# Patient Record
Sex: Female | Born: 1945
Health system: Southern US, Community
[De-identification: ages and names within clinical notes are randomized; demographics above are authoritative.]

## PROBLEM LIST (undated history)

## (undated) DIAGNOSIS — I714 Abdominal aortic aneurysm, without rupture, unspecified: Secondary | ICD-10-CM

## (undated) DIAGNOSIS — L720 Epidermal cyst: Secondary | ICD-10-CM

## (undated) DIAGNOSIS — S32010A Wedge compression fracture of first lumbar vertebra, initial encounter for closed fracture: Secondary | ICD-10-CM

## (undated) DIAGNOSIS — M8580 Other specified disorders of bone density and structure, unspecified site: Secondary | ICD-10-CM

## (undated) DIAGNOSIS — I480 Paroxysmal atrial fibrillation: Secondary | ICD-10-CM

## (undated) DIAGNOSIS — K5790 Diverticulosis of intestine, part unspecified, without perforation or abscess without bleeding: Secondary | ICD-10-CM

## (undated) DIAGNOSIS — R42 Dizziness and giddiness: Secondary | ICD-10-CM

## (undated) DIAGNOSIS — Z7982 Long term (current) use of aspirin: Secondary | ICD-10-CM

## (undated) DIAGNOSIS — I739 Peripheral vascular disease, unspecified: Secondary | ICD-10-CM

## (undated) DIAGNOSIS — Z720 Tobacco use: Secondary | ICD-10-CM

## (undated) DIAGNOSIS — D649 Anemia, unspecified: Secondary | ICD-10-CM

## (undated) DIAGNOSIS — Z7901 Long term (current) use of anticoagulants: Secondary | ICD-10-CM

## (undated) DIAGNOSIS — J449 Chronic obstructive pulmonary disease, unspecified: Secondary | ICD-10-CM

## (undated) DIAGNOSIS — I10 Essential (primary) hypertension: Secondary | ICD-10-CM

## (undated) DIAGNOSIS — I2510 Atherosclerotic heart disease of native coronary artery without angina pectoris: Secondary | ICD-10-CM

## (undated) DIAGNOSIS — G4700 Insomnia, unspecified: Secondary | ICD-10-CM

## (undated) DIAGNOSIS — J439 Emphysema, unspecified: Secondary | ICD-10-CM

## (undated) DIAGNOSIS — H6980 Other specified disorders of Eustachian tube, unspecified ear: Secondary | ICD-10-CM

## (undated) DIAGNOSIS — R634 Abnormal weight loss: Secondary | ICD-10-CM

## (undated) DIAGNOSIS — R451 Restlessness and agitation: Secondary | ICD-10-CM

## (undated) DIAGNOSIS — Z7902 Long term (current) use of antithrombotics/antiplatelets: Secondary | ICD-10-CM

## (undated) DIAGNOSIS — I700 Atherosclerosis of aorta: Secondary | ICD-10-CM

## (undated) DIAGNOSIS — E876 Hypokalemia: Secondary | ICD-10-CM

## (undated) DIAGNOSIS — H6990 Unspecified Eustachian tube disorder, unspecified ear: Secondary | ICD-10-CM

## (undated) DIAGNOSIS — K219 Gastro-esophageal reflux disease without esophagitis: Secondary | ICD-10-CM

## (undated) DIAGNOSIS — M47812 Spondylosis without myelopathy or radiculopathy, cervical region: Secondary | ICD-10-CM

## (undated) DIAGNOSIS — R911 Solitary pulmonary nodule: Secondary | ICD-10-CM

## (undated) DIAGNOSIS — I609 Nontraumatic subarachnoid hemorrhage, unspecified: Secondary | ICD-10-CM

## (undated) DIAGNOSIS — M5030 Other cervical disc degeneration, unspecified cervical region: Secondary | ICD-10-CM

## (undated) DIAGNOSIS — H8103 Meniere's disease, bilateral: Secondary | ICD-10-CM

## (undated) DIAGNOSIS — E785 Hyperlipidemia, unspecified: Secondary | ICD-10-CM

## (undated) HISTORY — PX: EYE SURGERY: SHX253

## (undated) HISTORY — PX: CATARACT EXTRACTION W/ INTRAOCULAR LENS  IMPLANT, BILATERAL: SHX1307

## (undated) HISTORY — PX: ENDOVASCULAR STENT GRAFT (AAA): CATH118280

## (undated) HISTORY — PX: TONSILLECTOMY: SUR1361

---

## 2015-11-30 DIAGNOSIS — M5030 Other cervical disc degeneration, unspecified cervical region: Secondary | ICD-10-CM | POA: Insufficient documentation

## 2015-11-30 DIAGNOSIS — Z720 Tobacco use: Secondary | ICD-10-CM | POA: Insufficient documentation

## 2016-02-14 DIAGNOSIS — J438 Other emphysema: Secondary | ICD-10-CM | POA: Insufficient documentation

## 2016-02-14 DIAGNOSIS — R911 Solitary pulmonary nodule: Secondary | ICD-10-CM | POA: Insufficient documentation

## 2016-06-23 DIAGNOSIS — H8103 Meniere's disease, bilateral: Secondary | ICD-10-CM | POA: Insufficient documentation

## 2018-07-16 DIAGNOSIS — R42 Dizziness and giddiness: Secondary | ICD-10-CM | POA: Diagnosis not present

## 2018-07-16 DIAGNOSIS — H903 Sensorineural hearing loss, bilateral: Secondary | ICD-10-CM | POA: Diagnosis not present

## 2018-07-16 DIAGNOSIS — H8109 Meniere's disease, unspecified ear: Secondary | ICD-10-CM | POA: Diagnosis not present

## 2018-07-16 DIAGNOSIS — H6980 Other specified disorders of Eustachian tube, unspecified ear: Secondary | ICD-10-CM | POA: Diagnosis not present

## 2018-07-31 DIAGNOSIS — R42 Dizziness and giddiness: Secondary | ICD-10-CM | POA: Diagnosis not present

## 2018-07-31 DIAGNOSIS — H8109 Meniere's disease, unspecified ear: Secondary | ICD-10-CM | POA: Diagnosis not present

## 2018-08-07 DIAGNOSIS — R42 Dizziness and giddiness: Secondary | ICD-10-CM | POA: Diagnosis not present

## 2018-08-07 DIAGNOSIS — H8109 Meniere's disease, unspecified ear: Secondary | ICD-10-CM | POA: Diagnosis not present

## 2018-08-14 DIAGNOSIS — R42 Dizziness and giddiness: Secondary | ICD-10-CM | POA: Diagnosis not present

## 2018-08-14 DIAGNOSIS — H8109 Meniere's disease, unspecified ear: Secondary | ICD-10-CM | POA: Diagnosis not present

## 2018-08-20 DIAGNOSIS — H6980 Other specified disorders of Eustachian tube, unspecified ear: Secondary | ICD-10-CM | POA: Diagnosis not present

## 2018-08-20 DIAGNOSIS — R42 Dizziness and giddiness: Secondary | ICD-10-CM | POA: Diagnosis not present

## 2018-08-20 DIAGNOSIS — H903 Sensorineural hearing loss, bilateral: Secondary | ICD-10-CM | POA: Diagnosis not present

## 2018-08-21 DIAGNOSIS — H8109 Meniere's disease, unspecified ear: Secondary | ICD-10-CM | POA: Diagnosis not present

## 2018-08-21 DIAGNOSIS — R42 Dizziness and giddiness: Secondary | ICD-10-CM | POA: Diagnosis not present

## 2018-09-04 DIAGNOSIS — R42 Dizziness and giddiness: Secondary | ICD-10-CM | POA: Diagnosis not present

## 2018-09-04 DIAGNOSIS — H8109 Meniere's disease, unspecified ear: Secondary | ICD-10-CM | POA: Diagnosis not present

## 2018-09-08 ENCOUNTER — Encounter: Payer: Self-pay | Admitting: Emergency Medicine

## 2018-09-08 ENCOUNTER — Emergency Department
Admission: EM | Admit: 2018-09-08 | Discharge: 2018-09-08 | Disposition: A | Discharge status: Home / Self Care | Payer: Medicare HMO | Attending: Emergency Medicine | Admitting: Emergency Medicine

## 2018-09-08 ENCOUNTER — Other Ambulatory Visit: Payer: Self-pay

## 2018-09-08 DIAGNOSIS — R220 Localized swelling, mass and lump, head: Secondary | ICD-10-CM | POA: Diagnosis not present

## 2018-09-08 DIAGNOSIS — K047 Periapical abscess without sinus: Secondary | ICD-10-CM | POA: Diagnosis not present

## 2018-09-08 DIAGNOSIS — K0889 Other specified disorders of teeth and supporting structures: Secondary | ICD-10-CM | POA: Diagnosis present

## 2018-09-08 DIAGNOSIS — F17200 Nicotine dependence, unspecified, uncomplicated: Secondary | ICD-10-CM | POA: Insufficient documentation

## 2018-09-08 HISTORY — DX: Dizziness and giddiness: R42

## 2018-09-08 MED ORDER — AMOXICILLIN 500 MG PO CAPS: 500.0000 mg | ORAL_CAPSULE | Freq: Three times a day (TID) | ORAL | 0 refills | Status: DC

## 2018-09-08 MED ORDER — AMOXICILLIN 500 MG PO CAPS
500.0000 mg | ORAL_CAPSULE | Freq: Once | ORAL | Status: AC
  Administered 2018-09-08: 500 mg via ORAL
  Filled 2018-09-08: qty 1

## 2018-09-08 NOTE — ED Triage Notes (Signed)
Pt to ED via POV, pt states that yesterday she started to have dental pain and now has swelling on the right side of the face. Pt does not appear to be in any distress at this time.

## 2018-09-08 NOTE — Discharge Instructions (Addendum)
You are being treated for a superficial dental infection. Take the antibiotic as directed. Apply warm compresses to promote healing. Rinse with warm-salty water daily. Follow-up with your dental provider as needed.

## 2018-09-08 NOTE — ED Notes (Signed)
Pt  Has  Facial swelling toothache  Started yest  Pt reports  No injury    Speaking in complete sentances in no acute distress

## 2018-09-10 NOTE — ED Provider Notes (Signed)
[facility] Emergency Department Provider Note ____________________________________________  Time seen: 1136  I have reviewed the triage vital signs and the nursing notes.  HISTORY  Chief Complaint  Dental Pain and Facial Swelling  HPI Autumn Le is a 72 y.o. female who presents herself to the ED for evaluation of sudden facial swelling and preceding dental pain.  Patient describes she began to experience some mild pain to the buccal mucosa of her right upper incisor.  When she awoke this morning she has swelling from the upper lip to the nasal labial fold towards the eye.  She denies any interim fevers, chills, or sweats.  She does note tenderness and small area of swelling at the mouth under the upper lip.  She is unclear of the source of the infections citing she has had a root canal and a placed on his primary incisor.  Denies any difficulty breathing, swallowing, or controlling secretions.  Past Medical History:  Diagnosis Date  . Vertigo     There are no active problems to display for this patient.   Past Surgical History:  Procedure Laterality Date  . TONSILLECTOMY      Prior to Admission medications   Medication Sig Start Date End Date Taking? Authorizing Provider  amoxicillin (AMOXIL) 500 MG capsule Take 1 capsule (500 mg total) by mouth 3 (three) times daily. 09/08/18   Dontel Harshberger, Charlesetta Ivory, PA-C    Allergies Patient has no known allergies.  No family history on file.  Social History Social History   Tobacco Use  . Smoking status: Current Every Day Smoker  . Smokeless tobacco: Never Used  Substance Use Topics  . Alcohol use: Not Currently  . Drug use: Not Currently    Review of Systems  Constitutional: Negative for fever. Eyes: Negative for visual changes. ENT: Negative for sore throat.  Facial swelling and dental pain as above.   Cardiovascular: Negative for chest pain. Respiratory: Negative for shortness of  breath. Gastrointestinal: Negative for abdominal pain, vomiting and diarrhea. Genitourinary: Negative for dysuria. Musculoskeletal: Negative for back pain. Skin: Negative for rash. Neurological: Negative for headaches, focal weakness or numbness. ____________________________________________  PHYSICAL EXAM:  VITAL SIGNS: ED Triage Vitals  Enc Vitals Group     BP 09/08/18 1040 118/70     Pulse Rate 09/08/18 1038 66     Resp 09/08/18 1038 16     Temp 09/08/18 1038 98 F (36.7 C)     Temp Source 09/08/18 1038 Oral     SpO2 09/08/18 1038 97 %     Weight 09/08/18 1040 124 lb (56.2 kg)     Height 09/08/18 1040 5\' 4"  (1.626 m)     Head Circumference --      Peak Flow --      Pain Score 09/08/18 1039 4     Pain Loc --      Pain Edu? --      Excl. in GC? --     Constitutional: Alert and oriented. Well appearing and in no distress. Head: Normocephalic and atraumatic.  Patient with obvious swelling noted to the right upper lip and extending through the nasolabial fold on the right. Eyes: Conjunctivae are normal. PERRL. Normal extraocular movements Ears: Canals clear. TMs intact bilaterally. Nose: No congestion/rhinorrhea/epistaxis. Mouth/Throat: Mucous membranes are moist.  Uvula is midline and tonsils are flat.  No oropharyngeal lesions are appreciated.  Patient does have a focal pustule to the proximal buccal fold on the right upper lip, over  the primary incisor. Neck: Supple. No thyromegaly. Hematological/Lymphatic/Immunological: No cervical lymphadenopathy. Cardiovascular: Normal rate, regular rhythm. Normal distal pulses. Respiratory: Normal respiratory effort. No wheezes/rales/rhonchi. Musculoskeletal: Nontender with normal range of motion in all extremities.  Neurologic:  Normal gait without ataxia. Normal speech and language. No gross focal neurologic deficits are appreciated. Skin:  Skin is warm, dry and intact. No rash  noted. ____________________________________________  PROCEDURES  Procedures Amoxicillin 500 mg p.o. ____________________________________________  INITIAL IMPRESSION / ASSESSMENT AND PLAN / ED COURSE  Patient with ED evaluation of sudden facial swelling and dental pain.  Patient's clinical picture is consistent with a focal superficial dental abscess.  She is treated empirically with amoxicillin.  She is advised to return warm water, and follow-up with a dental provider for further evaluation and management.  Return precautions have been reviewed. ____________________________________________  FINAL CLINICAL IMPRESSION(S) / ED DIAGNOSES  Final diagnoses:  Dental infection      Karmen Stabs, Charlesetta Ivory, PA-C 09/11/18 0001    Dionne Bucy, MD 09/12/18 1110

## 2018-09-11 DIAGNOSIS — R42 Dizziness and giddiness: Secondary | ICD-10-CM | POA: Diagnosis not present

## 2018-09-11 DIAGNOSIS — H8109 Meniere's disease, unspecified ear: Secondary | ICD-10-CM | POA: Diagnosis not present

## 2018-09-17 DIAGNOSIS — R42 Dizziness and giddiness: Secondary | ICD-10-CM | POA: Diagnosis not present

## 2018-09-17 DIAGNOSIS — H6980 Other specified disorders of Eustachian tube, unspecified ear: Secondary | ICD-10-CM | POA: Diagnosis not present

## 2018-09-18 DIAGNOSIS — H8109 Meniere's disease, unspecified ear: Secondary | ICD-10-CM | POA: Diagnosis not present

## 2018-09-25 DIAGNOSIS — H8109 Meniere's disease, unspecified ear: Secondary | ICD-10-CM | POA: Diagnosis not present

## 2018-09-25 DIAGNOSIS — R42 Dizziness and giddiness: Secondary | ICD-10-CM | POA: Diagnosis not present

## 2018-10-01 DIAGNOSIS — R42 Dizziness and giddiness: Secondary | ICD-10-CM | POA: Diagnosis not present

## 2018-10-01 DIAGNOSIS — H8109 Meniere's disease, unspecified ear: Secondary | ICD-10-CM | POA: Diagnosis not present

## 2018-10-09 DIAGNOSIS — H8109 Meniere's disease, unspecified ear: Secondary | ICD-10-CM | POA: Diagnosis not present

## 2018-10-09 DIAGNOSIS — R42 Dizziness and giddiness: Secondary | ICD-10-CM | POA: Diagnosis not present

## 2018-10-21 DIAGNOSIS — R42 Dizziness and giddiness: Secondary | ICD-10-CM | POA: Diagnosis not present

## 2018-10-21 DIAGNOSIS — H6980 Other specified disorders of Eustachian tube, unspecified ear: Secondary | ICD-10-CM | POA: Diagnosis not present

## 2018-12-04 DIAGNOSIS — J209 Acute bronchitis, unspecified: Secondary | ICD-10-CM | POA: Diagnosis not present

## 2019-01-08 ENCOUNTER — Other Ambulatory Visit: Payer: Self-pay | Admitting: Family Medicine

## 2019-01-08 DIAGNOSIS — J438 Other emphysema: Secondary | ICD-10-CM

## 2019-01-08 DIAGNOSIS — Z136 Encounter for screening for cardiovascular disorders: Secondary | ICD-10-CM | POA: Diagnosis not present

## 2019-01-08 DIAGNOSIS — M8588 Other specified disorders of bone density and structure, other site: Secondary | ICD-10-CM | POA: Diagnosis not present

## 2019-01-08 DIAGNOSIS — R911 Solitary pulmonary nodule: Secondary | ICD-10-CM | POA: Diagnosis not present

## 2019-01-08 DIAGNOSIS — F17200 Nicotine dependence, unspecified, uncomplicated: Secondary | ICD-10-CM

## 2019-01-09 ENCOUNTER — Other Ambulatory Visit: Payer: Self-pay | Admitting: Family Medicine

## 2019-01-09 DIAGNOSIS — Z1231 Encounter for screening mammogram for malignant neoplasm of breast: Secondary | ICD-10-CM

## 2019-01-22 ENCOUNTER — Other Ambulatory Visit: Payer: Self-pay

## 2019-01-22 ENCOUNTER — Ambulatory Visit
Admission: RE | Admit: 2019-01-22 | Discharge: 2019-01-22 | Disposition: A | Discharge status: Home / Self Care | Payer: Medicare HMO | Source: Ambulatory Visit | Attending: Family Medicine | Admitting: Family Medicine

## 2019-01-22 DIAGNOSIS — Z1231 Encounter for screening mammogram for malignant neoplasm of breast: Secondary | ICD-10-CM | POA: Diagnosis not present

## 2019-01-29 DIAGNOSIS — L728 Other follicular cysts of the skin and subcutaneous tissue: Secondary | ICD-10-CM | POA: Diagnosis not present

## 2019-01-29 DIAGNOSIS — H73002 Acute myringitis, left ear: Secondary | ICD-10-CM | POA: Diagnosis not present

## 2019-01-31 ENCOUNTER — Ambulatory Visit: Payer: Medicare HMO

## 2019-02-05 ENCOUNTER — Ambulatory Visit: Payer: Medicare HMO

## 2019-02-12 DIAGNOSIS — L728 Other follicular cysts of the skin and subcutaneous tissue: Secondary | ICD-10-CM | POA: Diagnosis not present

## 2019-02-12 DIAGNOSIS — H73009 Acute myringitis, unspecified ear: Secondary | ICD-10-CM | POA: Diagnosis not present

## 2019-04-08 ENCOUNTER — Ambulatory Visit
Admission: RE | Admit: 2019-04-08 | Discharge: 2019-04-08 | Disposition: A | Discharge status: Home / Self Care | Payer: Medicare HMO | Source: Ambulatory Visit | Attending: Family Medicine | Admitting: Family Medicine

## 2019-04-08 ENCOUNTER — Other Ambulatory Visit: Payer: Self-pay

## 2019-04-08 DIAGNOSIS — J438 Other emphysema: Secondary | ICD-10-CM | POA: Diagnosis not present

## 2019-04-08 DIAGNOSIS — R911 Solitary pulmonary nodule: Secondary | ICD-10-CM | POA: Diagnosis not present

## 2019-04-08 DIAGNOSIS — F17200 Nicotine dependence, unspecified, uncomplicated: Secondary | ICD-10-CM | POA: Diagnosis not present

## 2019-07-02 DIAGNOSIS — Z1159 Encounter for screening for other viral diseases: Secondary | ICD-10-CM | POA: Diagnosis not present

## 2019-07-02 DIAGNOSIS — Z0000 Encounter for general adult medical examination without abnormal findings: Secondary | ICD-10-CM | POA: Diagnosis not present

## 2019-07-02 DIAGNOSIS — F17200 Nicotine dependence, unspecified, uncomplicated: Secondary | ICD-10-CM | POA: Diagnosis not present

## 2019-07-02 DIAGNOSIS — J438 Other emphysema: Secondary | ICD-10-CM | POA: Diagnosis not present

## 2019-07-02 DIAGNOSIS — R911 Solitary pulmonary nodule: Secondary | ICD-10-CM | POA: Diagnosis not present

## 2019-07-02 DIAGNOSIS — E782 Mixed hyperlipidemia: Secondary | ICD-10-CM | POA: Diagnosis not present

## 2019-07-02 DIAGNOSIS — Z136 Encounter for screening for cardiovascular disorders: Secondary | ICD-10-CM | POA: Diagnosis not present

## 2019-07-09 DIAGNOSIS — Z1159 Encounter for screening for other viral diseases: Secondary | ICD-10-CM | POA: Diagnosis not present

## 2019-07-09 DIAGNOSIS — E782 Mixed hyperlipidemia: Secondary | ICD-10-CM | POA: Diagnosis not present

## 2019-07-09 DIAGNOSIS — Z0000 Encounter for general adult medical examination without abnormal findings: Secondary | ICD-10-CM | POA: Diagnosis not present

## 2019-07-09 DIAGNOSIS — Z66 Do not resuscitate: Secondary | ICD-10-CM | POA: Insufficient documentation

## 2019-08-12 DIAGNOSIS — R202 Paresthesia of skin: Secondary | ICD-10-CM | POA: Diagnosis not present

## 2019-09-11 DIAGNOSIS — H524 Presbyopia: Secondary | ICD-10-CM | POA: Diagnosis not present

## 2019-09-11 DIAGNOSIS — Z0100 Encounter for examination of eyes and vision without abnormal findings: Secondary | ICD-10-CM | POA: Diagnosis not present

## 2019-09-17 ENCOUNTER — Other Ambulatory Visit: Payer: Self-pay | Admitting: Family Medicine

## 2019-09-17 ENCOUNTER — Other Ambulatory Visit (HOSPITAL_COMMUNITY): Payer: Self-pay | Admitting: Family Medicine

## 2019-09-17 DIAGNOSIS — R911 Solitary pulmonary nodule: Secondary | ICD-10-CM

## 2019-11-03 DIAGNOSIS — E782 Mixed hyperlipidemia: Secondary | ICD-10-CM | POA: Diagnosis not present

## 2019-11-10 DIAGNOSIS — E782 Mixed hyperlipidemia: Secondary | ICD-10-CM | POA: Diagnosis not present

## 2019-11-10 DIAGNOSIS — J438 Other emphysema: Secondary | ICD-10-CM | POA: Diagnosis not present

## 2019-11-10 DIAGNOSIS — F17200 Nicotine dependence, unspecified, uncomplicated: Secondary | ICD-10-CM | POA: Diagnosis not present

## 2019-11-14 HISTORY — PX: BRAIN SURGERY: SHX531

## 2019-12-18 ENCOUNTER — Other Ambulatory Visit: Payer: Self-pay | Admitting: Family Medicine

## 2019-12-18 DIAGNOSIS — Z1231 Encounter for screening mammogram for malignant neoplasm of breast: Secondary | ICD-10-CM

## 2020-01-23 ENCOUNTER — Emergency Department: Payer: Medicare HMO

## 2020-01-23 ENCOUNTER — Encounter (HOSPITAL_COMMUNITY)
Admission: EM | Disposition: A | Discharge status: Rehabilitation Facility | Payer: Self-pay | Source: Other Acute Inpatient Hospital | Attending: Neurosurgery

## 2020-01-23 ENCOUNTER — Inpatient Hospital Stay (HOSPITAL_COMMUNITY)
Admission: EM | Admit: 2020-01-23 | Discharge: 2020-01-31 | DRG: 022 | Disposition: A | Discharge status: Rehabilitation Facility | Payer: Medicare HMO | Source: Other Acute Inpatient Hospital | Attending: Neurosurgery | Admitting: Neurosurgery

## 2020-01-23 ENCOUNTER — Emergency Department (HOSPITAL_COMMUNITY): Payer: Medicare HMO | Admitting: Registered Nurse

## 2020-01-23 ENCOUNTER — Encounter (HOSPITAL_COMMUNITY): Payer: Self-pay

## 2020-01-23 ENCOUNTER — Emergency Department
Admission: EM | Admit: 2020-01-23 | Discharge: 2020-01-23 | Disposition: A | Discharge status: Other Acute Inpatient Hospital | Payer: Medicare HMO | Attending: Emergency Medicine | Admitting: Emergency Medicine

## 2020-01-23 ENCOUNTER — Emergency Department (HOSPITAL_COMMUNITY): Payer: Medicare HMO

## 2020-01-23 ENCOUNTER — Other Ambulatory Visit: Payer: Self-pay

## 2020-01-23 DIAGNOSIS — S40012A Contusion of left shoulder, initial encounter: Secondary | ICD-10-CM | POA: Diagnosis present

## 2020-01-23 DIAGNOSIS — I607 Nontraumatic subarachnoid hemorrhage from unspecified intracranial artery: Secondary | ICD-10-CM | POA: Diagnosis not present

## 2020-01-23 DIAGNOSIS — R9431 Abnormal electrocardiogram [ECG] [EKG]: Secondary | ICD-10-CM | POA: Diagnosis not present

## 2020-01-23 DIAGNOSIS — M542 Cervicalgia: Secondary | ICD-10-CM | POA: Diagnosis not present

## 2020-01-23 DIAGNOSIS — D72829 Elevated white blood cell count, unspecified: Secondary | ICD-10-CM | POA: Diagnosis not present

## 2020-01-23 DIAGNOSIS — R7989 Other specified abnormal findings of blood chemistry: Secondary | ICD-10-CM

## 2020-01-23 DIAGNOSIS — E876 Hypokalemia: Secondary | ICD-10-CM | POA: Diagnosis not present

## 2020-01-23 DIAGNOSIS — I10 Essential (primary) hypertension: Secondary | ICD-10-CM | POA: Diagnosis not present

## 2020-01-23 DIAGNOSIS — I609 Nontraumatic subarachnoid hemorrhage, unspecified: Secondary | ICD-10-CM

## 2020-01-23 DIAGNOSIS — W19XXXA Unspecified fall, initial encounter: Secondary | ICD-10-CM | POA: Insufficient documentation

## 2020-01-23 DIAGNOSIS — R9430 Abnormal result of cardiovascular function study, unspecified: Secondary | ICD-10-CM | POA: Insufficient documentation

## 2020-01-23 DIAGNOSIS — I671 Cerebral aneurysm, nonruptured: Secondary | ICD-10-CM | POA: Diagnosis present

## 2020-01-23 DIAGNOSIS — I6523 Occlusion and stenosis of bilateral carotid arteries: Secondary | ICD-10-CM | POA: Diagnosis not present

## 2020-01-23 DIAGNOSIS — R531 Weakness: Secondary | ICD-10-CM | POA: Diagnosis not present

## 2020-01-23 DIAGNOSIS — F17210 Nicotine dependence, cigarettes, uncomplicated: Secondary | ICD-10-CM | POA: Diagnosis present

## 2020-01-23 DIAGNOSIS — Y999 Unspecified external cause status: Secondary | ICD-10-CM | POA: Diagnosis not present

## 2020-01-23 DIAGNOSIS — R778 Other specified abnormalities of plasma proteins: Secondary | ICD-10-CM | POA: Diagnosis not present

## 2020-01-23 DIAGNOSIS — D62 Acute posthemorrhagic anemia: Secondary | ICD-10-CM | POA: Diagnosis not present

## 2020-01-23 DIAGNOSIS — Z7951 Long term (current) use of inhaled steroids: Secondary | ICD-10-CM

## 2020-01-23 DIAGNOSIS — I6031 Nontraumatic subarachnoid hemorrhage from right posterior communicating artery: Secondary | ICD-10-CM

## 2020-01-23 DIAGNOSIS — Z20822 Contact with and (suspected) exposure to covid-19: Secondary | ICD-10-CM | POA: Diagnosis present

## 2020-01-23 DIAGNOSIS — Z79899 Other long term (current) drug therapy: Secondary | ICD-10-CM | POA: Diagnosis not present

## 2020-01-23 DIAGNOSIS — J449 Chronic obstructive pulmonary disease, unspecified: Secondary | ICD-10-CM | POA: Diagnosis present

## 2020-01-23 DIAGNOSIS — R451 Restlessness and agitation: Secondary | ICD-10-CM | POA: Diagnosis not present

## 2020-01-23 DIAGNOSIS — R0602 Shortness of breath: Secondary | ICD-10-CM | POA: Diagnosis not present

## 2020-01-23 DIAGNOSIS — S199XXA Unspecified injury of neck, initial encounter: Secondary | ICD-10-CM | POA: Diagnosis not present

## 2020-01-23 DIAGNOSIS — R55 Syncope and collapse: Secondary | ICD-10-CM

## 2020-01-23 DIAGNOSIS — R42 Dizziness and giddiness: Secondary | ICD-10-CM | POA: Diagnosis not present

## 2020-01-23 DIAGNOSIS — R5381 Other malaise: Secondary | ICD-10-CM | POA: Diagnosis not present

## 2020-01-23 DIAGNOSIS — I69098 Other sequelae following nontraumatic subarachnoid hemorrhage: Secondary | ICD-10-CM | POA: Diagnosis not present

## 2020-01-23 DIAGNOSIS — Y9389 Activity, other specified: Secondary | ICD-10-CM | POA: Diagnosis not present

## 2020-01-23 DIAGNOSIS — Y929 Unspecified place or not applicable: Secondary | ICD-10-CM | POA: Diagnosis not present

## 2020-01-23 DIAGNOSIS — F17200 Nicotine dependence, unspecified, uncomplicated: Secondary | ICD-10-CM | POA: Insufficient documentation

## 2020-01-23 DIAGNOSIS — S50311A Abrasion of right elbow, initial encounter: Secondary | ICD-10-CM | POA: Diagnosis not present

## 2020-01-23 DIAGNOSIS — I952 Hypotension due to drugs: Secondary | ICD-10-CM | POA: Diagnosis not present

## 2020-01-23 DIAGNOSIS — S066X9A Traumatic subarachnoid hemorrhage with loss of consciousness of unspecified duration, initial encounter: Secondary | ICD-10-CM | POA: Insufficient documentation

## 2020-01-23 DIAGNOSIS — S50312A Abrasion of left elbow, initial encounter: Secondary | ICD-10-CM | POA: Diagnosis not present

## 2020-01-23 DIAGNOSIS — I714 Abdominal aortic aneurysm, without rupture: Secondary | ICD-10-CM | POA: Diagnosis not present

## 2020-01-23 DIAGNOSIS — S066X0A Traumatic subarachnoid hemorrhage without loss of consciousness, initial encounter: Secondary | ICD-10-CM | POA: Diagnosis not present

## 2020-01-23 HISTORY — PX: IR ANGIOGRAM FOLLOW UP STUDY: IMG697

## 2020-01-23 HISTORY — PX: IR TRANSCATH/EMBOLIZ: IMG695

## 2020-01-23 HISTORY — PX: IR ANGIO VERTEBRAL SEL VERTEBRAL UNI L MOD SED: IMG5367

## 2020-01-23 HISTORY — DX: Meniere's disease, bilateral: H81.03

## 2020-01-23 HISTORY — DX: Nontraumatic subarachnoid hemorrhage from right posterior communicating artery: I60.31

## 2020-01-23 HISTORY — DX: Spondylosis without myelopathy or radiculopathy, cervical region: M47.812

## 2020-01-23 HISTORY — DX: Chronic obstructive pulmonary disease, unspecified: J44.9

## 2020-01-23 HISTORY — DX: Cerebral aneurysm, nonruptured: I67.1

## 2020-01-23 HISTORY — PX: IR ANGIO INTRA EXTRACRAN SEL INTERNAL CAROTID BILAT MOD SED: IMG5363

## 2020-01-23 HISTORY — PX: RADIOLOGY WITH ANESTHESIA: SHX6223

## 2020-01-23 HISTORY — DX: Other specified disorders of bone density and structure, unspecified site: M85.80

## 2020-01-23 LAB — URINALYSIS, COMPLETE (UACMP) WITH MICROSCOPIC
Bacteria, UA: NONE SEEN
Bilirubin Urine: NEGATIVE
Glucose, UA: NEGATIVE mg/dL
Ketones, ur: 20 mg/dL — AB
Nitrite: NEGATIVE
Protein, ur: NEGATIVE mg/dL
Specific Gravity, Urine: 1.04 — ABNORMAL HIGH (ref 1.005–1.030)
pH: 6 (ref 5.0–8.0)

## 2020-01-23 LAB — CBC
HCT: 38.5 % (ref 36.0–46.0)
HCT: 32.2 % — ABNORMAL LOW (ref 36.0–46.0)
Hemoglobin: 12.7 g/dL (ref 12.0–15.0)
Hemoglobin: 10.7 g/dL — ABNORMAL LOW (ref 12.0–15.0)
MCH: 31.7 pg (ref 26.0–34.0)
MCH: 32.5 pg (ref 26.0–34.0)
MCHC: 33 g/dL (ref 30.0–36.0)
MCHC: 33.2 g/dL (ref 30.0–36.0)
MCV: 96 fL (ref 80.0–100.0)
MCV: 97.9 fL (ref 80.0–100.0)
Platelets: 255 10*3/uL (ref 150–400)
Platelets: 217 10*3/uL (ref 150–400)
RBC: 4.01 MIL/uL (ref 3.87–5.11)
RBC: 3.29 MIL/uL — ABNORMAL LOW (ref 3.87–5.11)
RDW: 13.2 % (ref 11.5–15.5)
RDW: 13.2 % (ref 11.5–15.5)
WBC: 14.2 10*3/uL — ABNORMAL HIGH (ref 4.0–10.5)
WBC: 12.1 10*3/uL — ABNORMAL HIGH (ref 4.0–10.5)
nRBC: 0 % (ref 0.0–0.2)
nRBC: 0 % (ref 0.0–0.2)

## 2020-01-23 LAB — BASIC METABOLIC PANEL
Anion gap: 10 (ref 5–15)
BUN: 20 mg/dL (ref 8–23)
CO2: 20 mmol/L — ABNORMAL LOW (ref 22–32)
Calcium: 8.7 mg/dL — ABNORMAL LOW (ref 8.9–10.3)
Chloride: 105 mmol/L (ref 98–111)
Creatinine, Ser: 0.57 mg/dL (ref 0.44–1.00)
GFR calc Af Amer: >60 mL/min (ref >60)
GFR calc non Af Amer: >60 mL/min (ref >60)
Glucose, Bld: 140 mg/dL — ABNORMAL HIGH (ref 70–99)
Potassium: 3.6 mmol/L (ref 3.5–5.1)
Sodium: 135 mmol/L (ref 135–145)

## 2020-01-23 LAB — HEPATIC FUNCTION PANEL
ALT: 14 U/L (ref 0–44)
AST: 37 U/L (ref 15–41)
Albumin: 3.6 g/dL (ref 3.5–5.0)
Alkaline Phosphatase: 100 U/L (ref 38–126)
Bilirubin, Direct: 0.2 mg/dL (ref 0.0–0.2)
Indirect Bilirubin: 0.5 mg/dL (ref 0.3–0.9)
Total Bilirubin: 0.7 mg/dL (ref 0.3–1.2)
Total Protein: 7 g/dL (ref 6.5–8.1)

## 2020-01-23 LAB — TYPE AND SCREEN
ABO/RH(D): A POS
Antibody Screen: NEGATIVE

## 2020-01-23 LAB — TROPONIN I (HIGH SENSITIVITY)
Troponin I (High Sensitivity): 474 ng/L (ref <18)
Troponin I (High Sensitivity): 472 ng/L (ref <18)

## 2020-01-23 LAB — RESPIRATORY PANEL BY RT PCR (FLU A&B, COVID)
Influenza A by PCR: NEGATIVE
Influenza B by PCR: NEGATIVE
SARS Coronavirus 2 by RT PCR: NEGATIVE

## 2020-01-23 LAB — ABO/RH: ABO/RH(D): A POS

## 2020-01-23 LAB — APTT: aPTT: 49 seconds — ABNORMAL HIGH (ref 24–36)

## 2020-01-23 LAB — PROTIME-INR
INR: 1.1 (ref 0.8–1.2)
Prothrombin Time: 14.3 seconds (ref 11.4–15.2)

## 2020-01-23 LAB — MRSA PCR SCREENING: MRSA by PCR: NEGATIVE

## 2020-01-23 LAB — CK: Total CK: 458 U/L — ABNORMAL HIGH (ref 38–234)

## 2020-01-23 SURGERY — RADIOLOGY WITH ANESTHESIA
Anesthesia: General

## 2020-01-23 MED ORDER — SODIUM CHLORIDE 0.9 % IV BOLUS
1000.0000 mL | Freq: Once | INTRAVENOUS | Status: AC
  Administered 2020-01-23: 1000 mL via INTRAVENOUS

## 2020-01-23 MED ORDER — ACETAMINOPHEN 500 MG PO TABS
1000.0000 mg | ORAL_TABLET | Freq: Once | ORAL | Status: AC
  Administered 2020-01-23: 1000 mg via ORAL
  Filled 2020-01-23: qty 2

## 2020-01-23 MED ORDER — NICARDIPINE HCL IN NACL 20-0.86 MG/200ML-% IV SOLN
0.0000 mg/h | INTRAVENOUS | Status: DC
  Filled 2020-01-23: qty 200

## 2020-01-23 MED ORDER — NIMODIPINE 30 MG PO CAPS
60.0000 mg | ORAL_CAPSULE | Freq: Once | ORAL | Status: AC
  Administered 2020-01-23: 60 mg via ORAL
  Filled 2020-01-23: qty 2

## 2020-01-23 MED ORDER — CHLORHEXIDINE GLUCONATE CLOTH 2 % EX PADS
6.0000 | MEDICATED_PAD | Freq: Every day | CUTANEOUS | Status: DC
  Administered 2020-01-23 – 2020-01-30 (×6): 6 via TOPICAL

## 2020-01-23 MED ORDER — CEFAZOLIN SODIUM-DEXTROSE 2-3 GM-%(50ML) IV SOLR
INTRAVENOUS | Status: DC | PRN
  Administered 2020-01-23: 2 g via INTRAVENOUS

## 2020-01-23 MED ORDER — ONDANSETRON HCL 4 MG/2ML IJ SOLN
INTRAMUSCULAR | Status: DC | PRN
  Administered 2020-01-23: 4 mg via INTRAVENOUS

## 2020-01-23 MED ORDER — DEXAMETHASONE SODIUM PHOSPHATE 10 MG/ML IJ SOLN
INTRAMUSCULAR | Status: DC | PRN
  Administered 2020-01-23: 5 mg via INTRAVENOUS

## 2020-01-23 MED ORDER — NIMODIPINE 30 MG PO CAPS
60.0000 mg | ORAL_CAPSULE | ORAL | Status: DC
  Administered 2020-01-23 – 2020-01-25 (×12): 60 mg via ORAL
  Filled 2020-01-23 (×11): qty 2

## 2020-01-23 MED ORDER — LORATADINE 10 MG PO TABS
10.0000 mg | ORAL_TABLET | Freq: Every day | ORAL | Status: DC
  Administered 2020-01-24 – 2020-01-30 (×7): 10 mg via ORAL
  Filled 2020-01-23 (×7): qty 1

## 2020-01-23 MED ORDER — ONDANSETRON 4 MG PO TBDP
4.0000 mg | ORAL_TABLET | Freq: Four times a day (QID) | ORAL | Status: DC | PRN
  Administered 2020-01-27: 4 mg via ORAL
  Filled 2020-01-23: qty 1

## 2020-01-23 MED ORDER — FLUTICASONE FUROATE-VILANTEROL 100-25 MCG/INH IN AEPB
1.0000 | INHALATION_SPRAY | Freq: Every day | RESPIRATORY_TRACT | Status: DC
  Administered 2020-01-24 – 2020-01-31 (×7): 1 via RESPIRATORY_TRACT
  Filled 2020-01-23: qty 28

## 2020-01-23 MED ORDER — PANTOPRAZOLE SODIUM 40 MG PO PACK: 40.0000 mg | PACK | Freq: Every day | ORAL | Status: DC

## 2020-01-23 MED ORDER — FENTANYL CITRATE (PF) 250 MCG/5ML IJ SOLN
INTRAMUSCULAR | Status: DC | PRN
  Administered 2020-01-23: 100 ug via INTRAVENOUS

## 2020-01-23 MED ORDER — ROCURONIUM BROMIDE 10 MG/ML (PF) SYRINGE
PREFILLED_SYRINGE | INTRAVENOUS | Status: DC | PRN
  Administered 2020-01-23: 50 mg via INTRAVENOUS

## 2020-01-23 MED ORDER — LIDOCAINE 2% (20 MG/ML) 5 ML SYRINGE
INTRAMUSCULAR | Status: DC | PRN
  Administered 2020-01-23: 40 mg via INTRAVENOUS

## 2020-01-23 MED ORDER — LABETALOL HCL 5 MG/ML IV SOLN
20.0000 mg | Freq: Once | INTRAVENOUS | Status: AC
  Administered 2020-01-23: 20 mg via INTRAVENOUS
  Filled 2020-01-23: qty 4

## 2020-01-23 MED ORDER — ACETAMINOPHEN 325 MG PO TABS
650.0000 mg | ORAL_TABLET | ORAL | Status: DC | PRN
  Administered 2020-01-23 – 2020-01-26 (×4): 650 mg via ORAL
  Filled 2020-01-23 (×5): qty 2

## 2020-01-23 MED ORDER — NIMODIPINE 6 MG/ML PO SOLN
60.0000 mg | ORAL | Status: DC
  Filled 2020-01-23: qty 10

## 2020-01-23 MED ORDER — VITAMIN D 25 MCG (1000 UNIT) PO TABS
2000.0000 [IU] | ORAL_TABLET | Freq: Every day | ORAL | Status: DC
  Administered 2020-01-24 – 2020-01-30 (×7): 2000 [IU] via ORAL
  Filled 2020-01-23 (×7): qty 2

## 2020-01-23 MED ORDER — SUGAMMADEX SODIUM 200 MG/2ML IV SOLN
INTRAVENOUS | Status: DC | PRN
  Administered 2020-01-23 (×2): 100 mg via INTRAVENOUS

## 2020-01-23 MED ORDER — STROKE: EARLY STAGES OF RECOVERY BOOK
Freq: Once | Status: DC
  Filled 2020-01-23: qty 1

## 2020-01-23 MED ORDER — LACTATED RINGERS IV SOLN: INTRAVENOUS | Status: DC | PRN

## 2020-01-23 MED ORDER — PHENYLEPHRINE HCL-NACL 10-0.9 MG/250ML-% IV SOLN
INTRAVENOUS | Status: DC | PRN
  Administered 2020-01-23: 10 ug/min via INTRAVENOUS

## 2020-01-23 MED ORDER — ONDANSETRON HCL 4 MG/2ML IJ SOLN
4.0000 mg | Freq: Four times a day (QID) | INTRAMUSCULAR | Status: DC | PRN
  Administered 2020-01-26 – 2020-01-31 (×2): 4 mg via INTRAVENOUS
  Filled 2020-01-23 (×2): qty 2

## 2020-01-23 MED ORDER — PHENYLEPHRINE HCL-NACL 10-0.9 MG/250ML-% IV SOLN: INTRAVENOUS | Status: DC | PRN

## 2020-01-23 MED ORDER — ALBUTEROL SULFATE (2.5 MG/3ML) 0.083% IN NEBU: 2.5000 mg | INHALATION_SOLUTION | Freq: Four times a day (QID) | RESPIRATORY_TRACT | Status: DC | PRN

## 2020-01-23 MED ORDER — LACTATED RINGERS IV SOLN: INTRAVENOUS | Status: DC

## 2020-01-23 MED ORDER — IOHEXOL 300 MG/ML  SOLN
100.0000 mL | Freq: Once | INTRAMUSCULAR | Status: AC | PRN
  Administered 2020-01-23: 60 mL via INTRA_ARTERIAL

## 2020-01-23 MED ORDER — FLUTICASONE PROPIONATE 50 MCG/ACT NA SUSP
2.0000 | Freq: Every day | NASAL | Status: DC
  Administered 2020-01-24 – 2020-01-31 (×7): 2 via NASAL
  Filled 2020-01-23: qty 16

## 2020-01-23 MED ORDER — CALCIUM CARBONATE 1250 (500 CA) MG PO TABS
500.0000 mg | ORAL_TABLET | Freq: Every day | ORAL | Status: DC
  Administered 2020-01-24 – 2020-01-30 (×7): 500 mg via ORAL
  Filled 2020-01-23 (×10): qty 1

## 2020-01-23 MED ORDER — ACETAMINOPHEN 650 MG RE SUPP: 650.0000 mg | RECTAL | Status: DC | PRN

## 2020-01-23 MED ORDER — PANTOPRAZOLE SODIUM 40 MG PO TBEC
40.0000 mg | DELAYED_RELEASE_TABLET | Freq: Every day | ORAL | Status: DC
  Administered 2020-01-24 – 2020-01-31 (×8): 40 mg via ORAL
  Filled 2020-01-23 (×8): qty 1

## 2020-01-23 MED ORDER — CEFAZOLIN SODIUM-DEXTROSE 2-4 GM/100ML-% IV SOLN
INTRAVENOUS | Status: AC
  Filled 2020-01-23: qty 100

## 2020-01-23 MED ORDER — SODIUM CHLORIDE 0.9 % IV SOLN: INTRAVENOUS | Status: DC

## 2020-01-23 MED ORDER — PROPOFOL 10 MG/ML IV BOLUS
INTRAVENOUS | Status: DC | PRN
  Administered 2020-01-23: 90 mg via INTRAVENOUS

## 2020-01-23 MED ORDER — IOHEXOL 350 MG/ML SOLN
75.0000 mL | Freq: Once | INTRAVENOUS | Status: AC | PRN
  Administered 2020-01-23: 75 mL via INTRAVENOUS

## 2020-01-23 MED ORDER — ACETAMINOPHEN 160 MG/5ML PO SOLN: 650.0000 mg | ORAL | Status: DC | PRN

## 2020-01-23 MED ORDER — DOCUSATE SODIUM 100 MG PO CAPS
100.0000 mg | ORAL_CAPSULE | Freq: Two times a day (BID) | ORAL | Status: DC
  Administered 2020-01-23 – 2020-01-31 (×15): 100 mg via ORAL
  Filled 2020-01-23 (×15): qty 1

## 2020-01-23 NOTE — ED Provider Notes (Signed)
[facility]GENCY [facility] Provider Note   CSN: [id number] Arrival date & time: 01/23/20  1414     History Chief Complaint  Patient presents with  . Neurologic Problem    Nelda MarseilleRegina M Wedig is a 74 y.o. female.  Patient is a 74 year old female who presents as a transfer [facility] after having an noted subarachnoid hemorrhage on CT with associated aneurysm.  She had presented to the ED after a fall.  She does not remember the fall.  She says that she woke up on her bed yesterday morning with abrasions to her elbows and bruising to her shoulder.  She is not sure what happened.  She tells me that he came into the emergency room today because her friend made her come.  She has a mild headache and some nausea but otherwise denies any symptoms.  She denies any pain or injuries from the fall.  She has some abrasions to her elbows and bruising to her left shoulder but denies any underlying pain in these areas.  She was noted to have an elevated troponin but denies any chest pain or shortness of breath.        Past Medical History:  Diagnosis Date  . Vertigo     There are no problems to display for this patient.   Past Surgical History:  Procedure Laterality Date  . TONSILLECTOMY       OB History   No obstetric history on file.     Family History  Problem Relation Age of Onset  . Breast cancer Maternal Aunt     Social History   Tobacco Use  . Smoking status: Current Every Day Smoker  . Smokeless tobacco: Never Used  Substance Use Topics  . Alcohol use: Not Currently  . Drug use: Not Currently    Home Medications Prior to Admission medications   Medication Sig Start Date End Date Taking? Authorizing Provider  acetaminophen (TYLENOL) 325 MG tablet Take 650 mg by mouth every 6 (six) hours as needed for headache (pain).   Yes [provider]  albuterol (VENTOLIN HFA) 108 (90 Base) MCG/ACT inhaler Inhale 2 puffs into  the lungs every 6 (six) hours as needed for wheezing or shortness of breath.  06/24/18  Yes [provider]  Calcium Carbonate (CALCIUM 600 PO) Take 600 mg by mouth at bedtime.   Yes [provider]  Cholecalciferol 50 MCG (2000 UT) CAPS Take 2,000 Units by mouth at bedtime.    Yes [provider]  fluticasone (FLONASE) 50 MCG/ACT nasal spray Place 2 sprays into both nostrils daily.  09/09/16  Yes [provider]  fluticasone furoate-vilanterol (BREO ELLIPTA) 100-25 MCG/INH AEPB Inhale 1 puff into the lungs daily.   Yes [provider]  loratadine (CLARITIN) 10 MG tablet Take 10 mg by mouth at bedtime.  08/12/16  Yes [provider]    Allergies    Patient has no known allergies.  Review of Systems   Review of Systems  Constitutional: Positive for fatigue. Negative for chills, diaphoresis and fever.  HENT: Negative for congestion, rhinorrhea and sneezing.   Eyes: Negative.   Respiratory: Negative for cough, chest tightness and shortness of breath.   Cardiovascular: Negative for chest pain and leg swelling.  Gastrointestinal: Positive for nausea. Negative for abdominal pain, blood in stool, diarrhea and vomiting.  Genitourinary: Negative for difficulty urinating, flank pain, frequency and hematuria.  Musculoskeletal: Negative for arthralgias and back pain.  Skin: Positive for  wound. Negative for rash.  Neurological: Positive for headaches. Negative for dizziness, speech difficulty, weakness and numbness.    Physical Exam Updated Vital Signs BP (!) 122/57   Pulse 63   Temp (!) 97.3 F (36.3 C) (Oral)   Resp (!) 21   Ht 5\' 4"  (1.626 m)   Wt 55.3 kg   SpO2 98%   BMI 20.94 kg/m   Physical Exam Constitutional:      Appearance: She is well-developed.  HENT:     Head: Normocephalic and atraumatic.  Cardiovascular:     Rate and Rhythm: Normal rate and regular rhythm.     Heart sounds: Normal heart sounds.  Pulmonary:      Effort: Pulmonary effort is normal. No respiratory distress.     Breath sounds: Normal breath sounds. No wheezing or rales.  Chest:     Chest wall: No tenderness.  Abdominal:     General: Bowel sounds are normal.     Palpations: Abdomen is soft.     Tenderness: There is no abdominal tenderness. There is no guarding or rebound.  Musculoskeletal:        General: Normal range of motion.     Cervical back: Normal range of motion and neck supple.     Comments: She has some abrasions to her left elbow and some ecchymosis to her left shoulder.  No underlying bony tenderness  Lymphadenopathy:     Cervical: No cervical adenopathy.  Skin:    General: Skin is warm and dry.     Findings: No rash.  Neurological:     General: No focal deficit present.     Mental Status: She is alert and oriented to person, place, and time.     ED Results / Procedures / Treatments   Labs (all labs ordered are listed, but only abnormal results are displayed) Labs Reviewed - No data to display  EKG None  Radiology CT Angio Head W or Wo Contrast  Result Date: 01/23/2020 CLINICAL DATA:  Dizziness EXAM: CT ANGIOGRAPHY HEAD AND NECK TECHNIQUE: Multidetector CT imaging of the head and neck was performed using the standard protocol during bolus administration of intravenous contrast. Multiplanar CT image reconstructions and MIPs were obtained to evaluate the vascular anatomy. Carotid stenosis measurements (when applicable) are obtained utilizing NASCET criteria, using the distal internal carotid diameter as the denominator. CONTRAST:  5mL OMNIPAQUE IOHEXOL 350 MG/ML SOLN COMPARISON:  None. FINDINGS: CTA NECK FINDINGS Aortic arch: Calcified and irregular noncalcified plaque is present along the aortic arch and at the patent great vessel origins. Right carotid system: Patent. Calcified and noncalcified plaque at the ICA origin causes less than 50% stenosis. Left carotid system: Patent. Mild to moderate stenosis of the  common carotid origin. Atherosclerotic wall thickening along the remainder of the common carotid. Calcified and noncalcified plaque at the bifurcation and along the proximal carotid with less than 50% stenosis. Vertebral arteries: Patent.  Left vertebral artery is dominant. Skeleton: Cervical spine dictated separately. Other neck: No mass or adenopathy. Upper chest: Emphysema. Review of the MIP images confirms the above findings CTA HEAD FINDINGS Anterior circulation: Intracranial internal carotid arteries are patent. Anterior and middle cerebral arteries are patent. There is in inferolaterally directed aneurysm of the distal supraclinoid right ICA likely contiguous with the posterior communicating artery origin measuring approximately 8.6 x 5.3 mm axially. Neck measures 2.8 mm. Second aneurysm of the right A2 ACA measuring approximately 2.5 x 3.6 mm axially. Posterior circulation: Intracranial right vertebral artery becomes markedly  diminutive after PICA origin. Intracranial left vertebral arteries patent with minimal calcified plaque. Basilar artery is patent. Posterior cerebral arteries are patent. Venous sinuses: As permitted by contrast timing, patent. Anatomic variants: Fetal origin of the right PCA. Review of the MIP images confirms the above findings IMPRESSION: Approximately 9 mm aneurysm of the communicating segment of the right ICA. Likely culprit aneurysm given distribution of hemorrhage on noncontrast CT. Additional approximately 4 mm aneurysm of the right A2 ACA. Carotid atherosclerosis with less than 50% stenosis of the proximal internal carotids. These results were called by telephone at the time of interpretation on 01/23/2020 at 12:09 pm to provider [facility] , who verbally acknowledged these results. Electronically Signed   By: Guadlupe Spanish M.D.   On: 01/23/2020 12:28   CT Head Wo Contrast  Result Date: 01/23/2020 CLINICAL DATA:  Head trauma, headache EXAM: CT HEAD WITHOUT CONTRAST CT  CERVICAL SPINE WITHOUT CONTRAST TECHNIQUE: Multidetector CT imaging of the head and cervical spine was performed following the standard protocol without intravenous contrast. Multiplanar CT image reconstructions of the cervical spine were also generated. COMPARISON:  None. FINDINGS: CT HEAD FINDINGS Brain: There is subarachnoid hemorrhage about the right cerebral hemisphere and basal right frontal and right temporal lobes, as well as within the basal cisterns. No significant mass effect or midline shift. Small, incidental densely calcified right frontal meningioma. Parenchymal hypodensity of the right temporal pole. Vascular: No hyperdense vessel or unexpected calcification. Skull: Normal. Negative for fracture or focal lesion. Sinuses/Orbits: No acute finding. Other: None. CT CERVICAL SPINE FINDINGS Alignment: Normal. Skull base and vertebrae: No acute fracture. No primary bone lesion or focal pathologic process. Soft tissues and spinal canal: No prevertebral fluid or swelling. No visible canal hematoma. Disc levels: Mild moderate to severe disc space height loss and osteophytosis of C5-C6 and C6-C7 with otherwise mild disc space height loss. Upper chest: Negative. Other: None. IMPRESSION: 1. Extensive subarachnoid hemorrhage about the right cerebral hemisphere and basal cisterns. No significant mass effect or midline shift. This is of uncertain etiology, traumatic versus aneurysmal. No overlying skull fracture. Please see forthcoming CT angiogram. 2. Parenchymal hypodensity of the right temporal pole, of uncertain etiology, traumatic versus infarction. 3. No fracture or static subluxation of the cervical spine. These results were called by telephone at the time of interpretation on 01/23/2020 at 12:07 pm to provider [facility] , who verbally acknowledged these results. Electronically Signed   By: Lauralyn Primes M.D.   On: 01/23/2020 12:08   CT Angio Neck W and/or Wo Contrast  Result Date: 01/23/2020 CLINICAL  DATA:  Dizziness EXAM: CT ANGIOGRAPHY HEAD AND NECK TECHNIQUE: Multidetector CT imaging of the head and neck was performed using the standard protocol during bolus administration of intravenous contrast. Multiplanar CT image reconstructions and MIPs were obtained to evaluate the vascular anatomy. Carotid stenosis measurements (when applicable) are obtained utilizing NASCET criteria, using the distal internal carotid diameter as the denominator. CONTRAST:  43mL OMNIPAQUE IOHEXOL 350 MG/ML SOLN COMPARISON:  None. FINDINGS: CTA NECK FINDINGS Aortic arch: Calcified and irregular noncalcified plaque is present along the aortic arch and at the patent great vessel origins. Right carotid system: Patent. Calcified and noncalcified plaque at the ICA origin causes less than 50% stenosis. Left carotid system: Patent. Mild to moderate stenosis of the common carotid origin. Atherosclerotic wall thickening along the remainder of the common carotid. Calcified and noncalcified plaque at the bifurcation and along the proximal carotid with less than 50% stenosis. Vertebral arteries: Patent.  Left vertebral artery is dominant. Skeleton: Cervical spine dictated separately. Other neck: No mass or adenopathy. Upper chest: Emphysema. Review of the MIP images confirms the above findings CTA HEAD FINDINGS Anterior circulation: Intracranial internal carotid arteries are patent. Anterior and middle cerebral arteries are patent. There is in inferolaterally directed aneurysm of the distal supraclinoid right ICA likely contiguous with the posterior communicating artery origin measuring approximately 8.6 x 5.3 mm axially. Neck measures 2.8 mm. Second aneurysm of the right A2 ACA measuring approximately 2.5 x 3.6 mm axially. Posterior circulation: Intracranial right vertebral artery becomes markedly diminutive after PICA origin. Intracranial left vertebral arteries patent with minimal calcified plaque. Basilar artery is patent. Posterior cerebral  arteries are patent. Venous sinuses: As permitted by contrast timing, patent. Anatomic variants: Fetal origin of the right PCA. Review of the MIP images confirms the above findings IMPRESSION: Approximately 9 mm aneurysm of the communicating segment of the right ICA. Likely culprit aneurysm given distribution of hemorrhage on noncontrast CT. Additional approximately 4 mm aneurysm of the right A2 ACA. Carotid atherosclerosis with less than 50% stenosis of the proximal internal carotids. These results were called by telephone at the time of interpretation on 01/23/2020 at 12:09 pm to provider [facility] , who verbally acknowledged these results. Electronically Signed   By: Macy Mis M.D.   On: 01/23/2020 12:28   CT Angio Chest PE W and/or Wo Contrast  Result Date: 01/23/2020 CLINICAL DATA:  Shortness of breath.  Fell yesterday. EXAM: CT ANGIOGRAPHY CHEST WITH CONTRAST TECHNIQUE: Multidetector CT imaging of the chest was performed using the standard protocol during bolus administration of intravenous contrast. Multiplanar CT image reconstructions and MIPs were obtained to evaluate the vascular anatomy. CONTRAST:  16mL OMNIPAQUE IOHEXOL 350 MG/ML SOLN COMPARISON:  04/08/2019 FINDINGS: Cardiovascular: Pulmonary arterial opacification is excellent. There are no pulmonary emboli. Heart size is normal. No pericardial fluid. No visible coronary artery calcification. Minimal aortic atherosclerotic calcification. Mediastinum/Nodes: No mediastinal or hilar mass or lymphadenopathy. Lungs/Pleura: Emphysema. Apical pleural and parenchymal scarring bilaterally which is stable. Previously seen 6 mm nodule in the right upper lobe has resolved, indicating that it was inflammatory. No other focal lung finding. Upper Abdomen: 1 cm cyst in the ventral liver. No significant upper abdominal finding. Musculoskeletal: Negative Review of the MIP images confirms the above findings. IMPRESSION: No pulmonary emboli or other acute chest  finding. Resolution of a previously seen 6 mm nodule in the right upper lobe, indicating that it was inflammatory. Aortic Atherosclerosis (ICD10-I70.0) and Emphysema (ICD10-J43.9). Electronically Signed   By: Nelson Chimes M.D.   On: 01/23/2020 12:05   CT Cervical Spine Wo Contrast  Result Date: 01/23/2020 CLINICAL DATA:  Head trauma, headache EXAM: CT HEAD WITHOUT CONTRAST CT CERVICAL SPINE WITHOUT CONTRAST TECHNIQUE: Multidetector CT imaging of the head and cervical spine was performed following the standard protocol without intravenous contrast. Multiplanar CT image reconstructions of the cervical spine were also generated. COMPARISON:  None. FINDINGS: CT HEAD FINDINGS Brain: There is subarachnoid hemorrhage about the right cerebral hemisphere and basal right frontal and right temporal lobes, as well as within the basal cisterns. No significant mass effect or midline shift. Small, incidental densely calcified right frontal meningioma. Parenchymal hypodensity of the right temporal pole. Vascular: No hyperdense vessel or unexpected calcification. Skull: Normal. Negative for fracture or focal lesion. Sinuses/Orbits: No acute finding. Other: None. CT CERVICAL SPINE FINDINGS Alignment: Normal. Skull base and vertebrae: No acute fracture. No primary bone lesion or focal pathologic process. Soft tissues  and spinal canal: No prevertebral fluid or swelling. No visible canal hematoma. Disc levels: Mild moderate to severe disc space height loss and osteophytosis of C5-C6 and C6-C7 with otherwise mild disc space height loss. Upper chest: Negative. Other: None. IMPRESSION: 1. Extensive subarachnoid hemorrhage about the right cerebral hemisphere and basal cisterns. No significant mass effect or midline shift. This is of uncertain etiology, traumatic versus aneurysmal. No overlying skull fracture. Please see forthcoming CT angiogram. 2. Parenchymal hypodensity of the right temporal pole, of uncertain etiology, traumatic  versus infarction. 3. No fracture or static subluxation of the cervical spine. These results were called by telephone at the time of interpretation on 01/23/2020 at 12:07 pm to provider [facility] , who verbally acknowledged these results. Electronically Signed   By: Lauralyn Primes M.D.   On: 01/23/2020 12:08    Procedures Procedures (including critical care time)  Medications Ordered in ED Medications - No data to display  ED Course  I have reviewed the triage vital signs and the nursing notes.  Pertinent labs & imaging results that were available during my care of the patient were reviewed by me and considered in my medical decision making (see chart for details).    MDM Rules/Calculators/A&P                      Dr. Franky Macho  Notified of pt's arrival.  Pt taken to IR. Final Clinical Impression(s) / ED Diagnoses Final diagnoses:  SAH (subarachnoid hemorrhage) (HCC)    Rx / DC Orders ED Discharge Orders    None       Rolan Bucco, MD 01/23/20 1535

## 2020-01-23 NOTE — ED Notes (Signed)
[facility]  CALLED  FOR  TRANSFER  PER  DR  FUNKE MD 

## 2020-01-23 NOTE — Anesthesia Preprocedure Evaluation (Addendum)
Anesthesia Evaluation  Patient identified by MRN, date of birth, ID band Patient awake    Reviewed: Allergy & Precautions, NPO status , Patient's Chart, lab work & pertinent test results  Airway Mallampati: II  TM Distance: >3 FB Neck ROM: Full    Dental  (+) Teeth Intact, Dental Advisory Given   Pulmonary Current Smoker,    breath sounds clear to auscultation       Cardiovascular  Rhythm:Regular Rate:Normal     Neuro/Psych    GI/Hepatic   Endo/Other    Renal/GU      Musculoskeletal   Abdominal   Peds  Hematology   Anesthesia Other Findings   Reproductive/Obstetrics                            Anesthesia Physical Anesthesia Plan  ASA: IV  Anesthesia Plan: General   Post-op Pain Management:    Induction:   PONV Risk Score and Plan: Ondansetron and Dexamethasone  Airway Management Planned: Oral ETT  Additional Equipment: Arterial line  Intra-op Plan:   Post-operative Plan: Possible Post-op intubation/ventilation  Informed Consent: I have reviewed the patients History and Physical, chart, labs and discussed the procedure including the risks, benefits and alternatives for the proposed anesthesia with the patient or authorized representative who has indicated his/her understanding and acceptance.     Dental advisory given  Plan Discussed with: CRNA and Anesthesiologist  Anesthesia Plan Comments:         Anesthesia Quick Evaluation

## 2020-01-23 NOTE — ED Notes (Signed)
Repeat trop and covid collected by this RN. Pt tolerated well. Lights dimmed for patient comfort. Pt noted to be less confused than upon arrival. Explained to patient awaiting results of CT scans.

## 2020-01-23 NOTE — ED Notes (Signed)
EMTALA reviewed. 

## 2020-01-23 NOTE — ED Triage Notes (Addendum)
Pt brought to [facility] via [facility] from [facility] to be seen by Dr. Franky Macho. [facility] workup revealed a subarachnoid hemorrhage, 52mm aneurysm, and 71mm aneurysm. Pt currently A&Ox4 but states she does not remember falling or having an accident yesterday to cause her injuries. Bruising present on left shoulder, left elbow, and right hand. VSS.

## 2020-01-23 NOTE — ED Notes (Signed)
Pt transported to CT ?

## 2020-01-23 NOTE — H&P (Signed)
Chief Complaint   Chief Complaint  Patient presents with  . Neurologic Problem    History of Present Illness  Autumn Le is a 74 y.o. female initially presented to [facility] after waking up disoriented.  She says she went to bed Wednesday night, and essentially does not remember all day yesterday or today.  Because of her memory loss she presented to the emergency department.  At the current time she complains of mild headache, and does not remember whether she had a severe headache at the time of going to bed on Wednesday.  She does not complain of any changes in her vision, or numbness, tingling, or weakness in the arms or legs.  The patient has no significant past medical history with the exception of COPD for which she takes an inhaler on a daily basis.  Of note, she does smoke approximately 1 pack of cigarettes a day.  There is no known family history of intracranial aneurysms or intracranial hemorrhage.  Past Medical History   Past Medical History:  Diagnosis Date  . Vertigo     Past Surgical History   Past Surgical History:  Procedure Laterality Date  . TONSILLECTOMY      Social History   Social History   Tobacco Use  . Smoking status: Current Every Day Smoker  . Smokeless tobacco: Never Used  Substance Use Topics  . Alcohol use: Not Currently  . Drug use: Not Currently    Medications   Prior to Admission medications   Medication Sig Start Date End Date Taking? Authorizing Provider  acetaminophen (TYLENOL) 325 MG tablet Take 650 mg by mouth every 6 (six) hours as needed for headache (pain).   Yes [provider]  albuterol (VENTOLIN HFA) 108 (90 Base) MCG/ACT inhaler Inhale 2 puffs into the lungs every 6 (six) hours as needed for wheezing or shortness of breath.  06/24/18  Yes [provider]  Calcium Carbonate (CALCIUM 600 PO) Take 600 mg by mouth at bedtime.   Yes [provider]  Cholecalciferol 50 MCG (2000 UT)  CAPS Take 2,000 Units by mouth at bedtime.    Yes [provider]  fluticasone (FLONASE) 50 MCG/ACT nasal spray Place 2 sprays into both nostrils daily.  09/09/16  Yes [provider]  fluticasone furoate-vilanterol (BREO ELLIPTA) 100-25 MCG/INH AEPB Inhale 1 puff into the lungs daily.   Yes [provider]  loratadine (CLARITIN) 10 MG tablet Take 10 mg by mouth at bedtime.  08/12/16  Yes [provider]    Allergies  No Known Allergies  Review of Systems  ROS  Neurologic Exam  Awake, alert, oriented Memory and concentration grossly intact Speech fluent, appropriate CN grossly intact Motor exam: Upper Extremities Deltoid Bicep Tricep Grip  Right 5/5 5/5 5/5 5/5  Left 5/5 5/5 5/5 5/5   Lower Extremities IP Quad PF DF EHL  Right 5/5 5/5 5/5 5/5 5/5  Left 5/5 5/5 5/5 5/5 5/5   Sensation grossly intact to LT  Imaging  CT of the head was personally reviewed.  This demonstrates diffuse basal subarachnoid hemorrhage eccentric to the right including the right sylvian fissure.  There is also some hypodensity within the right mesial temporal lobe in the temporal pole.  CT angiogram was also personally reviewed.  This demonstrates a posterior and laterally projecting approximately 9 mm right posterior communicating artery aneurysm.  There is also a smaller approximately 3 to 4 mm right A2 A3 junction aneurysm.  Impression  -  74 y.o. female [place] d#2, Hunt-[place] 1 with 9 mm right posterior communicating artery aneurysm as a likely source of hemorrhage.  She has an unruptured distal right ACA aneurysm as well.  Plan  -We will plan on proceeding with diagnostic cerebral angiogram and treatment of the right posterior communicating artery aneurysm.  I reviewed the imaging findings with the patient.  Need for further characterization of the aneurysm with diagnostic cerebral angiogram was discussed.  We also discussed potential treatment modalities for the  right-sided aneurysm, and the fact that we would likely leave the unruptured aneurysm for treatment at a later date.  We discussed endovascular coiling and the risks associated with this procedure including the risk of stroke or bleeding, reaction to contrast, nephropathy, and groin hematoma.  We also discussed the overall likelihood of each of these risks.  We discussed the surgical clipping procedure and the risks associated with this including stroke or bleeding, infection, seizure, and hydrocephalus.  Patient appeared to understand our discussion and is willing to proceed with the plan above.  All her questions today were answered.  She provided verbal and written consent to proceed.

## 2020-01-23 NOTE — ED Notes (Signed)
Pt assisted to the bathroom by this RN prior to getting on [facility]'s stretcher. UA collected by this RN.

## 2020-01-23 NOTE — Anesthesia Procedure Notes (Signed)
Arterial Line Insertion Start/End3/10/2020 4:50 PM, 01/23/2020 4:55 PM Performed by: Kipp Brood, MD, anesthesiologist  Preanesthetic checklist: patient identified, IV checked, surgical consent, monitors and equipment checked and pre-op evaluation Right, radial was placed Catheter size: 20 G Hand hygiene performed  and maximum sterile barriers used   Attempts: 1 Procedure performed without using ultrasound guided technique. Ultrasound Notes:anatomy identified Following insertion, dressing applied and Biopatch.

## 2020-01-23 NOTE — Anesthesia Procedure Notes (Signed)
Procedure Name: Intubation Date/Time: 01/23/2020 4:44 PM Performed by: Trinna Post., CRNA Pre-anesthesia Checklist: Patient identified, Emergency Drugs available, Suction available and Patient being monitored Patient Re-evaluated:Patient Re-evaluated prior to induction Oxygen Delivery Method: Circle system utilized Preoxygenation: Pre-oxygenation with 100% oxygen Induction Type: IV induction Ventilation: Mask ventilation without difficulty Laryngoscope Size: Mac and 4 Grade View: Grade I Tube type: Oral Tube size: 7.0 mm Number of attempts: 1 Airway Equipment and Method: Stylet and Oral airway Placement Confirmation: ETT inserted through vocal cords under direct vision,  positive ETCO2 and breath sounds checked- equal and bilateral Secured at: 21 cm Tube secured with: Tape Dental Injury: Teeth and Oropharynx as per pre-operative assessment

## 2020-01-23 NOTE — Brief Op Note (Signed)
  NEUROSURGERY BRIEF OPERATIVE  NOTE   PREOP DX: Subarachnoid Hemorrhage  POSTOP DX: Same  PROCEDURE: Diagnostic cerebral angiogram, coil embolization of right posterior communicating artery aneurysm  SURGEON: Dr. Lisbeth Renshaw, MD  ANESTHESIA: GETA  EBL: Minimal  SPECIMENS: None  COMPLICATIONS: None  CONDITION: Stable to recovery  FINDINGS (Full report in [facility]): 1. Successful coil embolization of right posterior communicating artery aneurysm without residual post-embolization. Fetal-type PCA remains patent post embolization 2. Approximately 12mm right A2-3 aneurysm 3. Approximately 60mm cavernous right ICA aneurysm

## 2020-01-23 NOTE — ED Notes (Signed)
Pt visualized in NAD, pt resting in bed at this time with lights dimmed for comfort. Denies any needs, states understanding to use call bell if any needs arise. Explained awaiting [facility] to come and transfer patient. Pt states understanding.

## 2020-01-23 NOTE — ED Triage Notes (Addendum)
Pt states that she must have fallen yesterday and has no memory of it, saw blood on the bed and has abrasions to her elbows and her hands, her neighbor stopped today and called her sister for her to bring to the Er. Pt is c/o dizziness at this time, denies pain or having anything like this in the past. Pt reports completed covid 2nd vaccine 2 weeks ago

## 2020-01-23 NOTE — ED Notes (Signed)
ED Provider at bedside. 

## 2020-01-23 NOTE — ED Provider Notes (Signed)
[facility] Emergency Department Provider Note  ____________________________________________   First MD Initiated Contact with Patient 01/23/20 1042     (approximate)  I have reviewed the triage vital signs and the nursing notes.   HISTORY  Chief Complaint Fall and Dizziness    HPI Autumn Le is a 74 y.o. female who comes in for a fall.  Patient states he is otherwise healthy not really on any other medications.  Patient states that the last thing she remembers was 2 days ago.  She does not remember anything from yesterday.  She states that she woke up this morning and saw some blood near her bed and had an abrasion to her elbow and suspected that she must of had a fall but she does not remember anything.  Patient states that she just feels overall fatigued is a little bit lightheaded at this time.  She has a little bit of upper neck pain as well.  She had her second Covid shot a few days ago.          Past Medical History:  Diagnosis Date  . Vertigo     There are no problems to display for this patient.   Past Surgical History:  Procedure Laterality Date  . TONSILLECTOMY      Prior to Admission medications   Medication Sig Start Date End Date Taking? Authorizing Provider  amoxicillin (AMOXIL) 500 MG capsule Take 1 capsule (500 mg total) by mouth 3 (three) times daily. 09/08/18   Menshew, Charlesetta IvoryJenise V Bacon, PA-C    Allergies Patient has no known allergies.  Family History  Problem Relation Age of Onset  . Breast cancer Maternal Aunt     Social History Social History   Tobacco Use  . Smoking status: Current Every Day Smoker  . Smokeless tobacco: Never Used  Substance Use Topics  . Alcohol use: Not Currently  . Drug use: Not Currently      Review of Systems Constitutional: No fever/chills Eyes: No visual changes. ENT: No sore throat. Cardiovascular: Denies chest pain. Respiratory: Denies shortness of  breath. Gastrointestinal: No abdominal pain.  No nausea, no vomiting.  No diarrhea.  No constipation. Genitourinary: Negative for dysuria. Musculoskeletal: Negative for back pain. Skin: Negative for rash. Neurological: Negative for headaches, focal weakness or numbness. All other ROS negative ____________________________________________   PHYSICAL EXAM:  VITAL SIGNS: ED Triage Vitals  Enc Vitals Group     BP 01/23/20 0940 (!) 109/51     Pulse Rate 01/23/20 0940 70     Resp 01/23/20 0940 16     Temp 01/23/20 0940 98 F (36.7 C)     Temp Source 01/23/20 0940 Oral     SpO2 01/23/20 0940 99 %     Weight 01/23/20 0941 124 lb (56.2 kg)     Height 01/23/20 0941 5\' 4"  (1.626 m)     Head Circumference --      Peak Flow --      Pain Score 01/23/20 0941 0     Pain Loc --      Pain Edu? --      Excl. in GC? --     Constitutional: Alert and oriented. Well appearing and in no acute distress. Eyes: Conjunctivae are normal. EOMI. Head: Atraumatic. Nose: No congestion/rhinnorhea. Mouth/Throat: Mucous membranes are moist.   Neck: No stridor. Trachea Midline. FROM Cardiovascular: Normal rate, regular rhythm. Grossly normal heart sounds.  Good peripheral circulation. Respiratory: Normal respiratory effort.  No retractions.  Lungs CTAB. Gastrointestinal: Soft and nontender. No distention. No abdominal bruits.  Musculoskeletal: No lower extremity tenderness nor edema.  No joint effusions.  Small abrasion on her elbow but full range of motion. Neurologic:  Normal speech and language.  Neuro exam appears intact. Skin:  Skin is warm, dry and intact. No rash noted. Psychiatric: Mood and affect are normal. Speech and behavior are normal. GU: Deferred   ____________________________________________   LABS (all labs ordered are listed, but only abnormal results are displayed)  Labs Reviewed  BASIC METABOLIC PANEL - Abnormal; Notable for the following components:      Result Value   CO2 20 (*)     Glucose, Bld 140 (*)    Calcium 8.7 (*)    All other components within normal limits  CBC - Abnormal; Notable for the following components:   WBC 14.2 (*)    All other components within normal limits  TROPONIN I (HIGH SENSITIVITY) - Abnormal; Notable for the following components:   Troponin I (High Sensitivity) 474 (*)    All other components within normal limits  RESPIRATORY PANEL BY RT PCR (FLU A&B, COVID)  URINALYSIS, COMPLETE (UACMP) WITH MICROSCOPIC  CK  HEPATIC FUNCTION PANEL  CBG MONITORING, ED   ____________________________________________   ED ECG REPORT I, Concha Se, the attending physician, personally viewed and interpreted this ECG.  EKG is sinus rate of 68, no ST elevation, deeply inverted T waves in V1 through V3, normal intervals.  Concerning for Wellens ____________________________________________  RADIOLOGY Vela Prose, personally viewed and evaluated these images (plain radiographs) as part of my medical decision making, as well as reviewing the written report by the radiologist.  ED MD interpretation: CT imaging personally reviewed concerning for subarachnoid hemorrhage therefore CTA was ordered.  Official radiology report(s): CT Angio Head W or Wo Contrast  Result Date: 01/23/2020 CLINICAL DATA:  Dizziness EXAM: CT ANGIOGRAPHY HEAD AND NECK TECHNIQUE: Multidetector CT imaging of the head and neck was performed using the standard protocol during bolus administration of intravenous contrast. Multiplanar CT image reconstructions and MIPs were obtained to evaluate the vascular anatomy. Carotid stenosis measurements (when applicable) are obtained utilizing NASCET criteria, using the distal internal carotid diameter as the denominator. CONTRAST:  3mL OMNIPAQUE IOHEXOL 350 MG/ML SOLN COMPARISON:  None. FINDINGS: CTA NECK FINDINGS Aortic arch: Calcified and irregular noncalcified plaque is present along the aortic arch and at the patent great vessel origins.  Right carotid system: Patent. Calcified and noncalcified plaque at the ICA origin causes less than 50% stenosis. Left carotid system: Patent. Mild to moderate stenosis of the common carotid origin. Atherosclerotic wall thickening along the remainder of the common carotid. Calcified and noncalcified plaque at the bifurcation and along the proximal carotid with less than 50% stenosis. Vertebral arteries: Patent.  Left vertebral artery is dominant. Skeleton: Cervical spine dictated separately. Other neck: No mass or adenopathy. Upper chest: Emphysema. Review of the MIP images confirms the above findings CTA HEAD FINDINGS Anterior circulation: Intracranial internal carotid arteries are patent. Anterior and middle cerebral arteries are patent. There is in inferolaterally directed aneurysm of the distal supraclinoid right ICA likely contiguous with the posterior communicating artery origin measuring approximately 8.6 x 5.3 mm axially. Neck measures 2.8 mm. Second aneurysm of the right A2 ACA measuring approximately 2.5 x 3.6 mm axially. Posterior circulation: Intracranial right vertebral artery becomes markedly diminutive after PICA origin. Intracranial left vertebral arteries patent with minimal calcified plaque. Basilar artery is patent. Posterior cerebral arteries are  patent. Venous sinuses: As permitted by contrast timing, patent. Anatomic variants: Fetal origin of the right PCA. Review of the MIP images confirms the above findings IMPRESSION: Approximately 9 mm aneurysm of the communicating segment of the right ICA. Likely culprit aneurysm given distribution of hemorrhage on noncontrast CT. Additional approximately 4 mm aneurysm of the right A2 ACA. Carotid atherosclerosis with less than 50% stenosis of the proximal internal carotids. These results were called by telephone at the time of interpretation on 01/23/2020 at 12:09 pm to provider [facility] , who verbally acknowledged these results. Electronically Signed    By: Guadlupe Spanish M.D.   On: 01/23/2020 12:28   CT Head Wo Contrast  Result Date: 01/23/2020 CLINICAL DATA:  Head trauma, headache EXAM: CT HEAD WITHOUT CONTRAST CT CERVICAL SPINE WITHOUT CONTRAST TECHNIQUE: Multidetector CT imaging of the head and cervical spine was performed following the standard protocol without intravenous contrast. Multiplanar CT image reconstructions of the cervical spine were also generated. COMPARISON:  None. FINDINGS: CT HEAD FINDINGS Brain: There is subarachnoid hemorrhage about the right cerebral hemisphere and basal right frontal and right temporal lobes, as well as within the basal cisterns. No significant mass effect or midline shift. Small, incidental densely calcified right frontal meningioma. Parenchymal hypodensity of the right temporal pole. Vascular: No hyperdense vessel or unexpected calcification. Skull: Normal. Negative for fracture or focal lesion. Sinuses/Orbits: No acute finding. Other: None. CT CERVICAL SPINE FINDINGS Alignment: Normal. Skull base and vertebrae: No acute fracture. No primary bone lesion or focal pathologic process. Soft tissues and spinal canal: No prevertebral fluid or swelling. No visible canal hematoma. Disc levels: Mild moderate to severe disc space height loss and osteophytosis of C5-C6 and C6-C7 with otherwise mild disc space height loss. Upper chest: Negative. Other: None. IMPRESSION: 1. Extensive subarachnoid hemorrhage about the right cerebral hemisphere and basal cisterns. No significant mass effect or midline shift. This is of uncertain etiology, traumatic versus aneurysmal. No overlying skull fracture. Please see forthcoming CT angiogram. 2. Parenchymal hypodensity of the right temporal pole, of uncertain etiology, traumatic versus infarction. 3. No fracture or static subluxation of the cervical spine. These results were called by telephone at the time of interpretation on 01/23/2020 at 12:07 pm to provider [facility] , who verbally  acknowledged these results. Electronically Signed   By: Lauralyn Primes M.D.   On: 01/23/2020 12:08   CT Angio Neck W and/or Wo Contrast  Result Date: 01/23/2020 CLINICAL DATA:  Dizziness EXAM: CT ANGIOGRAPHY HEAD AND NECK TECHNIQUE: Multidetector CT imaging of the head and neck was performed using the standard protocol during bolus administration of intravenous contrast. Multiplanar CT image reconstructions and MIPs were obtained to evaluate the vascular anatomy. Carotid stenosis measurements (when applicable) are obtained utilizing NASCET criteria, using the distal internal carotid diameter as the denominator. CONTRAST:  17mL OMNIPAQUE IOHEXOL 350 MG/ML SOLN COMPARISON:  None. FINDINGS: CTA NECK FINDINGS Aortic arch: Calcified and irregular noncalcified plaque is present along the aortic arch and at the patent great vessel origins. Right carotid system: Patent. Calcified and noncalcified plaque at the ICA origin causes less than 50% stenosis. Left carotid system: Patent. Mild to moderate stenosis of the common carotid origin. Atherosclerotic wall thickening along the remainder of the common carotid. Calcified and noncalcified plaque at the bifurcation and along the proximal carotid with less than 50% stenosis. Vertebral arteries: Patent.  Left vertebral artery is dominant. Skeleton: Cervical spine dictated separately. Other neck: No mass or adenopathy. Upper chest: Emphysema. Review of  the MIP images confirms the above findings CTA HEAD FINDINGS Anterior circulation: Intracranial internal carotid arteries are patent. Anterior and middle cerebral arteries are patent. There is in inferolaterally directed aneurysm of the distal supraclinoid right ICA likely contiguous with the posterior communicating artery origin measuring approximately 8.6 x 5.3 mm axially. Neck measures 2.8 mm. Second aneurysm of the right A2 ACA measuring approximately 2.5 x 3.6 mm axially. Posterior circulation: Intracranial right vertebral  artery becomes markedly diminutive after PICA origin. Intracranial left vertebral arteries patent with minimal calcified plaque. Basilar artery is patent. Posterior cerebral arteries are patent. Venous sinuses: As permitted by contrast timing, patent. Anatomic variants: Fetal origin of the right PCA. Review of the MIP images confirms the above findings IMPRESSION: Approximately 9 mm aneurysm of the communicating segment of the right ICA. Likely culprit aneurysm given distribution of hemorrhage on noncontrast CT. Additional approximately 4 mm aneurysm of the right A2 ACA. Carotid atherosclerosis with less than 50% stenosis of the proximal internal carotids. These results were called by telephone at the time of interpretation on 01/23/2020 at 12:09 pm to provider [facility] , who verbally acknowledged these results. Electronically Signed   By: Guadlupe Spanish M.D.   On: 01/23/2020 12:28   CT Angio Chest PE W and/or Wo Contrast  Result Date: 01/23/2020 CLINICAL DATA:  Shortness of breath.  Fell yesterday. EXAM: CT ANGIOGRAPHY CHEST WITH CONTRAST TECHNIQUE: Multidetector CT imaging of the chest was performed using the standard protocol during bolus administration of intravenous contrast. Multiplanar CT image reconstructions and MIPs were obtained to evaluate the vascular anatomy. CONTRAST:  61mL OMNIPAQUE IOHEXOL 350 MG/ML SOLN COMPARISON:  04/08/2019 FINDINGS: Cardiovascular: Pulmonary arterial opacification is excellent. There are no pulmonary emboli. Heart size is normal. No pericardial fluid. No visible coronary artery calcification. Minimal aortic atherosclerotic calcification. Mediastinum/Nodes: No mediastinal or hilar mass or lymphadenopathy. Lungs/Pleura: Emphysema. Apical pleural and parenchymal scarring bilaterally which is stable. Previously seen 6 mm nodule in the right upper lobe has resolved, indicating that it was inflammatory. No other focal lung finding. Upper Abdomen: 1 cm cyst in the ventral liver.  No significant upper abdominal finding. Musculoskeletal: Negative Review of the MIP images confirms the above findings. IMPRESSION: No pulmonary emboli or other acute chest finding. Resolution of a previously seen 6 mm nodule in the right upper lobe, indicating that it was inflammatory. Aortic Atherosclerosis (ICD10-I70.0) and Emphysema (ICD10-J43.9). Electronically Signed   By: Paulina Fusi M.D.   On: 01/23/2020 12:05   CT Cervical Spine Wo Contrast  Result Date: 01/23/2020 CLINICAL DATA:  Head trauma, headache EXAM: CT HEAD WITHOUT CONTRAST CT CERVICAL SPINE WITHOUT CONTRAST TECHNIQUE: Multidetector CT imaging of the head and cervical spine was performed following the standard protocol without intravenous contrast. Multiplanar CT image reconstructions of the cervical spine were also generated. COMPARISON:  None. FINDINGS: CT HEAD FINDINGS Brain: There is subarachnoid hemorrhage about the right cerebral hemisphere and basal right frontal and right temporal lobes, as well as within the basal cisterns. No significant mass effect or midline shift. Small, incidental densely calcified right frontal meningioma. Parenchymal hypodensity of the right temporal pole. Vascular: No hyperdense vessel or unexpected calcification. Skull: Normal. Negative for fracture or focal lesion. Sinuses/Orbits: No acute finding. Other: None. CT CERVICAL SPINE FINDINGS Alignment: Normal. Skull base and vertebrae: No acute fracture. No primary bone lesion or focal pathologic process. Soft tissues and spinal canal: No prevertebral fluid or swelling. No visible canal hematoma. Disc levels: Mild moderate to severe disc space height  loss and osteophytosis of C5-C6 and C6-C7 with otherwise mild disc space height loss. Upper chest: Negative. Other: None. IMPRESSION: 1. Extensive subarachnoid hemorrhage about the right cerebral hemisphere and basal cisterns. No significant mass effect or midline shift. This is of uncertain etiology, traumatic  versus aneurysmal. No overlying skull fracture. Please see forthcoming CT angiogram. 2. Parenchymal hypodensity of the right temporal pole, of uncertain etiology, traumatic versus infarction. 3. No fracture or static subluxation of the cervical spine. These results were called by telephone at the time of interpretation on 01/23/2020 at 12:07 pm to provider [facility] , who verbally acknowledged these results. Electronically Signed   By: Eddie Candle M.D.   On: 01/23/2020 12:08    ____________________________________________   PROCEDURES  Procedure(s) performed (including Critical Care):  .Critical Care Performed by: Vanessa Firestone, MD Authorized by: Vanessa Samson, MD   Critical care provider statement:    Critical care time (minutes):  45   Critical care was necessary to treat or prevent imminent or life-threatening deterioration of the following conditions:  CNS failure or compromise   Critical care was time spent personally by me on the following activities:  Discussions with consultants, evaluation of patient's response to treatment, examination of patient, ordering and performing treatments and interventions, ordering and review of laboratory studies, ordering and review of radiographic studies, pulse oximetry, re-evaluation of patient's condition, obtaining history from patient or surrogate and review of old charts     ____________________________________________   INITIAL IMPRESSION / [place] / ED COURSE  Autumn Le was evaluated in Emergency Department on 01/23/2020 for the symptoms described in the history of present illness. She was evaluated in the context of the global COVID-19 pandemic, which necessitated consideration that the patient might be at risk for infection with the SARS-CoV-2 virus that causes COVID-19. Institutional protocols and algorithms that pertain to the evaluation of patients at risk for COVID-19 are in a state of rapid change based on  information released by regulatory bodies including the CDC and federal and state organizations. These policies and algorithms were followed during the patient's care in the ED.    Patient is a 74 year old who comes in with potential fall but does not remember anything from the event that occurred yesterday with elevated troponin.  Patient was placed in room troponin had already resulted and it was elevated.  I reviewed the EKG which looks concerning for Wellens with deeply inverted T waves in V1 through V3.  Patient is chest pain-free at this time and will hold off on heparin due to his need to rule out intracranial hemorrhage with CT scan first.  Patient will also get CT cervical to evaluate for cervical fracture.  Given patient's syncope plus elevated troponin will also get CT PE to make sure there is no evidence of pulmonary embolism.  Patient will require admission for syncope work-up and discussion with cardiology.  CT head normal upon my review was concerning for subarachnoid hemorrhage therefore I ordered a CTA  12:07 PM 60mm aneurysm distal R ICA with SAH.  Will place consult to [facility].   Patient is not on any blood thinners.  Does not take an aspirin.  Blood pressures are less than 160.  Troponin is stably elevated at 474.  At this time will hold off on heparin and cardiac evaluation due to my concern for the aneurysm that will need more urgent attention.  CK was slightly elevated but normal kidney function at this  time.  Discussed with Dr. Franky Macho [facility] who accepted patient for transfer ED to ED given patient will most likely need a procedure for her aneurysm.  He would like [place] to give a dose of nimodipine.  Patient transferred to [facility]  ____________________________________________   FINAL CLINICAL IMPRESSION(S) / ED DIAGNOSES   Final diagnoses:  Subarachnoid hemorrhage (HCC)  Elevated troponin  Abnormal EKG  Syncope and collapse       MEDICATIONS GIVEN DURING THIS VISIT:  Medications  niMODipine (NIMOTOP) capsule 60 mg (has no administration in time range)  sodium chloride 0.9 % bolus 1,000 mL (0 mLs Intravenous Stopped 01/23/20 1257)  acetaminophen (TYLENOL) tablet 1,000 mg (1,000 mg Oral Given 01/23/20 1150)  iohexol (OMNIPAQUE) 350 MG/ML injection 75 mL (75 mLs Intravenous Contrast Given 01/23/20 1117)     ED Discharge Orders    None       Note:  This document was prepared using Dragon voice recognition software and may include unintentional dictation errors.   Concha Se, MD 01/23/20 1318

## 2020-01-24 ENCOUNTER — Inpatient Hospital Stay (HOSPITAL_COMMUNITY): Payer: Medicare HMO

## 2020-01-24 DIAGNOSIS — I609 Nontraumatic subarachnoid hemorrhage, unspecified: Secondary | ICD-10-CM

## 2020-01-24 MED ORDER — IBUPROFEN 200 MG PO TABS
400.0000 mg | ORAL_TABLET | Freq: Three times a day (TID) | ORAL | Status: DC | PRN
  Administered 2020-01-24 – 2020-01-30 (×6): 400 mg via ORAL
  Filled 2020-01-24 (×6): qty 2

## 2020-01-24 MED ORDER — HYDROCODONE-ACETAMINOPHEN 5-325 MG PO TABS
1.0000 | ORAL_TABLET | ORAL | Status: DC | PRN
  Administered 2020-01-24: 1 via ORAL
  Administered 2020-01-24: 2 via ORAL
  Administered 2020-01-25: 1 via ORAL
  Administered 2020-01-25 (×2): 2 via ORAL
  Administered 2020-01-25: 08:00:00 1 via ORAL
  Administered 2020-01-26: 2 via ORAL
  Administered 2020-01-26: 1 via ORAL
  Administered 2020-01-26 – 2020-01-30 (×14): 2 via ORAL
  Filled 2020-01-24 (×8): qty 2
  Filled 2020-01-24: qty 1
  Filled 2020-01-24 (×4): qty 2
  Filled 2020-01-24: qty 1
  Filled 2020-01-24 (×3): qty 2
  Filled 2020-01-24: qty 1
  Filled 2020-01-24 (×4): qty 2

## 2020-01-24 NOTE — Evaluation (Signed)
Occupational Therapy Evaluation Patient Details Name: Autumn Le MRN: [id number] DOB: 04-10-1946 Today's Date: 01/24/2020    History of Present Illness Pt is a 74 y.o. F with significant PMH of vertigo, current tobacco use and COPD who presents with confusion. CT head showing SAH about the right cerebral hemisphere and basal cisterns. CTA showing 9 mm aneurysm of R ICA and 4 mm aneurysm of right A2 ACA. S/p coil embolization of right posterior communicating artery aneurysm 3/12.   Clinical Impression   Pt PTA: Pt independent with ADL/IADL and mobility. Pt currently with decreased orientation and inability to recongize deficits. Pt unable to recall 3 items after 3 mins and after 10 mins; immediate recall, WFLs. Pt with memory deficits and decreased awareness of deficits. Pt minguardA to minA overall for ADL. Pt minA +2 for stability with mobility or modA +1 due to instability with mobility. Pt lives alone and would be very unsafe at home by herself.  Pt would benefit from continued OT skilled services for ADL, cognition and safety. OT following acutely.    Follow Up Recommendations  CIR    Equipment Recommendations  Other (comment)(to be determined)    Recommendations for Other Services Rehab consult     Precautions / Restrictions Precautions Precautions: Fall Restrictions Weight Bearing Restrictions: No      Mobility Bed Mobility Overal bed mobility: Needs Assistance Bed Mobility: Supine to Sit     Supine to sit: Supervision        Transfers Overall transfer level: Needs assistance Equipment used: None Transfers: Sit to/from Stand Sit to Stand: Min guard         General transfer comment: for safety    Balance Overall balance assessment: Needs assistance Sitting-balance support: Feet supported Sitting balance-Leahy Scale: Good     Standing balance support: No upper extremity supported;During functional activity Standing balance-Leahy Scale: Fair                              ADL either performed or assessed with clinical judgement   ADL Overall ADL's : Needs assistance/impaired Eating/Feeding: Set up;Sitting   Grooming: Min guard;Standing Grooming Details (indicate cue type and reason): Pt forgot that comb was available to comb hair- pt started combing hair with digits. Upper Body Bathing: Set up;Sitting   Lower Body Bathing: Min guard;Sitting/lateral leans;Sit to/from stand   Upper Body Dressing : Min guard;Sitting   Lower Body Dressing: Minimal assistance;Cueing for safety;Sitting/lateral leans;Sit to/from stand   Toilet Transfer: Minimal assistance;Cueing for safety;Ambulation   Toileting- Clothing Manipulation and Hygiene: Minimal assistance;Sitting/lateral lean;Sit to/from stand;Cueing for safety;Cueing for sequencing       Functional mobility during ADLs: Minimal assistance;+2 for physical assistance;Cueing for safety;Cueing for sequencing(with hand held assist) General ADL Comments: Pt with decreased safety awareness and short term memory cognitive deficits, decreased strength and decreased ability to care for self.     Vision Baseline Vision/History: Wears glasses Wears Glasses: At all times Patient Visual Report: No change from baseline Vision Assessment?: Yes Eye Alignment: Within Functional Limits Ocular Range of Motion: Within Functional Limits Alignment/Gaze Preference: Within Defined Limits Additional Comments: Continue to assess     Perception     Praxis      Pertinent Vitals/Pain Pain Assessment: Faces Faces Pain Scale: Hurts a little bit Pain Location: mild headache Pain Descriptors / Indicators: Headache Pain Intervention(s): Monitored during session;RN gave pain meds during session     Hand Dominance Right  Extremity/Trunk Assessment Upper Extremity Assessment Upper Extremity Assessment: Generalized weakness   Lower Extremity Assessment Lower Extremity Assessment: Overall WFL for  tasks assessed   Cervical / Trunk Assessment Cervical / Trunk Assessment: Normal   Communication Communication Communication: No difficulties   Cognition Arousal/Alertness: Awake/alert Behavior During Therapy: WFL for tasks assessed/performed Overall Cognitive Status: Impaired/Different from baseline Area of Impairment: Orientation;Memory;Awareness                 Orientation Level: Disoriented to;Place;Situation   Memory: Decreased short-term memory     Awareness: Intellectual   General Comments: Pt with decreased orientation and inability to recongize deficits. Pt unable to recall 3 items after 3 mins and after 10 mins; immediate recall, WFLs.   General Comments  Pt lives alone and would be very unsafe at home by herself. Pt with memory deficits and decreased awareness of deficits.    Exercises     Shoulder Instructions      Home Living Family/patient expects to be discharged to:: Private residence Living Arrangements: Alone Available Help at Discharge: Family;Available PRN/intermittently Type of Home: Apartment Home Access: Elevator     Home Layout: One level     Bathroom Shower/Tub: Teacher, early years/pre: Standard     Home Equipment: Tub bench   Additional Comments: [address] in [place] [facility]; on 5th floor      Prior Functioning/Environment Level of Independence: Independent        Comments: Driving, medications, cooking, cleaning; retired        OT Problem List: Decreased strength;Decreased activity tolerance;Impaired balance (sitting and/or standing);Decreased cognition;Decreased safety awareness;Pain;Impaired vision/perception      OT Treatment/Interventions: Self-care/ADL training;Therapeutic exercise;Neuromuscular education;Energy conservation;DME and/or AE instruction;Therapeutic activities;Cognitive remediation/compensation;Visual/perceptual remediation/compensation;Patient/family education;Balance training     OT Goals(Current goals can be found in the care plan section) Acute Rehab OT Goals Patient Stated Goal: agreeable to rehab OT Goal Formulation: With patient Time For Goal Achievement: 02/07/20 Potential to Achieve Goals: Good ADL Goals Pt Will Perform Lower Body Dressing: with min guard assist;sit to/from stand Pt Will Perform Toileting - Clothing Manipulation and hygiene: with min guard assist;sitting/lateral leans;sit to/from stand Pt/caregiver will Perform Home Exercise Program: Increased strength;Both right and left upper extremity;With Supervision Additional ADL Goal #1: Pt will follow 2 multi step commands with minimal cueing  and 90% accuracy. Additional ADL Goal #2: Pt will increase to supervisionA for OOB ADL with good safety awareness.  OT Frequency: Min 2X/week   Barriers to D/C:            Co-evaluation PT/OT/SLP Co-Evaluation/Treatment: Yes Reason for Co-Treatment: Complexity of the patient's impairments (multi-system involvement);To address functional/ADL transfers PT goals addressed during session: Mobility/safety with mobility OT goals addressed during session: ADL's and self-care      AM-PAC OT "6 Clicks" Daily Activity     Outcome Measure Help from another person eating meals?: A Little Help from another person taking care of personal grooming?: A Little Help from another person toileting, which includes using toliet, bedpan, or urinal?: A Little Help from another person bathing (including washing, rinsing, drying)?: A Lot Help from another person to put on and taking off regular upper body clothing?: A Little Help from another person to put on and taking off regular lower body clothing?: A Little 6 Click Score: 17   End of Session Equipment Utilized During Treatment: Gait belt Nurse Communication: Mobility status  Activity Tolerance: Patient tolerated treatment well Patient left: in chair;with call bell/phone within reach;with chair  alarm set  OT Visit  Diagnosis: Unsteadiness on feet (R26.81);Muscle weakness (generalized) (M62.81);Other symptoms and signs involving cognitive function                Time: 5366-4403 OT Time Calculation (min): 26 min Charges:  OT General Charges $OT Visit: 1 Visit OT Evaluation $OT Eval Moderate Complexity: 1 Mod  Flora Lipps, OTR/L Acute Rehabilitation Services Pager: [phone] Office: [phone]   Maiyah Goyne C 01/24/2020, 3:34 PM

## 2020-01-24 NOTE — Progress Notes (Signed)
Transcranial Doppler  Date POD PCO2 HCT BP  MCA ACA PCA OPHT SIPH VERT Basilar  3/13 MR      Right  Left   *  17   *  *   *  30   20  22    75  51   *  -17   21           Right  Left                                            Right  Left                                             Right  Left                                             Right  Left                                            Right  Left                                            Right  Left                                        MCA = Middle Cerebral Artery      OPHT = Opthalmic Artery     BASILAR = Basilar Artery   ACA = Anterior Cerebral Artery     SIPH = Carotid Siphon PCA = Posterior Cerebral Artery   VERT = Verterbral Artery                   Normal MCA = 62+\-12 ACA = 50+\-12 PCA = 42+\-23   01/24/2020 10:03 AM

## 2020-01-24 NOTE — Progress Notes (Signed)
  NEUROSURGERY PROGRESS NOTE   Pt seen and examined. No issues overnight. Reports HA, otherwise no new complaints.  EXAM: Temp:  [97.3 F (36.3 C)-98.3 F (36.8 C)] 97.8 F (36.6 C) (03/12 1905) Pulse Rate:  [48-85] 53 (03/13 0800) Resp:  [10-31] 19 (03/13 0800) BP: (58-146)/(45-92) 142/48 (03/13 0800) SpO2:  [94 %-100 %] 95 % (03/13 0800) Arterial Line BP: (93-178)/(51-161) 96/87 (03/13 0445) FiO2 (%):  [28 %] 28 % (03/12 1932) Weight:  [55.3 kg-56.2 kg] 55.3 kg (03/12 1432) Intake/Output      03/12 0701 - 03/13 0700 03/13 0701 - 03/14 0700   P.O.  180   I.V. (mL/kg) 1975.8 (35.7) 99.9 (1.8)   IV Piggyback 50    Total Intake(mL/kg) 2025.8 (36.6) 279.9 (5.1)   Urine (mL/kg/hr) 1350    Blood 50    Total Output 1400    Net +625.8 +279.9         Awake, alert, oriented Speech fluent CN intact MAE well Right groin soft, no hematoma  LABS: Lab Results  Component Value Date   CREATININE 0.57 01/23/2020   BUN 20 01/23/2020   NA 135 01/23/2020   K 3.6 01/23/2020   CL 105 01/23/2020   CO2 20 (L) 01/23/2020   Lab Results  Component Value Date   WBC 12.1 (H) 01/23/2020   HGB 10.7 (L) 01/23/2020   HCT 32.2 (L) 01/23/2020   MCV 97.9 01/23/2020   PLT 217 01/23/2020    IMPRESSION: - 74 y.o. female SAH d#3 s/p coiling ruptured right Pcom aneurysm, has unruptured right distal ACA and left cavernous aneurysms. Neurologically at baseline  PLAN: - Cont vasospasm observation - [place] - OOB today - Regular diet - Can relax SBP to <156mmHg

## 2020-01-24 NOTE — Progress Notes (Signed)
Rehab Admissions Coordinator Note:  Patient was screened by Clois Dupes for appropriateness for an Inpatient Acute Rehab Consult per PT and OT recs.   At this time, we are recommending Inpatient Rehab consult. Please place order for consult.  Clois Dupes RN MSN 01/24/2020, 4:58 PM  I can be reached at [phone].

## 2020-01-24 NOTE — Evaluation (Signed)
Physical Therapy Evaluation Patient Details Name: Autumn Le MRN: [id number] DOB: 07-05-1946 Today's Date: 01/24/2020   History of Present Illness  Pt is a 74 y.o. F with significant PMH of vertigo, current tobacco use and COPD who presents with confusion. CT head showing SAH about the right cerebral hemisphere and basal cisterns. CTA showing 9 mm aneurysm of R ICA and 4 mm aneurysm of right A2 ACA. S/p coil embolization of right posterior communicating artery aneurysm 3/12.  Clinical Impression  Prior to admission, pt lives alone, and is independent. She enjoys doing yard work at her sister's home. On PT evaluation, pt presents with decreased cognition including no recall of place or situation and lacks awareness into deficits. Pt ambulating 100 feet with two person minimal assist via handheld guidance with noted gait abnormalities and dynamic balance impairments. Pt presents as a high fall risk based on decreased gait speed and safety awareness. Given PLOF, would greatly benefit from CIR to return to a modI level and for cognitive remediation.     Follow Up Recommendations CIR    Equipment Recommendations  Rolling walker with 5" wheels    Recommendations for Other Services       Precautions / Restrictions Precautions Precautions: Fall Restrictions Weight Bearing Restrictions: No      Mobility  Bed Mobility Overal bed mobility: Needs Assistance Bed Mobility: Supine to Sit     Supine to sit: Supervision        Transfers Overall transfer level: Needs assistance Equipment used: None Transfers: Sit to/from Stand Sit to Stand: Min guard            Ambulation/Gait Ambulation/Gait assistance: Min assist;+2 physical assistance Gait Distance (Feet): 100 Feet Assistive device: None Gait Pattern/deviations: Step-through pattern;Decreased stride length;Scissoring Gait velocity: decreased   General Gait Details: Pt requiring minA + 2 for stability, demonstrates  decreased pelvic rotation, arm swing, occasional scissoring.   Stairs            Wheelchair Mobility    Modified Rankin (Stroke Patients Only) Modified Rankin (Stroke Patients Only) Pre-Morbid Rankin Score: No symptoms Modified Rankin: Moderately severe disability     Balance Overall balance assessment: Needs assistance Sitting-balance support: Feet supported Sitting balance-Leahy Scale: Good     Standing balance support: No upper extremity supported;During functional activity Standing balance-Leahy Scale: Fair                               Pertinent Vitals/Pain Pain Assessment: Faces Faces Pain Scale: Hurts a little bit Pain Location: mild headache Pain Descriptors / Indicators: Headache Pain Intervention(s): Monitored during session    Home Living Family/patient expects to be discharged to:: Private residence Living Arrangements: Alone Available Help at Discharge: Family;Available PRN/intermittently(sister/niece) Type of Home: Apartment Home Access: Elevator     Home Layout: One level Home Equipment: Tub bench Additional Comments: [address] in [place] [facility] seniors; on 5th floor    Prior Function Level of Independence: Independent         Comments: Driving, medications, cooking, cleaning; retired     Higher education careers adviser Dominance   Dominant Hand: Right    Extremity/Trunk Assessment   Upper Extremity Assessment Upper Extremity Assessment: Defer to OT evaluation    Lower Extremity Assessment Lower Extremity Assessment: Overall WFL for tasks assessed       Communication   Communication: No difficulties  Cognition Arousal/Alertness: Awake/alert Behavior During Therapy: WFL for tasks assessed/performed Overall Cognitive Status: Impaired/Different  from baseline Area of Impairment: Orientation;Memory;Awareness                 Orientation Level: Disoriented to;Place;Situation   Memory: Decreased short-term memory     Awareness:  Intellectual   General Comments: Pt not oriented to place or situation, states she is at [facility]. Pt with no recognition of deficits, stating her walking felt normal.      General Comments      Exercises     Assessment/Plan    PT Assessment Patient needs continued PT services  PT Problem List Decreased strength;Decreased balance;Decreased mobility;Decreased coordination;Decreased cognition;Decreased safety awareness       PT Treatment Interventions Gait training;Stair training;Functional mobility training;Therapeutic activities;Therapeutic exercise;Balance training;Patient/family education    PT Goals (Current goals can be found in the Care Plan section)  Acute Rehab PT Goals Patient Stated Goal: agreeable to rehab PT Goal Formulation: With patient Time For Goal Achievement: 02/07/20 Potential to Achieve Goals: Good    Frequency Min 4X/week   Barriers to discharge        Co-evaluation PT/OT/SLP Co-Evaluation/Treatment: Yes Reason for Co-Treatment: To address functional/ADL transfers PT goals addressed during session: Mobility/safety with mobility         AM-PAC PT "6 Clicks" Mobility  Outcome Measure Help needed turning from your back to your side while in a flat bed without using bedrails?: None Help needed moving from lying on your back to sitting on the side of a flat bed without using bedrails?: None Help needed moving to and from a bed to a chair (including a wheelchair)?: A Little Help needed standing up from a chair using your arms (e.g., wheelchair or bedside chair)?: A Little Help needed to walk in hospital room?: A Little Help needed climbing 3-5 steps with a railing? : A Little 6 Click Score: 20    End of Session Equipment Utilized During Treatment: Gait belt Activity Tolerance: Patient tolerated treatment well Patient left: in chair;with call bell/phone within reach;with chair alarm set Nurse Communication: Mobility status PT Visit Diagnosis:  Unsteadiness on feet (R26.81);Other abnormalities of gait and mobility (R26.89);Difficulty in walking, not elsewhere classified (R26.2)    Time: 8315-1761 PT Time Calculation (min) (ACUTE ONLY): 25 min   Charges:   PT Evaluation $PT Eval Moderate Complexity: 1 Mod            Wyona Almas, PT, DPT Acute Rehabilitation Services Pager [phone] Office [phone]   Deno Etienne 01/24/2020, 1:39 PM

## 2020-01-25 MED ORDER — NIMODIPINE 6 MG/ML PO SOLN: 60.0000 mg | ORAL | Status: DC

## 2020-01-25 MED ORDER — NIMODIPINE 30 MG PO CAPS
60.0000 mg | ORAL_CAPSULE | ORAL | Status: DC
  Administered 2020-01-25 – 2020-01-31 (×36): 60 mg via ORAL
  Filled 2020-01-25 (×33): qty 2

## 2020-01-25 MED ORDER — METHOCARBAMOL 500 MG PO TABS
500.0000 mg | ORAL_TABLET | Freq: Three times a day (TID) | ORAL | Status: DC | PRN
  Administered 2020-01-25 – 2020-01-30 (×3): 500 mg via ORAL
  Filled 2020-01-25 (×3): qty 1

## 2020-01-25 NOTE — Transfer of Care (Signed)
Immediate Anesthesia Transfer of Care Note  Patient: Autumn Le  Procedure(s) Performed: RADIOLOGY WITH ANESTHESIA (N/A )  Patient Location: PACU  Anesthesia Type:General  Level of Consciousness: awake and patient cooperative  Airway & Oxygen Therapy: Patient Spontanous Breathing and Patient connected to nasal cannula oxygen  Post-op Assessment: Report given to RN and Post -op Vital signs reviewed and stable  Post vital signs: Reviewed and stable  Last Vitals:  Vitals Value Taken Time  BP    Temp    Pulse 54 01/25/20 0719  Resp 18 01/25/20 0719  SpO2 93 % 01/25/20 0719  Vitals shown include unvalidated device data.  Last Pain:  Vitals:   01/25/20 0620  TempSrc:   PainSc: Asleep         Complications: No apparent anesthesia complications

## 2020-01-25 NOTE — Progress Notes (Signed)
Patient ID: Autumn Le, female   DOB: 15-Mar-1946, 74 y.o.   MRN: [id number] BP (!) 151/66   Pulse 60   Temp 98.1 F (36.7 C) (Oral)   Resp 14   Ht 5\' 4"  (1.626 m)   Wt 55.3 kg   SpO2 93%   BMI 20.94 kg/m  Alert and oriented x 4, speech is clear and fluent No drift Perrl, full eom Tongue and uvula midline Normal shoulder shrug Moving all extremities Doing well, continued headache

## 2020-01-25 NOTE — Anesthesia Postprocedure Evaluation (Signed)
Anesthesia Post Note  Patient: Autumn Le  Procedure(s) Performed: RADIOLOGY WITH ANESTHESIA (N/A )     Patient location during evaluation: PACU Anesthesia Type: General Level of consciousness: awake and patient cooperative Pain management: pain level controlled Vital Signs Assessment: post-procedure vital signs reviewed and stable Respiratory status: spontaneous breathing, nonlabored ventilation, respiratory function stable and patient connected to nasal cannula oxygen Cardiovascular status: blood pressure returned to baseline and stable Postop Assessment: no apparent nausea or vomiting Anesthetic complications: no    Last Vitals:  Vitals:   01/25/20 0000 01/25/20 0400  BP: 139/64 (!) 145/65  Pulse: (!) 52 (!) 51  Resp: 15 15  Temp:  36.9 C  SpO2: 95% 95%    Last Pain:  Vitals:   01/25/20 0620  TempSrc:   PainSc: Asleep                 Taygen Newsome

## 2020-01-26 ENCOUNTER — Inpatient Hospital Stay (HOSPITAL_COMMUNITY): Payer: Medicare HMO

## 2020-01-26 ENCOUNTER — Encounter: Payer: Self-pay | Admitting: *Deleted

## 2020-01-26 DIAGNOSIS — I609 Nontraumatic subarachnoid hemorrhage, unspecified: Secondary | ICD-10-CM

## 2020-01-26 NOTE — Progress Notes (Signed)
Physical Therapy Treatment Patient Details Name: Autumn Le MRN: [id number] DOB: January 11, 1946 Today's Date: 01/26/2020    History of Present Illness Pt is a 74 y.o. F with significant PMH of vertigo, current tobacco use and COPD who presents with confusion. CT head showing SAH about the right cerebral hemisphere and basal cisterns. CTA showing 9 mm aneurysm of R ICA and 4 mm aneurysm of right A2 ACA. S/p coil embolization of right posterior communicating artery aneurysm 3/12.    PT Comments    Patient sitting on EOB (between foot board and side rail) with sister present on arrival. Needing to use the bathroom. Patient was patient while preparing lines/tubes/monitors prior to standing. Utilized RW as per last therapist recommendation. Patient drifts inside the walker closer to the left side/legs and overall drifts off path to her left. RW became a tripping hazard as pt turning head to look to her left. Fatigued quickly with ambulation.     Follow Up Recommendations  CIR     Equipment Recommendations  Rolling walker with 5" wheels    Recommendations for Other Services       Precautions / Restrictions Precautions Precautions: Fall Restrictions Weight Bearing Restrictions: No    Mobility  Bed Mobility               General bed mobility comments: sitting on EOB with family present "waiting for someone to come take me to the bathroom"  Transfers Overall transfer level: Needs assistance Equipment used: Rolling walker (2 wheeled) Transfers: Sit to/from Stand Sit to Stand: Min assist         General transfer comment: vc for proper use of RW (pt did not follow instructions given either time sit to stand)  Ambulation/Gait Ambulation/Gait assistance: Min assist Gait Distance (Feet): 150 Feet Assistive device: Rolling walker (2 wheeled) Gait Pattern/deviations: Step-through pattern;Decreased stride length;Drifts right/left;Narrow base of support Gait velocity:  decreased   General Gait Details: Used RW as recommended by last therapist; pt with poor attention to walker, walks closer to left side of walker with near trip over back leg of walker; also drifting off path to her left (ran into objects x3)   Stairs             Wheelchair Mobility    Modified Rankin (Stroke Patients Only) Modified Rankin (Stroke Patients Only) Pre-Morbid Rankin Score: No symptoms Modified Rankin: Moderately severe disability     Balance Overall balance assessment: Needs assistance Sitting-balance support: Feet unsupported;No upper extremity supported Sitting balance-Leahy Scale: Good     Standing balance support: No upper extremity supported;During functional activity Standing balance-Leahy Scale: Fair Standing balance comment: stood at EOB prior to walk                            Cognition Arousal/Alertness: Awake/alert Behavior During Therapy: WFL for tasks assessed/performed Overall Cognitive Status: Impaired/Different from baseline Area of Impairment: Awareness;Problem solving                 Orientation Level: (not tested)         Awareness: Intellectual Problem Solving: Slow processing;Difficulty sequencing;Requires verbal cues;Requires tactile cues General Comments: Pt unaware she is drifting to her left when walking; required assist with walker to stay on path      Exercises      General Comments General comments (skin integrity, edema, etc.): pt very fatigued with walking--standing rest x 2; fell asleep quickly once seated in recliner;  sister present throughout session; very supportive      Pertinent Vitals/Pain Pain Assessment: Faces Faces Pain Scale: Hurts little more Pain Location: neck Pain Descriptors / Indicators: Discomfort;Sore Pain Intervention(s): Limited activity within patient's tolerance;Monitored during session    Home Living     Available Help at Discharge: Family;Friend(s) Type of Home:  Apartment              Prior Function            PT Goals (current goals can now be found in the care plan section) Acute Rehab PT Goals Patient Stated Goal: agreeable to rehab Time For Goal Achievement: 02/07/20 Potential to Achieve Goals: Good Progress towards PT goals: Progressing toward goals    Frequency    Min 4X/week      PT Plan Current plan remains appropriate    Co-evaluation              AM-PAC PT "6 Clicks" Mobility   Outcome Measure  Help needed turning from your back to your side while in a flat bed without using bedrails?: None Help needed moving from lying on your back to sitting on the side of a flat bed without using bedrails?: None Help needed moving to and from a bed to a chair (including a wheelchair)?: A Little Help needed standing up from a chair using your arms (e.g., wheelchair or bedside chair)?: A Little Help needed to walk in hospital room?: A Little Help needed climbing 3-5 steps with a railing? : A Little 6 Click Score: 20    End of Session Equipment Utilized During Treatment: Gait belt Activity Tolerance: Patient limited by fatigue Patient left: in chair;with call bell/phone within reach;with chair alarm set;with family/visitor present;with nursing/sitter in room Nurse Communication: Mobility status PT Visit Diagnosis: Unsteadiness on feet (R26.81);Other abnormalities of gait and mobility (R26.89);Difficulty in walking, not elsewhere classified (R26.2)     Time: 1191-4782 PT Time Calculation (min) (ACUTE ONLY): 20 min  Charges:  $Gait Training: 8-22 mins                      Jerolyn Center, PT Pager [phone]    Zena Amos 01/26/2020, 3:40 PM

## 2020-01-26 NOTE — PMR Pre-admission (Signed)
PMR Admission Coordinator Pre-Admission Assessment  Patient: Autumn Le is an 74 y.o., female MRN: [id number] DOB: Aug 11, 1946 Height: '5\' 4"'$  (162.6 cm) Weight: 55.3 kg              Insurance Information HMO: yes    PPO:      PCP:      IPA:      80/20:      OTHER:  PRIMARY: [employer]      Policy#: [id number]      Subscriber: Patient CM Name: Shirlean Mylar       Phone#: [phone]     Fax#: [phone] Pre-Cert#: [id number]      Employer:  [employer]auf[employer] provided by Shirlean Mylar for admit to CIR. Auth is good for 5 days. Initial start date 3/16 with updates due weekly to Kierra (p): [phone] (f): [phone].  Benefits:  Phone #: online     Name: [email or web address], transaction ID # [id number] Eff. Date: 11/14/19-11/12/20     Deduct: does not have for in-network providers      Out of Pocket Max: $3,900($0 met)      Life Max: NA CIR: $295/day co-pay for days 1-6, $0/day co-pay for days 7-90.       SNF: $0/day co-pay for days 1-20, $184/day co-pay for days 21-100; limited to 100 days/cal yr Outpatient: $10-$40/visit co-pay pending service; visits limited by medical necessity    Home Health: 100% coverage, 0% co-insurance; limited by medical necessity DME: 80% coverage, 20% co-insurance  Providers:  SECONDARY: None      Policy#:       Subscriber:  CM Name:       Phone#:      Fax#:  Pre-Cert#:       Employer:  Benefits:  Phone #:      Name:  Eff. Date:     Deduct:       Out of Pocket Max:       Life Max:  CIR:       SNF:  Outpatient:      Co-Pay:  Home Health:       Co-Pay:  DME:      Co-Pay:   Medicaid Application Date:       Case Manager:  Disability Application Date:       Case Worker:   The "Data Collection Information Summary" for patients in Inpatient Rehabilitation Facilities with attached "Privacy Act [place] Records" was provided and verbally reviewed with: Patient  Emergency Contact Information Contact Information    Name Relation Home Work [place], [place] Sister [phone]       Current Medical History  Patient Admitting Diagnosis: SAH  History of Present Illness: JENNI THEW is a 74 y.o. with history of vertigo as well as tobacco abuse.  Presented 01/23/2020 to [facility] hospital with altered mental status as well as mild headache.  Patient could not recall full incident.  Admission chemistries unremarkable, troponin high-sensitivity 474, CK 458.  Cranial CT scan showed extensive subarachnoid hemorrhage about the right cerebral hemisphere and basal cisterns.  No significant mass-effect or midline shift.  Parenchymal hypodensity of the right temporal lobe of uncertain etiology traumatic versus infarction.  No fracture or static subluxation of the cervical spine.  CT angiogram of the chest without pulmonary emboli.  CT angiogram of head and neck approximately 9 mm aneurysm of the communicating segment of the right ICA.  Additional approximately 4 mm aneurysm of the right A2 ACA.  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 Kathyrn Sheriff.  Nimotop initiated for blood pressure control.  Tolerating a regular diet.  Therapy evaluations completed with recommendations for CIR. Pt is to admit to CIR on 01/31/20.  Complete NIHSS TOTAL: 0 Glasgow Coma Scale Score: 15  Past Medical History  Past Medical History:  Diagnosis Date  . COPD (chronic obstructive pulmonary disease) ([place])   . Degenerative joint disease of cervical spine   . Meniere's disease in remission, bilateral   . Osteopenia   . Vertigo     Family History  family history includes Breast cancer in her maternal aunt.  Prior Rehab/Hospitalizations:  Has the patient had prior rehab or hospitalizations prior to admission? No  Has the patient had major surgery during 100 days prior to admission? Yes  Current Medications   Current Facility-Administered Medications:  .   stroke:  mapping our early stages of recovery book, , Does not apply, Once, Consuella Lose, MD .  0.9 %  sodium chloride infusion, , Intravenous, Continuous, Consuella Lose, MD, Last Rate: 20 mL/hr at 01/29/20 1400, Rate Verify at 01/29/20 1400 .  acetaminophen (TYLENOL) tablet 650 mg, 650 mg, Oral, Q4H PRN, 650 mg at 01/26/20 1005 **OR** acetaminophen (TYLENOL) 160 MG/5ML solution 650 mg, 650 mg, Per Tube, Q4H PRN **OR** acetaminophen (TYLENOL) suppository 650 mg, 650 mg, Rectal, Q4H PRN, Consuella Lose, MD .  albuterol (PROVENTIL) (2.5 MG/3ML) 0.083% nebulizer solution 2.5 mg, 2.5 mg, Nebulization, Q6H PRN, Consuella Lose, MD .  butalbital-acetaminophen-caffeine (FIORICET) 50-325-40 MG per tablet 1-2 tablet, 1-2 tablet, Oral, Q4H PRN, Costella, Vista Mink, PA-C, 2 tablet at 01/29/20 1617 .  calcium carbonate (OS-CAL - dosed in mg of elemental calcium) tablet 500 mg of elemental calcium, 500 mg of elemental calcium, Oral, QHS, Nundkumar, Neelesh, MD, 500 mg of elemental calcium at 01/28/20 2110 .  Chlorhexidine Gluconate Cloth 2 % PADS 6 each, 6 each, Topical, Daily, Consuella Lose, MD, 6 each at 01/28/20 1055 .  cholecalciferol (VITAMIN D3) tablet 2,000 Units, 2,000 Units, Oral, QHS, Consuella Lose, MD, 2,000 Units at 01/28/20 2107 .  docusate sodium (COLACE) capsule 100 mg, 100 mg, Oral, BID, Consuella Lose, MD, 100 mg at 01/29/20 1142 .  fluticasone (FLONASE) 50 MCG/ACT nasal spray 2 spray, 2 spray, Each Nare, Daily, Consuella Lose, MD, 2 spray at 01/29/20 1356 .  fluticasone furoate-vilanterol (BREO ELLIPTA) 100-25 MCG/INH 1 puff, 1 puff, Inhalation, Daily, Consuella Lose, MD, 1 puff at 01/28/20 0845 .  HYDROcodone-acetaminophen (NORCO/VICODIN) 5-325 MG per tablet 1-2 tablet, 1-2 tablet, Oral, Q4H PRN, Ashok Pall, MD, 2 tablet at 01/29/20 1141 .  ibuprofen (ADVIL) tablet 400 mg, 400 mg, Oral, Q8H PRN, Consuella Lose, MD, 400 mg at 01/29/20 1353 .  loratadine  (CLARITIN) tablet 10 mg, 10 mg, Oral, QHS, Consuella Lose, MD, 10 mg at 01/28/20 2107 .  methocarbamol (ROBAXIN) tablet 500 mg, 500 mg, Oral, Q8H PRN, Ashok Pall, MD, 500 mg at 01/28/20 0808 .  niMODipine (NIMOTOP) capsule 60 mg, 60 mg, Oral, Q4H, 60 mg at 01/29/20 1617 **OR** niMODipine (NYMALIZE) 6 MG/ML oral solution 60 mg, 60 mg, Per Tube, Q4H, Consuella Lose, MD .  ondansetron (ZOFRAN-ODT) disintegrating tablet 4 mg, 4 mg, Oral, Q6H PRN, 4 mg at 01/27/20 0959 **OR** ondansetron (ZOFRAN) injection 4 mg, 4 mg, Intravenous, Q6H PRN, Consuella Lose, MD, 4 mg at 01/26/20 0805 .  pantoprazole (PROTONIX) EC tablet 40 mg, 40 mg, Oral, Daily, 40 mg at 01/29/20  1141 **OR** pantoprazole sodium (PROTONIX) 40 mg/20 mL oral suspension 40 mg, 40 mg, Per Tube, Daily, Consuella Lose, MD  Patients Current Diet:  Diet Order            Diet regular Room service appropriate? Yes; Fluid consistency: Thin  Diet effective now              Precautions / Restrictions Precautions Precautions: Fall Precaution Comments: goal SBP<140 Restrictions Weight Bearing Restrictions: No   Has the patient had 2 or more falls or a fall with injury in the past year?No  Prior Activity Level Community (5-7x/wk): retired, but still drives, runs errands, uses no AD.   Prior Functional Level Prior Function Level of Independence: Independent Comments: Driving, medications, cooking, cleaning; retired  Self Care: Did the patient need help bathing, dressing, using the toilet or eating?  Independent  Indoor Mobility: Did the patient need assistance with walking from room to room (with or without device)? Independent  Stairs: Did the patient need assistance with internal or external stairs (with or without device)? Independent  Functional Cognition: Did the patient need help planning regular tasks such as shopping or remembering to take medications? Independent  Home Assistive Devices / Equipment Home  Equipment: Tub bench  Prior Device Use: Indicate devices/aids used by the patient prior to current illness, exacerbation or injury? None of the above  Current Functional Level Cognition  Arousal/Alertness: Awake/alert Overall Cognitive Status: Impaired/Different from baseline Current Attention Level: Sustained Orientation Level: Oriented X4 General Comments: Proper sequencing with toileting (including manipulating underwear), oral care and hand-washing at sink with no cues needed Attention: Sustained Sustained Attention: Impaired Sustained Attention Impairment: Verbal basic Memory: Impaired Memory Impairment: Storage deficit, Retrieval deficit, Decreased recall of new information Awareness: Impaired Awareness Impairment: Intellectual impairment, Anticipatory impairment, Emergent impairment Problem Solving: Impaired Problem Solving Impairment: Verbal basic Safety/Judgment: Impaired    Extremity Assessment (includes Sensation/Coordination)  Upper Extremity Assessment: Generalized weakness  Lower Extremity Assessment: Defer to PT evaluation    ADLs  Overall ADL's : Needs assistance/impaired Eating/Feeding: Set up, Sitting Grooming: Wash/dry hands, Wash/dry face, Oral care, Brushing hair, Standing, Minimal assistance Grooming Details (indicate cue type and reason): encouragement to participate in multiple tasks in standing Upper Body Bathing: Set up, Sitting Lower Body Bathing: Min guard, Sitting/lateral leans, Sit to/from stand Upper Body Dressing : Min guard, Sitting Lower Body Dressing: Minimal assistance, Cueing for safety, Sitting/lateral leans, Sit to/from stand Toilet Transfer: Minimal assistance, +2 for safety/equipment, Ambulation Toilet Transfer Details (indicate cue type and reason): Min A for balance and safety during sit<>stand Toileting- Clothing Manipulation and Hygiene: Minimal assistance, Sitting/lateral lean, Sit to/from stand Toileting - Clothing Manipulation  Details (indicate cue type and reason): assist for balance while pt managing underwear and performing pericare  Functional mobility during ADLs: Minimal assistance, +2 for safety/equipment General ADL Comments: Pt performing toileting, hand hygiene, and oral care with MIn A for balance. Pt presenting with decreased safety.     Mobility  Overal bed mobility: Needs Assistance Bed Mobility: Supine to Sit, Sit to Supine Supine to sit: Supervision Sit to supine: Supervision General bed mobility comments: for lines and safety    Transfers  Overall transfer level: Needs assistance Equipment used: None Transfers: Sit to/from Stand Sit to Stand: Min assist General transfer comment: slightly unsteady, wavering upon standing; denied dizziness "feels weak"    Ambulation / Gait / Stairs / Wheelchair Mobility  Ambulation/Gait Ambulation/Gait assistance: Min assist Gait Distance (Feet): 50 Feet(x2; standing rest break) Assistive  device: IV Pole Gait Pattern/deviations: Step-through pattern, Decreased stride length, Narrow base of support, Drifts right/left General Gait Details: pt more unsteady and instructed to hold IV pole for stability (PT also holding/guiding pole); standing rest break x 1 Gait velocity: decreased    Posture / Balance Dynamic Sitting Balance Sitting balance - Comments: pt leaning to far L and R when directing PA to find something in her drawer, close mingaurd for safety when leaning outside BOS but with no LOB Balance Overall balance assessment: Needs assistance Sitting-balance support: Feet unsupported, No upper extremity supported Sitting balance-Leahy Scale: Good Sitting balance - Comments: pt leaning to far L and R when directing PA to find something in her drawer, close mingaurd for safety when leaning outside BOS but with no LOB Standing balance support: No upper extremity supported, During functional activity Standing balance-Leahy Scale: Fair Standing balance  comment: pt seeking UE support for mobility tasks    Special needs/care consideration BiPAP/CPAP: no CPM: no Continuous Drip IV: 0.9% sodium chloride infusion  Dialysis: no       Days: no Life Vest: no Oxygen: 1L/min Special Bed: no Trach Size: no Wound Vac (area): no      Location: no Skin: surgical incision                 Bowel mgmt: no BM recorded this admission Bladder mgmt:continent Diabetic mgmt: no Behavioral consideration : no Chemo/radiation : no     Previous Home Environment (from acute therapy documentation) Living Arrangements: Alone  Lives With: Alone Available Help at Discharge: Family, Friend(s) Type of Home: Apartment Home Layout: One level Home Access: Building control surveyor Shower/Tub: Chiropodist: Standard Additional Comments: [facility] [address] [facility] [place] [facility]; on 5th floor  Discharge Living Setting Plans for Discharge Living Setting: Other (Comment)(plan to go home with sister to her house at [place]) Type of Home at Discharge: House Discharge Home Layout: One level Discharge Home Access: Stairs to enter Entrance Stairs-Rails: Right Entrance Stairs-Number of Steps: 3 Discharge Bathroom Shower/Tub: Tub/shower unit, Walk-in shower Discharge Bathroom Toilet: Standard Discharge Bathroom Accessibility: Yes How Accessible: Accessible via walker Does the patient have any problems obtaining your medications?: No  Social/Family/Support Systems Patient Roles: Other (Comment)(close to sister; 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 Information: sister: Daine Floras [phone] Anticipated Caregiver: sister Anticipated Caregiver's Contact Information: see above Ability/Limitations of Caregiver: supervision Caregiver Availability: 24/7 Discharge Plan Discussed with Primary Caregiver: Yes Is Caregiver In Agreement with Plan?: Yes Does Caregiver/Family have Issues with Lodging/Transportation while Pt is in Rehab?: No   Goals/Additional  Needs Patient/Family Goal for Rehab: PT/OT/SLP: Mod I Expected length of stay: 7-10 days  Cultural Considerations: NA Dietary Needs: regular diet, thin liquids Equipment Needs: TBD Special Service Needs: TBI Team Pt/Family Agrees to Admission and willing to participate: Yes Program Orientation Provided & Reviewed with Pt/Caregiver Including Roles  & Responsibilities: Yes(reviewed with pt and her sister)  Barriers to Discharge: Home environment access/layout  Barriers to Discharge Comments: steps to enter home   Decrease burden of Care through IP rehab admission: NA   Possible need for SNF placement upon discharge:Not anticipated; pt has good social support at [place] from her sister who can provide 24/7 Supervision as needed. Pt has Mod I goals and feel this can be accomplished through CIR level program in a reasonable amount of time.    Patient Condition: This patient's medical and functional status has changed since the consult dated: 01/26/20 in which the Rehabilitation Physician determined and documented  that the patient's condition is appropriate for intensive rehabilitative care in an inpatient rehabilitation facility. See "History of Present Illness" (above) for medical update. Functional changes are: improvement in gait from Min A +2 100 feet to Min A 25 feet (x2). Patient's medical and functional status update has been discussed with the Rehabilitation physician and patient remains appropriate for inpatient rehabilitation. Will admit to inpatient rehab tomorrow, 01/31/20.  Preadmission Screen Completed By:  Raechel Ache, OT, 01/29/2020 5:58 PM  ____________    Discussed status with Dr. Letta Pate on 01/30/20 at 2:18PM and received approval for admission Saturday, 01/31/20.  Admission Coordinator:  Raechel Ache, time 2:18PM/Date 01/30/20

## 2020-01-26 NOTE — Progress Notes (Signed)
Transcranial Doppler  Date POD PCO2 HCT BP  MCA ACA PCA OPHT SIPH VERT Basilar  3/13 MR      Right  Left   *  17   *  *   *  30   20  22    75  51   *  -17   21      3/15 GC     Right  Left   *  59   *  *   *  *   12  25   38  46   *  -28              Right  Left                                             Right  Left                                             Right  Left                                            Right  Left                                            Right  Left                                        MCA = Middle Cerebral Artery      OPHT = Opthalmic Artery     BASILAR = Basilar Artery   ACA = Anterior Cerebral Artery     SIPH = Carotid Siphon PCA = Posterior Cerebral Artery   VERT = Verterbral Artery                   Normal MCA = 62+\-12 ACA = 50+\-12 PCA = 42+\-23  01/26/20 Left [place] Ration : 2.46  01/26/20 1:11 PM 01/28/20 RVT

## 2020-01-26 NOTE — Progress Notes (Signed)
Occupational Therapy Treatment Patient Details Name: Autumn Le MRN: [id number] DOB: 03-11-1946 Today's Date: 01/26/2020    History of present illness Pt is a 74 y.o. F with significant PMH of vertigo, current tobacco use and COPD who presents with confusion. CT head showing SAH about the right cerebral hemisphere and basal cisterns. CTA showing 9 mm aneurysm of R ICA and 4 mm aneurysm of right A2 ACA. S/p coil embolization of right posterior communicating artery aneurysm 3/12.   OT comments  Pt progressing towards established OT goals. Pt supine in bed and reporting need to use restroom. Pt performing toileting with functional mobility to bathroom with Min A and single hand held A. Pt performing hand hygiene and oral care at sink with Min Guard A and cues for sequencing, problem solving, and awareness. Educating pt on importance of OOB activity during day and opening window blinds for natural light; pt verbalizing understanding. Continue to recommend dc to CIR and will continue to follow acutely as admitted.    Follow Up Recommendations  CIR    Equipment Recommendations  Other (comment)(to be determined)    Recommendations for Other Services Rehab consult    Precautions / Restrictions Precautions Precautions: Fall Restrictions Weight Bearing Restrictions: No       Mobility Bed Mobility Overal bed mobility: Needs Assistance Bed Mobility: Supine to Sit     Supine to sit: Supervision     General bed mobility comments: Supervision for safety to get EOB.   Transfers Overall transfer level: Needs assistance Equipment used: 1 person hand held assist Transfers: Sit to/from Stand Sit to Stand: Min assist         General transfer comment: Min A for safety and gaining balance    Balance Overall balance assessment: Needs assistance Sitting-balance support: Feet unsupported;No upper extremity supported Sitting balance-Leahy Scale: Good     Standing balance support: No  upper extremity supported;During functional activity Standing balance-Leahy Scale: Fair Standing balance comment: stood at EOB prior to walk                           ADL either performed or assessed with clinical judgement   ADL Overall ADL's : Needs assistance/impaired     Grooming: Min guard;Oral care;Wash/dry face;Wash/dry hands;Standing Grooming Details (indicate cue type and reason): Min Guard A for safety. Cues for sequencing and safety.                  Toilet Transfer: Minimal assistance;Ambulation;Regular Teacher, adult education Details (indicate cue type and reason): Min A for balance and safety during sit<>stand Toileting- Clothing Manipulation and Hygiene: Minimal assistance;Sit to/from stand Toileting - Clothing Manipulation Details (indicate cue type and reason): Min A for balance during peri care     Functional mobility during ADLs: Minimal assistance(single hand held A) General ADL Comments: Pt performing toileting, hand hygiene, and oral care with MIn A for balance. Pt presenting with decreased safety.      Vision   Vision Assessment?: Yes Eye Alignment: Within Functional Limits Ocular Range of Motion: Within Functional Limits Alignment/Gaze Preference: Within Defined Limits Additional Comments: Will continue to assess   Perception     Praxis      Cognition Arousal/Alertness: Awake/alert Behavior During Therapy: WFL for tasks assessed/performed Overall Cognitive Status: Impaired/Different from baseline Area of Impairment: Awareness;Problem solving                 Orientation Level: (not tested)  Awareness: Intellectual Problem Solving: Slow processing;Difficulty sequencing;Requires verbal cues;Requires tactile cues General Comments: Pt able to participate in toileting and oral care at sink. Poor awareness of deficits and decreased problem solving with ADLs. Slightly impulsive.        Exercises     Shoulder  Instructions       General Comments Pt fatigues quickly but motivated to participate in therapy. VSS    Pertinent Vitals/ Pain       Pain Assessment: Faces Faces Pain Scale: Hurts little more Pain Location: neck Pain Descriptors / Indicators: Discomfort;Sore Pain Intervention(s): Monitored during session;Limited activity within patient's tolerance;Repositioned  Home Living                                          Prior Functioning/Environment              Frequency  Min 2X/week        Progress Toward Goals  OT Goals(current goals can now be found in the care plan section)  Progress towards OT goals: Progressing toward goals  Acute Rehab OT Goals Patient Stated Goal: agreeable to rehab OT Goal Formulation: With patient Time For Goal Achievement: 02/07/20 Potential to Achieve Goals: Good ADL Goals Pt Will Perform Lower Body Dressing: with min guard assist;sit to/from stand Pt Will Perform Toileting - Clothing Manipulation and hygiene: with min guard assist;sitting/lateral leans;sit to/from stand Pt/caregiver will Perform Home Exercise Program: Increased strength;Both right and left upper extremity;With Supervision Additional ADL Goal #1: Pt will follow 2 multi step commands with minimal cueing  and 90% accuracy. Additional ADL Goal #2: Pt will increase to supervisionA for OOB ADL with good safety awareness.  Plan Discharge plan remains appropriate    Co-evaluation                 AM-PAC OT "6 Clicks" Daily Activity     Outcome Measure   Help from another person eating meals?: A Little Help from another person taking care of personal grooming?: A Little Help from another person toileting, which includes using toliet, bedpan, or urinal?: A Little Help from another person bathing (including washing, rinsing, drying)?: A Lot Help from another person to put on and taking off regular upper body clothing?: A Little Help from another person to  put on and taking off regular lower body clothing?: A Little 6 Click Score: 17    End of Session Equipment Utilized During Treatment: Gait belt  OT Visit Diagnosis: Unsteadiness on feet (R26.81);Muscle weakness (generalized) (M62.81);Other symptoms and signs involving cognitive function   Activity Tolerance Patient tolerated treatment well   Patient Left in chair;with call bell/phone within reach;with chair alarm set   Nurse Communication Mobility status        Time: 9924-2683 OT Time Calculation (min): 17 min  Charges: OT General Charges $OT Visit: 1 Visit OT Treatments $Self Care/Home Management : 8-22 mins  [place], OTR/L Acute Rehab Pager: [phone] Office: [place] 01/26/2020, 4:28 PM

## 2020-01-26 NOTE — Progress Notes (Signed)
Inpatient Rehab Admissions:  Inpatient Rehab Consult received.  I met with pt at the bedside for rehabilitation assessment and to discuss goals and expectations of an inpatient rehab admission. Pt very interested in the IP Rehab program and would like to pursue. With her permission, I did speak with her sister to confirm DC support. AC will begin insurance authorization process for possible admit.   Raechel Ache, OTR/L  Rehab Admissions Coordinator  [phone] 01/26/2020 11:35 AM

## 2020-01-26 NOTE — Consult Note (Signed)
Physical Medicine and Rehabilitation Consult Reason for Consult: Altered mental status Referring Physician: Dr. Conchita Paris   HPI: Autumn Le is a 74 y.o. right-handed female with history of vertigo as well as tobacco abuse.  Per chart review patient lives alone independent prior to admission.  1 level apartment with elevator.  Presented 01/23/2020 to [facility] hospital with altered mental status as well as mild headache.  Patient could not recall full incident.  Admission chemistries unremarkable, troponin high-sensitivity 474, CK 458.  Cranial CT scan showed extensive subarachnoid hemorrhage about the right cerebral hemisphere and basal cisterns.  No significant mass-effect or midline shift.  Parenchymal hypodensity of the right temporal lobe of uncertain etiology traumatic versus infarction.  No fracture or static subluxation of the cervical spine.  CT angiogram of the chest without pulmonary emboli.  CT angiogram of head and neck approximately 9 mm aneurysm of the communicating segment of the right ICA.  Additional approximately 4 mm aneurysm of the right A2 ACA.  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 Conchita Paris.  Nimotop initiated for blood pressure control.  Tolerating a regular diet.  Therapy evaluations completed with recommendations of physical medicine rehab consult.   Review of Systems  Constitutional: Negative for chills and fever.  HENT: Negative for hearing loss.   Eyes: Negative for blurred vision and double vision.  Respiratory: Negative for cough and shortness of breath.   Cardiovascular: Negative for chest pain, palpitations and leg swelling.  Gastrointestinal: Positive for constipation. Negative for heartburn, nausea and vomiting.  Genitourinary: Negative for dysuria, flank pain and hematuria.  Musculoskeletal: Positive for joint pain and myalgias.  Skin: Negative for  rash.  Neurological: Positive for dizziness and headaches.  All other systems reviewed and are negative.  Past Medical History:  Diagnosis Date  . Vertigo    Past Surgical History:  Procedure Laterality Date  . TONSILLECTOMY     Family History  Problem Relation Age of Onset  . Breast cancer Maternal Aunt    Social History:  reports that she has been smoking. She has never used smokeless tobacco. She reports previous alcohol use. She reports previous drug use. Allergies:  Allergies  Allergen Reactions  . Latex Hives   Medications Prior to Admission  Medication Sig Dispense Refill  . acetaminophen (TYLENOL) 325 MG tablet Take 650 mg by mouth every 6 (six) hours as needed for headache (pain).    Marland Kitchen albuterol (VENTOLIN HFA) 108 (90 Base) MCG/ACT inhaler Inhale 2 puffs into the lungs every 6 (six) hours as needed for wheezing or shortness of breath.     . Calcium Carbonate (CALCIUM 600 PO) Take 600 mg by mouth at bedtime.    . Cholecalciferol 50 MCG (2000 UT) CAPS Take 2,000 Units by mouth at bedtime.     . fluticasone (FLONASE) 50 MCG/ACT nasal spray Place 2 sprays into both nostrils daily.     . fluticasone furoate-vilanterol (BREO ELLIPTA) 100-25 MCG/INH AEPB Inhale 1 puff into the lungs daily.    Marland Kitchen loratadine (CLARITIN) 10 MG tablet Take 10 mg by mouth at bedtime.       Home: Home Living Family/patient expects to be discharged to:: Private residence Living Arrangements: Alone Available Help at Discharge: Family, Available PRN/intermittently Type of Home: Apartment Home Access: Elevator Home Layout: One level Bathroom Shower/Tub: Engineer, manufacturing systems: Standard Home Equipment: Tub bench Additional Comments: [address] in  [facility] [place]; on 5th  floor  Functional History: Prior Function Level of Independence: Independent Comments: Driving, medications, cooking, cleaning; retired Functional Status:  Mobility: Bed Mobility Overal bed mobility: Needs  Assistance Bed Mobility: Supine to Sit Supine to sit: Supervision Transfers Overall transfer level: Needs assistance Equipment used: None Transfers: Sit to/from Stand Sit to Stand: Min guard General transfer comment: for safety Ambulation/Gait Ambulation/Gait assistance: Min assist, +2 physical assistance Gait Distance (Feet): 100 Feet Assistive device: None Gait Pattern/deviations: Step-through pattern, Decreased stride length, Scissoring General Gait Details: Pt requiring minA + 2 for stability, demonstrates decreased pelvic rotation, arm swing, occasional scissoring.  Gait velocity: decreased    ADL: ADL Overall ADL's : Needs assistance/impaired Eating/Feeding: Set up, Sitting Grooming: Min guard, Standing Grooming Details (indicate cue type and reason): Pt forgot that comb was available to comb hair- pt started combing hair with digits. Upper Body Bathing: Set up, Sitting Lower Body Bathing: Min guard, Sitting/lateral leans, Sit to/from stand Upper Body Dressing : Min guard, Sitting Lower Body Dressing: Minimal assistance, Cueing for safety, Sitting/lateral leans, Sit to/from stand Toilet Transfer: Minimal assistance, Cueing for safety, Ambulation Toileting- Clothing Manipulation and Hygiene: Minimal assistance, Sitting/lateral lean, Sit to/from stand, Cueing for safety, Cueing for sequencing Functional mobility during ADLs: Minimal assistance, +2 for physical assistance, Cueing for safety, Cueing for sequencing(with hand held assist) General ADL Comments: Pt with decreased safety awareness and short term memory cognitive deficits, decreased strength and decreased ability to care for self.  Cognition: Cognition Overall Cognitive Status: Impaired/Different from baseline Orientation Level: Oriented to person, Oriented to place, Oriented to time, Disoriented to situation Cognition Arousal/Alertness: Awake/alert Behavior During Therapy: WFL for tasks assessed/performed Overall  Cognitive Status: Impaired/Different from baseline Area of Impairment: Orientation, Memory, Awareness Orientation Level: Disoriented to, Place, Situation Memory: Decreased short-term memory Awareness: Intellectual General Comments: Pt with decreased orientation and inability to recongize deficits. Pt unable to recall 3 items after 3 mins and after 10 mins; immediate recall, WFLs.  Blood pressure 136/66, pulse 65, temperature 99.1 F (37.3 C), temperature source Oral, resp. rate 19, height 5\' 4"  (1.626 m), weight 55.3 kg, SpO2 95 %.  Physical Exam  General: No apparent distress; fatigued and somnolent.  HEENT: Head is normocephalic, atraumatic, PERRLA, EOMI, sclera anicteric, oral mucosa pink and moist, dentition intact, ext ear canals clear,  Neck: Supple without JVD or lymphadenopathy Heart: Reg rate and rhythm. No murmurs rubs or gallops Chest: CTA bilaterally without wheezes, rales, or rhonchi; no distress Abdomen: Soft, non-tender, non-distended, bowel sounds positive. Extremities: No clubbing, cyanosis, or edema. Pulses are 2+ Skin: Clean and intact without signs of breakdown Neuro: Patient is alert in no acute distress.  She does make eye contact with examiner and follows simple commands.  Provides her name and age but very limited medical historian cannot recall incident leading up to her hospitalization.  She appears to show some decreased awareness of her deficits. Also forgetful of the fact that she worked with therapy here. 4/5 strength throughout extremities due to diffuse weakness; no focal deficits.  Musculoskeletal: Full ROM, No pain with AROM or PROM in the neck, trunk, or extremities. Posture appropriate Psych: Pt's affect is appropriate. Pt is cooperative  No results found for this or any previous visit (from the past 24 hour(s)). VAS TRANSCRANIAL DOPPLER  Result Date: 01/24/2020  Transcranial Doppler Indications: Subarachnoid hemorrhage. Limitations for diagnostic  windows: Unable to insonate right transtemporal window. Comparison Study: no prior Performing Technologist: 01/26/2020 RVS  Examination Guidelines: A complete evaluation includes  B-mode imaging, spectral Doppler, color Doppler, and power Doppler as needed of all accessible portions of each vessel. Bilateral testing is considered an integral part of a complete examination. Limited examinations for reoccurring indications may be performed as noted.  +----------+-------------+----------+-----------+-------+ RIGHT TCD Right VM (cm)Depth (cm)PulsatilityComment +----------+-------------+----------+-----------+-------+ Opthalmic     20.00                 1.89            +----------+-------------+----------+-----------+-------+ ICA siphon    75.00                 1.62            +----------+-------------+----------+-----------+-------+  +----------+------------+----------+-----------+-------+ LEFT TCD  Left VM (cm)Depth (cm)PulsatilityComment +----------+------------+----------+-----------+-------+ MCA          17.00                 1.68            +----------+------------+----------+-----------+-------+ Term ICA     41.00                 1.39            +----------+------------+----------+-----------+-------+ PCA          30.00                 1.39            +----------+------------+----------+-----------+-------+ Opthalmic    22.00                 1.91            +----------+------------+----------+-----------+-------+ ICA siphon   51.00                 1.34            +----------+------------+----------+-----------+-------+ Vertebral    -17.00                1.35            +----------+------------+----------+-----------+-------+  +------------+-------+-------+             VM cm/sComment +------------+-------+-------+ Prox Basilar 21.00         +------------+-------+-------+    Preliminary      Assessment/Plan: Diagnosis: SAH 1. Does the need  for close, 24 hr/day medical supervision in concert with the patient's rehab needs make it unreasonable for this patient to be served in a less intensive setting? Yes 2. Co-Morbidities requiring supervision/potential complications: nausea, dizziness, headache, lightheadedness, emesis, vertigo, tobacco use, COPD 3. Due to bladder management, bowel management, safety, skin/wound care, disease management, medication administration, pain management and patient education, does the patient require 24 hr/day rehab nursing? Yes 4. Does the patient require coordinated care of a physician, rehab nurse, therapy disciplines of PT, OT, SLP to address physical and functional deficits in the context of the above medical diagnosis(es)? Yes Addressing deficits in the following areas: balance, endurance, locomotion, strength, transferring, bowel/bladder control, bathing, dressing, feeding, grooming, toileting, cognition, swallowing and psychosocial support 5. Can the patient actively participate in an intensive therapy program of at least 3 hrs of therapy per day at least 5 days per week? Yes 6. The potential for patient to make measurable gains while on inpatient rehab is excellent 7. Anticipated functional outcomes upon discharge from inpatient rehab are modified independent  with PT, modified independent with OT, modified independent with SLP. 8. Estimated rehab length of stay to reach the above functional goals is: 10-14 days 9. Anticipated discharge  destination: Home 10. Overall Rehab/Functional Prognosis: excellent  RECOMMENDATIONS: This patient's condition is appropriate for continued rehabilitative care in the following setting: CIR Patient has agreed to participate in recommended program. Yes Note that insurance prior authorization may be required for reimbursement for recommended care.  Comment: Mrs. Soliz would be an excellent CIR candidate once medially stable and no longer requiring a nicardipine drip.  She lives alone and has modI goals. She would be able to stay with her sister for some time if needed.  For nausea and dizziness, can consider scopolamine patch. For headache, can consider Topiramate 25mg  daily Thank you for this consult. We will continue to follow in Mrs. Rosiak's care.    Lavon Paganini Angiulli, PA-C 01/26/2020

## 2020-01-26 NOTE — Evaluation (Signed)
Speech Language Pathology Evaluation Patient Details Name: Autumn Le MRN: [id number] DOB: 1945/11/17 Today's Date: 01/26/2020 Time: 9702-6378 SLP Time Calculation (min) (ACUTE ONLY): 21 min  Problem List:  Patient Active Problem List   Diagnosis Date Noted  . Subarachnoid hemorrhage (HCC) 01/23/2020   Past Medical History:  Past Medical History:  Diagnosis Date  . Vertigo    Past Surgical History:  Past Surgical History:  Procedure Laterality Date  . RADIOLOGY WITH ANESTHESIA N/A 01/23/2020   Procedure: RADIOLOGY WITH ANESTHESIA;  Surgeon: Lisbeth Renshaw, MD;  Location: [facility] OR;  Service: Radiology;  Laterality: N/A;  . TONSILLECTOMY     HPI:  Pt is a 74 y.o. F with significant PMH of vertigo, current tobacco use and COPD who presents with confusion. CT head showing SAH about the right cerebral hemisphere and basal cisterns. CTA showing 9 mm aneurysm of R ICA and 4 mm aneurysm of right A2 ACA. S/p coil embolization of right posterior communicating artery aneurysm 3/12.   Assessment / Plan / Recommendation Clinical Impression   Pt presents with mild-moderate impairments in sustained attention and recall of new information, likely further exacerbated by internal distractions such as pain. Her intellectual awareness is more impaired than her emergent awareness, as she is able to identify in the moment when things are "slipping from her head " but she denies any acute changes. She was independent and living alone PTA per her report, which suggests that these are acute cognitive changes.    SLP Assessment  SLP Recommendation/Assessment: Patient needs continued Speech Lanaguage Pathology Services SLP Visit Diagnosis: Cognitive communication deficit (R41.841)    Follow Up Recommendations  Inpatient Rehab    Frequency and Duration min 2x/week  2 weeks      SLP Evaluation Cognition  Overall Cognitive Status: Impaired/Different from baseline Arousal/Alertness:  Awake/alert Orientation Level: Oriented to person;Oriented to place(pt knew her name but not her age, knew month/year) Attention: Sustained Sustained Attention: Impaired Sustained Attention Impairment: Verbal basic Memory: Impaired Memory Impairment: Storage deficit;Retrieval deficit;Decreased recall of new information Awareness: Impaired Awareness Impairment: Intellectual impairment;Anticipatory impairment;Emergent impairment Problem Solving: Impaired Problem Solving Impairment: Verbal basic Safety/Judgment: Impaired       Comprehension  Auditory Comprehension Overall Auditory Comprehension: Appears within functional limits for tasks assessed    Expression Expression Primary Mode of Expression: Verbal Verbal Expression Overall Verbal Expression: Appears within functional limits for tasks assessed   Oral / Motor  Motor Speech Overall Motor Speech: Appears within functional limits for tasks assessed   GO                     Mahala Menghini., M.A. CCC-SLP Acute Rehabilitation Services Pager [phone] Office [phone]  01/26/2020, 1:56 PM

## 2020-01-26 NOTE — Progress Notes (Signed)
  NEUROSURGERY PROGRESS NOTE   Intermittent nausea that started yesterday. Vomited during examination this am. Zofran given Otherwise HA stable No new N/T/W  EXAM:  BP 136/66 (BP Location: Right Arm)   Pulse 65   Temp 99.1 F (37.3 C) (Oral)   Resp 19   Ht 5\' 4"  (1.626 m)   Wt 55.3 kg   SpO2 95%   BMI 20.94 kg/m   Awake, alert, oriented  Speech fluent, appropriate  CN grossly intact  MAEW  IMPRESSION/PLAN 74 y.o. female SAH d#5 s/p coiling ruptured right Pcom aneurysm, has unruptured right distal ACA and left cavernous aneurysms. Neurologically stable. - continue nimotop, supportive care - TCDs today - PT/OT rec CIR. Consult placed.

## 2020-01-27 ENCOUNTER — Encounter (HOSPITAL_COMMUNITY): Payer: Self-pay | Admitting: Neurosurgery

## 2020-01-27 MED ORDER — PEDIALYTE PO SOLN
1000.0000 mL | Freq: Four times a day (QID) | ORAL | Status: DC | PRN
  Filled 2020-01-27: qty 1000

## 2020-01-27 MED ORDER — BUTALBITAL-APAP-CAFFEINE 50-325-40 MG PO TABS
1.0000 | ORAL_TABLET | ORAL | Status: DC | PRN
  Administered 2020-01-27 – 2020-01-28 (×3): 2 via ORAL
  Administered 2020-01-28: 1 via ORAL
  Administered 2020-01-28 – 2020-01-31 (×6): 2 via ORAL
  Filled 2020-01-27 (×3): qty 1
  Filled 2020-01-27: qty 2
  Filled 2020-01-27: qty 1
  Filled 2020-01-27 (×8): qty 2

## 2020-01-27 NOTE — Progress Notes (Signed)
  NEUROSURGERY PROGRESS NOTE   No issues overnight.  HA minimally relieved with Norco Would like some Pedialyte for intermittent nausea  EXAM:  BP 129/60   Pulse (!) 56   Temp 98.3 F (36.8 C) (Oral)   Resp 13   Ht 5\' 4"  (1.626 m)   Wt 55.3 kg   SpO2 93%   BMI 20.94 kg/m   Awake, alert, oriented  Speech fluent, appropriate  CN grossly intact  MAEW, nonfocal  Date POD PCO2 HCT BP  MCA ACA PCA OPHT SIPH VERT Basilar  3/13 MR      Right  Left   *  17   *  *   *  30   20  22    75  51   *  -17   21      3/15 GC     Right  Left   *  59   *  *   *  *   12  25   38  46   *  -28           IMPRESSION/PLAN 74 y.o. female SAH d#6 s/p coiling ruptured right Pcom aneurysm, has unruptured right distal ACA and left cavernous aneurysms. Neurologically stable. - continue nimotop, supportive care - PT/OT rec CIR. Consult placed. They are following - Fioricet for HA - Pedialyte ordered

## 2020-01-27 NOTE — Progress Notes (Signed)
Physical Therapy Treatment Patient Details Name: ADANA MARIK MRN: [id number] DOB: 1945/12/29 Today's Date: 01/27/2020    History of Present Illness Pt is a 74 y.o. F with significant PMH of vertigo, current tobacco use and COPD who presents with confusion. CT head showing SAH about the right cerebral hemisphere and basal cisterns. CTA showing 9 mm aneurysm of R ICA and 4 mm aneurysm of right A2 ACA. S/p coil embolization of right posterior communicating artery aneurysm 3/12.    PT Comments    Patient more steady on her feet, however experiencing increased dizziness and nausea. Very focused on her symptoms and could not progress her ambulation beyond to the bathroom and back today.    Follow Up Recommendations  CIR     Equipment Recommendations  Rolling walker with 5" wheels    Recommendations for Other Services       Precautions / Restrictions Precautions Precautions: Fall Restrictions Weight Bearing Restrictions: No    Mobility  Bed Mobility Overal bed mobility: Needs Assistance Bed Mobility: Supine to Sit     Supine to sit: Supervision     General bed mobility comments: Supervision for safety due to dizziness and tall/ICU bed  Transfers Overall transfer level: Needs assistance Equipment used: None Transfers: Sit to/from Stand Sit to Stand: Min guard         General transfer comment: Min guard A for safety due to reported dizziness; no imbalance  Ambulation/Gait Ambulation/Gait assistance: Min assist Gait Distance (Feet): 12 Feet(x 2 ) Assistive device: None Gait Pattern/deviations: Step-through pattern;Decreased stride length;Narrow base of support Gait velocity: decreased   General Gait Details: no device, pt wanting to close eyes due to dizzy/nausea and reaching for doorframe, wall, but no imbalance   Stairs             Wheelchair Mobility    Modified Rankin (Stroke Patients Only) Modified Rankin (Stroke Patients Only) Pre-Morbid Rankin  Score: No symptoms Modified Rankin: Moderately severe disability     Balance Overall balance assessment: Needs assistance Sitting-balance support: Feet unsupported;No upper extremity supported Sitting balance-Leahy Scale: Good     Standing balance support: No upper extremity supported;During functional activity Standing balance-Leahy Scale: Fair Standing balance comment: stood at EOB x 1 minute with cues to focus eyes on one spot due to dizziness; not resolving; returned to sit with no improvement                            Cognition Arousal/Alertness: Awake/alert Behavior During Therapy: WFL for tasks assessed/performed;Flat affect Overall Cognitive Status: Impaired/Different from baseline Area of Impairment: Awareness;Problem solving;Attention                 Orientation Level: (not tested) Current Attention Level: Sustained(limited due to internal distraction of dizzy/nausea)       Awareness: Intellectual Problem Solving: Slow processing;Requires verbal cues;Requires tactile cues General Comments: Proper sequencing with toileting (including manipulating underwear) and hand-washing at sink with no cues needed      Exercises      General Comments General comments (skin integrity, edema, etc.): pt very distracted by dizziness, nausea; BP stable in supine, sit and stand      Pertinent Vitals/Pain Pain Assessment: No/denies pain    Home Living                      Prior Function            PT Goals (  current goals can now be found in the care plan section) Acute Rehab PT Goals Patient Stated Goal: agreeable to rehab Time For Goal Achievement: 02/07/20 Potential to Achieve Goals: Good Progress towards PT goals: Progressing toward goals(balance improved although dizziness/nausea worse)    Frequency    Min 4X/week      PT Plan Current plan remains appropriate    Co-evaluation              AM-PAC PT "6 Clicks" Mobility    Outcome Measure  Help needed turning from your back to your side while in a flat bed without using bedrails?: None Help needed moving from lying on your back to sitting on the side of a flat bed without using bedrails?: None Help needed moving to and from a bed to a chair (including a wheelchair)?: A Little Help needed standing up from a chair using your arms (e.g., wheelchair or bedside chair)?: A Little Help needed to walk in hospital room?: A Little Help needed climbing 3-5 steps with a railing? : A Lot 6 Click Score: 19    End of Session Equipment Utilized During Treatment: Gait belt Activity Tolerance: Treatment limited secondary to medical complications (Comment) Patient left: in chair;with call bell/phone within reach;with chair alarm set Nurse Communication: Mobility status;Other (comment)(dizzy/nausea) PT Visit Diagnosis: Unsteadiness on feet (R26.81);Other abnormalities of gait and mobility (R26.89);Difficulty in walking, not elsewhere classified (R26.2)     Time: 5176-1607 PT Time Calculation (min) (ACUTE ONLY): 30 min  Charges:  $Gait Training: 8-22 mins $Therapeutic Activity: 8-22 mins                      Arby Barrette, PT Pager [phone]    Rexanne Mano 01/27/2020, 9:58 AM

## 2020-01-28 ENCOUNTER — Inpatient Hospital Stay (HOSPITAL_COMMUNITY): Payer: Medicare HMO

## 2020-01-28 DIAGNOSIS — I609 Nontraumatic subarachnoid hemorrhage, unspecified: Secondary | ICD-10-CM

## 2020-01-28 NOTE — Progress Notes (Signed)
Occupational Therapy Treatment Patient Details Name: Autumn Le MRN: [id number] DOB: Jul 09, 1946 Today's Date: 01/28/2020    History of present illness Pt is a 74 y.o. F with significant PMH of vertigo, current tobacco use and COPD who presents with confusion. CT head showing SAH about the right cerebral hemisphere and basal cisterns. CTA showing 9 mm aneurysm of R ICA and 4 mm aneurysm of right A2 ACA. S/p coil embolization of right posterior communicating artery aneurysm 3/12.   OT comments  Pt making gradual progress towards OT goals. She tolerated room level mobility, toileting and standing grooming ADL during session. Pt continues to require minA for mobility (without AD) due to unsteadiness, requiring minA for balance during ADL tasks throughout. Pt remains with flat affect and delayed processing, but was able to sequence through ADL tasks appropriately given increased time to perform. Pt fatigued with standing activity. Feel she remains appropriate for CIR level therapies at time of discharge. Will continue to follow while acutely admitted.   Follow Up Recommendations  CIR    Equipment Recommendations  Other (comment)(TBD)          Precautions / Restrictions Precautions Precautions: Fall Restrictions Weight Bearing Restrictions: No       Mobility Bed Mobility Overal bed mobility: Needs Assistance Bed Mobility: Supine to Sit     Supine to sit: Supervision     General bed mobility comments: for lines and safety  Transfers Overall transfer level: Needs assistance Equipment used: None Transfers: Sit to/from Stand Sit to Stand: Min guard         General transfer comment: Min guard A for safety due to headache; no imbalance    Balance Overall balance assessment: Needs assistance Sitting-balance support: Feet unsupported;No upper extremity supported Sitting balance-Leahy Scale: Good Sitting balance - Comments: pt leaning to far L and R when directing PA to  find something in her drawer, close mingaurd for safety when leaning outside BOS but with no LOB   Standing balance support: No upper extremity supported;During functional activity Standing balance-Leahy Scale: Fair Standing balance comment: pt seeking UE support for mobility tasks                           ADL either performed or assessed with clinical judgement   ADL Overall ADL's : Needs assistance/impaired     Grooming: Wash/dry hands;Wash/dry face;Oral care;Brushing hair;Standing;Minimal assistance Grooming Details (indicate cue type and reason): encouragement to participate in multiple tasks in standing                 Toilet Transfer: Minimal assistance;+2 for safety/equipment;Ambulation   Toileting- Clothing Manipulation and Hygiene: Minimal assistance;Sitting/lateral lean;Sit to/from stand Toileting - Clothing Manipulation Details (indicate cue type and reason): assist for balance while pt managing underwear and performing pericare      Functional mobility during ADLs: Minimal assistance;+2 for safety/equipment                 Cognition Arousal/Alertness: Awake/alert Behavior During Therapy: Flat affect Overall Cognitive Status: Impaired/Different from baseline Area of Impairment: Awareness;Problem solving;Attention                 Orientation Level: (not tested) Current Attention Level: Sustained       Awareness: Emergent Problem Solving: Slow processing;Requires verbal cues;Requires tactile cues General Comments: Proper sequencing with toileting (including manipulating underwear), oral care and hand-washing at sink with no cues needed        Exercises  Shoulder Instructions       General Comments      Pertinent Vitals/ Pain       Pain Assessment: 0-10 Pain Score: 8  Pain Location: headache Pain Descriptors / Indicators: Headache Pain Intervention(s): Limited activity within patient's tolerance;Monitored during  session;Premedicated before session  Home Living                                          Prior Functioning/Environment              Frequency  Min 2X/week        Progress Toward Goals  OT Goals(current goals can now be found in the care plan section)  Progress towards OT goals: Progressing toward goals  Acute Rehab OT Goals Patient Stated Goal: agreeable to rehab OT Goal Formulation: With patient Time For Goal Achievement: 02/07/20 Potential to Achieve Goals: Good ADL Goals Pt Will Perform Lower Body Dressing: with min guard assist;sit to/from stand Pt Will Perform Toileting - Clothing Manipulation and hygiene: with min guard assist;sitting/lateral leans;sit to/from stand Pt/caregiver will Perform Home Exercise Program: Increased strength;Both right and left upper extremity;With Supervision Additional ADL Goal #1: Pt will follow 2 multi step commands with minimal cueing  and 90% accuracy. Additional ADL Goal #2: Pt will increase to supervisionA for OOB ADL with good safety awareness.  Plan Discharge plan remains appropriate    Co-evaluation    PT/OT/SLP Co-Evaluation/Treatment: Yes Reason for Co-Treatment: To address functional/ADL transfers(to progress activity level)   OT goals addressed during session: ADL's and self-care      AM-PAC OT "6 Clicks" Daily Activity     Outcome Measure   Help from another person eating meals?: A Little Help from another person taking care of personal grooming?: A Little Help from another person toileting, which includes using toliet, bedpan, or urinal?: A Little Help from another person bathing (including washing, rinsing, drying)?: A Little Help from another person to put on and taking off regular upper body clothing?: A Little Help from another person to put on and taking off regular lower body clothing?: A Little 6 Click Score: 18    End of Session Equipment Utilized During Treatment: Gait belt  OT  Visit Diagnosis: Unsteadiness on feet (R26.81);Muscle weakness (generalized) (M62.81);Other symptoms and signs involving cognitive function   Activity Tolerance Patient tolerated treatment well   Patient Left in chair;with call bell/phone within reach;with nursing/sitter in room   Nurse Communication Mobility status        Time: 4650-3546 OT Time Calculation (min): 30 min  Charges: OT General Charges $OT Visit: 1 Visit OT Treatments $Self Care/Home Management : 8-22 mins  Marcy Siren, OT Acute Rehabilitation Services Pager [phone] Office [phone]    Orlando Penner 01/28/2020, 12:03 PM

## 2020-01-28 NOTE — Progress Notes (Signed)
Inpatient Rehabilitation-Admissions Coordinator   Per neurosurgery, pt may be medically ready for CIR by Friday. AC will follow to see if pt requires an IP Rehab stay once medically cleared.   Cheri Rous, OTR/L  Rehab Admissions Coordinator  [phone] 01/28/2020 3:37 PM

## 2020-01-28 NOTE — Progress Notes (Signed)
Physical Therapy Treatment Patient Details Name: Autumn Le MRN: [id number] DOB: Dec 18, 1945 Today's Date: 01/28/2020    History of Present Illness Pt is a 74 y.o. F with significant PMH of vertigo, current tobacco use and COPD who presents with confusion. CT head showing SAH about the right cerebral hemisphere and basal cisterns. CTA showing 9 mm aneurysm of R ICA and 4 mm aneurysm of right A2 ACA. S/p coil embolization of right posterior communicating artery aneurysm 3/12.    PT Comments    Patient remains slightly unsteady but with no overt LOB. Ambulation remains limited due to severe headache and elevating BP (per MD goal is SBP <140). She was able to be up on her feet for ~5 more minutes today compared to yesterday.     Follow Up Recommendations  CIR     Equipment Recommendations  None recommended by PT    Recommendations for Other Services       Precautions / Restrictions Precautions Precautions: Fall;Other (comment) Precaution Comments: goal SBP<140 Restrictions Weight Bearing Restrictions: No    Mobility  Bed Mobility Overal bed mobility: Needs Assistance Bed Mobility: Supine to Sit     Supine to sit: Supervision     General bed mobility comments: for lines and safety  Transfers Overall transfer level: Needs assistance Equipment used: None Transfers: Sit to/from Stand Sit to Stand: Min guard         General transfer comment: Min guard A for safety due to headache; no imbalance  Ambulation/Gait Ambulation/Gait assistance: Min assist Gait Distance (Feet): 12 Feet(x2; limited by headache and increasing BP) Assistive device: None Gait Pattern/deviations: Step-through pattern;Decreased stride length;Narrow base of support Gait velocity: decreased   General Gait Details: instructed not to reach for doorframes, etc and pt able to maintain balance during short distance ambulation   Stairs             Wheelchair Mobility    Modified Rankin  (Stroke Patients Only) Modified Rankin (Stroke Patients Only) Pre-Morbid Rankin Score: No symptoms Modified Rankin: Moderately severe disability     Balance Overall balance assessment: Needs assistance Sitting-balance support: Feet unsupported;No upper extremity supported Sitting balance-Leahy Scale: Good Sitting balance - Comments: pt leaning to far L and R when directing PA to find something in her drawer, close mingaurd for safety when leaning outside BOS but with no LOB   Standing balance support: No upper extremity supported;During functional activity Standing balance-Leahy Scale: Fair Standing balance comment: pt seeking UE support for mobility tasks                            Cognition Arousal/Alertness: Awake/alert Behavior During Therapy: Flat affect Overall Cognitive Status: Impaired/Different from baseline Area of Impairment: Awareness;Problem solving;Attention                 Orientation Level: (not tested) Current Attention Level: Sustained Memory: Decreased short-term memory     Awareness: Emergent Problem Solving: Slow processing;Requires verbal cues;Requires tactile cues General Comments: Proper sequencing with toileting (including manipulating underwear), oral care and hand-washing at sink with no cues needed      Exercises      General Comments General comments (skin integrity, edema, etc.): continued severe headache is typical per PA; SBP increased from 95 to 131 with limited activity; further activity deferred       Pertinent Vitals/Pain Pain Assessment: 0-10 Pain Score: 8  Pain Location: headache Pain Descriptors / Indicators: Headache Pain Intervention(s):  Limited activity within patient's tolerance;Monitored during session;Premedicated before session    Home Living                      Prior Function            PT Goals (current goals can now be found in the care plan section) Acute Rehab PT Goals Patient Stated  Goal: agreeable to rehab Time For Goal Achievement: 02/07/20 Potential to Achieve Goals: Good Progress towards PT goals: Not progressing toward goals - comment(limited by severe headache and incr BP)    Frequency    Min 4X/week      PT Plan Current plan remains appropriate    Co-evaluation PT/OT/SLP Co-Evaluation/Treatment: Yes Reason for Co-Treatment: To address functional/ADL transfers;Complexity of the patient's impairments (multi-system involvement) PT goals addressed during session: Mobility/safety with mobility;Balance OT goals addressed during session: ADL's and self-care      AM-PAC PT "6 Clicks" Mobility   Outcome Measure  Help needed turning from your back to your side while in a flat bed without using bedrails?: None Help needed moving from lying on your back to sitting on the side of a flat bed without using bedrails?: None Help needed moving to and from a bed to a chair (including a wheelchair)?: A Little Help needed standing up from a chair using your arms (e.g., wheelchair or bedside chair)?: A Little Help needed to walk in hospital room?: A Little Help needed climbing 3-5 steps with a railing? : A Lot 6 Click Score: 19    End of Session Equipment Utilized During Treatment: Gait belt Activity Tolerance: Treatment limited secondary to medical complications (Comment) Patient left: in chair;with call bell/phone within reach;with nursing/sitter in room Nurse Communication: Mobility status PT Visit Diagnosis: Unsteadiness on feet (R26.81);Other abnormalities of gait and mobility (R26.89);Difficulty in walking, not elsewhere classified (R26.2)     Time: 5035-4656 PT Time Calculation (min) (ACUTE ONLY): 30 min  Charges:  $Gait Training: 8-22 mins                      Arby Barrette, PT Pager [phone]    Autumn Le 01/28/2020, 12:56 PM

## 2020-01-28 NOTE — Progress Notes (Signed)
Transcranial Doppler  Date POD PCO2 HCT BP  MCA ACA PCA OPHT SIPH VERT Basilar  3/13 MR      Right  Left   *  17   *  *   *  30   20  22    75  51   *  -17   21      3/15 GC     Right  Left   *  59   *  *   *  *   12  25   38  46   *  -28         3/17 MS     Right  Left   *  54   *  *   26  *   30  19   *  *   *  *   *            Right  Left                                             Right  Left                                            Right  Left                                            Right  Left                                        MCA = Middle Cerebral Artery      OPHT = Opthalmic Artery     BASILAR = Basilar Artery   ACA = Anterior Cerebral Artery     SIPH = Carotid Siphon PCA = Posterior Cerebral Artery   VERT = Verterbral Artery                   Normal MCA = 62+\-12 ACA = 50+\-12 PCA = 42+\-23   01/26/20 Left Lindegaard Ratio : 2.46 01/28/2020 Left Lindegaard ratio= 1.86 *unable to insonate  01/28/2020 2:31 PM 01/30/2020, MHA, RVT, RDCS, RDMS

## 2020-01-28 NOTE — Progress Notes (Signed)
  NEUROSURGERY PROGRESS NOTE   No issues overnight.  Fioricet working better for HA No new N/T/W  EXAM:  BP (!) 152/63 (BP Location: Right Arm)   Pulse 63   Temp 98 F (36.7 C) (Oral)   Resp 12   Ht 5\' 4"  (1.626 m)   Wt 55.3 kg   SpO2 96%   BMI 20.94 kg/m   Awake, alert, oriented  Speech fluent, appropriate  CN grossly intact  MAEW, nonfocal  IMPRESSION/PLAN 74 y.o. female SAH d#7s/p coiling ruptured right Pcom aneurysm, has unruptured right distal ACA and left cavernous aneurysms. Neurologically stable. - continue nimotop, supportive care - Approval for CIR from insurance. Estimate Friday for medical readiness - TCDs today

## 2020-01-29 NOTE — Progress Notes (Signed)
Physical Therapy Treatment Patient Details Name: Autumn Le MRN: [id number] DOB: [birth year] Today's Date: 01/29/2020    History of Present Illness Pt is a 74 y.o. F with significant PMH of vertigo, current tobacco use and COPD who presents with confusion. CT head showing SAH about the right cerebral hemisphere and basal cisterns. CTA showing 9 mm aneurysm of R ICA and 4 mm aneurysm of right A2 ACA. S/p coil embolization of right posterior communicating artery aneurysm 3/12.    PT Comments    Patient's BP stayed in 120s during session and pt able to walk with min assist 50 ft x 2 (rest between). She continues with significant head and neck pain, however willing to work towards increased strength and independence. Due to impaired balance, incr risk of falling, and impaired cognition continue to feel CIR most appropriate discharge plan.    Follow Up Recommendations  CIR     Equipment Recommendations  None recommended by PT    Recommendations for Other Services       Precautions / Restrictions Precautions Precautions: Fall Precaution Comments: goal SBP<140 Restrictions Weight Bearing Restrictions: No    Mobility  Bed Mobility Overal bed mobility: Needs Assistance Bed Mobility: Supine to Sit;Sit to Supine     Supine to sit: Supervision Sit to supine: Supervision   General bed mobility comments: for lines and safety  Transfers Overall transfer level: Needs assistance Equipment used: None Transfers: Sit to/from Stand Sit to Stand: Min assist         General transfer comment: slightly unsteady, wavering upon standing; denied dizziness "feels weak"  Ambulation/Gait Ambulation/Gait assistance: Min assist Gait Distance (Feet): 50 Feet(x2; standing rest break) Assistive device: IV Pole Gait Pattern/deviations: Step-through pattern;Decreased stride length;Narrow base of support;Drifts right/left Gait velocity: decreased   General Gait Details: pt more unsteady and  instructed to hold IV pole for stability (PT also holding/guiding pole); standing rest break x 1   Stairs             Wheelchair Mobility    Modified Rankin (Stroke Patients Only) Modified Rankin (Stroke Patients Only) Pre-Morbid Rankin Score: No symptoms Modified Rankin: Moderately severe disability     Balance Overall balance assessment: Needs assistance Sitting-balance support: Feet unsupported;No upper extremity supported Sitting balance-Leahy Scale: Good     Standing balance support: No upper extremity supported;During functional activity Standing balance-Leahy Scale: Fair Standing balance comment: pt seeking UE support for mobility tasks                            Cognition Arousal/Alertness: Awake/alert Behavior During Therapy: Flat affect Overall Cognitive Status: Impaired/Different from baseline Area of Impairment: Awareness;Problem solving;Attention                 Orientation Level: (not tested) Current Attention Level: Sustained Memory: Decreased short-term memory     Awareness: Emergent Problem Solving: Slow processing;Requires verbal cues;Requires tactile cues;Decreased initiation        Exercises      General Comments General comments (skin integrity, edema, etc.): SBP 120s pre and post-activity      Pertinent Vitals/Pain Pain Assessment: 0-10 Pain Score: 7  Pain Location: head/neck Pain Descriptors / Indicators: Aching;Discomfort Pain Intervention(s): Limited activity within patient's tolerance;Monitored during session;Premedicated before session    Home Living                      Prior Function  PT Goals (current goals can now be found in the care plan section) Acute Rehab PT Goals Patient Stated Goal: agreeable to rehab Time For Goal Achievement: 02/07/20 Potential to Achieve Goals: Good Progress towards PT goals: Progressing toward goals    Frequency    Min 4X/week      PT Plan  Current plan remains appropriate    Co-evaluation              AM-PAC PT "6 Clicks" Mobility   Outcome Measure  Help needed turning from your back to your side while in a flat bed without using bedrails?: None Help needed moving from lying on your back to sitting on the side of a flat bed without using bedrails?: None Help needed moving to and from a bed to a chair (including a wheelchair)?: A Little Help needed standing up from a chair using your arms (e.g., wheelchair or bedside chair)?: A Little Help needed to walk in hospital room?: A Little Help needed climbing 3-5 steps with a railing? : A Lot 6 Click Score: 19    End of Session Equipment Utilized During Treatment: Gait belt Activity Tolerance: Treatment limited secondary to medical complications (Comment);Patient limited by fatigue;Patient limited by pain(headache) Patient left: with call bell/phone within reach;in bed;with bed alarm set Nurse Communication: Mobility status PT Visit Diagnosis: Unsteadiness on feet (R26.81);Other abnormalities of gait and mobility (R26.89);Difficulty in walking, not elsewhere classified (R26.2)     Time: 6659-9357 PT Time Calculation (min) (ACUTE ONLY): 32 min  Charges:  $Gait Training: 23-37 mins                      Jerolyn Center, PT Pager [phone]    Zena Amos 01/29/2020, 5:21 PM

## 2020-01-29 NOTE — Progress Notes (Signed)
  Speech Language Pathology Treatment: Cognitive-Linquistic  Patient Details Name: Autumn Le MRN: [id number] DOB: 04-15-1946 Today's Date: 01/29/2020 Time: 6384-5364 SLP Time Calculation (min) (ACUTE ONLY): 14 min  Assessment / Plan / Recommendation Clinical Impression  Autumn Le seen for cognitive linguistic tx. Upon arrival, Autumn Le complaining of significant head/neck pain, but agreeable to session. RN aware and gave meds prior to session. Autumn Le continues to demonstrate mild-moderate impairments across the following areas: memory (working/short-term), sustained attention and awareness. During a money management task, she was able to recall items pertaining to the task with Mod cues to maintain her sustained attention, given internal distraction from pain. Autumn Le's awareness continues to be impaired on all levels, as she is unaware of any current deficits following hospital stay. Autumn Le continues to remain appropriate for CIR.     HPI HPI: Autumn Le is a 74 y.o. F with significant PMH of vertigo, current tobacco use and COPD who presents with confusion. CT head showing SAH about the right cerebral hemisphere and basal cisterns. CTA showing 9 mm aneurysm of R ICA and 4 mm aneurysm of right A2 ACA. S/p coil embolization of right posterior communicating artery aneurysm 3/12.      SLP Plan  Continue with current plan of care       Recommendations                   Oral Care Recommendations: Oral care BID Follow up Recommendations: Inpatient Rehab SLP Visit Diagnosis: Cognitive communication deficit (W80.321) Plan: Continue with current plan of care       GO              Maudry Mayhew, Student SLP Office: [phone]  01/29/2020, 11:18 AM

## 2020-01-29 NOTE — Progress Notes (Signed)
  NEUROSURGERY PROGRESS NOTE   No issues overnight. Continues to have HA, fluctuates in intensity Also complaining of neck pain today No new N/T/W. No changes in vision  EXAM:  BP (!) 146/55   Pulse 68   Temp 99.6 F (37.6 C) (Oral)   Resp 15   Ht 5\' 4"  (1.626 m)   Wt 55.3 kg   SpO2 99%   BMI 20.94 kg/m   Awake, alert, oriented  Speech fluent, appropriate  CN grossly intact  MAEW, nonfocal No drift  Date POD PCO2 HCT BP  MCA ACA PCA OPHT SIPH VERT Basilar  3/13 MR      Right  Left   *  17   *  *   *  30   20  22    75  51   *  -17   21      3/15 GC     Right  Left   *  59   *  *   *  *   12  25   38  46   *  -28         3/17 MS     Right  Left   *  54   *  *   26  *   30  19   *  *   *  *   *        IMPRESSION/PLAN 74 y.o. female SAHd#8 s/p coiling ruptured right Pcom aneurysm, has unruptured right distal ACA and left cavernous aneurysms. Neurologically stable. - continue nimotop, supportive care - medrol dose pack for HA and neck pain - TCDs limited but look fine - Approved for CIR by insurance. Estimate tomorrow for medical readiness

## 2020-01-30 ENCOUNTER — Inpatient Hospital Stay (HOSPITAL_COMMUNITY): Payer: Medicare HMO

## 2020-01-30 DIAGNOSIS — I609 Nontraumatic subarachnoid hemorrhage, unspecified: Secondary | ICD-10-CM

## 2020-01-30 MED ORDER — NIMODIPINE 30 MG PO CAPS: 60.0000 mg | ORAL_CAPSULE | ORAL | 0 refills | Status: DC

## 2020-01-30 MED ORDER — METHYLPREDNISOLONE 4 MG PO TBPK
4.0000 mg | ORAL_TABLET | ORAL | Status: DC
  Administered 2020-01-30: 4 mg via ORAL

## 2020-01-30 MED ORDER — METHYLPREDNISOLONE 4 MG PO TBPK
8.0000 mg | ORAL_TABLET | Freq: Every evening | ORAL | Status: DC
  Administered 2020-01-30: 8 mg via ORAL

## 2020-01-30 MED ORDER — METHYLPREDNISOLONE 4 MG PO TBPK: ORAL_TABLET | ORAL | 0 refills | Status: DC

## 2020-01-30 MED ORDER — METHYLPREDNISOLONE 4 MG PO TBPK: 4.0000 mg | ORAL_TABLET | Freq: Four times a day (QID) | ORAL | Status: DC

## 2020-01-30 MED ORDER — METHYLPREDNISOLONE 4 MG PO TBPK
4.0000 mg | ORAL_TABLET | Freq: Three times a day (TID) | ORAL | Status: DC
  Administered 2020-01-31 (×2): 4 mg via ORAL

## 2020-01-30 MED ORDER — METHYLPREDNISOLONE 4 MG PO TBPK: 8.0000 mg | ORAL_TABLET | Freq: Every evening | ORAL | Status: DC

## 2020-01-30 MED ORDER — METHYLPREDNISOLONE 4 MG PO TBPK
8.0000 mg | ORAL_TABLET | Freq: Every morning | ORAL | Status: AC
  Administered 2020-01-30: 12:00:00 8 mg via ORAL
  Filled 2020-01-30: qty 21

## 2020-01-30 NOTE — Progress Notes (Signed)
Inpatient Rehabilitation-Admissions Coordinator   Met with patient bedside. Pt is still interested in the IP Rehab program and would like to proceed. I confirmed DC plans with her sister. Reviewed consent forms and insurance benefit letter with pt.   I have medical approval from attending service for admit to CIR Saturday 01/30/18. Our PM&R MD, Dr. Kirsteins, will see the patient Saturday morning to confirm readiness. Please have bedside RN call [phone] to give report by noon.   Current RN and TOC team aware of plan for admit 01/31/20 to CIR.   Kelly Gentry, OTR/L  Rehab Admissions Coordinator  [phone] 01/30/2020 2:10 PM  

## 2020-01-30 NOTE — Progress Notes (Signed)
  NEUROSURGERY PROGRESS NOTE   No issues overnight.  Continued HA as expected without worsening.  No new N/T/W  EXAM:  BP (!) 145/66   Pulse (!) 59   Temp 98.3 F (36.8 C) (Oral)   Resp 18   Ht 5\' 4"  (1.626 m)   Wt 55.3 kg   SpO2 94%   BMI 20.94 kg/m   Awake, alert, oriented  Speech fluent, appropriate  CN grossly intact  MAEW, nonfocal No drift  IMPRESSION/PLAN 74 y.o. female SAHd#9 s/p coiling ruptured right Pcom aneurysm, has unruptured right distal ACA and left cavernous aneurysms. Neurologically stable. - continue nimotop, supportive care - medrol dose pack for HA and neck pain - TCDs today. If they look good, she can go to CIR today. Will prepare discharge.

## 2020-01-30 NOTE — Progress Notes (Signed)
Transcranial Doppler  Date POD PCO2 HCT BP  MCA ACA PCA OPHT SIPH VERT Basilar  3/13 MR      Right  Left   *  17   *  *   *  30   20  22    75  51   *  -17   21      3/15 GC     Right  Left   *  59   *  *   *  *   12  25   38  46   *  -28         3/17 MS     Right  Left   *  54   *  *   26  *   30  19   *  *   *  *   *      3/19 MS      Right  Left   *  51   *  *   27  *   22  18   *  *   *  -42   -56            Right  Left                                            Right  Left                                            Right  Left                                        MCA = Middle Cerebral Artery      OPHT = Opthalmic Artery     BASILAR = Basilar Artery   ACA = Anterior Cerebral Artery     SIPH = Carotid Siphon PCA = Posterior Cerebral Artery   VERT = Verterbral Artery                   Normal MCA = 62+\-12 ACA = 50+\-12 PCA = 42+\-23   01/26/20 Left Lindegaard Ratio : 2.46 01/28/2020 Left Lindegaard ratio= 1.86 *unable to insonate  01/30/2020 Left Lindegaard ratio= 1.55 *Unable to insonate.  01/30/2020 12:18 PM 02/01/2020, MHA, RVT, RDCS, RDMS

## 2020-01-30 NOTE — Discharge Summary (Signed)
Physician Discharge Summary  Patient ID: Autumn Le MRN: [id number] DOB/AGE: 11-29-1945 74 y.o.  Admit date: 01/23/2020 Discharge date: 01/30/2020  Admission Diagnoses:  SAH ruptured right Pcom aneurysm unruptured right distal ACA aneurysm Unruptured left cavernous aneurysms  Discharge Diagnoses:  Same Active Problems:   SAH (subarachnoid hemorrhage) [facility])  Discharged Condition: Stable  Hospital Course:  Autumn Le is a 74 y.o. female who presented to [facility] ED 01/23/2020 after waking up disoriented.  She says she went to bed Wednesday night, and essentially does not remember all day yesterday or today.  Because of her memory loss she presented to the emergency department.  She underwent work up which included head CT and subsequent head CTA which revealed a 9 mm right posterior communicating artery aneurysm as a likely source of hemorrhage.  She has an unruptured distal right ACA aneurysm as well. She was admitted to [facility] for further work up and treatment.  She underwent diagnostic cerebral angiogram and treatment of the right posterior communicating artery aneurysm via coil embolization on 3/12 by DR nundkumar. Her postoperative course was unremarkable. She remained neurologically intact. She underwent serial TCDs and was without evidence of vasospasm. She will need to complete 21 day coruse of nimotop for vasospasm prevention.  She worked with PT/OT/SLP who rec CIR. Insurance Berkley Harvey was obtained on 3/19 and patient was discharged to [facility] for rehab.  At time of discharge, pain was well controlled, ambulating, tolerating po, voiding normal.   Treatments: Surgery Diagnostic cerebral angiogram, coil embolization of right posterior communicating artery aneurysm  Discharge Exam: Blood pressure (!) 145/66, pulse (!) 59, temperature 98.3 F (36.8 C), temperature source Oral, resp. rate 18, height 5\' 4"  (1.626 m), weight 55.3 kg, SpO2 94 %. Awake, alert, oriented Speech fluent,  appropriate CN grossly intact 5/5 BUE/BLE Wound c/d/i  Disposition: Discharge disposition: 01-Home or Self Care       Discharge Instructions    Call MD for:  difficulty breathing, headache or visual disturbances   Complete by: As directed    Call MD for:  persistant dizziness or light-headedness   Complete by: As directed    Call MD for:  redness, tenderness, or signs of infection (pain, swelling, redness, odor or green/yellow discharge around incision site)   Complete by: As directed    Call MD for:  severe uncontrolled pain   Complete by: As directed    Call MD for:  temperature >100.4   Complete by: As directed      Allergies as of 01/30/2020      Reactions   Latex Hives      Medication List    TAKE these medications   acetaminophen 325 MG tablet Commonly known as: TYLENOL Take 650 mg by mouth every 6 (six) hours as needed for headache (pain).   albuterol 108 (90 Base) MCG/ACT inhaler Commonly known as: VENTOLIN HFA Inhale 2 puffs into the lungs every 6 (six) hours as needed for wheezing or shortness of breath.   Breo Ellipta 100-25 MCG/INH Aepb Generic drug: fluticasone furoate-vilanterol Inhale 1 puff into the lungs daily.   CALCIUM 600 PO Take 600 mg by mouth at bedtime.   Cholecalciferol 50 MCG (2000 UT) Caps Take 2,000 Units by mouth at bedtime.   fluticasone 50 MCG/ACT nasal spray Commonly known as: FLONASE Place 2 sprays into both nostrils daily.   loratadine 10 MG tablet Commonly known as: CLARITIN Take 10 mg by mouth at bedtime.   methylPREDNISolone 4 MG Tbpk tablet Commonly  known as: MEDROL DOSEPAK Take according to package insert   niMODipine 30 MG capsule Commonly known as: NIMOTOP Take 2 capsules (60 mg total) by mouth every 4 (four) hours for 14 days.      Follow-up Information    Lisbeth Renshaw, MD. Schedule an appointment as soon as possible for a visit in 3 week(s).   Specialty: Neurosurgery Contact information: [address] [place] [address] [phone]           Signed: Alyson Ingles 01/30/2020, 9:15 AM

## 2020-01-30 NOTE — Progress Notes (Signed)
Occupational Therapy Treatment Patient Details Name: Autumn Le MRN: [id number] DOB: [birth year] Today's Date: 01/30/2020    History of present illness Pt is a 74 y.o. F with significant PMH of vertigo, current tobacco use and COPD who presents with confusion. CT head showing SAH about the right cerebral hemisphere and basal cisterns. CTA showing 9 mm aneurysm of R ICA and 4 mm aneurysm of right A2 ACA. S/p coil embolization of right posterior communicating artery aneurysm 3/12.   OT comments  Pt progressing towards established goals and continues to be highly motivated to participate in therapy despite pain and fatigue. Pt performing grooming at sink with Min Guard-Min A for balance in standing and then requiring to sit due to elevate HR. Pt finishing grooming at sink seated with set up and supervision. Pt performing toileting at [facility] with Min A and changing underwear with Min A for standing balance. Continue to recommend dc to CIR and will continue to follow acutely as admitted.    Follow Up Recommendations  CIR    Equipment Recommendations  Other (comment)(TBD)    Recommendations for Other Services Rehab consult    Precautions / Restrictions Precautions Precautions: Fall Restrictions Weight Bearing Restrictions: No       Mobility Bed Mobility Overal bed mobility: Needs Assistance Bed Mobility: Supine to Sit;Sit to Supine     Supine to sit: Supervision Sit to supine: Supervision   General bed mobility comments: Supervision for safety  Transfers Overall transfer level: Needs assistance Equipment used: None Transfers: Sit to/from Stand Sit to Stand: Min assist         General transfer comment: Min A for balance in standing    Balance Overall balance assessment: Needs assistance Sitting-balance support: Feet unsupported;No upper extremity supported Sitting balance-Leahy Scale: Good Sitting balance - Comments: pt leaning to far L and R when directing PA to find  something in her drawer, close mingaurd for safety when leaning outside BOS but with no LOB   Standing balance support: No upper extremity supported;During functional activity Standing balance-Leahy Scale: Fair                             ADL either performed or assessed with clinical judgement   ADL Overall ADL's : Needs assistance/impaired     Grooming: Oral care;Brushing hair;Wash/dry face;Supervision/safety;Set up;Sitting Grooming Details (indicate cue type and reason): Pt standing at sink performing oral care and reporting fatigue but wanting to continue to stand. However, pt HR elevating to 160s and then 170s-189. Pt sitting at [facility] and HR returning quickly to 80s. Pt completing oral care, washing her face, and brushing her hair with significant time for processing and fatigue.              Lower Body Dressing: Minimal assistance;Sit to/from stand Lower Body Dressing Details (indicate cue type and reason): Pt donning new underwear with Min A for safety in standing Toilet Transfer: Minimal assistance;Ambulation;BSC Toilet Transfer Details (indicate cue type and reason): Min A for balance Toileting- Clothing Manipulation and Hygiene: Sitting/lateral lean;Min guard Toileting - Clothing Manipulation Details (indicate cue type and reason): Pt performing peri care with lateral leaning     Functional mobility during ADLs: Minimal assistance General ADL Comments: Pt performing grooming, toileting, and LB dressing. Requiring increased time for fatigue and processing. Reporting increased fatigue and needing seated rest breaks throughout     Vision   Additional Comments: Will continue to follow acutely  Perception     Praxis      Cognition Arousal/Alertness: Awake/alert Behavior During Therapy: WFL for tasks assessed/performed Overall Cognitive Status: Impaired/Different from baseline Area of Impairment: Awareness;Problem solving;Attention                    Current Attention Level: Sustained Memory: Decreased short-term memory     Awareness: Emergent Problem Solving: Slow processing;Requires verbal cues;Requires tactile cues;Decreased initiation General Comments: Pt requiring increased time for processing. Some tangiental thoughts. Also feel cognition impacting by pain and fatigue.         Exercises     Shoulder Instructions       General Comments During oral care, HR elevating to 189. Notified RN    Pertinent Vitals/ Pain       Pain Assessment: Faces Faces Pain Scale: Hurts little more Pain Location: head/neck Pain Descriptors / Indicators: Aching;Discomfort;Headache Pain Intervention(s): Monitored during session;Limited activity within patient's tolerance;Repositioned;Premedicated before session  Home Living                                          Prior Functioning/Environment              Frequency  Min 2X/week        Progress Toward Goals  OT Goals(current goals can now be found in the care plan section)  Progress towards OT goals: Progressing toward goals  Acute Rehab OT Goals Patient Stated Goal: agreeable to rehab OT Goal Formulation: With patient Time For Goal Achievement: 02/07/20 Potential to Achieve Goals: Good ADL Goals Pt Will Perform Lower Body Dressing: with min guard assist;sit to/from stand Pt Will Perform Toileting - Clothing Manipulation and hygiene: with min guard assist;sitting/lateral leans;sit to/from stand Pt/caregiver will Perform Home Exercise Program: Increased strength;Both right and left upper extremity;With Supervision Additional ADL Goal #1: Pt will follow 2 multi step commands with minimal cueing  and 90% accuracy. Additional ADL Goal #2: Pt will increase to supervisionA for OOB ADL with good safety awareness.  Plan Discharge plan remains appropriate    Co-evaluation                 AM-PAC OT "6 Clicks" Daily Activity     Outcome Measure   Help  from another person eating meals?: A Little Help from another person taking care of personal grooming?: A Little Help from another person toileting, which includes using toliet, bedpan, or urinal?: A Little Help from another person bathing (including washing, rinsing, drying)?: A Little Help from another person to put on and taking off regular upper body clothing?: A Little Help from another person to put on and taking off regular lower body clothing?: A Little 6 Click Score: 18    End of Session    OT Visit Diagnosis: Unsteadiness on feet (R26.81);Muscle weakness (generalized) (M62.81);Other symptoms and signs involving cognitive function   Activity Tolerance Patient tolerated treatment well   Patient Left with call bell/phone within reach;in bed;with bed alarm set   Nurse Communication Mobility status        Time: 2841-3244 OT Time Calculation (min): 28 min  Charges: OT General Charges $OT Visit: 1 Visit OT Treatments $Self Care/Home Management : 23-37 mins  [place], OTR/L Acute Rehab Pager: [phone] Office: [place] 01/30/2020, 10:12 AM

## 2020-01-30 NOTE — Progress Notes (Signed)
Physical Therapy Treatment Patient Details Name: Autumn Le MRN: [id number] DOB: 04-Sep-1946 Today's Date: 01/30/2020    History of Present Illness Pt is a 74 y.o. F with significant PMH of vertigo, current tobacco use and COPD who presents with confusion. CT head showing SAH about the right cerebral hemisphere and basal cisterns. CTA showing 9 mm aneurysm of R ICA and 4 mm aneurysm of right A2 ACA. S/p coil embolization of right posterior communicating artery aneurysm 3/12.    PT Comments    Pt received in bed, agreeable to participation in therapy. She required supervision bed mobility, min assist sit to stand and min assist ambulation 25' x 2 using IV pole for support. She presents with unsteady gait requiring assist to maintain balance. Pt in recliner with feet elevated at end of session.    Follow Up Recommendations  CIR     Equipment Recommendations  None recommended by PT    Recommendations for Other Services       Precautions / Restrictions Precautions Precautions: Fall Precaution Comments: goal SBP<140 Restrictions Weight Bearing Restrictions: No    Mobility  Bed Mobility Overal bed mobility: Needs Assistance Bed Mobility: Supine to Sit     Supine to sit: Supervision;HOB elevated Sit to supine: Supervision   General bed mobility comments: +rail, increased time, supervision for safety  Transfers Overall transfer level: Needs assistance Equipment used: None Transfers: Sit to/from Stand Sit to Stand: Min assist         General transfer comment: Min A for balance in standing  Ambulation/Gait Ambulation/Gait assistance: Min assist Gait Distance (Feet): 25 Feet(x 2) Assistive device: IV Pole Gait Pattern/deviations: Step-through pattern;Decreased stride length;Drifts right/left;Narrow base of support Gait velocity: decreased   General Gait Details: unsteady gait, assist to maintain balance, directional cues needed   Stairs              Wheelchair Mobility    Modified Rankin (Stroke Patients Only) Modified Rankin (Stroke Patients Only) Pre-Morbid Rankin Score: No symptoms Modified Rankin: Moderately severe disability     Balance Overall balance assessment: Needs assistance Sitting-balance support: Feet supported;No upper extremity supported Sitting balance-Leahy Scale: Good Sitting balance - Comments: pt leaning to far L and R when directing PA to find something in her drawer, close mingaurd for safety when leaning outside BOS but with no LOB   Standing balance support: No upper extremity supported;During functional activity Standing balance-Leahy Scale: Fair Standing balance comment: assist with dynamic balance                            Cognition Arousal/Alertness: Awake/alert Behavior During Therapy: WFL for tasks assessed/performed Overall Cognitive Status: Impaired/Different from baseline Area of Impairment: Awareness;Problem solving;Attention;Safety/judgement                   Current Attention Level: Sustained Memory: Decreased short-term memory   Safety/Judgement: Decreased awareness of safety Awareness: Emergent Problem Solving: Slow processing;Requires verbal cues;Requires tactile cues;Decreased initiation General Comments: increased time      Exercises      General Comments General comments (skin integrity, edema, etc.): BP 144/65 in recliner at end of session      Pertinent Vitals/Pain Pain Assessment: Faces Faces Pain Scale: Hurts little more Pain Location: headache Pain Descriptors / Indicators: Aching;Discomfort;Headache Pain Intervention(s): Monitored during session;Repositioned;Premedicated before session    Home Living  Prior Function            PT Goals (current goals can now be found in the care plan section) Acute Rehab PT Goals Patient Stated Goal: agreeable to rehab Progress towards PT goals: Progressing toward  goals    Frequency    Min 4X/week      PT Plan Current plan remains appropriate    Co-evaluation              AM-PAC PT "6 Clicks" Mobility   Outcome Measure  Help needed turning from your back to your side while in a flat bed without using bedrails?: None Help needed moving from lying on your back to sitting on the side of a flat bed without using bedrails?: None Help needed moving to and from a bed to a chair (including a wheelchair)?: A Little Help needed standing up from a chair using your arms (e.g., wheelchair or bedside chair)?: A Little Help needed to walk in hospital room?: A Little Help needed climbing 3-5 steps with a railing? : A Lot 6 Click Score: 19    End of Session Equipment Utilized During Treatment: Gait belt Activity Tolerance: Patient tolerated treatment well Patient left: in chair;with call bell/phone within reach;with chair alarm set   PT Visit Diagnosis: Unsteadiness on feet (R26.81);Other abnormalities of gait and mobility (R26.89);Difficulty in walking, not elsewhere classified (R26.2)     Time: 8250-5397 PT Time Calculation (min) (ACUTE ONLY): 17 min  Charges:                        Lorrin Goodell, PT  Office # [phone] Pager [phone]    Lorriane Shire 01/30/2020, 12:23 PM

## 2020-01-31 ENCOUNTER — Inpatient Hospital Stay (HOSPITAL_COMMUNITY): Admission: RE | Admit: 2020-01-31 | Payer: Medicare HMO | Source: Intra-hospital | Admitting: Internal Medicine

## 2020-01-31 ENCOUNTER — Inpatient Hospital Stay (HOSPITAL_COMMUNITY)
Admission: RE | Admit: 2020-01-31 | Discharge: 2020-02-12 | DRG: 057 | Disposition: A | Discharge status: Home Health | Payer: Medicare HMO | Source: Intra-hospital | Attending: Physical Medicine & Rehabilitation | Admitting: Physical Medicine & Rehabilitation

## 2020-01-31 ENCOUNTER — Encounter (HOSPITAL_COMMUNITY): Payer: Self-pay | Admitting: Physical Medicine & Rehabilitation

## 2020-01-31 ENCOUNTER — Other Ambulatory Visit: Payer: Self-pay

## 2020-01-31 DIAGNOSIS — I952 Hypotension due to drugs: Secondary | ICD-10-CM

## 2020-01-31 DIAGNOSIS — I609 Nontraumatic subarachnoid hemorrhage, unspecified: Secondary | ICD-10-CM | POA: Diagnosis not present

## 2020-01-31 DIAGNOSIS — I10 Essential (primary) hypertension: Secondary | ICD-10-CM | POA: Diagnosis not present

## 2020-01-31 DIAGNOSIS — I69018 Other symptoms and signs involving cognitive functions following nontraumatic subarachnoid hemorrhage: Secondary | ICD-10-CM

## 2020-01-31 DIAGNOSIS — Z7951 Long term (current) use of inhaled steroids: Secondary | ICD-10-CM | POA: Diagnosis not present

## 2020-01-31 DIAGNOSIS — I69098 Other sequelae following nontraumatic subarachnoid hemorrhage: Secondary | ICD-10-CM | POA: Diagnosis not present

## 2020-01-31 DIAGNOSIS — D62 Acute posthemorrhagic anemia: Secondary | ICD-10-CM | POA: Diagnosis not present

## 2020-01-31 DIAGNOSIS — M47812 Spondylosis without myelopathy or radiculopathy, cervical region: Secondary | ICD-10-CM | POA: Diagnosis present

## 2020-01-31 DIAGNOSIS — Z20822 Contact with and (suspected) exposure to covid-19: Secondary | ICD-10-CM | POA: Diagnosis present

## 2020-01-31 DIAGNOSIS — R531 Weakness: Secondary | ICD-10-CM | POA: Diagnosis present

## 2020-01-31 DIAGNOSIS — M8580 Other specified disorders of bone density and structure, unspecified site: Secondary | ICD-10-CM | POA: Diagnosis present

## 2020-01-31 DIAGNOSIS — D72829 Elevated white blood cell count, unspecified: Secondary | ICD-10-CM | POA: Diagnosis not present

## 2020-01-31 DIAGNOSIS — I714 Abdominal aortic aneurysm, without rupture, unspecified: Secondary | ICD-10-CM

## 2020-01-31 DIAGNOSIS — R451 Restlessness and agitation: Secondary | ICD-10-CM | POA: Diagnosis present

## 2020-01-31 DIAGNOSIS — R42 Dizziness and giddiness: Secondary | ICD-10-CM | POA: Diagnosis present

## 2020-01-31 DIAGNOSIS — Z803 Family history of malignant neoplasm of breast: Secondary | ICD-10-CM

## 2020-01-31 DIAGNOSIS — M25519 Pain in unspecified shoulder: Secondary | ICD-10-CM | POA: Diagnosis present

## 2020-01-31 DIAGNOSIS — I959 Hypotension, unspecified: Secondary | ICD-10-CM | POA: Diagnosis present

## 2020-01-31 DIAGNOSIS — Z91040 Latex allergy status: Secondary | ICD-10-CM

## 2020-01-31 DIAGNOSIS — R5381 Other malaise: Secondary | ICD-10-CM | POA: Diagnosis present

## 2020-01-31 DIAGNOSIS — M2578 Osteophyte, vertebrae: Secondary | ICD-10-CM | POA: Diagnosis present

## 2020-01-31 DIAGNOSIS — M533 Sacrococcygeal disorders, not elsewhere classified: Secondary | ICD-10-CM

## 2020-01-31 DIAGNOSIS — I607 Nontraumatic subarachnoid hemorrhage from unspecified intracranial artery: Secondary | ICD-10-CM

## 2020-01-31 DIAGNOSIS — G8929 Other chronic pain: Secondary | ICD-10-CM | POA: Diagnosis present

## 2020-01-31 DIAGNOSIS — Z79899 Other long term (current) drug therapy: Secondary | ICD-10-CM

## 2020-01-31 DIAGNOSIS — I77819 Aortic ectasia, unspecified site: Secondary | ICD-10-CM

## 2020-01-31 DIAGNOSIS — J449 Chronic obstructive pulmonary disease, unspecified: Secondary | ICD-10-CM | POA: Diagnosis present

## 2020-01-31 DIAGNOSIS — M545 Low back pain: Secondary | ICD-10-CM | POA: Diagnosis not present

## 2020-01-31 DIAGNOSIS — E876 Hypokalemia: Secondary | ICD-10-CM | POA: Diagnosis present

## 2020-01-31 DIAGNOSIS — F17210 Nicotine dependence, cigarettes, uncomplicated: Secondary | ICD-10-CM | POA: Diagnosis present

## 2020-01-31 MED ORDER — CHLORHEXIDINE GLUCONATE CLOTH 2 % EX PADS
6.0000 | MEDICATED_PAD | Freq: Every day | CUTANEOUS | Status: DC
  Administered 2020-02-04 – 2020-02-07 (×3): 6 via TOPICAL

## 2020-01-31 MED ORDER — LORATADINE 10 MG PO TABS
10.0000 mg | ORAL_TABLET | Freq: Every day | ORAL | Status: DC
  Administered 2020-01-31 – 2020-02-09 (×9): 10 mg via ORAL
  Filled 2020-01-31 (×12): qty 1

## 2020-01-31 MED ORDER — POLYETHYLENE GLYCOL 3350 17 G PO PACK
17.0000 g | PACK | Freq: Every day | ORAL | Status: DC | PRN
  Administered 2020-02-03: 17 g via ORAL
  Filled 2020-01-31 (×2): qty 1

## 2020-01-31 MED ORDER — DOCUSATE SODIUM 100 MG PO CAPS
100.0000 mg | ORAL_CAPSULE | Freq: Two times a day (BID) | ORAL | Status: DC
  Administered 2020-01-31 – 2020-02-04 (×8): 100 mg via ORAL
  Filled 2020-01-31 (×8): qty 1

## 2020-01-31 MED ORDER — METHOCARBAMOL 500 MG PO TABS
500.0000 mg | ORAL_TABLET | Freq: Four times a day (QID) | ORAL | Status: DC | PRN
  Administered 2020-02-01 – 2020-02-06 (×6): 500 mg via ORAL
  Filled 2020-01-31 (×6): qty 1

## 2020-01-31 MED ORDER — FLEET ENEMA 7-19 GM/118ML RE ENEM: 1.0000 | ENEMA | Freq: Once | RECTAL | Status: DC | PRN

## 2020-01-31 MED ORDER — METHYLPREDNISOLONE 4 MG PO TBPK: 4.0000 mg | ORAL_TABLET | ORAL | Status: DC

## 2020-01-31 MED ORDER — PROCHLORPERAZINE EDISYLATE 10 MG/2ML IJ SOLN: 5.0000 mg | Freq: Four times a day (QID) | INTRAMUSCULAR | Status: DC | PRN

## 2020-01-31 MED ORDER — BISACODYL 10 MG RE SUPP: 10.0000 mg | Freq: Every day | RECTAL | Status: DC | PRN

## 2020-01-31 MED ORDER — METHYLPREDNISOLONE 4 MG PO TBPK: 4.0000 mg | ORAL_TABLET | Freq: Three times a day (TID) | ORAL | Status: DC

## 2020-01-31 MED ORDER — FLUTICASONE FUROATE-VILANTEROL 100-25 MCG/INH IN AEPB
1.0000 | INHALATION_SPRAY | Freq: Every day | RESPIRATORY_TRACT | Status: DC
  Administered 2020-02-01 – 2020-02-12 (×8): 1 via RESPIRATORY_TRACT
  Filled 2020-01-31 (×3): qty 28

## 2020-01-31 MED ORDER — PANTOPRAZOLE SODIUM 40 MG PO TBEC
40.0000 mg | DELAYED_RELEASE_TABLET | Freq: Every day | ORAL | Status: DC
  Administered 2020-02-01 – 2020-02-12 (×12): 40 mg via ORAL
  Filled 2020-01-31 (×12): qty 1

## 2020-01-31 MED ORDER — METHYLPREDNISOLONE 4 MG PO TBPK: 4.0000 mg | ORAL_TABLET | Freq: Four times a day (QID) | ORAL | Status: DC

## 2020-01-31 MED ORDER — GUAIFENESIN-DM 100-10 MG/5ML PO SYRP: 5.0000 mL | ORAL_SOLUTION | Freq: Four times a day (QID) | ORAL | Status: DC | PRN

## 2020-01-31 MED ORDER — METHYLPREDNISOLONE 4 MG PO TBPK
8.0000 mg | ORAL_TABLET | Freq: Every evening | ORAL | Status: AC
  Administered 2020-01-31: 8 mg via ORAL

## 2020-01-31 MED ORDER — TRAZODONE HCL 50 MG PO TABS: 25.0000 mg | ORAL_TABLET | Freq: Every evening | ORAL | Status: DC | PRN

## 2020-01-31 MED ORDER — ONDANSETRON 4 MG PO TBDP
4.0000 mg | ORAL_TABLET | Freq: Four times a day (QID) | ORAL | Status: DC | PRN
  Administered 2020-02-03: 4 mg via ORAL
  Filled 2020-01-31: qty 1

## 2020-01-31 MED ORDER — TOPIRAMATE 25 MG PO TABS
25.0000 mg | ORAL_TABLET | Freq: Every day | ORAL | Status: DC
  Administered 2020-01-31 – 2020-02-11 (×11): 25 mg via ORAL
  Filled 2020-01-31 (×12): qty 1

## 2020-01-31 MED ORDER — PROCHLORPERAZINE MALEATE 5 MG PO TABS: 5.0000 mg | ORAL_TABLET | Freq: Four times a day (QID) | ORAL | Status: DC | PRN

## 2020-01-31 MED ORDER — ACETAMINOPHEN 325 MG PO TABS: 325.0000 mg | ORAL_TABLET | ORAL | Status: DC | PRN

## 2020-01-31 MED ORDER — PROCHLORPERAZINE 25 MG RE SUPP: 12.5000 mg | Freq: Four times a day (QID) | RECTAL | Status: DC | PRN

## 2020-01-31 MED ORDER — DIPHENHYDRAMINE HCL 12.5 MG/5ML PO ELIX: 12.5000 mg | ORAL_SOLUTION | Freq: Four times a day (QID) | ORAL | Status: DC | PRN

## 2020-01-31 MED ORDER — NIMODIPINE 6 MG/ML PO SOLN
60.0000 mg | ORAL | Status: DC
  Filled 2020-01-31: qty 10

## 2020-01-31 MED ORDER — METHYLPREDNISOLONE 4 MG PO TBPK
4.0000 mg | ORAL_TABLET | ORAL | Status: AC
  Administered 2020-01-31: 4 mg via ORAL

## 2020-01-31 MED ORDER — OXYCODONE HCL 5 MG PO TABS
5.0000 mg | ORAL_TABLET | ORAL | Status: DC | PRN
  Administered 2020-01-31: 20:00:00 5 mg via ORAL
  Administered 2020-02-01 (×2): 10 mg via ORAL
  Administered 2020-02-01 (×2): 5 mg via ORAL
  Administered 2020-02-01 – 2020-02-03 (×5): 10 mg via ORAL
  Filled 2020-01-31: qty 2
  Filled 2020-01-31: qty 1
  Filled 2020-01-31 (×2): qty 2
  Filled 2020-01-31: qty 1
  Filled 2020-01-31 (×6): qty 2

## 2020-01-31 MED ORDER — BUTALBITAL-APAP-CAFFEINE 50-325-40 MG PO TABS
1.0000 | ORAL_TABLET | ORAL | Status: DC | PRN
  Administered 2020-01-31 – 2020-02-04 (×5): 1 via ORAL
  Administered 2020-02-06: 2 via ORAL
  Administered 2020-02-09 – 2020-02-10 (×2): 1 via ORAL
  Filled 2020-01-31 (×4): qty 1
  Filled 2020-01-31: qty 2
  Filled 2020-01-31 (×3): qty 1

## 2020-01-31 MED ORDER — FLUTICASONE PROPIONATE 50 MCG/ACT NA SUSP
2.0000 | Freq: Every day | NASAL | Status: DC
  Administered 2020-02-01 – 2020-02-03 (×3): 2 via NASAL

## 2020-01-31 MED ORDER — ALUM & MAG HYDROXIDE-SIMETH 200-200-20 MG/5ML PO SUSP: 30.0000 mL | ORAL | Status: DC | PRN

## 2020-01-31 MED ORDER — VITAMIN D 25 MCG (1000 UNIT) PO TABS
2000.0000 [IU] | ORAL_TABLET | Freq: Every day | ORAL | Status: DC
  Administered 2020-01-31 – 2020-02-11 (×11): 2000 [IU] via ORAL
  Filled 2020-01-31 (×12): qty 2

## 2020-01-31 MED ORDER — ONDANSETRON HCL 4 MG/2ML IJ SOLN: 4.0000 mg | Freq: Four times a day (QID) | INTRAMUSCULAR | Status: DC | PRN

## 2020-01-31 MED ORDER — METHYLPREDNISOLONE 4 MG PO TBPK: 8.0000 mg | ORAL_TABLET | Freq: Every evening | ORAL | Status: DC

## 2020-01-31 MED ORDER — NIMODIPINE 30 MG PO CAPS
60.0000 mg | ORAL_CAPSULE | ORAL | Status: DC
  Administered 2020-01-31 – 2020-02-12 (×62): 60 mg via ORAL
  Filled 2020-01-31 (×72): qty 2

## 2020-01-31 MED ORDER — ALBUTEROL SULFATE (2.5 MG/3ML) 0.083% IN NEBU: 2.5000 mg | INHALATION_SOLUTION | Freq: Four times a day (QID) | RESPIRATORY_TRACT | Status: DC | PRN

## 2020-01-31 NOTE — Progress Notes (Signed)
Horton Chin, MD  Physician  Physical Medicine and Rehabilitation  Consult Note      Signed  Date of Service:  01/26/2020  5:32 AM      Related encounter: ED to Hosp-Admission (Discharged) from 01/23/2020 in [facility] NEURO/TRAUMA/SURGICAL ICU      Signed      Expand AllCollapse All   Show:Clear all [x] Manual[x] Template[] Copied  Added by: [x] Angiulli, , PA-C[x] Raulkar, , MD  [] Hover for details          Physical Medicine and Rehabilitation Consult Reason for Consult: Altered mental status Referring Physician: Dr. Mcarthur Rossetti     HPI: Autumn Le is a 74 y.o. right-handed female with history of vertigo as well as tobacco abuse.  Per chart review patient lives alone independent prior to admission.  1 level apartment with elevator.  Presented 01/23/2020 to [facility] hospital with altered mental status as well as mild headache.  Patient could not recall full incident.  Admission chemistries unremarkable, troponin high-sensitivity 474, CK 458.  Cranial CT scan showed extensive subarachnoid hemorrhage about the right cerebral hemisphere and basal cisterns.  No significant mass-effect or midline shift.  Parenchymal hypodensity of the right temporal lobe of uncertain etiology traumatic versus infarction.  No fracture or static subluxation of the cervical spine.  CT angiogram of the chest without pulmonary emboli.  CT angiogram of head and neck approximately 9 mm aneurysm of the communicating segment of the right ICA.  Additional approximately 4 mm aneurysm of the right A2 ACA.  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 03/24/2020.  Nimotop initiated for blood pressure control.  Tolerating a regular diet.  Therapy evaluations completed with recommendations of physical medicine rehab consult.     Review of Systems  Constitutional: Negative for chills and fever.  HENT:  Negative for hearing loss.   Eyes: Negative for blurred vision and double vision.  Respiratory: Negative for cough and shortness of breath.   Cardiovascular: Negative for chest pain, palpitations and leg swelling.  Gastrointestinal: Positive for constipation. Negative for heartburn, nausea and vomiting.  Genitourinary: Negative for dysuria, flank pain and hematuria.  Musculoskeletal: Positive for joint pain and myalgias.  Skin: Negative for rash.  Neurological: Positive for dizziness and headaches.  All other systems reviewed and are negative.       Past Medical History:  Diagnosis Date  . Vertigo           Past Surgical History:  Procedure Laterality Date  . TONSILLECTOMY             Family History  Problem Relation Age of Onset  . Breast cancer Maternal Aunt      Social History:  reports that she has been smoking. She has never used smokeless tobacco. She reports previous alcohol use. She reports previous drug use. Allergies:      Allergies  Allergen Reactions  . Latex Hives          Medications Prior to Admission  Medication Sig Dispense Refill  . acetaminophen (TYLENOL) 325 MG tablet Take 650 mg by mouth every 6 (six) hours as needed for headache (pain).      [facility] albuterol (VENTOLIN HFA) 108 (90 Base) MCG/ACT inhaler Inhale 2 puffs into the lungs every 6 (six) hours as needed for wheezing or shortness of breath.       . Calcium Carbonate (CALCIUM 600 PO) Take 600 mg by mouth at bedtime.      03/24/2020  Cholecalciferol 50 MCG (2000 UT) CAPS Take 2,000 Units by mouth at bedtime.       . fluticasone (FLONASE) 50 MCG/ACT nasal spray Place 2 sprays into both nostrils daily.       . fluticasone furoate-vilanterol (BREO ELLIPTA) 100-25 MCG/INH AEPB Inhale 1 puff into the lungs daily.      [facility] Kitchen loratadine (CLARITIN) 10 MG tablet Take 10 mg by mouth at bedtime.           Home: Home Living Family/patient expects to be discharged to:: Private residence Living Arrangements: Alone Available  Help at Discharge: Family, Available PRN/intermittently Type of Home: Apartment Home Access: Elevator Home Layout: One level Bathroom Shower/Tub: Chiropodist: Standard Home Equipment: Tub bench Additional Comments: [address] in [place] for seniors; on 5th floor  Functional History: Prior Function Level of Independence: Independent Comments: Driving, medications, cooking, cleaning; retired Functional Status:  Mobility: Cook bed mobility: Needs Assistance Bed Mobility: Supine to Sit Supine to sit: Supervision Transfers Overall transfer level: Needs assistance Equipment used: None Transfers: Sit to/from Stand Sit to Stand: Min guard General transfer comment: for safety Ambulation/Gait Ambulation/Gait assistance: Min assist, +2 physical assistance Gait Distance (Feet): 100 Feet Assistive device: None Gait Pattern/deviations: Step-through pattern, Decreased stride length, Scissoring General Gait Details: Pt requiring minA + 2 for stability, demonstrates decreased pelvic rotation, arm swing, occasional scissoring.  Gait velocity: decreased   ADL: ADL Overall ADL's : Needs assistance/impaired Eating/Feeding: Set up, Sitting Grooming: Min guard, Standing Grooming Details (indicate cue type and reason): Pt forgot that comb was available to comb hair- pt started combing hair with digits. Upper Body Bathing: Set up, Sitting Lower Body Bathing: Min guard, Sitting/lateral leans, Sit to/from stand Upper Body Dressing : Min guard, Sitting Lower Body Dressing: Minimal assistance, Cueing for safety, Sitting/lateral leans, Sit to/from stand Toilet Transfer: Minimal assistance, Cueing for safety, Ambulation Toileting- Clothing Manipulation and Hygiene: Minimal assistance, Sitting/lateral lean, Sit to/from stand, Cueing for safety, Cueing for sequencing Functional mobility during ADLs: Minimal assistance, +2 for physical assistance, Cueing for  safety, Cueing for sequencing(with hand held assist) General ADL Comments: Pt with decreased safety awareness and short term memory cognitive deficits, decreased strength and decreased ability to care for self.   Cognition: Cognition Overall Cognitive Status: Impaired/Different from baseline Orientation Level: Oriented to person, Oriented to place, Oriented to time, Disoriented to situation Cognition Arousal/Alertness: Awake/alert Behavior During Therapy: WFL for tasks assessed/performed Overall Cognitive Status: Impaired/Different from baseline Area of Impairment: Orientation, Memory, Awareness Orientation Level: Disoriented to, Place, Situation Memory: Decreased short-term memory Awareness: Intellectual General Comments: Pt with decreased orientation and inability to recongize deficits. Pt unable to recall 3 items after 3 mins and after 10 mins; immediate recall, WFLs.   Blood pressure 136/66, pulse 65, temperature 99.1 F (37.3 C), temperature source Oral, resp. rate 19, height 5\' 4"  (1.626 m), weight 55.3 kg, SpO2 95 %.   Physical Exam  General: No apparent distress; fatigued and somnolent.  HEENT: Head is normocephalic, atraumatic, PERRLA, EOMI, sclera anicteric, oral mucosa pink and moist, dentition intact, ext ear canals clear,  Neck: Supple without JVD or lymphadenopathy Heart: Reg rate and rhythm. No murmurs rubs or gallops Chest: CTA bilaterally without wheezes, rales, or rhonchi; no distress Abdomen: Soft, non-tender, non-distended, bowel sounds positive. Extremities: No clubbing, cyanosis, or edema. Pulses are 2+ Skin: Clean and intact without signs of breakdown Neuro: Patient is alert in no acute distress.  She does make eye contact with examiner and  follows simple commands.  Provides her name and age but very limited medical historian cannot recall incident leading up to her hospitalization.  She appears to show some decreased awareness of her deficits. Also forgetful of  the fact that she worked with therapy here. 4/5 strength throughout extremities due to diffuse weakness; no focal deficits.  Musculoskeletal: Full ROM, No pain with AROM or PROM in the neck, trunk, or extremities. Posture appropriate Psych: Pt's affect is appropriate. Pt is cooperative   Lab Results Last 24 Hours  No results found for this or any previous visit (from the past 24 hour(s)).    Imaging Results (Last 48 hours)  VAS [place] TRANSCRANIAL DOPPLER   Result Date: 01/24/2020  Transcranial Doppler Indications: Subarachnoid hemorrhage. Limitations for diagnostic windows: Unable to insonate right transtemporal window. Comparison Study: no prior Performing Technologist: Blanch Media RVS  Examination Guidelines: A complete evaluation includes B-mode imaging, spectral Doppler, color Doppler, and power Doppler as needed of all accessible portions of each vessel. Bilateral testing is considered an integral part of a complete examination. Limited examinations for reoccurring indications may be performed as noted.  +----------+-------------+----------+-----------+-------+ RIGHT TCD Right VM (cm)Depth (cm)PulsatilityComment +----------+-------------+----------+-----------+-------+ Opthalmic     20.00                 1.89            +----------+-------------+----------+-----------+-------+ ICA siphon    75.00                 1.62            +----------+-------------+----------+-----------+-------+  +----------+------------+----------+-----------+-------+ LEFT TCD  Left VM (cm)Depth (cm)PulsatilityComment +----------+------------+----------+-----------+-------+ MCA          17.00                 1.68            +----------+------------+----------+-----------+-------+ Term ICA     41.00                 1.39            +----------+------------+----------+-----------+-------+ PCA          30.00                 1.39             +----------+------------+----------+-----------+-------+ Opthalmic    22.00                 1.91            +----------+------------+----------+-----------+-------+ ICA siphon   51.00                 1.34            +----------+------------+----------+-----------+-------+ Vertebral    -17.00                1.35            +----------+------------+----------+-----------+-------+  +------------+-------+-------+             VM cm/sComment +------------+-------+-------+ Prox Basilar 21.00         +------------+-------+-------+    Preliminary          Assessment/Plan: Diagnosis: SAH 1. Does the need for close, 24 hr/day medical supervision in concert with the patient's rehab needs make it unreasonable for this patient to be served in a less intensive setting? Yes 2. Co-Morbidities requiring supervision/potential complications: nausea, dizziness, headache, lightheadedness, emesis, vertigo, tobacco use, COPD 3. Due to bladder management, bowel management, safety, skin/wound  care, disease management, medication administration, pain management and patient education, does the patient require 24 hr/day rehab nursing? Yes 4. Does the patient require coordinated care of a physician, rehab nurse, therapy disciplines of PT, OT, SLP to address physical and functional deficits in the context of the above medical diagnosis(es)? Yes Addressing deficits in the following areas: balance, endurance, locomotion, strength, transferring, bowel/bladder control, bathing, dressing, feeding, grooming, toileting, cognition, swallowing and psychosocial support 5. Can the patient actively participate in an intensive therapy program of at least 3 hrs of therapy per day at least 5 days per week? Yes 6. The potential for patient to make measurable gains while on inpatient rehab is excellent 7. Anticipated functional outcomes upon discharge from inpatient rehab are modified independent  with PT, modified  independent with OT, modified independent with SLP. 8. Estimated rehab length of stay to reach the above functional goals is: 10-14 days 9. Anticipated discharge destination: Home 10. Overall Rehab/Functional Prognosis: excellent   RECOMMENDATIONS: This patient's condition is appropriate for continued rehabilitative care in the following setting: CIR Patient has agreed to participate in recommended program. Yes Note that insurance prior authorization may be required for reimbursement for recommended care.   Comment: Autumn Le would be an excellent CIR candidate once medially stable and no longer requiring a nicardipine drip. She lives alone and has modI goals. She would be able to stay with her sister for some time if needed.  For nausea and dizziness, can consider scopolamine patch. For headache, can consider Topiramate 25mg  daily Thank you for this consult. We will continue to follow in Autumn Le's care.      Angiulli, PA-C 01/26/2020        Revision History                     Routing History

## 2020-01-31 NOTE — Plan of Care (Signed)
  Problem: Consults Goal: RH GENERAL PATIENT EDUCATION Description: See Patient Education module for education specifics. Outcome: Progressing   Problem: RH BLADDER ELIMINATION Goal: RH STG MANAGE BLADDER WITH ASSISTANCE Description: STG Manage Bladder With minimal Assistance Outcome: Progressing   Problem: RH SKIN INTEGRITY Goal: RH STG SKIN FREE OF INFECTION/BREAKDOWN Outcome: Progressing Goal: RH STG MAINTAIN SKIN INTEGRITY WITH ASSISTANCE Description: STG Maintain Skin Integrity With Assistance. Outcome: Progressing

## 2020-01-31 NOTE — Progress Notes (Signed)
Patient transported to rehab unit via wheelchair by previous RN. Patient able to stand pivot from wheelchair to bed.

## 2020-01-31 NOTE — Progress Notes (Signed)
Patient ID: Autumn Le, female   DOB: Jan 15, 1946, 74 y.o.   MRN: [id number] Stable for transfer

## 2020-01-31 NOTE — Progress Notes (Signed)
Raechel Ache, [place]  Rehab Admission Coordinator  Physical Medicine and Rehabilitation  PMR Pre-admission      Signed  Date of Service:  01/26/2020  1:32 PM      Related encounter: ED to Hosp-Admission (Discharged) from 01/23/2020 in [facility] NEURO/TRAUMA/SURGICAL ICU      Signed        PMR Admission Coordinator Pre-Admission Assessment   Patient: Autumn Le is an 74 y.o., female MRN: [id number] DOB: August 31, 1946 Height: '5\' 4"'$  (162.6 cm) Weight: 55.3 kg                                                                                                                                                  Insurance Information HMO: yes    PPO:      PCP:      IPA:      80/20:      OTHER:  PRIMARY: [employer]      Policy#: [id number]      Subscriber: Patient CM Name: Shirlean Mylar       Phone#: [phone]     Fax#: [phone] Pre-Cert#: [id number]      Employer:  [employer]m Kaufmann provided by Shirlean Mylar for admit to CIR. Auth is good for 5 days. Initial start date 3/16 with updates due weekly to Kierra (p): [phone] (f): [phone].  Benefits:  Phone #: online     Name: [email or web address], transaction ID # [id number] Eff. Date: 11/14/19-11/12/20     Deduct: does not have for in-network providers      Out of Pocket Max: $3,900($0 met)      Life Max: NA CIR: $295/day co-pay for days 1-6, $0/day co-pay for days 7-90.       SNF: $0/day co-pay for days 1-20, $184/day co-pay for days 21-100; limited to 100 days/cal yr Outpatient: $10-$40/visit co-pay pending service; visits limited by medical necessity    Home Health: 100% coverage, 0% co-insurance; limited by medical necessity DME: 80% coverage, 20% co-insurance  Providers:  SECONDARY: None      Policy#:       Subscriber:  CM Name:       Phone#:      Fax#:  Pre-Cert#:       Employer:  Benefits:  Phone #:      Name:  Eff. Date:     Deduct:       Out of Pocket Max:       Life Max:  CIR:       SNF:  Outpatient:      Co-Pay:  Home Health:        Co-Pay:  DME:      Co-Pay:    Medicaid Application Date:       Case Manager:  Disability Application Date:       Case Worker:    The "Data Collection  Information Summary" for patients in Inpatient Rehabilitation Facilities with attached "Privacy Act [place] Records" was provided and verbally reviewed with: Patient   Emergency Contact Information         Contact Information     Name Relation Home Work Autumn Le, [place] Sister [phone]           Current Medical History  Patient Admitting Diagnosis: SAH   History of Present Illness: Autumn Le is a 74 y.o. with history of vertigo as well as tobacco abuse.  Presented 01/23/2020 to [facility] hospital with altered mental status as well as mild headache.  Patient could not recall full incident.  Admission chemistries unremarkable, troponin high-sensitivity 474, CK 458.  Cranial CT scan showed extensive subarachnoid hemorrhage about the right cerebral hemisphere and basal cisterns.  No significant mass-effect or midline shift.  Parenchymal hypodensity of the right temporal lobe of uncertain etiology traumatic versus infarction.  No fracture or static subluxation of the cervical spine.  CT angiogram of the chest without pulmonary emboli.  CT angiogram of head and neck approximately 9 mm aneurysm of the communicating segment of the right ICA.  Additional approximately 4 mm aneurysm of the right A2 ACA.  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 Kathyrn Sheriff.  Nimotop initiated for blood pressure control.  Tolerating a regular diet.  Therapy evaluations completed with recommendations for CIR. Pt is to admit to CIR on 01/31/20.   Complete NIHSS TOTAL: 0 Glasgow Coma Scale Score: 15   Past Medical History      Past Medical History:  Diagnosis Date  . COPD (chronic obstructive pulmonary disease) ([place])    . Degenerative joint  disease of cervical spine    . Meniere's disease in remission, bilateral    . Osteopenia    . Vertigo        Family History  family history includes Breast cancer in her maternal aunt.   Prior Rehab/Hospitalizations:  Has the patient had prior rehab or hospitalizations prior to admission? No   Has the patient had major surgery during 100 days prior to admission? Yes   Current Medications    Current Facility-Administered Medications:  .   stroke: mapping our early stages of recovery book, , Does not apply, Once, Consuella Lose, MD .  0.9 %  sodium chloride infusion, , Intravenous, Continuous, Consuella Lose, MD, Last Rate: 20 mL/hr at 01/29/20 1400, Rate Verify at 01/29/20 1400 .  acetaminophen (TYLENOL) tablet 650 mg, 650 mg, Oral, Q4H PRN, 650 mg at 01/26/20 1005 **OR** acetaminophen (TYLENOL) 160 MG/5ML solution 650 mg, 650 mg, Per Tube, Q4H PRN **OR** acetaminophen (TYLENOL) suppository 650 mg, 650 mg, Rectal, Q4H PRN, Consuella Lose, MD .  albuterol (PROVENTIL) (2.5 MG/3ML) 0.083% nebulizer solution 2.5 mg, 2.5 mg, Nebulization, Q6H PRN, Consuella Lose, MD .  butalbital-acetaminophen-caffeine (FIORICET) 50-325-40 MG per tablet 1-2 tablet, 1-2 tablet, Oral, Q4H PRN, Costella, Vista Mink, PA-C, 2 tablet at 01/29/20 1617 .  calcium carbonate (OS-CAL - dosed in mg of elemental calcium) tablet 500 mg of elemental calcium, 500 mg of elemental calcium, Oral, QHS, Nundkumar, Neelesh, MD, 500 mg of elemental calcium at 01/28/20 2110 .  Chlorhexidine Gluconate Cloth 2 % PADS 6 each, 6 each, Topical, Daily, Consuella Lose, MD, 6 each at 01/28/20 1055 .  cholecalciferol (VITAMIN D3) tablet 2,000 Units, 2,000 Units, Oral, QHS, Consuella Lose, MD, 2,000 Units at 01/28/20 2107 .  docusate sodium (COLACE)  capsule 100 mg, 100 mg, Oral, BID, Consuella Lose, MD, 100 mg at 01/29/20 1142 .  fluticasone (FLONASE) 50 MCG/ACT nasal spray 2 spray, 2 spray, Each Nare, Daily, Consuella Lose, MD, 2 spray at 01/29/20 1356 .  fluticasone furoate-vilanterol (BREO ELLIPTA) 100-25 MCG/INH 1 puff, 1 puff, Inhalation, Daily, Consuella Lose, MD, 1 puff at 01/28/20 0845 .  HYDROcodone-acetaminophen (NORCO/VICODIN) 5-325 MG per tablet 1-2 tablet, 1-2 tablet, Oral, Q4H PRN, Ashok Pall, MD, 2 tablet at 01/29/20 1141 .  ibuprofen (ADVIL) tablet 400 mg, 400 mg, Oral, Q8H PRN, Consuella Lose, MD, 400 mg at 01/29/20 1353 .  loratadine (CLARITIN) tablet 10 mg, 10 mg, Oral, QHS, Consuella Lose, MD, 10 mg at 01/28/20 2107 .  methocarbamol (ROBAXIN) tablet 500 mg, 500 mg, Oral, Q8H PRN, Ashok Pall, MD, 500 mg at 01/28/20 0808 .  niMODipine (NIMOTOP) capsule 60 mg, 60 mg, Oral, Q4H, 60 mg at 01/29/20 1617 **OR** niMODipine (NYMALIZE) 6 MG/ML oral solution 60 mg, 60 mg, Per Tube, Q4H, Consuella Lose, MD .  ondansetron (ZOFRAN-ODT) disintegrating tablet 4 mg, 4 mg, Oral, Q6H PRN, 4 mg at 01/27/20 0959 **OR** ondansetron (ZOFRAN) injection 4 mg, 4 mg, Intravenous, Q6H PRN, Consuella Lose, MD, 4 mg at 01/26/20 0805 .  pantoprazole (PROTONIX) EC tablet 40 mg, 40 mg, Oral, Daily, 40 mg at 01/29/20 1141 **OR** pantoprazole sodium (PROTONIX) 40 mg/20 mL oral suspension 40 mg, 40 mg, Per Tube, Daily, Consuella Lose, MD   Patients Current Diet:     Diet Order                      Diet regular Room service appropriate? Yes; Fluid consistency: Thin  Diet effective now                   Precautions / Restrictions Precautions Precautions: Fall Precaution Comments: goal SBP<140 Restrictions Weight Bearing Restrictions: No    Has the patient had 2 or more falls or a fall with injury in the past year?No   Prior Activity Level Community (5-7x/wk): retired, but still drives, runs errands, uses no AD.    Prior Functional Level Prior Function Level of Independence: Independent Comments: Driving, medications, cooking, cleaning; retired   Self Care: Did the patient need  help bathing, dressing, using the toilet or eating?  Independent   Indoor Mobility: Did the patient need assistance with walking from room to room (with or without device)? Independent   Stairs: Did the patient need assistance with internal or external stairs (with or without device)? Independent   Functional Cognition: Did the patient need help planning regular tasks such as shopping or remembering to take medications? Independent   Home Assistive Devices / Equipment Home Equipment: Tub bench   Prior Device Use: Indicate devices/aids used by the patient prior to current illness, exacerbation or injury? None of the above   Current Functional Level Cognition   Arousal/Alertness: Awake/alert Overall Cognitive Status: Impaired/Different from baseline Current Attention Level: Sustained Orientation Level: Oriented X4 General Comments: Proper sequencing with toileting (including manipulating underwear), oral care and hand-washing at sink with no cues needed Attention: Sustained Sustained Attention: Impaired Sustained Attention Impairment: Verbal basic Memory: Impaired Memory Impairment: Storage deficit, Retrieval deficit, Decreased recall of new information Awareness: Impaired Awareness Impairment: Intellectual impairment, Anticipatory impairment, Emergent impairment Problem Solving: Impaired Problem Solving Impairment: Verbal basic Safety/Judgment: Impaired    Extremity Assessment (includes Sensation/Coordination)   Upper Extremity Assessment: Generalized weakness  Lower Extremity Assessment: Defer to PT evaluation  ADLs   Overall ADL's : Needs assistance/impaired Eating/Feeding: Set up, Sitting Grooming: Wash/dry hands, Wash/dry face, Oral care, Brushing hair, Standing, Minimal assistance Grooming Details (indicate cue type and reason): encouragement to participate in multiple tasks in standing Upper Body Bathing: Set up, Sitting Lower Body Bathing: Min guard,  Sitting/lateral leans, Sit to/from stand Upper Body Dressing : Min guard, Sitting Lower Body Dressing: Minimal assistance, Cueing for safety, Sitting/lateral leans, Sit to/from stand Toilet Transfer: Minimal assistance, +2 for safety/equipment, Ambulation Toilet Transfer Details (indicate cue type and reason): Min A for balance and safety during sit<>stand Toileting- Clothing Manipulation and Hygiene: Minimal assistance, Sitting/lateral lean, Sit to/from stand Toileting - Clothing Manipulation Details (indicate cue type and reason): assist for balance while pt managing underwear and performing pericare  Functional mobility during ADLs: Minimal assistance, +2 for safety/equipment General ADL Comments: Pt performing toileting, hand hygiene, and oral care with MIn A for balance. Pt presenting with decreased safety.      Mobility   Overal bed mobility: Needs Assistance Bed Mobility: Supine to Sit, Sit to Supine Supine to sit: Supervision Sit to supine: Supervision General bed mobility comments: for lines and safety     Transfers   Overall transfer level: Needs assistance Equipment used: None Transfers: Sit to/from Stand Sit to Stand: Min assist General transfer comment: slightly unsteady, wavering upon standing; denied dizziness "feels weak"     Ambulation / Gait / Stairs / Wheelchair Mobility   Ambulation/Gait Ambulation/Gait assistance: Herbalist (Feet): 50 Feet(x2; standing rest break) Assistive device: IV Pole Gait Pattern/deviations: Step-through pattern, Decreased stride length, Narrow base of support, Drifts right/left General Gait Details: pt more unsteady and instructed to hold IV pole for stability (PT also holding/guiding pole); standing rest break x 1 Gait velocity: decreased     Posture / Balance Dynamic Sitting Balance Sitting balance - Comments: pt leaning to far L and R when directing PA to find something in her drawer, close mingaurd for safety when  leaning outside BOS but with no LOB Balance Overall balance assessment: Needs assistance Sitting-balance support: Feet unsupported, No upper extremity supported Sitting balance-Leahy Scale: Good Sitting balance - Comments: pt leaning to far L and R when directing PA to find something in her drawer, close mingaurd for safety when leaning outside BOS but with no LOB Standing balance support: No upper extremity supported, During functional activity Standing balance-Leahy Scale: Fair Standing balance comment: pt seeking UE support for mobility tasks     Special needs/care consideration BiPAP/CPAP: no CPM: no Continuous Drip IV: 0.9% sodium chloride infusion  Dialysis: no       Days: no Life Vest: no Oxygen: 1L/min Special Bed: no Trach Size: no Wound Vac (area): no      Location: no Skin: surgical incision                 Bowel mgmt: no BM recorded this admission Bladder mgmt:continent Diabetic mgmt: no Behavioral consideration : no Chemo/radiation : no        Previous Home Environment (from acute therapy documentation) Living Arrangements: Alone  Lives With: Alone Available Help at Discharge: Family, Friend(s) Type of Home: Apartment Home Layout: One level Home Access: Elevator Bathroom Shower/Tub: Chiropodist: Standard Additional Comments: [facility] [facility] [place] [facility]; on 5th floor   Discharge Living Setting Plans for Discharge Living Setting: Other (Comment)(plan to go home with sister to her house at [place]) Type of Home at Discharge: House Discharge Home Layout: One  level Discharge Home Access: Stairs to enter Entrance Stairs-Rails: Right Entrance Stairs-Number of Steps: 3 Discharge Bathroom Shower/Tub: Tub/shower unit, Walk-in shower Discharge Bathroom Toilet: Standard Discharge Bathroom Accessibility: Yes How Accessible: Accessible via walker Does the patient have any problems obtaining your medications?: No   Social/Family/Support  Systems Patient Roles: Other (Comment)(close to sister; 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 Information: sister: Daine Floras [phone] Anticipated Caregiver: sister Anticipated Caregiver's Contact Information: see above Ability/Limitations of Caregiver: supervision Caregiver Availability: 24/7 Discharge Plan Discussed with Primary Caregiver: Yes Is Caregiver In Agreement with Plan?: Yes Does Caregiver/Family have Issues with Lodging/Transportation while Pt is in Rehab?: No     Goals/Additional Needs Patient/Family Goal for Rehab: PT/OT/SLP: Mod I Expected length of stay: 7-10 days  Cultural Considerations: NA Dietary Needs: regular diet, thin liquids Equipment Needs: TBD Special Service Needs: TBI Team Pt/Family Agrees to Admission and willing to participate: Yes Program Orientation Provided & Reviewed with Pt/Caregiver Including Roles  & Responsibilities: Yes(reviewed with pt and her sister)  Barriers to Discharge: Home environment access/layout  Barriers to Discharge Comments: steps to enter home     Decrease burden of Care through IP rehab admission: NA     Possible need for SNF placement upon discharge:Not anticipated; pt has good social support at [place] from her sister who can provide 24/7 Supervision as needed. Pt has Mod I goals and feel this can be accomplished through CIR level program in a reasonable amount of time.      Patient Condition: This patient's medical and functional status has changed since the consult dated: 01/26/20 in which the Rehabilitation Physician determined and documented that the patient's condition is appropriate for intensive rehabilitative care in an inpatient rehabilitation facility. See "History of Present Illness" (above) for medical update. Functional changes are: improvement in gait from Min A +2 100 feet to Min A 25 feet (x2). Patient's medical and functional status update has been discussed with the Rehabilitation physician and patient remains  appropriate for inpatient rehabilitation. Will admit to inpatient rehab tomorrow, 01/31/20.   Preadmission Screen Completed By:  Raechel Ache, OT, 01/29/2020 5:58 PM   ____________     Discussed status with Dr. Letta Pate on 01/30/20 at 2:18PM and received approval for admission Saturday, 01/31/20.   Admission Coordinator:  Raechel Ache, time 2:18PM/Date 01/30/20             Cosigned by: Charlett Blake, MD at 01/31/2020 12:54 PM  Revision History

## 2020-01-31 NOTE — H&P (Signed)
Physical Medicine and Rehabilitation Admission H&P        Chief Complaint  Patient presents with  . Ruptured aneurysm with functional deficits.       HPI: Autumn Le is a 74 year old female with history of COPD, vertigo; who was admitted via [facility] on 01/23/2020 with mild headache, lightheadedness, no recall of past 48 hours and some upper neck pain with blood near her bed and abrasion on her elbow. CT head showed extensive SAH about right cerebral hemisphere and basal cisterns and was negative for skull fracture.  CTA head/neck revealed 9 mm aneurysm of communicating segment of right ICA--likely culprit of hemorrhage and additional approximate 4 mm aneurysm right A2 ACA.  CTA chest negative for PE.  She was transferred to [facility] for work-up and she underwent cerebral angio with successful coil embolization of right PCA aneurysm.  Also note made of approximately 3 mm right 8 2-3 aneurysm and approximately 7 mm cavernous right ICA aneurysm.  She was placed on Nimotop supportive care and postprocedure had intermittent nausea as well as ongoing headaches. Fioricet and hydrocodone add for prn use but she continues to be limited by significant neck pain and HA.  Methylprednisolone dose pack added today to help ameliorate symptoms. TCD without evidence of vasospasms and patien to complete 21 total days of nimotop. Therapy ongoing and patient limited by pain, cognitive deficits affecting safety/awareness, balance deficits as well as  dizziness and nausea. CIR recommended due to functional decline.       Review of Systems  Constitutional: Negative for chills and fever.  HENT: Negative for hearing loss and tinnitus.   Eyes: Negative for blurred vision and double vision.  Respiratory: Negative for cough and shortness of breath.   Cardiovascular: Negative for chest pain, palpitations and leg swelling.  Gastrointestinal: Negative for constipation, heartburn and nausea.  Genitourinary: Negative  for dysuria and urgency.  Musculoskeletal: Positive for neck pain (shoulder pain). Negative for myalgias.  Skin: Negative for rash.  Neurological: Positive for dizziness and headaches ("a pinch better"). Negative for focal weakness.  Psychiatric/Behavioral: Negative for memory loss. The patient is not nervous/anxious.           Past Medical History:  Diagnosis Date  . COPD (chronic obstructive pulmonary disease) ([place])    . Degenerative joint disease of cervical spine    . Meniere's disease in remission, bilateral    . Osteopenia    . Vertigo             Past Surgical History:  Procedure Laterality Date  . RADIOLOGY WITH ANESTHESIA N/A 01/23/2020    Procedure: RADIOLOGY WITH ANESTHESIA;  Surgeon: Consuella Lose, MD;  Location: [facility];  Service: Radiology;  Laterality: N/A;  . TONSILLECTOMY               Family History  Problem Relation Age of Onset  . Breast cancer Maternal Aunt        Social History:  Lives alone in independent living [place]. Retired Banker for [employer]. Moved from [place] a year ago to helpe her sister (whose husband has dementia)  She independent PTA. She reports that she has been smoking 1 PPD for 40+ years.  She has never used smokeless tobacco. She drinks a glass of wine occasionally.  She reports previous drug use.          Allergies  Allergen Reactions  . Latex Hives  Medications Prior to Admission  Medication Sig Dispense Refill  . acetaminophen (TYLENOL) 325 MG tablet Take 650 mg by mouth every 6 (six) hours as needed for headache (pain).      Marland Kitchen. albuterol (VENTOLIN HFA) 108 (90 Base) MCG/ACT inhaler Inhale 2 puffs into the lungs every 6 (six) hours as needed for wheezing or shortness of breath.       . Calcium Carbonate (CALCIUM 600 PO) Take 600 mg by mouth at bedtime.      . Cholecalciferol 50 MCG (2000 UT) CAPS Take 2,000 Units by mouth at bedtime.       . fluticasone (FLONASE) 50 MCG/ACT nasal spray Place 2 sprays  into both nostrils daily.       . fluticasone furoate-vilanterol (BREO ELLIPTA) 100-25 MCG/INH AEPB Inhale 1 puff into the lungs daily.      Marland Kitchen. loratadine (CLARITIN) 10 MG tablet Take 10 mg by mouth at bedtime.           Drug Regimen Review  Drug regimen was reviewed and remains appropriate with no significant issues identified   Home: Home Living Family/patient expects to be discharged to:: Private residence Living Arrangements: Alone Available Help at Discharge: Family, Friend(s) Type of Home: Apartment Home Access: Elevator Home Layout: One level Bathroom Shower/Tub: Engineer, manufacturing systemsTub/shower unit Bathroom Toilet: Standard Home Equipment: Tub bench Additional Comments: [employer] [place]; on 5th floor  Lives With: Alone   Functional History: Prior Function Level of Independence: Independent Comments: Driving, medications, cooking, cleaning; retired   Functional Status:  Mobility: Bed Mobility Overal bed mobility: Needs Assistance Bed Mobility: Supine to Sit, Sit to Supine Supine to sit: Supervision Sit to supine: Supervision General bed mobility comments: for lines and safety Transfers Overall transfer level: Needs assistance Equipment used: None Transfers: Sit to/from Stand Sit to Stand: Min assist General transfer comment: slightly unsteady, wavering upon standing; denied dizziness "feels weak" Ambulation/Gait Ambulation/Gait assistance: Min assist Gait Distance (Feet): 50 Feet(x2; standing rest break) Assistive device: IV Pole Gait Pattern/deviations: Step-through pattern, Decreased stride length, Narrow base of support, Drifts right/left General Gait Details: pt more unsteady and instructed to hold IV pole for stability (PT also holding/guiding pole); standing rest break x 1 Gait velocity: decreased   ADL: ADL Overall ADL's : Needs assistance/impaired Eating/Feeding: Set up, Sitting Grooming: Wash/dry hands, Wash/dry face, Oral care, Brushing hair,  Standing, Minimal assistance Grooming Details (indicate cue type and reason): encouragement to participate in multiple tasks in standing Upper Body Bathing: Set up, Sitting Lower Body Bathing: Min guard, Sitting/lateral leans, Sit to/from stand Upper Body Dressing : Min guard, Sitting Lower Body Dressing: Minimal assistance, Cueing for safety, Sitting/lateral leans, Sit to/from stand Toilet Transfer: Minimal assistance, +2 for safety/equipment, Ambulation Toilet Transfer Details (indicate cue type and reason): Min A for balance and safety during sit<>stand Toileting- Clothing Manipulation and Hygiene: Minimal assistance, Sitting/lateral lean, Sit to/from stand Toileting - Clothing Manipulation Details (indicate cue type and reason): assist for balance while pt managing underwear and performing pericare  Functional mobility during ADLs: Minimal assistance, +2 for safety/equipment General ADL Comments: Pt performing toileting, hand hygiene, and oral care with MIn A for balance. Pt presenting with decreased safety.    Cognition: Cognition Overall Cognitive Status: Impaired/Different from baseline Arousal/Alertness: Awake/alert Orientation Level: Oriented X4 Attention: Sustained Sustained Attention: Impaired Sustained Attention Impairment: Verbal basic Memory: Impaired Memory Impairment: Storage deficit, Retrieval deficit, Decreased recall of new information Awareness: Impaired Awareness Impairment: Intellectual impairment, Anticipatory impairment, Emergent impairment Problem Solving:  Impaired Problem Solving Impairment: Verbal basic Safety/Judgment: Impaired Cognition Arousal/Alertness: Awake/alert Behavior During Therapy: Flat affect Overall Cognitive Status: Impaired/Different from baseline Area of Impairment: Awareness, Problem solving, Attention Orientation Level: (not tested) Current Attention Level: Sustained Memory: Decreased short-term memory Awareness: Emergent Problem  Solving: Slow processing, Requires verbal cues, Requires tactile cues, Decreased initiation General Comments: Proper sequencing with toileting (including manipulating underwear), oral care and hand-washing at sink with no cues needed   Physical Exam: Blood pressure (!) 145/66, pulse (!) 59, temperature 98.3 F (36.8 C), temperature source Oral, resp. rate 18, height 5\' 4"  (1.626 m), weight 55.3 kg, SpO2 94 %. Physical Exam  Nursing note and vitals reviewed. Constitutional: She appears well-developed and well-nourished.  Lying flat in bed. She reports that neck/shoulder/head feels better that way. . Alert and appropriate.   GI: She exhibits no distension. There is no abdominal tenderness.  Neurological: She is alert.  Skin: Skin is warm and dry.    General: No acute distress Mood and affect are appropriate Heart: Regular rate and rhythm no rubs murmurs or extra sounds Lungs: Clear to auscultation, breathing unlabored, no rales or wheezes Abdomen: Positive bowel sounds, soft nontender to palpation, nondistended Extremities: No clubbing, cyanosis, or edema Skin: No evidence of breakdown, no evidence of rash Neurologic:slow processing speed, increase latency of response oriented to person and place but not date  Cranial nerves II through XII intact, motor strength is5 /5 in bilateral deltoid, bicep, tricep, grip, hip flexor, knee extensors, ankle dorsiflexor and plantar flexor Sensory exam normal sensation to light touch and proprioception in bilateral upper and lower extremities Cerebellar exam mild dysmetria Left  finger to nose to finger ,normal  heel to shin in bilateral  lower extremities Musculoskeletal: Full range of motion in all 4 extremities. No joint swelling    Lab Results Last 48 Hours  No results found for this or any previous visit (from the past 48 hour(s)).    Imaging Results (Last 48 hours)  VAS TRANSCRANIAL DOPPLER   Result Date: 01/28/2020  Transcranial Doppler  Indications: Subarachnoid hemorrhage. Limitations: Poor acoustic windows. Patient sitting in chair. Comparison Study: 01/26/2020 Performing Technologist: 01/28/2020 MHA, RDMS, RVT, RDCS  Examination Guidelines: A complete evaluation includes B-mode imaging, spectral Doppler, color Doppler, and power Doppler as needed of all accessible portions of each vessel. Bilateral testing is considered an integral part of a complete examination. Limited examinations for reoccurring indications may be performed as noted.  +----------+-------------+----------+-----------+------------------+ RIGHT TCD Right VM (cm)Depth (cm)Pulsatility     Comment       +----------+-------------+----------+-----------+------------------+ MCA                                         Unable to insonate +----------+-------------+----------+-----------+------------------+ ACA                                         Unable to insonate +----------+-------------+----------+-----------+------------------+ Term ICA                                    Unable to insonate +----------+-------------+----------+-----------+------------------+ PCA           26.00  1.75                       +----------+-------------+----------+-----------+------------------+ Opthalmic     30.00                 1.99                       +----------+-------------+----------+-----------+------------------+ ICA siphon                                  Unable to insonate +----------+-------------+----------+-----------+------------------+ Vertebral                                   Unable to insonate +----------+-------------+----------+-----------+------------------+ Distal ICA    33.00                 1.62                       +----------+-------------+----------+-----------+------------------+  +----------+------------+----------+-----------+------------------+ LEFT TCD  Left VM (cm)Depth  (cm)Pulsatility     Comment       +----------+------------+----------+-----------+------------------+ MCA          54.00                 1.62                       +----------+------------+----------+-----------+------------------+ ACA                                        Unable to insonate +----------+------------+----------+-----------+------------------+ Term ICA     34.00                 1.66                       +----------+------------+----------+-----------+------------------+ PCA                                        Unable to insonate +----------+------------+----------+-----------+------------------+ Opthalmic    19.00                 2.06                       +----------+------------+----------+-----------+------------------+ ICA siphon                                 Unable to insonate +----------+------------+----------+-----------+------------------+ Vertebral                                  Unable to insonate +----------+------------+----------+-----------+------------------+ Distal ICA   29.00                 1.05                       +----------+------------+----------+-----------+------------------+  +---------------------+----+ Left [place] Ratio1.86 +---------------------+----+  Summary:  limited intracranial vessels were able to be insonated. Among these vessels, MVFs and flow directions were within normal limits. no evidence of vasospasm,  stenosis or occlusion. However, global elevated pulsatility indicating increased intracranial pressure. clincal correlation recommended. *See table(s) above for TCD measurements and observations.  Diagnosing physician: Marvel Plan MD Electronically signed by Marvel Plan MD on 01/28/2020 at 7:24:16 PM.    Final              Medical Problem List and Plan: 1.  Decline in cognition and mobility secondary to Right SAH             -patient may  shower             -ELOS/Goals: 7-10d 2.   Antithrombotics: -DVT/anticoagulation:  Mechanical: Sequential compression devices, below knee Bilateral lower extremities             -antiplatelet therapy: N/a 3. Headaches/Pain Management: Will add Topamax and titrate slowly.  Will add K pad for shoulder pain and continue robaxin prn for shoulder pain. D/C ibuprofen. Will change hydrocodone to oxycodone and monitor.  4. Mood: LCSW to follow for evaluation and support.              -antipsychotic agents: N/A 5. Neuropsych: This patient is capable of making decisions on her own behalf. 6. Skin/Wound Care: Routine pressure relief measures.  7. Fluids/Electrolytes/Nutrition: Monitor I/O. Check labs in am.  8. H/o Meniere's disease: In remission.  9. COPD: Continue Breo. Will resume claritin.  10.  Nausea/dizziness: Vestibular evaluation/tx to help manage symptoms.  Will also order orthostatic BP. May need to add scopolamine patch.   11. HTN: Monitor BP tid--goal <140.  On Nimotop started on 3/14-->end date 4/4 12. Leucocytosis: Monitor for signs of infection and trend out--may rise with addition of steroids.       Jacquelynn Cree, PA-C 01/30/2020 "I have personally performed a face to face diagnostic evaluation of this patient.  Additionally, I have reviewed and concur with the physician assistant's documentation above."  Erick Colace M.D. [facility] FAAPM&R (Neuromuscular Med) Diplomate Am Board of Electrodiagnostic Med Fellow Am Board of Interventional Pain

## 2020-02-01 ENCOUNTER — Inpatient Hospital Stay (HOSPITAL_COMMUNITY): Payer: Medicare HMO | Admitting: Speech Pathology

## 2020-02-01 ENCOUNTER — Inpatient Hospital Stay (HOSPITAL_COMMUNITY): Payer: Medicare HMO | Admitting: Occupational Therapy

## 2020-02-01 ENCOUNTER — Inpatient Hospital Stay (HOSPITAL_COMMUNITY): Payer: Medicare HMO

## 2020-02-01 LAB — BASIC METABOLIC PANEL
Anion gap: 9 (ref 5–15)
BUN: 6 mg/dL — ABNORMAL LOW (ref 8–23)
CO2: 24 mmol/L (ref 22–32)
Calcium: 8.5 mg/dL — ABNORMAL LOW (ref 8.9–10.3)
Chloride: 103 mmol/L (ref 98–111)
Creatinine, Ser: 0.51 mg/dL (ref 0.44–1.00)
GFR calc Af Amer: >60 mL/min (ref >60)
GFR calc non Af Amer: >60 mL/min (ref >60)
Glucose, Bld: 116 mg/dL — ABNORMAL HIGH (ref 70–99)
Potassium: 3.6 mmol/L (ref 3.5–5.1)
Sodium: 136 mmol/L (ref 135–145)

## 2020-02-01 LAB — CBC WITH DIFFERENTIAL/PLATELET
Abs Immature Granulocytes: 0.1 10*3/uL — ABNORMAL HIGH (ref 0.00–0.07)
Basophils Absolute: 0.1 10*3/uL (ref 0.0–0.1)
Basophils Relative: 0 %
Eosinophils Absolute: 0.1 10*3/uL (ref 0.0–0.5)
Eosinophils Relative: 1 %
HCT: 31.4 % — ABNORMAL LOW (ref 36.0–46.0)
Hemoglobin: 10.6 g/dL — ABNORMAL LOW (ref 12.0–15.0)
Immature Granulocytes: 1 %
Lymphocytes Relative: 15 %
Lymphs Abs: 1.9 10*3/uL (ref 0.7–4.0)
MCH: 31.8 pg (ref 26.0–34.0)
MCHC: 33.8 g/dL (ref 30.0–36.0)
MCV: 94.3 fL (ref 80.0–100.0)
Monocytes Absolute: 1 10*3/uL (ref 0.1–1.0)
Monocytes Relative: 7 %
Neutro Abs: 10 10*3/uL — ABNORMAL HIGH (ref 1.7–7.7)
Neutrophils Relative %: 76 %
Platelets: 320 10*3/uL (ref 150–400)
RBC: 3.33 MIL/uL — ABNORMAL LOW (ref 3.87–5.11)
RDW: 13.7 % (ref 11.5–15.5)
WBC: 13 10*3/uL — ABNORMAL HIGH (ref 4.0–10.5)
nRBC: 0 % (ref 0.0–0.2)

## 2020-02-01 LAB — COMPREHENSIVE METABOLIC PANEL
ALT: 15 U/L (ref 0–44)
AST: 20 U/L (ref 15–41)
Albumin: 2.6 g/dL — ABNORMAL LOW (ref 3.5–5.0)
Alkaline Phosphatase: 86 U/L (ref 38–126)
Anion gap: 9 (ref 5–15)
BUN: 6 mg/dL — ABNORMAL LOW (ref 8–23)
CO2: 25 mmol/L (ref 22–32)
Calcium: 8.3 mg/dL — ABNORMAL LOW (ref 8.9–10.3)
Chloride: 104 mmol/L (ref 98–111)
Creatinine, Ser: 0.53 mg/dL (ref 0.44–1.00)
GFR calc Af Amer: >60 mL/min (ref >60)
GFR calc non Af Amer: >60 mL/min (ref >60)
Glucose, Bld: 107 mg/dL — ABNORMAL HIGH (ref 70–99)
Potassium: 2.3 mmol/L — CL (ref 3.5–5.1)
Sodium: 138 mmol/L (ref 135–145)
Total Bilirubin: 0.6 mg/dL (ref 0.3–1.2)
Total Protein: 5.7 g/dL — ABNORMAL LOW (ref 6.5–8.1)

## 2020-02-01 MED ORDER — METHYLPREDNISOLONE 4 MG PO TBPK
8.0000 mg | ORAL_TABLET | Freq: Every evening | ORAL | Status: AC
  Administered 2020-02-01: 21:00:00 4 mg via ORAL

## 2020-02-01 MED ORDER — POTASSIUM CHLORIDE 10 MEQ/100ML IV SOLN
10.0000 meq | INTRAVENOUS | Status: AC
  Administered 2020-02-01 (×6): 10 meq via INTRAVENOUS
  Filled 2020-02-01 (×6): qty 100

## 2020-02-01 MED ORDER — METHYLPREDNISOLONE 4 MG PO TBPK
4.0000 mg | ORAL_TABLET | Freq: Four times a day (QID) | ORAL | Status: AC
  Administered 2020-02-02 – 2020-02-05 (×10): 4 mg via ORAL
  Filled 2020-02-01 (×2): qty 21

## 2020-02-01 MED ORDER — METHYLPREDNISOLONE 4 MG PO TBPK
4.0000 mg | ORAL_TABLET | Freq: Three times a day (TID) | ORAL | Status: AC
  Administered 2020-02-01 (×3): 4 mg via ORAL

## 2020-02-01 NOTE — Evaluation (Signed)
Physical Therapy Assessment and Plan  Patient Details  Name: Autumn Le MRN: [id number] Date of Birth: 1946/05/08  PT Diagnosis: Abnormality of gait, Difficulty walking, Dizziness and giddiness, Impaired cognition, Impaired sensation, Low back pain, Muscle weakness and Pain in low back Rehab Potential: Excellent ELOS: 7-10 days   Today's Date: 02/01/2020 PT Individual Time: 1110-1208 PT Individual Time Calculation (min): 58 min    Problem List:  Patient Active Problem List   Diagnosis Date Noted  . Ruptured cerebral aneurysm ([place]) 01/31/2020  . SAH (subarachnoid hemorrhage) ([place]) 01/23/2020    Past Medical History:  Past Medical History:  Diagnosis Date  . COPD (chronic obstructive pulmonary disease) ([place])   . Degenerative joint disease of cervical spine   . Meniere's disease in remission, bilateral   . Osteopenia   . Vertigo    Past Surgical History:  Past Surgical History:  Procedure Laterality Date  . RADIOLOGY WITH ANESTHESIA N/A 01/23/2020   Procedure: RADIOLOGY WITH ANESTHESIA;  Surgeon: Consuella Lose, MD;  Location: [facility];  Service: Radiology;  Laterality: N/A;  . TONSILLECTOMY      Assessment & Plan Clinical Impression: Patient is a 74 y.o. year old female with history ofCOPD, vertigo; who was admitted via [facility] on 01/23/2020 with mild headache, lightheadedness, no recall of past 48 hours and some upper neck pain with blood near her bed and abrasion on her elbow. CT head showedextensive SAH about right cerebral hemisphere and basal cisterns and was negative for skull fracture. CTA head/neck revealed 9 mm aneurysm of communicating segment of right ICA--likely culprit of hemorrhage and additional approximate 4 mm aneurysm right A2 ACA.CTA chest negative for PE.She was transferred to [facility] for work-up and she underwent cerebral angio with successful coil embolization of right PCA aneurysm. Also note made of approximately 3 mm right 8 2-3  aneurysm and approximately 7 mm cavernous right ICA aneurysm.Shewas placed on Nimotop supportive care and postprocedure had intermittent nausea as well as ongoing headaches.Fioricet and hydrocodoneadd for prn use but she continues to be limited by significant neck pain and HA. Methylprednisolone dose pack added today to help ameliorate symptoms. TCD without evidence of vasospasms and patien to complete 21 total days of nimotop. Therapy ongoing and patient limited by pain, cognitive deficits affecting safety/awareness, balance deficits as well as dizziness and nausea. CIR recommended due to functional decline. Patient transferred to CIR on 01/31/2020 .   Patient currently requires min with mobility secondary to muscle weakness, decreased cardiorespiratoy endurance, decreased coordination, decreased attention, decreased awareness, decreased problem solving and decreased memory and decreased sitting balance, decreased standing balance, decreased postural control and decreased balance strategies.  Prior to hospitalization, patient was independent  with mobility and lived with Alone in a Apartment(lives in [place] in [place]) home.  Home access is  Elevator(lives on 5th floor).  Patient will benefit from skilled PT intervention to maximize safe functional mobility, minimize fall risk and decrease caregiver burden for planned discharge home with intermittent assist.  Anticipate patient will benefit from follow up [facility] at discharge.  PT - End of Session Activity Tolerance: Tolerates 30+ min activity with multiple rests Endurance Deficit: Yes Endurance Deficit Description: required frequent rest breaks, easily irritated with activity demand PT Assessment Rehab Potential (ACUTE/IP ONLY): Excellent PT Barriers to Discharge: Home environment access/layout;Medical stability PT Barriers to Discharge Comments: Plans to stay with her sister at d/c in 1 level home with 2-3 STE without  rails, low K+ on eval PT  Patient demonstrates impairments in the following area(s): Balance;Behavior;Edema;Endurance;Motor;Nutrition;Pain;Perception;Safety;Sensory;Skin Integrity PT Transfers Functional Problem(s): Bed Mobility;Bed to Chair;Car;Furniture;Floor PT Locomotion Functional Problem(s): Ambulation;Wheelchair Mobility;Stairs PT Plan PT Intensity: Minimum of 1-2 x/day ,45 to 90 minutes PT Frequency: 5 out of 7 days PT Duration Estimated Length of Stay: 7-10 days PT Treatment/Interventions: Ambulation/gait training;Community reintegration;DME/adaptive equipment instruction;Neuromuscular re-education;Psychosocial support;Stair training;UE/LE Strength taining/ROM;Wheelchair propulsion/positioning;Balance/vestibular training;Discharge planning;Functional electrical stimulation;Pain management;Skin care/wound management;Therapeutic Activities;UE/LE Coordination activities;Cognitive remediation/compensation;Disease management/prevention;Functional mobility training;Patient/family education;Splinting/orthotics;Therapeutic Exercise;Visual/perceptual remediation/compensation PT Transfers Anticipated Outcome(s): mod I PT Locomotion Anticipated Outcome(s): Mod I household distanced, supervision community distances >150 ft PT Recommendation Recommendations for Other Services: Neuropsych consult Follow Up Recommendations: Home health PT Patient destination: Home(Patient's sisters house) Equipment Recommended: To be determined Equipment Details: To be determined based on patient's progress  Skilled Therapeutic Intervention  In addition to the PT evaluation below, the patient performed the following skilled PT interventions: Discussed patient's bedside therapy status with Dr. Letta Pate this morning. Patient cleared to transfer only and perform light exercises. Stated need to avoid elevating patient's HR. Patient sitting EOB upon PT arrival. Patient alert and agreeable to PT session. Patient reported  8/10 low back pain, stated yes when asked about neck or head pain, but continued to point at low back when indicating pain site during session, RN made aware. PT provided repositioning, rest breaks, and distraction as pain interventions throughout session.   Therapeutic Activity: Bed Mobility: Patient performed rolling R/L with supervision for safety due to patient near edge of bed and supine to/from sit with supervision in a flat bed without use of bed rails. Patient required increased time for mobility due to low back pain. Transfers: Patient performed sit to/from stand x1 with CGA and stand pivot transfers to the [facility] and recliner in the room with min A-CGA without an AD, min A due to crouched posture during pivots and mild knee buckling. Patient performed peri-care and underwear management with supervision during toileting.   Assessed patient's back in sitting. Patient slightly TTP over T10-L1 spinous processes with mild edema. Patient unable to recall any injury to her back and stated that her pain started in the hospital, RN made aware. Patient also stated that she had a personal pillow in the main hospital. Searched for her pillow and unable to locate it in the room, RN made aware and calling previous unit to locate patient's pillow.   Patient required increased time for all mobility and communication throughout session due to pain and cognitive deficits on evaluation.  Patient in recliner his room with pillow placed behind her and seated on w/c cushion or comfort and increased support in sitting at end of session with breaks locked, chair alarm set, and all needs within reach.   Instructed pt in results of PT evaluation as detailed below, PT POC, rehab potential, rehab goals, and discharge recommendations. Additionally discussed CIR's policies regarding fall safety and use of chair alarm and/or quick release belt. Pt verbalized understanding and in agreement. Will update pt's family members as  they become available.   PT Evaluation Precautions/Restrictions Precautions Precautions: Fall Precaution Comments: goal SBP<140 Restrictions Weight Bearing Restrictions: No Pain Pain Assessment Pain Scale: 0-10 Pain Score: 8  Pain Type: Acute pain Pain Location: Back Pain Orientation: Lower Pain Descriptors / Indicators: Aching;Throbbing Pain Frequency: Constant Pain Onset: On-going Patients Stated Pain Goal: 3 Pain Intervention(s): Repositioned Home Living/Prior Functioning Home Living Available Help at Discharge: Family Type of Home: Apartment(lives in [place] in [place]) Home Access: Elevator(lives on 5th  floor) Home Layout: One level Bathroom Shower/Tub: Chiropodist: Standard Additional Comments: plans to d/c to sister's home - one level with 3 steps to enter, tub/shower  Lives With: Alone Prior Function Level of Independence: Independent with basic ADLs;Independent with homemaking with ambulation;Independent with gait;Independent with transfers  Able to Take Stairs?: Yes Driving: Yes Vocation: Retired Vision/Perception  Vision - Risk analyst: Within Advertising copywriter Alignment/Gaze Preference: Within Defined Limits Tracking/Visual Pursuits: Able to track stimulus in all quads without difficulty Perception Perception: Within Functional Limits Praxis Praxis: Intact  Cognition Overall Cognitive Status: Within Functional Limits for tasks assessed Arousal/Alertness: Awake/alert Orientation Level: Oriented X4 Attention: Sustained Sustained Attention: Impaired Sustained Attention Impairment: Verbal basic;Functional basic Memory: Impaired Memory Impairment: Retrieval deficit;Decreased recall of new information Problem Solving: Impaired Problem Solving Impairment: Verbal basic;Functional basic Safety/Judgment: Appears intact Sensation Sensation Light Touch: Appears Intact(does report intermittent  tingling of B plantar surface of her feet, not present during evaluation) Proprioception: Appears Intact Coordination Gross Motor Movements are Fluid and Coordinated: No Coordination and Movement Description: Generalized weakness and new onset low back pain, limited assessment due to low K+ on eval Heel Shin Test: decreased speed bilaterally Motor  Motor Motor: Other (comment) Motor - Skilled Clinical Observations: generalized weakness, decreased activity tolernace, new onset low back pain  Mobility Bed Mobility Bed Mobility: Rolling Right;Rolling Left;Supine to Sit;Sit to Supine Rolling Right: Supervision/verbal cueing Rolling Left: Supervision/Verbal cueing Supine to Sit: Supervision/Verbal cueing Sit to Supine: Supervision/Verbal cueing Transfers Transfers: Sit to Stand;Stand to Sit;Stand Pivot Transfers Sit to Stand: Contact Guard/Touching assist Stand to Sit: Contact Guard/Touching assist Stand Pivot Transfers: Minimal Assistance - Patient > 75% Transfer (Assistive device): None Locomotion  Gait Ambulation: No(pt with low K+ on eval, limited to bedside and transfers in the room per MD) Gait Gait: No Stairs / Additional Locomotion Stairs: No(pt with low K+ on eval, limited to bedside and transfers in the room per MD) Wheelchair Mobility Wheelchair Mobility: No(pt with low K+ on eval, limited to bedside and transfers in the room per MD)  Trunk/Postural Assessment  Cervical Assessment Cervical Assessment: Within Functional Limits Thoracic Assessment Thoracic Assessment: Within Functional Limits Lumbar Assessment Lumbar Assessment: Within Functional Limits Postural Control Postural Control: Within Functional Limits  Balance Balance Balance Assessed: Yes Static Sitting Balance Static Sitting - Balance Support: No upper extremity supported;Feet unsupported Static Sitting - Level of Assistance: 7: Independent Dynamic Sitting Balance Dynamic Sitting - Balance Support: No  upper extremity supported;Feet unsupported Dynamic Sitting - Level of Assistance: 5: Stand by assistance Dynamic Sitting - Balance Activities: Lateral lean/weight shifting;Forward lean/weight shifting;Reaching for objects Static Standing Balance Static Standing - Balance Support: No upper extremity supported;During functional activity Static Standing - Level of Assistance: 4: Min assist Dynamic Standing Balance Dynamic Standing - Balance Support: No upper extremity supported;During functional activity Dynamic Standing - Level of Assistance: 4: Min assist Dynamic Standing - Balance Activities: Lateral lean/weight shifting;Forward lean/weight shifting;Reaching for objects Extremity Assessment  RUE Assessment RUE Assessment: Within Functional Limits General Strength Comments: grossly 4+/5 LUE Assessment LUE Assessment: Within Functional Limits General Strength Comments: grossly 4+/5 RLE Assessment RLE Assessment: Exceptions to [facility] Active Range of Motion (AROM) Comments: WFL General Strength Comments: Grossly in sitting 4-4+/5 throughout LLE Assessment LLE Assessment: Exceptions to [facility] Active Range of Motion (AROM) Comments: WFL General Strength Comments: Grossly in sitting 4-4+/5 throughout    Refer to Care Plan for Long Term Goals  Recommendations for other services: Neuropsych  Discharge Criteria: Patient will be  discharged from PT if patient refuses treatment 3 consecutive times without medical reason, if treatment goals not met, if there is a change in medical status, if patient makes no progress towards goals or if patient is discharged from hospital.  The above assessment, treatment plan, treatment alternatives and goals were discussed and mutually agreed upon: by patient  Doreene Burke PT, DPT  02/01/2020, 12:30 PM

## 2020-02-01 NOTE — Evaluation (Signed)
Speech Language Pathology Assessment and Plan  Patient Details  Name: Autumn Le MRN: [id number] Date of Birth: [birth year]  SLP Diagnosis: Cognitive Impairments  Rehab Potential: Good ELOS: 10 days    Today's Date: 02/01/2020 SLP Individual Time: 1245-8099 SLP Individual Time Calculation (min): 56 min   Problem List:  Patient Active Problem List   Diagnosis Date Noted  . Ruptured cerebral aneurysm ([place]) 01/31/2020  . SAH (subarachnoid hemorrhage) ([place]) 01/23/2020   Past Medical History:  Past Medical History:  Diagnosis Date  . COPD (chronic obstructive pulmonary disease) ([place])   . Degenerative joint disease of cervical spine   . Meniere's disease in remission, bilateral   . Osteopenia   . Vertigo    Past Surgical History:  Past Surgical History:  Procedure Laterality Date  . RADIOLOGY WITH ANESTHESIA N/A 01/23/2020   Procedure: RADIOLOGY WITH ANESTHESIA;  Surgeon: Consuella Lose, MD;  Location: [facility];  Service: Radiology;  Laterality: N/A;  . TONSILLECTOMY      Assessment / Plan / Recommendation Clinical Impression   Autumn Le is a 74 year old female with history ofCOPD, vertigo; who was admitted via [facility] on 01/23/2020 with mild headache, lightheadedness, no recall of past 48 hours and some upper neck pain with blood near her bed and abrasion on her elbow. CT head showedextensive SAH about right cerebral hemisphere and basal cisterns and was negative for skull fracture. CTA head/neck revealed 9 mm aneurysm of communicating segment of right ICA--likely culprit of hemorrhage and additional approximate 4 mm aneurysm right A2 ACA.CTA chest negative for PE.She was transferred to [facility] for work-up and she underwent cerebral angio with successful coil embolization of right PCA aneurysm. Also note made of approximately 3 mm right 8 2-3 aneurysm and approximately 7 mm cavernous right ICA aneurysm.Shewas placed on Nimotop supportive care and  postprocedure had intermittent nausea as well as ongoing headaches.Fioricet and hydrocodoneadd for prn use but she continues to be limited by significant neck pain and HA. Methylprednisolone dose pack added today to help ameliorate symptoms. TCD without evidence of vasospasms and patien to complete 21 total days of nimotop. Therapy ongoing and patient limited by pain, cognitive deficits affecting safety/awareness, balance deficits as well as dizziness and nausea. CIR recommended due to functional decline.SLP evaluation was compelted on 02/01/20 with results as follows:  Pt presents with mild cognitive deficits characterized by decreased intellectual awareness of deficits, decreased recall of information, decreased functional problem solving, and decreased sustained attention to tasks. Pt is also lethargic and suffering from a great deal of pain as well as dizziness and nausea which are certainly contributing to pt's current presentation.  Currently pt requires min assist to complete tasks.   Given the abovementioned deficits, pt would benefit from skilled ST while inpatient in order to maximize functional independence and reduce burden of care prior to discharge.  Anticipate that pt may benefit from 24/7 supervision at discharge in addition to [place] follow up at next level of care if her lethargy, pain level, and dizziness/nausea do not subside as these factors would likely impact pt's safety awareness with functional tasks.  Discussed these recommendations with pt and her family (niece and sister) who expressed concerns about being able to provide 24/7 supervision.  Will update d/c recommendations pending progress made while inpatient.     Skilled Therapeutic Interventions          Cognitive-linguistic evaluation completed with results and recommendations reviewed with family.     SLP Assessment  Patient will need skilled [facility] during CIR admission    Recommendations   Patient destination: Home Follow up Recommendations: Home Health SLP;24 hour supervision/assistance Equipment Recommended: None recommended by SLP    SLP Frequency 3 to 5 out of 7 days   SLP Duration  SLP Intensity  SLP Treatment/Interventions 10 days  Minumum of 1-2 x/day, 30 to 90 minutes  Cognitive remediation/compensation;Cueing hierarchy;Patient/family education;Internal/external aids;Environmental controls    Pain Pain Assessment Pain Scale: 0-10 Pain Score: 8  Pain Type: Acute pain Pain Location: Neck Pain Descriptors / Indicators: Aching Pain Frequency: Constant Pain Onset: On-going Patients Stated Pain Goal: 3 Pain Intervention(s): Repositioned;Heat applied Multiple Pain Sites: No  Prior Functioning Cognitive/Linguistic Baseline: Within functional limits Type of Home: Apartment  Lives With: Alone Available Help at Discharge: Family;Available PRN/intermittently Vocation: Retired  SLP Evaluation Cognition Overall Cognitive Status: Impaired/Different from baseline Arousal/Alertness: Awake/alert Orientation Level: Oriented X4 Attention: Sustained Sustained Attention: Impaired Sustained Attention Impairment: Functional basic;Verbal basic Memory: Impaired Memory Impairment: Retrieval deficit;Decreased recall of new information Awareness: Impaired Awareness Impairment: Intellectual impairment Problem Solving: Impaired Problem Solving Impairment: Verbal basic;Functional basic Behaviors: Poor frustration tolerance Safety/Judgment: Impaired  Comprehension Auditory Comprehension Overall Auditory Comprehension: Appears within functional limits for tasks assessed Expression Expression Primary Mode of Expression: Verbal Verbal Expression Overall Verbal Expression: Appears within functional limits for tasks assessed Oral Motor Oral Motor/Sensory Function Overall Oral Motor/Sensory Function: Within functional limits Motor Speech Overall Motor Speech: Appears  within functional limits for tasks assessed     Short Term Goals: Week 1: SLP Short Term Goal 1 (Week 1): STG=LTG due to ELOS  Refer to Care Plan for Long Term Goals  Recommendations for other services: None   Discharge Criteria: Patient will be discharged from SLP if patient refuses treatment 3 consecutive times without medical reason, if treatment goals not met, if there is a change in medical status, if patient makes no progress towards goals or if patient is discharged from hospital.  The above assessment, treatment plan, treatment alternatives and goals were discussed and mutually agreed upon: by patient and by family  Autumn Le, Selinda Orion 02/01/2020, 4:08 PM

## 2020-02-01 NOTE — Progress Notes (Signed)
CRITICAL VALUE ALERT  Critical Value:  Potassium 2.3  Date & Time Notied:  3/21 715  Provider Notified: [id number]  Orders Received/Actions taken: MD to look at patient's lab.

## 2020-02-01 NOTE — Plan of Care (Signed)
  Problem: Consults Goal: RH GENERAL PATIENT EDUCATION Description: See Patient Education module for education specifics. Outcome: Progressing   Problem: RH BLADDER ELIMINATION Goal: RH STG MANAGE BLADDER WITH ASSISTANCE Description: STG Manage Bladder With minimal Assistance Outcome: Progressing   Problem: RH SKIN INTEGRITY Goal: RH STG MAINTAIN SKIN INTEGRITY WITH ASSISTANCE Description: STG Maintain Skin Integrity With Assistance. Outcome: Progressing

## 2020-02-01 NOTE — Progress Notes (Signed)
[place] PHYSICAL MEDICINE & REHABILITATION PROGRESS NOTE   Subjective/Complaints:  Discussed low K+, no vomiting no diarrhea, +poor intake, no diuretics  ROS neg CP, SOB, N/V/D Objective:   VAS [place] TRANSCRANIAL DOPPLER  Result Date: 01/30/2020  Transcranial Doppler Indications: Subarachnoid hemorrhage. Limitations: poor acoustic windows Comparison Study: 01/28/2020 TCD Performing Technologist: Gertie Fey MHA, RDMS, RVT, RDCS  Examination Guidelines: A complete evaluation includes B-mode imaging, spectral Doppler, color Doppler, and power Doppler as needed of all accessible portions of each vessel. Bilateral testing is considered an integral part of a complete examination. Limited examinations for reoccurring indications may be performed as noted.  +----------+-------------+----------+-----------+------------------+ RIGHT TCD Right VM (cm)Depth (cm)Pulsatility     Comment       +----------+-------------+----------+-----------+------------------+ MCA                                         Unable to insonate +----------+-------------+----------+-----------+------------------+ ACA                                         Unable to insonate +----------+-------------+----------+-----------+------------------+ Term ICA                                    Unable to insonate +----------+-------------+----------+-----------+------------------+ PCA           27.00                 1.21                       +----------+-------------+----------+-----------+------------------+ Opthalmic     22.00                 1.46                       +----------+-------------+----------+-----------+------------------+ ICA siphon                                  Unable to insonate +----------+-------------+----------+-----------+------------------+ Vertebral                                   Unable to insonate  +----------+-------------+----------+-----------+------------------+  +----------+------------+----------+-----------+------------------+ LEFT TCD  Left VM (cm)Depth (cm)Pulsatility     Comment       +----------+------------+----------+-----------+------------------+ MCA          51.00                 1.34                       +----------+------------+----------+-----------+------------------+ ACA                                        Unable to insonate +----------+------------+----------+-----------+------------------+ Term ICA     49.00                 1.34                       +----------+------------+----------+-----------+------------------+ PCA  Unable to insonate +----------+------------+----------+-----------+------------------+ Opthalmic    18.00                 1.62                       +----------+------------+----------+-----------+------------------+ ICA siphon                                 Unable to insonate +----------+------------+----------+-----------+------------------+ Vertebral    -42.00                1.40                       +----------+------------+----------+-----------+------------------+ Distal ICA   33.00                                            +----------+------------+----------+-----------+------------------+  +------------+-------+------------------+             VM cm/s     Comment       +------------+-------+------------------+ Prox Basilar-56.00                    +------------+-------+------------------+ Dist Basilar       Unable to insonate +------------+-------+------------------+ +---------------------+----+ Left Lindegaard Ratio1.55 +---------------------+----+    Preliminary    Recent Labs    02/01/20 0541  WBC 13.0*  HGB 10.6*  HCT 31.4*  PLT 320   Recent Labs    02/01/20 0541  NA 138  K 2.3*  CL 104  CO2 25  GLUCOSE 107*  BUN 6*  CREATININE  0.53  CALCIUM 8.3*   No intake or output data in the 24 hours ending 02/01/20 0744   Physical Exam: Vital Signs Blood pressure (!) 147/65, pulse 61, temperature 98 F (36.7 C), temperature source Oral, resp. rate 16, height 5\' 4"  (1.626 m), weight 56.4 kg, SpO2 95 %.  General: No acute distress Mood and affect are appropriate Heart: Regular rate and rhythm no rubs murmurs or extra sounds Lungs: Clear to auscultation, breathing unlabored, no rales or wheezes Abdomen: Positive bowel sounds, soft nontender to palpation, nondistended Extremities: No clubbing, cyanosis, or edema Skin: No evidence of breakdown, no evidence of rash Neurologic: Cranial nerves II through XII intact, motor strength is 5/5 in bilateral deltoid, bicep, tricep, grip, hip flexor, knee extensors, ankle dorsiflexor and plantar flexor Oriented x 3  But was confused about breakfast, did not remember eating part of cookie this am  Musculoskeletal: Full range of motion in all 4 extremities. No joint swelling     Assessment/Plan: 1. Functional deficits secondary to [facility]  which require 3+ hours per day of interdisciplinary therapy in a comprehensive inpatient rehab setting.  Physiatrist is providing close team supervision and 24 hour management of active medical problems listed below.  Physiatrist and rehab team continue to assess barriers to discharge/monitor patient progress toward functional and medical goals  Care Tool:  Bathing              Bathing assist       Upper Body Dressing/Undressing Upper body dressing   What is the patient wearing?: Hospital gown only    Upper body assist      Lower Body Dressing/Undressing Lower body dressing      What is the patient wearing?: Underwear/pull up,  Hospital gown only     Lower body assist Assist for lower body dressing: Supervision/Verbal cueing Assistive Device Comment: walker   Toileting Toileting    Toileting assist Assist for toileting: Contact  Guard/Touching assist     Transfers Chair/bed transfer  Transfers assist     Chair/bed transfer assist level: Contact Guard/Touching assist     Locomotion Ambulation   Ambulation assist              Walk 10 feet activity   Assist           Walk 50 feet activity   Assist           Walk 150 feet activity   Assist           Walk 10 feet on uneven surface  activity   Assist           Wheelchair     Assist               Wheelchair 50 feet with 2 turns activity    Assist            Wheelchair 150 feet activity     Assist          Blood pressure (!) 147/65, pulse 61, temperature 98 F (36.7 C), temperature source Oral, resp. rate 16, height 5\' 4"  (1.626 m), weight 56.4 kg, SpO2 95 %.  Medical Problem List and Plan: 1. Decline in cognition and mobility secondary to Right SAH -patient may  shower Bedside therapy today, due to very low K+, risk of cardiac irritability  -ELOS/Goals: 7-10d 2. Antithrombotics: -DVT/anticoagulation:Mechanical:Sequential compression devices, below kneeBilateral lower extremities -antiplatelet therapy: N/a 3.Headaches/Pain Management:Will add Topamax and titrate slowly. Will add K pad for shoulder pain and continue robaxin prnfor shoulder pain. D/C ibuprofen. Will change hydrocodone to oxycodone and monitor. 4. Mood:LCSW to follow for evaluation and support. -antipsychotic agents: N/A 5. Neuropsych: This patientiscapable of making decisions on herown behalf. 6. Skin/Wound Care:Routine pressure relief measures. 7. Fluids/Electrolytes/Nutrition:Monitor I/O.Check labs in am. 8. H/o Meniere's disease:In remission. 9. COPD:Continue Breo. Will resume claritin. 10. Nausea/dizziness: Vestibular evaluation/tx to help manage symptoms.  Will also order orthostatic BP.May need to add scopolamine patch.  11. HTN: Monitor  BP tid--goal <140. On Nimotop started on 3/14-->end date 4/4 12. Leucocytosis: Monitor for signs of infection and trend out--may rise with addition of steroids.  13.  Hypo K+, likely nutritional will do K rider x 6 today , repeat BMET in 6 hours, add po KCL after IV  LOS: 1 days A FACE TO FACE EVALUATION WAS PERFORMED  04-05-1972 02/01/2020, 7:44 AM

## 2020-02-01 NOTE — Evaluation (Signed)
Occupational Therapy Assessment and Plan  Patient Details  Name: Autumn Le MRN: [id number] Date of Birth: 1946-01-21  OT Diagnosis: acute pain, cognitive deficits and muscle weakness (generalized) Rehab Potential: Rehab Potential (ACUTE ONLY): Good ELOS: 7-10 days   Today's Date: 02/01/2020 OT Individual Time: 5916-3846 OT Individual Time Calculation (min): 72 min     Problem List:  Patient Active Problem List   Diagnosis Date Noted  . Ruptured cerebral aneurysm ([place]) 01/31/2020  . SAH (subarachnoid hemorrhage) ([place]) 01/23/2020    Past Medical History:  Past Medical History:  Diagnosis Date  . COPD (chronic obstructive pulmonary disease) ([place])   . Degenerative joint disease of cervical spine   . Meniere's disease in remission, bilateral   . Osteopenia   . Vertigo    Past Surgical History:  Past Surgical History:  Procedure Laterality Date  . RADIOLOGY WITH ANESTHESIA N/A 01/23/2020   Procedure: RADIOLOGY WITH ANESTHESIA;  Surgeon: Consuella Lose, MD;  Location: [facility];  Service: Radiology;  Laterality: N/A;  . TONSILLECTOMY      Assessment & Plan Clinical Impression: Patient is a 74 y.o.  female with history ofCOPD, vertigo; who was admitted via [facility] on 01/23/2020 with mild headache, lightheadedness, no recall of past 48 hours and some upper neck pain with blood near her bed and abrasion on her elbow. CT head showedextensive SAH about right cerebral hemisphere and basal cisterns and was negative for skull fracture. CTA head/neck revealed 9 mm aneurysm of communicating segment of right ICA--likely culprit of hemorrhage and additional approximate 4 mm aneurysm right A2 ACA.CTA chest negative for PE.She was transferred to [facility] for work-up and she underwent cerebral angio with successful coil embolization of right PCA aneurysm. Also note made of approximately 3 mm right 8 2-3 aneurysm and approximately 7 mm cavernous right ICA aneurysm.Shewas placed  on Nimotop supportive care and postprocedure had intermittent nausea as well as ongoing headaches.Fioricet and hydrocodoneadd for prn use but she continues to be limited by significant neck pain and HA. Methylprednisolone dose pack added today to help ameliorate symptoms. TCD without evidence of vasospasms and patien to complete 21 total days of nimotop. Therapy ongoing and patient limited by pain, cognitive deficits affecting safety/awareness, balance deficits as well as dizziness and nausea. CIR recommended due to functional decline.Patient transferred to CIR on 01/31/2020 .    Patient currently requires min with basic self-care skills secondary to muscle weakness, decreased cardiorespiratoy endurance, decreased attention, decreased awareness, decreased problem solving, decreased safety awareness and decreased memory and decreased standing balance, decreased postural control and decreased balance strategies.  Prior to hospitalization, patient could complete ADLs with independent .  Patient will benefit from skilled intervention to increase independence with basic self-care skills prior to discharge home with care partner.  Anticipate patient will require intermittent supervision and follow up home health.  OT - End of Session Activity Tolerance: Tolerates 30+ min activity with multiple rests Endurance Deficit: Yes Endurance Deficit Description: required frequent rest breaks, easily irritated with activity demand OT Assessment Rehab Potential (ACUTE ONLY): Good OT Barriers to Discharge: Home environment access/layout OT Barriers to Discharge Comments: 3 steps to enter at sister's house OT Patient demonstrates impairments in the following area(s): Balance;Cognition;Endurance;Motor;Pain;Safety OT Basic ADL's Functional Problem(s): Grooming;Bathing;Dressing;Toileting OT Advanced ADL's Functional Problem(s): Simple Meal Preparation OT Transfers Functional Problem(s): Toilet;Tub/Shower OT  Additional Impairment(s): None OT Plan OT Intensity: Minimum of 1-2 x/day, 45 to 90 minutes OT Frequency: 5 out of 7 days OT Duration/Estimated Length of  Stay: 7-10 days OT Treatment/Interventions: Balance/vestibular training;Cognitive remediation/compensation;Discharge planning;Community reintegration;Disease mangement/prevention;DME/adaptive equipment instruction;Pain management;Patient/family education;Psychosocial support;Self Care/advanced ADL retraining;Therapeutic Activities;Therapeutic Exercise;UE/LE Strength taining/ROM OT Self Feeding Anticipated Outcome(s): Independent OT Basic Self-Care Anticipated Outcome(s): Mod I OT Toileting Anticipated Outcome(s): Mod I OT Bathroom Transfers Anticipated Outcome(s): Mod I OT Recommendation Recommendations for Other Services: Neuropsych consult Patient destination: Home Follow Up Recommendations: Home health OT Equipment Recommended: None recommended by OT Equipment Details: has tub transfer bench   Skilled Therapeutic Intervention OT eval completed with discussion of rehab process, OT purpose, POC, ELOS, and goals.  ADL assessment completed at bedside due to bedside orders secondary to low K+ with focus on functional tasks and transfers.  Pt declined bathing and dressing due to pain and fatigue.  Pt easily frustrated with direction, therefore focused on more automatic tasks.  Completed stand pivot transfers to [facility] with pt able to complete transfer and toileting with Min assist.  Completed grooming tasks seated EOB with setup assist for items.  Pt reports pain in lower back, requesting to return to bed.  Applied k-pad and notified RN of pt's pain.   OT Evaluation Precautions/Restrictions  Restrictions Weight Bearing Restrictions: No General   Vital Signs Therapy Vitals Pulse Rate: (!) 58 BP: (!) 149/65 Patient Position (if appropriate): Lying Pain Pain Assessment Pain Scale: 0-10 Pain Score: 8  Pain Type: Acute pain Pain Location:  Back Pain Orientation: Lower Pain Descriptors / Indicators: Aching;Throbbing Pain Frequency: Constant Pain Onset: On-going Patients Stated Pain Goal: 3 Pain Intervention(s): Repositioned Home Living/Prior Functioning Home Living Family/patient expects to be discharged to:: Private residence Living Arrangements: Alone Available Help at Discharge: Family Type of Home: Apartment(lives in [address] in [place]) Home Access: Elevator(lives on 5th floor) Home Layout: One level Bathroom Shower/Tub: Chiropodist: Standard Additional Comments: plans to d/c to sister's home - one level with 3 steps to enter, tub/shower  Lives With: Alone IADL History Homemaking Responsibilities: Yes Meal Prep Responsibility: Primary Laundry Responsibility: Primary Cleaning Responsibility: Primary Bill Paying/Finance Responsibility: Primary Shopping Responsibility: Primary Current License: Yes Prior Function Level of Independence: Independent with basic ADLs, Independent with homemaking with ambulation, Independent with gait, Independent with transfers  Able to Take Stairs?: Yes Driving: Yes Vocation: Retired ADL ADL Eating: Set up Where Assessed-Eating: Bed level Grooming: Setup Where Assessed-Grooming: Edge of bed Toileting: Minimal assistance Where Assessed-Toileting: Bedside Commode Toilet Transfer: Minimal assistance Armed forces technical officer Method: Arts development officer: Bedside commode ADL Comments: Limited activity on eval due to easily frustrated with direction, as well as limited to bedside therapy due to low K+ Vision Baseline Vision/History: Wears glasses Wears Glasses: At all times Patient Visual Report: No change from baseline Vision Assessment?: Yes Eye Alignment: Within Functional Limits Ocular Range of Motion: Within Functional Limits Alignment/Gaze Preference: Within Defined Limits Additional Comments: Pt with increased  frustration as session progressed therefore limited vision assessment, will continue to assess Perception  Perception: Within Functional Limits Cognition Overall Cognitive Status: Impaired/Different from baseline Arousal/Alertness: Awake/alert Orientation Level: Person;Place;Situation Person: Oriented Place: Oriented Situation: Oriented Year: 2021 Month: March Day of Week: Incorrect(Saturday) Memory: Impaired Memory Impairment: Decreased recall of new information Immediate Memory Recall: Sock;Blue;Bed Memory Recall Sock: Without Cue Memory Recall Blue: With Cue Memory Recall Bed: Not able to recall Attention: Selective Selective Attention: Appears intact Awareness: Impaired Awareness Impairment: Intellectual impairment;Emergent impairment Problem Solving: Impaired Safety/Judgment: Impaired Sensation Sensation Light Touch: Appears Intact Proprioception: Appears Intact Coordination Gross Motor Movements are Fluid and Coordinated: No  Fine Motor Movements are Fluid and Coordinated: Yes Finger Nose Finger Test: WNL Mobility  Bed Mobility Bed Mobility: Rolling Right;Right Sidelying to Sit Rolling Right: Supervision/verbal cueing Right Sidelying to Sit: Supervision/Verbal cueing Transfers Sit to Stand: Minimal Assistance - Patient > 75% Stand to Sit: Minimal Assistance - Patient > 75%  Extremity/Trunk Assessment RUE Assessment RUE Assessment: Within Functional Limits General Strength Comments: grossly 4+/5 LUE Assessment LUE Assessment: Within Functional Limits General Strength Comments: grossly 4+/5     Refer to Care Plan for Long Term Goals  Recommendations for other services: Neuropsych   Discharge Criteria: Patient will be discharged from OT if patient refuses treatment 3 consecutive times without medical reason, if treatment goals not met, if there is a change in medical status, if patient makes no progress towards goals or if patient is discharged from  hospital.  The above assessment, treatment plan, treatment alternatives and goals were discussed and mutually agreed upon: by patient  [place] [facility] 02/01/2020, 12:07 PM

## 2020-02-02 ENCOUNTER — Inpatient Hospital Stay (HOSPITAL_COMMUNITY): Payer: Medicare HMO

## 2020-02-02 ENCOUNTER — Inpatient Hospital Stay (HOSPITAL_COMMUNITY): Payer: Medicare HMO | Admitting: Occupational Therapy

## 2020-02-02 ENCOUNTER — Inpatient Hospital Stay (HOSPITAL_COMMUNITY): Payer: Medicare HMO | Admitting: Speech Pathology

## 2020-02-02 LAB — MAGNESIUM: Magnesium: 1.9 mg/dL (ref 1.7–2.4)

## 2020-02-02 MED ORDER — ACETAMINOPHEN 325 MG PO TABS
650.0000 mg | ORAL_TABLET | Freq: Three times a day (TID) | ORAL | Status: DC
  Administered 2020-02-02 – 2020-02-11 (×28): 650 mg via ORAL
  Filled 2020-02-02 (×30): qty 2

## 2020-02-02 MED ORDER — DICLOFENAC SODIUM 1 % EX GEL
2.0000 g | Freq: Four times a day (QID) | CUTANEOUS | Status: DC
  Administered 2020-02-02 – 2020-02-11 (×27): 2 g via TOPICAL
  Filled 2020-02-02 (×2): qty 100

## 2020-02-02 MED ORDER — AMLODIPINE BESYLATE 5 MG PO TABS
5.0000 mg | ORAL_TABLET | Freq: Every day | ORAL | Status: DC
  Administered 2020-02-02 – 2020-02-07 (×6): 5 mg via ORAL
  Filled 2020-02-02 (×7): qty 1

## 2020-02-02 NOTE — Progress Notes (Signed)
Physical Therapy Session Note  Patient Details  Name: Autumn Le MRN: [id number] Date of Birth: 1946/02/26  Today's Date: 02/02/2020 PT Individual Time: 0906-1006 PT Individual Time Calculation (min): 60 min   Short Term Goals: Week 1:  PT Short Term Goal 1 (Week 1): STG=LTG due to short ELOS.  Skilled Therapeutic Interventions/Progress Updates:   Pt ok for OOB mobility as she received multiple units of potassium prior to session. Pt's orthostatic vitals assessed initially. Systolic blood pressure >140. PA aware and ok with mobility.  Supin>sit with supervision. Pt describes 4/10 pain in head that is constant. Sit>stand with CGA and without assistive device initially. Pt describes sharp, "stabbing" pain in lower L back and L hip. Seated Rest break prior to additional sit to stand with RW. After standing for several minutes pt verbalizes improvement in back pain symptoms. Ambulation 100' x2 with extended seated rest break. Pt has exacerbation of back pain when transitioning from stand to sit. Pain seems to be stable and improved while ambulating, however.  Pt left seated in recliner. Chair alarm intact and all needs within reach.  Therapy Documentation Precautions:  Precautions Precautions: Fall Precaution Comments: goal SBP<140 Restrictions Weight Bearing Restrictions: No    Vital Signs: Orthostatic Vitals: Supine: 152/70 HR 59 Sitting: 151/66 HR 58 Standing: 146/62 HR 63   Therapy/Group: Individual Therapy  Beau Fanny 02/02/2020, 4:05 PM

## 2020-02-02 NOTE — Care Management (Signed)
Inpatient Rehabilitation Center Individual Statement of Services  Patient Name:  BALEY SHANDS  Date:  02/02/2020  Welcome to the Inpatient Rehabilitation Center.  Our goal is to provide you with an individualized program based on your diagnosis and situation, designed to meet your specific needs.  With this comprehensive rehabilitation program, you will be expected to participate in at least 3 hours of rehabilitation therapies Monday-Friday, with modified therapy programming on the weekends.  Your rehabilitation program will include the following services:  Physical Therapy (PT), Occupational Therapy (OT), Speech Therapy (ST), 24 hour per day rehabilitation nursing, Therapeutic Recreaction (TR), Neuropsychology, Case Management (Social Worker), Rehabilitation Medicine, Nutrition Services and Pharmacy Services  Weekly team conferences will be held on Wednesdays to discuss your progress.  Your Social Worker will talk with you frequently to get your input and to update you on team discussions.  Team conferences with you and your family in attendance may also be held.  Expected length of stay: 7-10 days Overall anticipated outcome: Supervision goals  Depending on your progress and recovery, your program may change. Your Social Automotive engineer will coordinate services and will keep you informed of any changes. Your Social Worker's/Care Manager's name and contact numbers are listed  below.  The following services may also be recommended but are not provided by the Inpatient Rehabilitation Center:    Home Health Rehabiltiation Services  Outpatient Rehabilitation Services   Arrangements will be made to provide these services after discharge if needed.  Arrangements include referral to agencies that provide these services.  Your insurance has been verified to be:  [employer] Your primary doctor is:  Marisue Ivan, MD  Pertinent information will be shared with your doctor and your  insurance company.  Care Manager/Social Worker:  Chana Bode, RN  [phone] or [phone]  Information discussed with and copy given to patient by: Pamelia Hoit, 02/02/2020, 5:31 PM

## 2020-02-02 NOTE — Progress Notes (Signed)
[place] PHYSICAL MEDICINE & REHABILITATION PROGRESS NOTE   Subjective/Complaints: Patient is complaining of neck and back pain. Pain is located around C6-C7 of neck and left sided lumbar paraspinals. CT Neck reviewed and shows osteophytes at this level of the cervical spine. She is not participating well with SLP this morning due to her pain, she rates 8/10.   ROS neg CP, SOB, N/V/D Objective:   No results found. Recent Labs    02/01/20 0541  WBC 13.0*  HGB 10.6*  HCT 31.4*  PLT 320   Recent Labs    02/01/20 0541 02/01/20 1650  NA 138 136  K 2.3* 3.6  CL 104 103  CO2 25 24  GLUCOSE 107* 116*  BUN 6* 6*  CREATININE 0.53 0.51  CALCIUM 8.3* 8.5*    Intake/Output Summary (Last 24 hours) at 02/02/2020 0910 Last data filed at 02/02/2020 0700 Gross per 24 hour  Intake 860 ml  Output --  Net 860 ml     Physical Exam: Vital Signs Blood pressure (!) 151/71, pulse 70, temperature 98.4 F (36.9 C), temperature source Oral, resp. rate 16, height 5\' 4"  (1.626 m), weight 56.4 kg, SpO2 98 %. General: No acute distress Mood and affect are appropriate Heart: Regular rate and rhythm no rubs murmurs or extra sounds Lungs: Clear to auscultation, breathing unlabored, no rales or wheezes Abdomen: Positive bowel sounds, soft nontender to palpation, nondistended Extremities: No clubbing, cyanosis, or edema Skin: Alert and oriented. No evidence of breakdown, no evidence of rash Neurologic: Cranial nerves II through XII intact, motor strength is 5/5 in bilateral deltoid, bicep, tricep, grip, hip flexor, knee extensors, ankle dorsiflexor and plantar flexor Musculoskeletal: Full range of motion in all 4 extremities. No joint swelling Tenderness to palpation over C5-C6,C7 spinal processes and left lumbar paraspinals. Cervical osteophytes with kyphosis.   Assessment/Plan: 1. Functional deficits secondary to [facility]  which require 3+ hours per day of interdisciplinary therapy in a comprehensive  inpatient rehab setting.  Physiatrist is providing close team supervision and 24 hour management of active medical problems listed below.  Physiatrist and rehab team continue to assess barriers to discharge/monitor patient progress toward functional and medical goals  Care Tool:  Bathing  Bathing activity did not occur: Refused           Bathing assist       Upper Body Dressing/Undressing Upper body dressing Upper body dressing/undressing activity did not occur (including orthotics): Refused What is the patient wearing?: Hospital gown only    Upper body assist Assist Level: Minimal Assistance - Patient > 75%    Lower Body Dressing/Undressing Lower body dressing    Lower body dressing activity did not occur: Refused What is the patient wearing?: Underwear/pull up     Lower body assist Assist for lower body dressing: Supervision/Verbal cueing Assistive Device Comment: walker   Toileting Toileting    Toileting assist Assist for toileting: Supervision/Verbal cueing     Transfers Chair/bed transfer  Transfers assist     Chair/bed transfer assist level: Minimal Assistance - Patient > 75%     Locomotion Ambulation   Ambulation assist   Ambulation activity did not occur: Safety/medical concerns(pt with low K+ on eval, limited to bedside and transfers in the room per MD)          Walk 10 feet activity   Assist  Walk 10 feet activity did not occur: Safety/medical concerns        Walk 50 feet activity   Assist Walk  50 feet with 2 turns activity did not occur: Safety/medical concerns         Walk 150 feet activity   Assist Walk 150 feet activity did not occur: Safety/medical concerns         Walk 10 feet on uneven surface  activity   Assist Walk 10 feet on uneven surfaces activity did not occur: Safety/medical concerns(pt with low K+ on eval, limited to bedside and transfers in the room per MD)         Wheelchair     Assist      Wheelchair activity did not occur: Safety/medical concerns(pt with low K+ on eval, limited to bedside and transfers in the room per MD)         Wheelchair 50 feet with 2 turns activity    Assist    Wheelchair 50 feet with 2 turns activity did not occur: Safety/medical concerns       Wheelchair 150 feet activity     Assist  Wheelchair 150 feet activity did not occur: Safety/medical concerns       Blood pressure (!) 151/71, pulse 70, temperature 98.4 F (36.9 C), temperature source Oral, resp. rate 16, height 5\' 4"  (1.626 m), weight 56.4 kg, SpO2 98 %.  Medical Problem List and Plan: 1. Decline in cognition and mobility secondary to Right SAH -patient may  shower. -ELOS/Goals: 7-10d  -Continue CIR PT, OT, SLP 2. Antithrombotics: -DVT/anticoagulation:Mechanical:Sequential compression devices, below kneeBilateral lower extremities -antiplatelet therapy: N/a 3.Headaches/Pain Management:Will add Topamax and titrate slowly. Will add K pad for shoulder pain and continue robaxin prnfor shoulder pain. D/C ibuprofen. Will change hydrocodone to oxycodone and monitor.  3/22: Pain from cervical osteophytes at C5-C6 and C6-C7 8/10 to patient and limiting her participation in therapy. She has been using Kpads. Add diclofenac gel and schedule tylenol.  4. Mood:LCSW to follow for evaluation and support. -antipsychotic agents: N/A 5. Neuropsych: This patientiscapable of making decisions on herown behalf. 6. Skin/Wound Care:Routine pressure relief measures. 7. Fluids/Electrolytes/Nutrition:Monitor I/O.Check labs in am. 8. H/o Meniere's disease:In remission. 9. COPD:Continue Breo. Will resume claritin. 10. Nausea/dizziness: Vestibular evaluation/tx to help manage symptoms.  Will also order orthostatic BP.May need to add scopolamine patch.  11. HTN: Monitor BP tid--goal <140. On Nimotop started on 3/14-->end  date 4/4..  3/22: Has been consistently elevated, could be pain related. Add amlodipine 5mg .  12. Leucocytosis: Monitor for signs of infection and trend out--may rise with addition of steroids.  13.  Hypo K+, likely nutritional will do K rider x 6 today , repeat BMET in 6 hours, add po KCL after IV  3/21: K+ much improved to 3.6.   LOS: 2 days A FACE TO FACE EVALUATION WAS PERFORMED  P Jovian Lembcke 02/02/2020, 9:10 AM

## 2020-02-02 NOTE — Progress Notes (Signed)
Patient information reviewed and entered into [facility] System by Becky Verlon Carcione, PPS coordinator. Information including medical coding, function ability, and quality indicators will be reviewed and updated through discharge.   

## 2020-02-02 NOTE — Progress Notes (Addendum)
Physical Therapy Session Note  Patient Details  Name: Autumn Le MRN: [id number] Date of Birth: 02-11-1946  Today's Date: 02/02/2020 PT Individual Time: 1301-1357 PT Individual Time Calculation (min): 56 min    Short Term Goals: Week 1:  PT Short Term Goal 1 (Week 1): STG=LTG due to short ELOS.  Skilled Therapeutic Interventions/Progress Updates:    Patient supine in bed with HOB elevated, agreeable to therapy tx, reports 9/10 pain. Pt received pain medication ~1-2 hours prior to therapy, therapist offered intermittent rest breaks throughout session. Supine > sitting EOB mod-I with bed features. In sitting, pt began to donn pants without assist threading RLE through pant leg and asked for assist for LLE d/t L hip pain and LBP. Therapist donned B shoes maxA with pt activating B HS to push heel down into shoe. Sit > stand without AD with minA for safety. Pt ambulated ~150' to rehab gym without AD and minA for safety, pt performed furniture walking throughout hallways grabbing for rail intermittently and taking standing rest breaks.  Stand > sitting in chair with CGA. Sit > stand from chair without AD and CGA. Pt ascended/descended x 12 steps with CGA, performed alt pattern and utilizing B rails. Pt requires seated rest break with legs supported by step. Pt transported to ortho gym for energy conservation and for pain management. Pt performed car transfer with CGA for safety. Pt ambulated up/down ramp 10' with CGA-minA cues to dec use of railings. Stand > sit in w/c for rest break with CGA. Sit > stand with CGA and cues to steady self prior to initiating gait as transitional movements tend to inc pt's pain and can cause unsteadiness, pt then ambulated back to room ~75' without AD and with CGA-minA. Stand > sit in w/c with CGA. Pt continued to report high pain levels, despite intermittent rest breaks. Therapist communicated with RN for pain medication administration when appropriate. Pt left seated in  w/c with needs in reach and chair alarm set.   Therapy Documentation Precautions:  Precautions Precautions: Fall Precaution Comments: goal SBP<140 Restrictions Weight Bearing Restrictions: No   Therapy/Group: Individual Therapy  Blima Ledger SPT 02/02/2020, 1:29 PM

## 2020-02-02 NOTE — Care Management (Signed)
   Patient Details  Name: Autumn Le MRN: [id number] Date of Birth: [birth year]  Today's Date: 02/02/2020  Problem List:  Patient Active Problem List   Diagnosis Date Noted  . Ruptured cerebral aneurysm (HCC) 01/31/2020  . SAH (subarachnoid hemorrhage) (HCC) 01/23/2020   Past Medical History:  Past Medical History:  Diagnosis Date  . COPD (chronic obstructive pulmonary disease) (HCC)   . Degenerative joint disease of cervical spine   . Meniere's disease in remission, bilateral   . Osteopenia   . Vertigo    Past Surgical History:  Past Surgical History:  Procedure Laterality Date  . RADIOLOGY WITH ANESTHESIA N/A 01/23/2020   Procedure: RADIOLOGY WITH ANESTHESIA;  Surgeon: Lisbeth Renshaw, MD;  Location: [facility] OR;  Service: Radiology;  Laterality: N/A;  . TONSILLECTOMY     Social History:  reports that she has been smoking. She has never used smokeless tobacco. She reports previous alcohol use. She reports previous drug use.  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 wine  Patient / Family Perceptions, Expectations & Goals Pt/Family understanding of illness & functional limitations: Patient has a fair understanding of health status and functional limitations Premorbid pt/family roles/activities: Indpendent  living in senior living apartment Anticipated changes in roles/activities/participation: May require supervision at discharge Pt/family expectations/goals: Patient expects to be independent and return home to her apartment at discharge. Sister has offered her help in her home if needed  [employer] available at discharge: Sister will provide transportation as needed Resource referrals recommended: Neuropsychology  Discharge Planning Living Arrangements: Alone Support Systems: Other relatives, Friends/neighbors Type of Residence: Private residence Money Management: Patient Does the patient have any problems obtaining your medications?: No Home Management: Patient managed the home PTA Patient/Family Preliminary Plans: Patient plans to discharge to her home with family assisting as needed [place] Barriers to Discharge: Decreased caregiver support, Lack of/limited family support, Other (comments) Social Work Anticipated Follow Up Needs: HH/OP Expected length of stay: 7-10 days  Clinical Impression Patient was discontented with being hospitalized and having a ruptured aneurysm and frustrated with not being able to manage her care solo as she did pre-admission. Noted her sister was offering and would probably insist that she go to her home. However, her sister's husband has dementia requiring careand her niece said it may be too much on her mother (patient's sister)for the patient to go to her home. The patient reported she is having pain in her left hip and back since the fall. She doesn't know what her plan for discharge is at this moment but she will speak with her sister and discuss the options. She re-iterated that she wants to go home and may hire help but not certain yet. Just wanted everything to go back to the way it was.  Pamelia Hoit 02/02/2020, 5:43 PM

## 2020-02-02 NOTE — Progress Notes (Signed)
Speech Language Pathology Daily Session Note  Patient Details  Name: Autumn Le MRN: [id number] Date of Birth: 1946/09/25  Today's Date: 02/02/2020 SLP Individual Time: 0800-0830 SLP Individual Time Calculation (min): 30 min; missed 30 min  Short Term Goals: Week 1: SLP Short Term Goal 1 (Week 1): STG=LTG due to ELOS  Skilled Therapeutic Interventions:  Skilled treatment session targeted pt's cognition goals. SLP facilitiated session by attempting to administer the MOCA. Pt severely limited in participation by neck and back pain. MD in to assess and plans to add medicines to target pain. Pt With comments that she didn't care if she got initial items correct d/t severity of pain. Pt missed 30 minutes of skilled ST d/t severity of pain.      Pain Pain Assessment Pain Scale: 0-10 Pain Score: 7  Pain Type: Acute pain Pain Location: Back Pain Orientation: Lower Pain Descriptors / Indicators: Aching;Throbbing Pain Frequency: Constant Pain Onset: On-going Patients Stated Pain Goal: 3 Pain Intervention(s): Medication (See eMAR)  Therapy/Group: Individual Therapy  Autumn Le 02/02/2020, 8:59 AM

## 2020-02-03 ENCOUNTER — Inpatient Hospital Stay (HOSPITAL_COMMUNITY): Payer: Medicare HMO

## 2020-02-03 ENCOUNTER — Inpatient Hospital Stay (HOSPITAL_COMMUNITY): Payer: Medicare HMO | Admitting: Occupational Therapy

## 2020-02-03 ENCOUNTER — Inpatient Hospital Stay (HOSPITAL_COMMUNITY): Payer: Medicare HMO | Admitting: Physical Therapy

## 2020-02-03 ENCOUNTER — Inpatient Hospital Stay (HOSPITAL_COMMUNITY): Payer: Medicare HMO | Admitting: Speech Pathology

## 2020-02-03 MED ORDER — MUSCLE RUB 10-15 % EX CREA
TOPICAL_CREAM | Freq: Two times a day (BID) | CUTANEOUS | Status: DC | PRN
  Filled 2020-02-03: qty 85

## 2020-02-03 MED ORDER — TRAMADOL HCL 50 MG PO TABS
50.0000 mg | ORAL_TABLET | Freq: Four times a day (QID) | ORAL | Status: DC | PRN
  Administered 2020-02-03 – 2020-02-04 (×3): 50 mg via ORAL
  Filled 2020-02-03 (×3): qty 1

## 2020-02-03 MED ORDER — TROLAMINE SALICYLATE 10 % EX CREA
TOPICAL_CREAM | Freq: Two times a day (BID) | CUTANEOUS | Status: DC | PRN
  Filled 2020-02-03: qty 85

## 2020-02-03 MED ORDER — POTASSIUM CHLORIDE CRYS ER 20 MEQ PO TBCR
30.0000 meq | EXTENDED_RELEASE_TABLET | Freq: Every day | ORAL | Status: DC
  Administered 2020-02-03 – 2020-02-05 (×3): 30 meq via ORAL
  Filled 2020-02-03 (×3): qty 1

## 2020-02-03 NOTE — Progress Notes (Signed)
Physical Therapy Session Note  Patient Details  Name: Autumn Le MRN: [id number] Date of Birth: 1946/02/21  Today's Date: 02/03/2020     Short Term Goals: Week 1:  PT Short Term Goal 1 (Week 1): STG=LTG due to short ELOS.  Skilled Therapeutic Interventions/Progress Updates: Upon PTA arrival pt stating significant pain nausea 10/10. PTA discussed with nsg who advised received pain meds and will provide Zofran. Provided info to pt. PTA returned approx 20 min after pt received Zofran. Pt continue to refuse 2/2 pain and nausea. Discussed with pt progression and OOB tolerance for improved outcomes. Pt missed 75 min skilled PT.     Therapy Documentation Precautions:  Precautions Precautions: Fall Precaution Comments: goal SBP<140 Restrictions Weight Bearing Restrictions: No General:   Vital Signs:  Pain: Pain Assessment Pain Scale: 0-10 Pain Score: 10-Worst pain ever Faces Pain Scale: No hurt Pain Type: Chronic pain Pain Location: Back Pain Descriptors / Indicators: Aching Pain Onset: On-going Pain Intervention(s): Medication (See eMAR)  Therapy/Group: Individual Therapy  Vermon Grays  Caryle Helgeson, PTA  02/03/2020, 8:11 AM

## 2020-02-03 NOTE — IPOC Note (Addendum)
Overall Plan of Care [facility]) Patient Details Name: Autumn Le MRN: [id number] DOB: 05-Jan-1946  Admitting Diagnosis: see below  Hospital Problems: Active Problems:   Ruptured cerebral aneurysm [facility])     Functional Problem List: Nursing Bladder, Bowel, Nutrition, Pain, Safety  PT Balance, Behavior, Edema, Endurance, Motor, Nutrition, Pain, Perception, Safety, Sensory, Skin Integrity  OT Balance, Cognition, Endurance, Motor, Pain, Safety  SLP Cognition  TR         Basic ADL's: OT Grooming, Bathing, Dressing, Toileting     Advanced  ADL's: OT Simple Meal Preparation     Transfers: PT Bed Mobility, Bed to Chair, Car, Furniture, Floor  OT Toilet, Metallurgist: PT Ambulation, Emergency planning/management officer, Stairs     Additional Impairments: OT None  SLP Social Cognition   Memory, Awareness, Problem Solving, Attention  TR      Anticipated Outcomes Item Anticipated Outcome  Self Feeding Independent  Swallowing      Basic self-care  Mod I  Toileting  Mod I   Bathroom Transfers Mod I  Bowel/Bladder     Transfers  mod I  Locomotion  Mod I household distanced, supervision community distances >150 ft  Communication     Cognition  Supervision  Pain  pain goal less than or equal to 2  Safety/Judgment  minimal assist   Therapy Plan: PT Intensity: Minimum of 1-2 x/day ,45 to 90 minutes PT Frequency: 5 out of 7 days PT Duration Estimated Length of Stay: 7-10 days OT Intensity: Minimum of 1-2 x/day, 45 to 90 minutes OT Frequency: 5 out of 7 days OT Duration/Estimated Length of Stay: 7-10 days SLP Intensity: Minumum of 1-2 x/day, 30 to 90 minutes SLP Frequency: 3 to 5 out of 7 days SLP Duration/Estimated Length of Stay: 10 days   Due to the current state of emergency, patients may not be receiving their 3-hours of Medicare-mandated therapy.   Team Interventions: Nursing Interventions Patient/Family Education, Pain Management, Medication Management,  Discharge Planning, Psychosocial Support  PT interventions Ambulation/gait training, Community reintegration, DME/adaptive equipment instruction, Neuromuscular re-education, Psychosocial support, Stair training, UE/LE Strength taining/ROM, Wheelchair propulsion/positioning, Training and development officer, Discharge planning, Functional electrical stimulation, Pain management, Skin care/wound management, Therapeutic Activities, UE/LE Coordination activities, Cognitive remediation/compensation, Disease management/prevention, Functional mobility training, Patient/family education, Splinting/orthotics, Therapeutic Exercise, Visual/perceptual remediation/compensation  OT Interventions Training and development officer, Cognitive remediation/compensation, Discharge planning, Community reintegration, Disease mangement/prevention, DME/adaptive equipment instruction, Pain management, Patient/family education, Psychosocial support, Self Care/advanced ADL retraining, Therapeutic Activities, Therapeutic Exercise, UE/LE Strength taining/ROM  SLP Interventions Cognitive remediation/compensation, Cueing hierarchy, Patient/family education, Internal/external aids, Environmental controls  TR Interventions    SW/CM Interventions Discharge Planning, Disease Management/Prevention, Psychosocial Support, Patient/Family Education   Barriers to Discharge MD  Medical stability and pain  Nursing Decreased caregiver support, Medication compliance decreased safety awareness, lack of family support  PT Home environment access/layout, Medical stability Plans to stay with her sister at d/c in 1 level home with 2-3 STE without rails, low K+ on eval  OT Home environment access/layout 3 steps to enter at sister's house  SLP Decreased caregiver support    SW Decreased caregiver support, Lack of/limited family support, Other (comments)     Team Discharge Planning: Destination: PT-Home(Patient's sisters house) ,OT- Home ,  SLP-Home Projected Follow-up: PT-Home health PT, OT-  Home health OT, SLP-Home Health SLP, 24 hour supervision/assistance Projected Equipment Needs: PT-To be determined, OT- None recommended by OT, SLP-None recommended by SLP Equipment Details: PT-To be determined based on patient's progress, OT-has tub  transfer bench Patient/family involved in discharge planning: PT- Patient,  OT-Patient, SLP-Patient, Family member/caregiver  MD ELOS: 7-10d Medical Rehab Prognosis:  Excellent Assessment:  74 year old female with history ofCOPD, vertigo; who was admitted via [facility] on 01/23/2020 with mild headache, lightheadedness, no recall of past 48 hours and some upper neck pain with blood near her bed and abrasion on her elbow. CT head showedextensive SAH about right cerebral hemisphere and basal cisterns and was negative for skull fracture. CTA head/neck revealed 9 mm aneurysm of communicating segment of right ICA--likely culprit of hemorrhage and additional approximate 4 mm aneurysm right A2 ACA.CTA chest negative for PE.She was transferred to [facility] for work-up and she underwent cerebral angio with successful coil embolization of right PCA aneurysm. Also note made of approximately 3 mm right 8 2-3 aneurysm and approximately 7 mm cavernous right ICA aneurysm.Shewas placed on Nimotop supportive care and postprocedure had intermittent nausea as well as ongoing headaches.Fioricet and hydrocodoneadd for prn use but she continues to be limited by significant neck pain and HA. Methylprednisolone dose pack added today to help ameliorate symptoms. TCD without evidence of vasospasms and patien to complete 21 total days of nimotop. Therapy ongoing and patient limited by pain, cognitive deficits affecting safety/awareness, balance deficits as well as dizziness and nausea. CIR recommended due to functional decline.   Now requiring 24/7 Rehab RN,MD, as well as CIR level PT, OT and SLP.  Treatment  team will focus on ADLs and mobility with goals set at [facility] I/S See Team Conference Notes for weekly updates to the plan of care

## 2020-02-03 NOTE — Progress Notes (Signed)
Speech Language Pathology Daily Session Note  Patient Details  Name: Autumn Le MRN: [id number] Date of Birth: 1945-12-15  Today's Date: 02/03/2020 SLP Individual Time: 4114-6431 SLP Individual Time Calculation (min): 41 min  Short Term Goals: Week 1: SLP Short Term Goal 1 (Week 1): STG=LTG due to ELOS  Skilled Therapeutic Interventions:  Skilled treatment session targeted pt's cognition goals. SLP facilitated session by administering the [facility] version 7.1. Pt obtained score of 19 out of 30 (n>=26). Pt frequently requires more than a reasonable amount of time to complete tasks d/t self-reported neck and back pain. She requires extensive amounts of time to take medication as well as cognitive tasks. Pt was responsive to time constraints to produce answer or complete task. Results reviewed with pt. Pt left in bed, bed alarm on and all needs within reach.      Pain    Therapy/Group: Individual Therapy  Danica Camarena 02/03/2020, 11:51 AM

## 2020-02-03 NOTE — Progress Notes (Signed)
Occupational Therapy Session Note  Patient Details  Name: Autumn Le MRN: [id number] Date of Birth: [birth year]  Today's Date: 02/03/2020 OT Individual Time: 1105-1200 OT Individual Time Calculation (min): 55 min    Short Term Goals: Week 1:  OT Short Term Goal 1 (Week 1): STG = LTGs due to ELOS  Skilled Therapeutic Interventions/Progress Updates:      Pt seen for BADL retraining of toileting, bathing, and dressing with a focus on safe functional mobility and balance.  Pt received sitting on EOB expressing her frustration with the hospital room, her pain and that she needs to be here. Distracted pt from her current situation by discussing her family.  Pt did agree to shower today. Using RW she ambulated with CGA to min A from bed to toilet to shower to recliner.  With standing and ambulation, she demonstrated LLE weakness with occasional knee buckling.  Therefore, she needs CGA when standing to wash bottom and pull pants over hips.   She was in quite a bit of pain during session.  In recliner, pt set up with kpad behind back. Chair alarm on and all needs met.   Therapy Documentation Precautions:  Precautions Precautions: Fall Precaution Comments: goal SBP<140 Restrictions Weight Bearing Restrictions: No  Pain: Pain Assessment Pain Scale: 0-10 Pain Score: 10-Worst pain ever Faces Pain Scale: No hurt Pain Type: Chronic pain Pain Location: Back Pain Descriptors / Indicators: Aching Pain Onset: On-going Pain Intervention(s): Medication (See eMAR)   ADL: ADL Eating: Set up Where Assessed-Eating: Bed level Grooming: Setup Where Assessed-Grooming: Edge of bed Toileting: Minimal assistance Where Assessed-Toileting: Bedside Commode Toilet Transfer: Minimal assistance Toilet Transfer Method: Stand pivot Toilet Transfer Equipment: Bedside commode ADL Comments: Limited activity on eval due to easily frustrated with direction, as well as limited to bedside therapy due to low  K+   Therapy/Group: Individual Therapy  Robin[place] 02/03/2020, 8:52 AM

## 2020-02-03 NOTE — Progress Notes (Signed)
[facility] PHYSICAL MEDICINE & REHABILITATION PROGRESS NOTE   Subjective/Complaints:   No issues overnite except back pain rather than neck pain, sleeping with HOB elevated Some pain radiating into thigh but not below the knee   ROS neg CP, SOB, N/V/D Objective:   No results found. Recent Labs    02/01/20 0541  WBC 13.0*  HGB 10.6*  HCT 31.4*  PLT 320   Recent Labs    02/01/20 0541 02/01/20 1650  NA 138 136  K 2.3* 3.6  CL 104 103  CO2 25 24  GLUCOSE 107* 116*  BUN 6* 6*  CREATININE 0.53 0.51  CALCIUM 8.3* 8.5*    Intake/Output Summary (Last 24 hours) at 02/03/2020 0700 Last data filed at 02/02/2020 2026 Gross per 24 hour  Intake 600 ml  Output --  Net 600 ml     Physical Exam: Vital Signs Blood pressure (!) 141/70, pulse 71, temperature 98.9 F (37.2 C), temperature source Oral, resp. rate 16, height 5\' 4"  (1.626 m), weight 56.4 kg, SpO2 95 %.  General: No acute distress Mood and affect are appropriate Heart: Regular rate and rhythm no rubs murmurs or extra sounds Lungs: Clear to auscultation, breathing unlabored, no rales or wheezes Abdomen: Positive bowel sounds, soft nontender to palpation, nondistended Extremities: No clubbing, cyanosis, or edema Skin: No evidence of breakdown, no evidence of rash Neurologic: Cranial nerves II through XII intact, motor strength is 5/5 in bilateral deltoid, bicep, tricep, grip, hip flexor, knee extensors, ankle dorsiflexor and plantar flexor  Musculoskeletal: Lumbar paraspinal tenderness L>R around L3-4  Assessment/Plan: 1. Functional deficits secondary to [facility]  which require 3+ hours per day of interdisciplinary therapy in a comprehensive inpatient rehab setting.  Physiatrist is providing close team supervision and 24 hour management of active medical problems listed below.  Physiatrist and rehab team continue to assess barriers to discharge/monitor patient progress toward functional and medical goals  Care  Tool:  Bathing  Bathing activity did not occur: Refused           Bathing assist       Upper Body Dressing/Undressing Upper body dressing Upper body dressing/undressing activity did not occur (including orthotics): Refused What is the patient wearing?: Hospital gown only    Upper body assist Assist Level: Minimal Assistance - Patient > 75%    Lower Body Dressing/Undressing Lower body dressing    Lower body dressing activity did not occur: Refused What is the patient wearing?: Underwear/pull up     Lower body assist Assist for lower body dressing: Supervision/Verbal cueing Assistive Device Comment: walker   Toileting Toileting    Toileting assist Assist for toileting: Supervision/Verbal cueing     Transfers Chair/bed transfer  Transfers assist     Chair/bed transfer assist level: Contact Guard/Touching assist     Locomotion Ambulation   Ambulation assist   Ambulation activity did not occur: Safety/medical concerns(pt with low K+ on eval, limited to bedside and transfers in the room per MD)  Assist level: Minimal Assistance - Patient > 75% Assistive device: No Device Max distance: 150'   Walk 10 feet activity   Assist  Walk 10 feet activity did not occur: Safety/medical concerns  Assist level: Minimal Assistance - Patient > 75% Assistive device: No Device   Walk 50 feet activity   Assist Walk 50 feet with 2 turns activity did not occur: Safety/medical concerns  Assist level: Minimal Assistance - Patient > 75% Assistive device: No Device    Walk 150 feet activity  Assist Walk 150 feet activity did not occur: Safety/medical concerns  Assist level: Minimal Assistance - Patient > 75% Assistive device: No Device    Walk 10 feet on uneven surface  activity   Assist Walk 10 feet on uneven surfaces activity did not occur: Safety/medical concerns(pt with low K+ on eval, limited to bedside and transfers in the room per MD)   Assist level:  Minimal Assistance - Patient > 75% Assistive device: Other (comment)(none)   Wheelchair     Assist     Wheelchair activity did not occur: Safety/medical concerns(pt with low K+ on eval, limited to bedside and transfers in the room per MD)         Wheelchair 50 feet with 2 turns activity    Assist    Wheelchair 50 feet with 2 turns activity did not occur: Safety/medical concerns       Wheelchair 150 feet activity     Assist  Wheelchair 150 feet activity did not occur: Safety/medical concerns       Blood pressure (!) 141/70, pulse 71, temperature 98.9 F (37.2 C), temperature source Oral, resp. rate 16, height 5\' 4"  (1.626 m), weight 56.4 kg, SpO2 95 %.  Medical Problem List and Plan: 1. Decline in cognition and mobility secondary to Right SAH -patient may  shower. -ELOS/Goals: 7-10d  -Continue CIR PT, OT, SLP- team conf in am  2. Antithrombotics: -DVT/anticoagulation:Mechanical:Sequential compression devices, below kneeBilateral lower extremities -antiplatelet therapy: N/a 3.Headaches/Pain Management:Will add Topamax and titrate slowly. Will add K pad for shoulder pain and continue robaxin prnfor shoulder pain. D/C ibuprofen. Will change hydrocodone to oxycodone and monitor.  3/22: Pain from cervical osteophytes at C5-C6 and C6-C7 8/10 to patient and limiting her participation in therapy. She has been using Kpads. Add diclofenac gel and schedule tylenol.  3/23 pain in low back not neck today , discussed sleeping position, avoid side lying with HOB elevated, add sportscreme, trial tramadol 4. Mood:LCSW to follow for evaluation and support. -antipsychotic agents: N/A 5. Neuropsych: This patientiscapable of making decisions on herown behalf. 6. Skin/Wound Care:Routine pressure relief measures. 7. Fluids/Electrolytes/Nutrition:Monitor I/O.Check labs in am. 8. H/o Meniere's disease:In  remission. 9. COPD:Continue Breo. Will resume claritin. 10. Nausea/dizziness: Vestibular evaluation/tx to help manage symptoms.  Will also order orthostatic BP.May need to add scopolamine patch.  11. HTN: Monitor BP tid--goal <140. On Nimotop started on 3/14-->end date 4/4..  3/22: Has been consistently elevated, could be pain related. Add amlodipine 5mg .  12. Leucocytosis: Monitor for signs of infection and trend out--may rise with addition of steroids.  13.  Hypo K+, likely nutritional will do K rider x 6 today , repeat BMET in 6 hours, add po KCL after IV  3/21: K+ much improved to 3.6.  Will start KCL daily recheck BMET 3/24  LOS: 3 days A FACE TO FACE EVALUATION WAS PERFORMED  02/03/2020, 7:00 AM

## 2020-02-03 NOTE — Progress Notes (Signed)
Physical Therapy Session Note  Patient Details  Name: Autumn Le MRN: [id number] Date of Birth: 1946-03-20  Today's Date: 02/03/2020 PT Individual Time: 1417-1502 PT Individual Time Calculation (min): 45 min   Short Term Goals: Week 1:  PT Short Term Goal 1 (Week 1): STG=LTG due to short ELOS.  Skilled Therapeutic Interventions/Progress Updates:   Pt received in supine and agreeable to therapy. Reports mild headache and sharp pain in bilateral lower back L>R. PT provides education, mobilization and soft-tissue stretching to help manage pain symptoms.   Supine>sit with supervision. CGA for sit to stand transfer with increased time required secondary to back pain. Ambulation in hallways ~500' without AD. Pt primarily ambulating with CGA but occasionally requiring minA at hips due to buckling in LLE due to pt report of shooting back and LLE pain.   Pt performs TUG with score of 36.89 seconds and requiring minA for safe balance.  PT provides supine soft-tissue stretching to bilateral hamstrings and glutes. Pt does not report exacerbation or improvement of pain symptoms.  Pt left supine in bed with all need within reach and alarm intact.  Therapy Documentation Precautions:  Precautions Precautions: Fall Precaution Comments: goal SBP<140 Restrictions Weight Bearing Restrictions: No      Therapy/Group: Individual Therapy  Beau Fanny, PT, DPT 02/03/2020, 4:10 PM

## 2020-02-03 NOTE — Plan of Care (Signed)
  Problem: Consults Goal: RH GENERAL PATIENT EDUCATION Description: See Patient Education module for education specifics. Outcome: Progressing   Problem: RH BOWEL ELIMINATION Goal: RH STG MANAGE BOWEL WITH ASSISTANCE Description: STG Manage Bowel with minimal assistance. Outcome: Progressing   Problem: RH BLADDER ELIMINATION Goal: RH STG MANAGE BLADDER WITH ASSISTANCE Description: STG Manage Bladder With minimal Assistance Outcome: Progressing   Problem: RH SKIN INTEGRITY Goal: RH STG SKIN FREE OF INFECTION/BREAKDOWN Outcome: Progressing Goal: RH STG MAINTAIN SKIN INTEGRITY WITH ASSISTANCE Description: STG Maintain Skin Integrity With mod I Assistance. Outcome: Progressing   

## 2020-02-04 ENCOUNTER — Encounter (HOSPITAL_COMMUNITY): Payer: Medicare HMO | Admitting: Psychology

## 2020-02-04 ENCOUNTER — Inpatient Hospital Stay (HOSPITAL_COMMUNITY): Payer: Medicare HMO

## 2020-02-04 ENCOUNTER — Inpatient Hospital Stay (HOSPITAL_COMMUNITY): Payer: Medicare HMO | Admitting: Speech Pathology

## 2020-02-04 ENCOUNTER — Inpatient Hospital Stay (HOSPITAL_COMMUNITY): Payer: Medicare HMO | Admitting: Occupational Therapy

## 2020-02-04 DIAGNOSIS — I609 Nontraumatic subarachnoid hemorrhage, unspecified: Secondary | ICD-10-CM

## 2020-02-04 DIAGNOSIS — R451 Restlessness and agitation: Secondary | ICD-10-CM

## 2020-02-04 LAB — BASIC METABOLIC PANEL
Anion gap: 9 (ref 5–15)
BUN: 5 mg/dL — ABNORMAL LOW (ref 8–23)
CO2: 21 mmol/L — ABNORMAL LOW (ref 22–32)
Calcium: 8.8 mg/dL — ABNORMAL LOW (ref 8.9–10.3)
Chloride: 107 mmol/L (ref 98–111)
Creatinine, Ser: 0.52 mg/dL (ref 0.44–1.00)
GFR calc Af Amer: >60 mL/min (ref >60)
GFR calc non Af Amer: >60 mL/min (ref >60)
Glucose, Bld: 103 mg/dL — ABNORMAL HIGH (ref 70–99)
Potassium: 3.3 mmol/L — ABNORMAL LOW (ref 3.5–5.1)
Sodium: 137 mmol/L (ref 135–145)

## 2020-02-04 MED ORDER — MAGNESIUM HYDROXIDE 400 MG/5ML PO SUSP
30.0000 mL | Freq: Every day | ORAL | Status: DC | PRN
  Administered 2020-02-04: 21:00:00 30 mL via ORAL
  Filled 2020-02-04: qty 30

## 2020-02-04 MED ORDER — POLYETHYLENE GLYCOL 3350 17 G PO PACK
17.0000 g | PACK | Freq: Two times a day (BID) | ORAL | Status: DC
  Administered 2020-02-04 – 2020-02-12 (×10): 17 g via ORAL
  Filled 2020-02-04 (×12): qty 1

## 2020-02-04 MED ORDER — MUSCLE RUB 10-15 % EX CREA: 1.0000 "application " | TOPICAL_CREAM | Freq: Two times a day (BID) | CUTANEOUS | 0 refills | Status: DC | PRN

## 2020-02-04 NOTE — Progress Notes (Signed)
Patient c/o lower back pain and headache last night. HS medication was given and also nurse offered Dulcolax suppository for constipation. Patient stated "I never had that before". Patient requested if possible PO medication for constipation and wants to take it in the morning d/t not having a good sleep the previous night. This rotation, patient slept good and reported mild pain in lower back and headache. No PRN pain medication was given. Lajoyce Corners Ryshawn Sanzone, LPN

## 2020-02-04 NOTE — Consult Note (Signed)
Neuropsychological Consultation   Patient:   Autumn Le   DOB:   06/13/1946  MR Number:  [id number]  Location:  [facility] [address] CENTER [id number] [place] [address] Dept: [phone] Loc: [phone]           Date of Service:   02/04/2020  Start Time:   1 PM End Time:   2 PM  Provider/Observer:  Arley Phenix, Psy.D.       Clinical Neuropsychologist       Billing Code/Service: 18299  Chief Complaint:    Autumn Le. Aldea is a 74 year old female with history of COPD, vertigo.  The patient was admitted to [facility] on 01/23/2020 with mild headache, lightheadedness and no recall for events of the prior 48 hours.  There was clear both retrograde and anterograde amnesia.  The patient reported some upper neck pain with blood near her bed and abrasion on her elbow.  CT head showed extensive subarachnoid hemorrhage about right cerebral hemisphere and basal cisterns and was negative for skull fracture.  CTA head/neck revealed 9 mm aneurysm of communicating segment of the right ICA which was the likely culprit of hemorrhage and additional approximate 4 mm aneurysm right A2 segment of the ACA.  CTA chest negative for PE.  The patient was transferred to [facility] for work-up and underwent cerebral angio with successful coiling embolization of right PCA aneurysm.  There were also a 3 mm right aneurysm and approximately 7 mm cavernous right ICA aneurysm.  The patient was referred to CIR due to functional decline including dizziness, balance issues, reduced safety awareness and cognitive deficits as well as significant pain.  The patient continues to have some reduction in cognitive efficiency as well as being agitated and difficulty with coping with agitation and current pain levels.  Reason for Service:  The patient was referred for neuropsychological consultation due to adjustment coping  issues and ongoing cognitive deficits.  Below see HPI for the current mission.  BZJ:IRCVEL Autumn Le is a 74 year old female with history ofCOPD, vertigo; who was admitted via [facility] on 01/23/2020 with mild headache, lightheadedness, no recall of past 48 hours and some upper neck pain with blood near her bed and abrasion on her elbow. CT head showedextensive SAH about right cerebral hemisphere and basal cisterns and was negative for skull fracture. CTA head/neck revealed 9 mm aneurysm of communicating segment of right ICA--likely culprit of hemorrhage and additional approximate 4 mm aneurysm right A2 ACA.CTA chest negative for PE.She was transferred to [facility] for work-up and she underwent cerebral angio with successful coil embolization of right PCA aneurysm. Also note made of approximately 3 mm right 8 2-3 aneurysm and approximately 7 mm cavernous right ICA aneurysm.Shewas placed on Nimotop supportive care and postprocedure had intermittent nausea as well as ongoing headaches.Fioricet and hydrocodoneadd for prn use but she continues to be limited by significant neck pain and HA. Methylprednisolone dose pack added today to help ameliorate symptoms. TCD without evidence of vasospasms and patien to complete 21 total days of nimotop. Therapy ongoing and patient limited by pain, cognitive deficits affecting safety/awareness, balance deficits as well as dizziness and nausea. CIR recommended due to functional decline.  Current Status:  The patient was alert but agitated at times with reduction in attention and concentration variables.  She was oriented but focused on issues and concerns about her pain and displayed issues of anxiety and  worry that she may have more injury to her neck or back as she is experiencing ongoing pain.  Reviewed issues about need for careful addressing pain management due to recent medical events.  She had lots of complaints about various aspects that were in fact  directly related to her fall and injury rather than what she was making attribution to.  The patient is expecting to be going home this weekend and will be going to live with her sister,  who is also taking care of her husband with Alzheimer's.  The sister was rather stressed about the degree of health that the patient will need and how long it will be before the patient is able to get herself in and out of bed or chair.  Agitation and drive to control in the setting of confusion and cognitive deficits noted.     Behavioral Observation: Autumn Le  presents as a 74 y.o.-year-old Right Caucasian Female who appeared her stated age. her dress was Appropriate and she was Well Groomed and her manners were Appropriate to the situation.  her participation was indicative of Inattentive and Resistant behaviors.  There were any physical disabilities noted.  she displayed an inappropriate level of cooperation and motivation.     Interactions:    Active Inattentive and Resistant  Attention:   abnormal and attention span appeared shorter than expected for age  Memory:   abnormal; global memory impairment noted  Visuo-spatial:  not examined  Speech (Volume):  low  Speech:   normal; normal  Thought Process:  Circumstantial  Though Content:  Rumination; not suicidal and not homicidal  Orientation:   person, place and time/date  Judgment:   Poor  Planning:   Poor  Affect:    Anxious, Defensive and Irritable  Mood:    Dysphoric  Insight:   Shallow  Intelligence:   normal  Medical History:   Past Medical History:  Diagnosis Date  . COPD (chronic obstructive pulmonary disease) ([place])   . Degenerative joint disease of cervical spine   . Meniere's disease in remission, bilateral   . Osteopenia   . Vertigo    Psychiatric History:  No prior psychiatric history  Family Med/Psych History:  Family History  Problem Relation Age of Onset  . Breast cancer Maternal Aunt      Impression/DX:  Autumn Le. Autumn Le is a 74 year old female with history of COPD, vertigo.  The patient was admitted to [facility] on 01/23/2020 with mild headache, lightheadedness and no recall for events of the prior 48 hours.  There was clear both retrograde and anterograde amnesia.  The patient reported some upper neck pain with blood near her bed and abrasion on her elbow.  CT head showed extensive subarachnoid hemorrhage about right cerebral hemisphere and basal cisterns and was negative for skull fracture.  CTA head/neck revealed 9 mm aneurysm of communicating segment of the right ICA which was the likely culprit of hemorrhage and additional approximate 4 mm aneurysm right A2 segment of the ACA.  CTA chest negative for PE.  The patient was transferred to [facility] for work-up and underwent cerebral angio with successful coiling embolization of right PCA aneurysm.  There were also a 3 mm right aneurysm and approximately 7 mm cavernous right ICA aneurysm.  The patient was referred to CIR due to functional decline including dizziness, balance issues, reduced safety awareness and cognitive deficits as well as significant pain.  The patient continues to have some reduction in cognitive  efficiency as well as being agitated and difficulty with coping with agitation and current pain levels.  The patient was alert but agitated at times with reduction in attention and concentration variables.  She was oriented but focused on issues and concerns about her pain and displayed issues of anxiety and worry that she may have more injury to her neck or back as she is experiencing ongoing pain.  Reviewed issues about need for careful addressing pain management due to recent medical events.  She had lots of complaints about various aspects that were in fact directly related to her fall and injury rather than what she was making attribution to.  The patient is expecting to be going home this weekend and  will be going to live with her sister,  who is also taking care of her husband with Alzheimer's.  The sister was rather stressed about the degree of health that the patient will need and how long it will be before the patient is able to get herself in and out of bed or chair.  Agitation and drive to control in the setting of confusion and cognitive deficits noted.    Disposition/Plan:  The patient will be discharged soon but will likely need continued assistance and support.    Diagnosis:    Ruptured cerebral aneurysm (HCC) - Plan: Ambulatory referral to Physical Medicine Rehab  Sacral back pain - Plan: DG Lumbar Spine 2-3 Views, DG Lumbar Spine 2-3 Views         Electronically Signed   _______________________ Arley Phenix, Psy.D.

## 2020-02-04 NOTE — Progress Notes (Signed)
Occupational Therapy Session Note  Patient Details  Name: Autumn Le MRN: [id number] Date of Birth: November 18, 1945  Today's Date: 02/04/2020 OT Individual Time: 1140-1210 OT Individual Time Calculation (min): 30 min    Short Term Goals: Week 1:  OT Short Term Goal 1 (Week 1): STG = LTGs due to ELOS  Skilled Therapeutic Interventions/Progress Updates:    Pt received in bed agreeable to therapy. Pt sat to EOB but got herself distracted spending a great deal of time complaining about the hospital room set up and the bed design.  It took several tries, to have pt refocus.  She was then able to stand up and ambulate to bathroom with RW with S, toileting and changed clothing with S.  Stood at sink to brush teeth independently.  Ambulated back to bed S.  No L knee buckling today. No LOB.  From EOB worked on LE AROM exercises with feet on separate wash clothes for 20 reps each leg of "cross country skiing" and 20 reps of in and out hip abd/add then 20 heel raises calf strengthening. Rested briefly and repeated all exercises.  Pt opted to lay down to wait for lunch. Bed alarm set and all needs met.   Therapy Documentation Precautions:  Precautions Precautions: Fall Precaution Comments: goal SBP<140 Restrictions Weight Bearing Restrictions: No    Vital Signs: Therapy Vitals BP: (!) 113/48 Patient Position (if appropriate): Lying Pain: Pain Assessment Pain Scale: 0-10 Pain Score: 6  Faces Pain Scale: No hurt Pain Type: Acute pain Pain Location: Head Pain Intervention(s): Medication (See eMAR);Repositioned     Therapy/Group: Individual Therapy  Akon Reinoso 02/04/2020, 12:59 PM

## 2020-02-04 NOTE — Progress Notes (Signed)
Patient advised of LS spine results. Spoke to Dr. Darrick Penna about infrarenal dilatation and he felt that CTA not needed-- recommended aortic ultrasound for work up. He will look at results and follow up with patient on Friday.

## 2020-02-04 NOTE — Plan of Care (Signed)
  Problem: Consults Goal: RH GENERAL PATIENT EDUCATION Description: See Patient Education module for education specifics. Outcome: Progressing   Problem: RH BOWEL ELIMINATION Goal: RH STG MANAGE BOWEL WITH ASSISTANCE Description: STG Manage Bowel with minimal assistance. Outcome: Progressing Flowsheets (Taken 02/04/2020 1449) STG: Pt will manage bowels with assistance: 5-Supervision/curing   Problem: RH BLADDER ELIMINATION Goal: RH STG MANAGE BLADDER WITH ASSISTANCE Description: STG Manage Bladder With minimal Assistance Outcome: Progressing Flowsheets (Taken 02/04/2020 1449) STG: Pt will manage bladder with assistance: 5-Supervision/cueing   Problem: RH SKIN INTEGRITY Goal: RH STG SKIN FREE OF INFECTION/BREAKDOWN Outcome: Progressing Goal: RH STG MAINTAIN SKIN INTEGRITY WITH ASSISTANCE Description: STG Maintain Skin Integrity With mod I Assistance. Outcome: Progressing Flowsheets (Taken 02/04/2020 1449) STG: Maintain skin integrity with assistance: 5-Supervision/cueing

## 2020-02-04 NOTE — Progress Notes (Signed)
Physical Therapy Session Note  Patient Details  Name: Autumn Le MRN: [id number] Date of Birth: Feb 03, 1946  Today's Date: 02/04/2020 PT Individual Time: 1446-1530 PT Individual Time Calculation (min): 44 min    Short Term Goals: Week 1:  PT Short Term Goal 1 (Week 1): STG=LTG due to short ELOS.  Skilled Therapeutic Interventions/Progress Updates:    Patient supine in bed upon PT arrival, agreeable to therapy tx, reports HA, LBP and L hip pain. Therapist encouraged to participate in session, pt agreed to bed level exercises with rest breaks d/t inc pain. Pt expressed feeling like discharging this weekend is too soon and was nervous about it and concerned about it being too much of a burden on her sister - therapist provided emotional support. Pt performed the following exercises in bed:  - SAQ 3s hold x 15 each LE  - hip adduction/abduction x 15 each LE - heel slides x 15 each LE  - knee fall outs x 15 each LE  Pt expressed wanting to try to sit up at the edge of bed. In hooklying, pt scooting up to [facility] utilizing BUE and bed features and pushing up through BLE with modA from therapist and cues for positioning. Supine > sidelying R > sitting EOB  with minA for trunk upright, cues for sequencing and additional time required. Pt reported pain inc while sitting EOB and asked to check if it was time to take pain medication again. Therapist communicated with RN about administering pain medication if appropriate. Sitting EOB > supine with minA for LE management. Pt positioned comfortably, left supine in bed with needs in reach and bed alarm set.   Therapy Documentation Precautions:  Precautions Precautions: Fall Precaution Comments: goal SBP<140 Restrictions Weight Bearing Restrictions: No   Therapy/Group: Individual Therapy  Blima Ledger SPT 02/04/2020, 7:54 AM

## 2020-02-04 NOTE — Progress Notes (Signed)
Speech Language Pathology Daily Session Note  Patient Details  Name: Autumn Le MRN: [id number] Date of Birth: Apr 09, 1946  Today's Date: 02/04/2020 SLP Individual Time: 1030-1038 SLP Individual Time Calculation (min): 8 min  Short Term Goals: Week 1: SLP Short Term Goal 1 (Week 1): STG=LTG due to ELOS  Skilled Therapeutic Interventions:  Skilled treatment session targeted pt's cognition goals. SLP received pt in bed. Pt refused ST session - per her she "wasn't going to work on therapy tasks, if anything I want to fix this damn bed." Education provided on need for cognitive therapy. Pt continued refusal. Team made aware.      Pain Pain Assessment Pain Scale: 0-10 Pain Score: 6  Pain Type: Acute pain Pain Location: Head Pain Intervention(s): Medication (See eMAR);Repositioned  Therapy/Group: Individual Therapy  Jamese Trauger 02/04/2020, 11:32 AM

## 2020-02-04 NOTE — Patient Care Conference (Signed)
Inpatient RehabilitationTeam Conference and Plan of Care Update Date: 02/04/2020   Time: 11:10 AM    Patient Name: Autumn Le      Medical Record Number: [id number]  Date of Birth: 02-07-1946 Sex: Female         Room/Bed: 4M03C/4M03C-01 Payor Info: Payor: [employer] MEDICARE / Plan: [employer] / Product Type: *No Product type* /    Admit Date/Time:  01/31/2020  4:03 PM  Primary Diagnosis:  Ruptured cerebral aneurysm [facility])  Patient Active Problem List   Diagnosis Date Noted  . Agitation   . Ruptured cerebral aneurysm ([place]) 01/31/2020  . SAH (subarachnoid hemorrhage) ([place]) 01/23/2020    Expected Discharge Date: Expected Discharge Date: 02/06/20  Team Members Present: Physician leading conference: Dr. Delice Lesch Care Coodinator Present: Karene Fry, RN, MSN;Deborah Tobin Chad, RN, BSN, CRRN Nurse Present: Blair Heys, RN PT Present: Michaelene Song, PT OT Present: Meriel Pica, OT SLP Present: Stormy Fabian, SLP PPS Coordinator present : Ileana Ladd, Burna Mortimer, SLP     Current Status/Progress Goal Weekly Team Focus  Bowel/Bladder   Patient is continent of bowel and bladder. LBM: 01/31/20  maintain continence and resolve constipation issues in which patient will go back to have regular bowel movement  q shift and PRN toileting assistance, administer PRN stool softener and laxatives   Swallow/Nutrition/ Hydration             ADL's   Supervision overall with self care  Mod I with self care tasks except S with tub transfer; S with homemaking; min A with emergent awareness; mod I dynamic balance and sit to stand  ADL training, dynamic balance, functional mobility, cognition, pt education   Mobility   Supervision for bed mobility. CGA for transfers. MinA ambulation >500' without AD. Several LOBs due to back/LLE pain.  Independent bed mobility and transfers with LRAD. Supervision gait 150' LRAD. CGA 3 steps without hand rails.  Acitivity tolerance, gait with rolling  walker and safe management of RW, stair training. Dynamic balance with cognitive challenges.   Communication             Safety/Cognition/ Behavioral Observations  Mod A for selective attention, safety awareness, semi-complex problem solving  Supervision level - may need to downgrade if ELOS is short  19 out of 30 on MOCA, very distracted by pain   Pain   Patient c/o moderate lower back pain and constant headache  pain level <4  q shift and PRN pain assessment, administer PRN medication   Skin   patient has OTA abrasion in left elbow  prevent skin breakdown and infection  q shift and PRN skin assessment    Rehab Goals Patient on target to meet rehab goals: Yes *See Care Plan and progress notes for long and short-term goals.     Barriers to Discharge  Current Status/Progress Possible Resolutions Date Resolved   Nursing                  PT  Home environment access/layout;Medical stability  Plans to stay with her sister at d/c in 1 level home with 2-3 STE without rails, low K+ on eval              OT Home environment access/layout  3 steps to enter at sister's house             SLP Decreased caregiver support              SW Decreased caregiver support;Lack of/limited  family support;Other (comments) Sister offered her home, however niece reported this is too much for her mother as sister's husband has dementia Patient is thinking about help after discharge and destination; would like to go back to her home          Discharge Planning/Teaching Needs:  Home alone with hired help if needed  TBD   Team Discussion: MD back xray done, hypokalemia, repleated, monitoring BP.  RN headache, miralax given, cont.  OT S overall, shower S, in ALF, wants to go home.  PT S bed, CGA transfers, S amb walker, min A no device.  SLP refused therapy today.  Lives alone, has a sister.   Revisions to Treatment Plan: N/A     Medical Summary Current Status: Decline in cognition and mobility secondary to  Right SAH Weekly Focus/Goal: Improve mobility, headaches, K+, BP, back pain  Barriers to Discharge: Medical stability   Possible Resolutions to Barriers: Therapies, supplement K+, optimize BP meds, follow xrays   Continued Need for Acute Rehabilitation Level of Care: The patient requires daily medical management by a physician with specialized training in physical medicine and rehabilitation for the following reasons: Direction of a multidisciplinary physical rehabilitation program to maximize functional independence : Yes Medical management of patient stability for increased activity during participation in an intensive rehabilitation regime.: Yes Analysis of laboratory values and/or radiology reports with any subsequent need for medication adjustment and/or medical intervention. : Yes   I attest that I was present, lead the team conference, and concur with the assessment and plan of the team.   Trish Mage 02/04/2020, 8:47 PM   Team conference was held via web/ teleconference due to COVID - 19

## 2020-02-04 NOTE — Progress Notes (Signed)
Physical Therapy Session Note  Patient Details  Name: Autumn Le MRN: [id number] Date of Birth: Sep 24, 1946  Today's Date: 02/04/2020 PT Individual Time: 0915-1030 PT Individual Time Calculation (min): 75 min   Short Term Goals: Week 1:  PT Short Term Goal 1 (Week 1): STG=LTG due to short ELOS. Week 2:    Week 3:     Skilled Therapeutic Interventions/Progress Updates:    PAIN Initially 10/10 described as excrucitation into bilat upper thighs.  Pt initially OOB in recliner and agreeable to treatment session with focus on  Pain management and functional mobility ] STS from recliner w/cga, slowly, apparently painful.  Pt pauses for several moments to allow pain to subside.  Gait 115ft w/RW w/supervision, fair cadence, transferrs to edge of mat w/supervision w/RW.  Increased pain w/transition.  Sit to stand w/cga. Noted to have symmetrical alignment at hips and shoulders.  Standing forward flexion x 2 w/increased c/o buttock pain bilat. Stand to sit w/cga.  Sit to supine w/supervision, additional time due to pain.  In supine performed the following to address back pain: Pt educated re pain/spasm cycle and purpose of stretching/gentle ROM exercises to attempt to decrease pain/spasm  Supine piriformis stretch x 45 each side x 2  Supine hs stretch x 45 each x 2 Single knee to chest x 4 Lower trunk rotation x 5 Pt moves very slowly and w/much time needed between efforts due to pain.  Supine to sit w/mod assist due to pain. Seated piriformis stretch x 1 each, 45 sec  Seated trunk flexion stretch x45  STS w/rw w/supervision, slow to come to erect posture. Gait 168ft w/rw w/supervision, decreased cadence from beginning of session.  Turn/sit to edge of bed w/supervision.  Pt stating she did not unerstand bed controls, instructed pt w/operation of bed controls.  HOB elevated 40*, pt sit to supine w/min assist for LE management due to pain.  Positioned Kpad under pt for heat/pain  relief.  Pt left supine w/rails up x 3, alarm set, bed in lowest position, and needs in reach.  Primarily limited by pain.  No relief achieved w/stretching/ROM.  Therapy Documentation Precautions:  Precautions Precautions: Fall Precaution Comments: goal SBP<140 Restrictions Weight Bearing Restrictions: No   Therapy/Group: Individual Therapy  Rada Hay, PT   [name] 02/04/2020, 4:36 PM

## 2020-02-04 NOTE — Plan of Care (Signed)
  Problem: RH Problem Solving Goal: LTG Patient will demonstrate problem solving for (SLP) Description: LTG:  Patient will demonstrate problem solving for basic/complex daily situations with cues  (SLP) Flowsheets (Taken 02/04/2020 1121) LTG: Patient will demonstrate problem solving for (SLP): (semi-complex) Other (comment) LTG Patient will demonstrate problem solving for:  Moderate Assistance - Patient 50 - 74%  Minimal Assistance - Patient > 75% Note: Downgraded d/t lack of progress   Problem: RH Memory Goal: LTG Patient will use memory compensatory aids to (SLP) Description: LTG:  Patient will use memory compensatory aids to recall biographical/new, daily complex information with cues (SLP) Flowsheets (Taken 02/04/2020 1121) LTG: Patient will use memory compensatory aids to (SLP):  Moderate Assistance - Patient 50 - 74%  Minimal Assistance - Patient > 75% Note: Downgraded d/t lack of progress   Problem: RH Attention Goal: LTG Patient will demonstrate this level of attention during functional activites (SLP) Description: LTG:  Patient will will demonstrate this level of attention during functional activites (SLP) Flowsheets (Taken 02/04/2020 1121) Patient will demonstrate during cognitive/linguistic activities the attention type of: Sustained Patient will demonstrate this level of attention during cognitive/linguistic activities in: Controlled LTG: Patient will demonstrate this level of attention during cognitive/linguistic activities with assistance of (SLP): Moderate Assistance - Patient 50 - 74% Number of minutes patient will demonstrate attention during cognitive/linguistic activities: 5 minutes Note: Downgraded d/t lack of progress   Problem: RH Awareness Goal: LTG: Patient will demonstrate awareness during functional activites type of (SLP) Description: LTG: Patient will demonstrate awareness during functional activites type of (SLP) Flowsheets (Taken 02/04/2020 1121) Patient  will demonstrate during cognitive/linguistic activities awareness type of: Intellectual LTG: Patient will demonstrate awareness during cognitive/linguistic activities with assistance of (SLP): Moderate Assistance - Patient 50 - 74% Note: Downgraded d/t slower than expected progress

## 2020-02-04 NOTE — Progress Notes (Signed)
[place] PHYSICAL MEDICINE & REHABILITATION PROGRESS NOTE   Subjective/Complaints:  Back pain seems to radiate into both thighs no numbness or tingling  No LE weakness  no new bowel or bladder issues  Discussed K+   ROS neg CP, SOB, N/V/D Objective:   No results found. No results for input(s): WBC, HGB, HCT, PLT in the last 72 hours. Recent Labs    02/01/20 1650 02/04/20 0505  NA 136 137  K 3.6 3.3*  CL 103 107  CO2 24 21*  GLUCOSE 116* 103*  BUN 6* 5*  CREATININE 0.51 0.52  CALCIUM 8.5* 8.8*    Intake/Output Summary (Last 24 hours) at 02/04/2020 0824 Last data filed at 02/04/2020 0710 Gross per 24 hour  Intake 340 ml  Output --  Net 340 ml     Physical Exam: Vital Signs Blood pressure 101/62, pulse 83, temperature 98 F (36.7 C), temperature source Oral, resp. rate 16, height '5\' 4"'$  (1.626 m), weight 56.4 kg, SpO2 99 %.  General: No acute distress Mood and affect are appropriate Heart: Regular rate and rhythm no rubs murmurs or extra sounds Lungs: Clear to auscultation, breathing unlabored, no rales or wheezes Abdomen: Positive bowel sounds, soft nontender to palpation, nondistended Extremities: No clubbing, cyanosis, or edema Skin: No evidence of breakdown, no evidence of rash Neurologic: Cranial nerves II through XII intact, motor strength is 5/5 in bilateral deltoid, bicep, tricep, grip, hip flexor, knee extensors, ankle dorsiflexor and plantar flexor  Musculoskeletal: Lumbar PSIS tenderness today rather than at lumbar area   Assessment/Plan: 1. Functional deficits secondary to [facility]  which require 3+ hours per day of interdisciplinary therapy in a comprehensive inpatient rehab setting.  Physiatrist is providing close team supervision and 24 hour management of active medical problems listed below.  Physiatrist and rehab team continue to assess barriers to discharge/monitor patient progress toward functional and medical goals  Care Tool:  Bathing   Bathing activity did not occur: Refused Body parts bathed by patient: Right arm, Left arm, Chest, Abdomen, Front perineal area, Buttocks, Right upper leg, Left upper leg, Right lower leg, Left lower leg, Face         Bathing assist Assist Level: Contact Guard/Touching assist     Upper Body Dressing/Undressing Upper body dressing Upper body dressing/undressing activity did not occur (including orthotics): Refused What is the patient wearing?: Pull over shirt    Upper body assist Assist Level: Set up assist    Lower Body Dressing/Undressing Lower body dressing    Lower body dressing activity did not occur: Refused What is the patient wearing?: Underwear/pull up, Pants     Lower body assist Assist for lower body dressing: Contact Guard/Touching assist Assistive Device Comment: walker   Toileting Toileting    Toileting assist Assist for toileting: Supervision/Verbal cueing     Transfers Chair/bed transfer  Transfers assist     Chair/bed transfer assist level: Contact Guard/Touching assist     Locomotion Ambulation   Ambulation assist   Ambulation activity did not occur: Safety/medical concerns(pt with low K+ on eval, limited to bedside and transfers in the room per MD)  Assist level: Minimal Assistance - Patient > 75% Assistive device: No Device Max distance: 500   Walk 10 feet activity   Assist  Walk 10 feet activity did not occur: Safety/medical concerns  Assist level: Minimal Assistance - Patient > 75% Assistive device: No Device   Walk 50 feet activity   Assist Walk 50 feet with 2 turns activity did  not occur: Safety/medical concerns  Assist level: Minimal Assistance - Patient > 75% Assistive device: No Device    Walk 150 feet activity   Assist Walk 150 feet activity did not occur: Safety/medical concerns  Assist level: Minimal Assistance - Patient > 75% Assistive device: No Device    Walk 10 feet on uneven surface  activity   Assist  Walk 10 feet on uneven surfaces activity did not occur: Safety/medical concerns(pt with low K+ on eval, limited to bedside and transfers in the room per MD)   Assist level: Minimal Assistance - Patient > 75% Assistive device: Other (comment)(none)   Wheelchair     Assist     Wheelchair activity did not occur: Safety/medical concerns(pt with low K+ on eval, limited to bedside and transfers in the room per MD)         Wheelchair 50 feet with 2 turns activity    Assist    Wheelchair 50 feet with 2 turns activity did not occur: Safety/medical concerns       Wheelchair 150 feet activity     Assist  Wheelchair 150 feet activity did not occur: Safety/medical concerns       Blood pressure 101/62, pulse 83, temperature 98 F (36.7 C), temperature source Oral, resp. rate 16, height '5\' 4"'$  (1.626 m), weight 56.4 kg, SpO2 99 %.  Medical Problem List and Plan: 1. Decline in cognition and mobility secondary to Right SAH -patient may  shower. -ELOS/Goals: 7-10d  -Continue CIR PT, OT, SLP- Team conference today please see physician documentation under team conference tab, met with team  to discuss problems,progress, and goals. Formulized individual treatment plan based on medical history, underlying problem and comorbidities. 2. Antithrombotics: -DVT/anticoagulation:Mechanical:Sequential compression devices, below kneeBilateral lower extremities -antiplatelet therapy: N/a 3.Headaches/Pain Management:Will add Topamax and titrate slowly. Will add K pad for shoulder pain and continue robaxin prnfor shoulder pain. D/C ibuprofen. Will change hydrocodone to oxycodone and monitor.  3/22: Pain from cervical osteophytes at C5-C6 and C6-C7 8/10 to patient and limiting her participation in therapy. She has been using Kpads. Add diclofenac gel and schedule tylenol.  3/24 pain in low back not neck today ,states it started after fall will check  Xray, was a smoker ,may have osteoporosis 4. Mood:LCSW to follow for evaluation and support. -antipsychotic agents: N/A 5. Neuropsych: This patientiscapable of making decisions on herown behalf. 6. Skin/Wound Care:Routine pressure relief measures. 7. Fluids/Electrolytes/Nutrition:Monitor I/O.Check labs in am. 8. H/o Meniere's disease:In remission. 9. COPD:Continue Breo. Will resume claritin. 10. Nausea/dizziness: Vestibular evaluation/tx to help manage symptoms.  Will also order orthostatic BP.May need to add scopolamine patch.  11. HTN: Monitor BP tid--goal <140. On Nimotop started on 3/14-->end date 4/4..  3/22: Has been consistently elevated, could be pain related. Add amlodipine '5mg'$ .  12. Leucocytosis: Monitor for signs of infection and trend out--may rise with addition of steroids.  13.  Hypo K+, likely nutritional will do K rider x 6  K+ down to 3.3 increase oral supplement to 24mq BID  LOS: 4 days A FACE TO FACE EVALUATION WAS PERFORMED  ACharlett Blake3/24/2021, 8:24 AM

## 2020-02-05 ENCOUNTER — Inpatient Hospital Stay (HOSPITAL_COMMUNITY): Payer: Medicare HMO | Admitting: Physical Therapy

## 2020-02-05 ENCOUNTER — Inpatient Hospital Stay (HOSPITAL_COMMUNITY): Payer: Medicare HMO | Admitting: Occupational Therapy

## 2020-02-05 ENCOUNTER — Inpatient Hospital Stay (HOSPITAL_COMMUNITY): Payer: Medicare HMO

## 2020-02-05 ENCOUNTER — Inpatient Hospital Stay (HOSPITAL_COMMUNITY): Payer: Medicare HMO | Admitting: Speech Pathology

## 2020-02-05 MED ORDER — DEXAMETHASONE 4 MG PO TABS
4.0000 mg | ORAL_TABLET | Freq: Two times a day (BID) | ORAL | Status: DC
  Administered 2020-02-05 – 2020-02-09 (×10): 4 mg via ORAL
  Filled 2020-02-05 (×9): qty 1

## 2020-02-05 MED ORDER — TRAMADOL HCL 50 MG PO TABS
50.0000 mg | ORAL_TABLET | Freq: Four times a day (QID) | ORAL | Status: DC | PRN
  Administered 2020-02-05 – 2020-02-09 (×6): 50 mg via ORAL
  Filled 2020-02-05 (×6): qty 1

## 2020-02-05 MED ORDER — HYDROCODONE-ACETAMINOPHEN 5-325 MG PO TABS
1.0000 | ORAL_TABLET | Freq: Four times a day (QID) | ORAL | Status: DC | PRN
  Administered 2020-02-06 (×2): 1 via ORAL
  Filled 2020-02-05 (×2): qty 1

## 2020-02-05 MED ORDER — POTASSIUM CHLORIDE CRYS ER 20 MEQ PO TBCR
30.0000 meq | EXTENDED_RELEASE_TABLET | Freq: Two times a day (BID) | ORAL | Status: DC
  Administered 2020-02-05 – 2020-02-12 (×13): 30 meq via ORAL
  Filled 2020-02-05 (×14): qty 1

## 2020-02-05 NOTE — Progress Notes (Signed)
[facility] PHYSICAL MEDICINE & REHABILITATION PROGRESS NOTE   Subjective/Complaints:   No more neck pain but has had some back pain.  We discussed that she does have some degenerative disc and lumbar spine x-ray.  There was also an incidental finding of aneurysm.  Abdominal ultrasound was performed and vascular surgery will see the patient today. Discussed with neurosurgery back pain is common with subarachnoid blood after rupture.  Usually is treated with steroids.  Her increased pain seem to coincide when she came off of her dexamethasone.  Also came off her her oxycodone at that time  ROS neg CP, SOB, N/V/D Objective:   DG Lumbar Spine 2-3 Views  Result Date: 02/04/2020 CLINICAL DATA:  Recent fall with low back pain, initial encounter EXAM: LUMBAR SPINE - 2-3 VIEW COMPARISON:  None. FINDINGS: Five lumbar type vertebral bodies are well visualized. Vertebral body height is well maintained. Disc space narrowing is noted at L4-5. No compression deformities are seen. No anterolisthesis is noted. Aortic calcifications are noted with dilatation to 4.8 cm on the lateral film. IMPRESSION: Degenerative change of the lumbar spine without acute bony abnormality. Dilatation of the infrarenal aorta to 4.8 cm on the lateral film. CTA is recommended for further evaluation. Electronically Signed   By: Alcide Clever M.D.   On: 02/04/2020 16:31   US AORTA  Result Date: 02/05/2020 CLINICAL DATA:  Abdominal aortic aneurysm EXAM: ULTRASOUND OF ABDOMINAL AORTA TECHNIQUE: Ultrasound examination of the abdominal aorta and proximal common iliac arteries was performed to evaluate for aneurysm. Additional color and Doppler images of the distal aorta were obtained to document patency. COMPARISON:  CT angiogram chest including portions of upper abdomen January 23, 2020 FINDINGS: Abdominal aortic measurements as follows: Proximal:  2.8 x 2.7 cm Mid:  3.4 x 3.1 cm Distal:  4.2 x 4.6 cm Patent: Yes, peak systolic velocity is 46  cm/s Right common iliac artery: 1.1 x 1.0  cm Left common iliac artery: 1.1 x 1.2 cm cm There is extensive atherosclerotic plaque in the aorta with areas of peripheral wall calcification. In the area of the aneurysm distally, there is decreased echogenicity surrounding the lumen, likely chronic thrombus. No periaortic fluid or adenopathy. Note that the area of aneurysmal dilatation measures approximately 4.3 cm in length. IMPRESSION: Distal abdominal aortic aneurysm with maximum transverse diameter of 4.2 x 4.6 cm. Aneurysm extends over approximately 4.6 cm. Peripheral thrombus noted in the area of aneurysm. There is aortic atherosclerosis.  No periaortic fluid or adenopathy. Recommend followup by abdomen and pelvis CTA in 6 months, and vascular surgery referral/consultation if not already obtained. This recommendation follows ACR consensus guidelines: White Paper of the ACR Incidental Findings Committee II on Vascular Findings. J Am Coll Radiol 2013; 10:789-794. Electronically Signed   By: Bretta Bang III M.D.   On: 02/05/2020 08:03   No results for input(s): WBC, HGB, HCT, PLT in the last 72 hours. Recent Labs    02/04/20 0505  NA 137  K 3.3*  CL 107  CO2 21*  GLUCOSE 103*  BUN 5*  CREATININE 0.52  CALCIUM 8.8*    Intake/Output Summary (Last 24 hours) at 02/05/2020 0939 Last data filed at 02/04/2020 1750 Gross per 24 hour  Intake 322 ml  Output --  Net 322 ml     Physical Exam: Vital Signs Blood pressure 107/75, pulse 75, temperature 98.2 F (36.8 C), temperature source Oral, resp. rate 16, height 5\' 4"  (1.626 m), weight 56.4 kg, SpO2 96 %.  General: No acute distress Mood and affect are appropriate Heart: Regular rate and rhythm no rubs murmurs or extra sounds Lungs: Clear to auscultation, breathing unlabored, no rales or wheezes Abdomen: Positive bowel sounds, soft nontender to palpation, nondistended Extremities: No clubbing, cyanosis, or edema Skin: No evidence of  breakdown, no evidence of rash Neurologic: Cranial nerves II through XII intact, motor strength is 5/5 in bilateral deltoid, bicep, tricep, grip, hip flexor, knee extensors, ankle dorsiflexor and plantar flexor Sensory exam normal sensation to light touch and proprioception in bilateral upper and lower extremities   Mild PSIS tenderness No pain in the lower extremity with range of motion negative straight leg raise.  There is no pain to palpation in the popliteal area although the patient states that his sore area on the left side only.  No knee effusion.  Assessment/Plan: 1. Functional deficits secondary to [facility]  which require 3+ hours per day of interdisciplinary therapy in a comprehensive inpatient rehab setting.  Physiatrist is providing close team supervision and 24 hour management of active medical problems listed below.  Physiatrist and rehab team continue to assess barriers to discharge/monitor patient progress toward functional and medical goals  Care Tool:  Bathing  Bathing activity did not occur: Refused Body parts bathed by patient: Right arm, Left arm, Chest, Abdomen, Front perineal area, Buttocks, Right upper leg, Left upper leg, Right lower leg, Left lower leg, Face         Bathing assist Assist Level: Contact Guard/Touching assist     Upper Body Dressing/Undressing Upper body dressing Upper body dressing/undressing activity did not occur (including orthotics): Refused What is the patient wearing?: Pull over shirt    Upper body assist Assist Level: Set up assist    Lower Body Dressing/Undressing Lower body dressing    Lower body dressing activity did not occur: Refused What is the patient wearing?: Underwear/pull up, Pants     Lower body assist Assist for lower body dressing: Supervision/Verbal cueing Assistive Device Comment: walker   Toileting Toileting    Toileting assist Assist for toileting: Supervision/Verbal cueing     Transfers Chair/bed  transfer  Transfers assist     Chair/bed transfer assist level: Supervision/Verbal cueing     Locomotion Ambulation   Ambulation assist   Ambulation activity did not occur: Safety/medical concerns(pt with low K+ on eval, limited to bedside and transfers in the room per MD)  Assist level: Supervision/Verbal cueing Assistive device: Walker-rolling Max distance: 150   Walk 10 feet activity   Assist  Walk 10 feet activity did not occur: Safety/medical concerns  Assist level: Supervision/Verbal cueing Assistive device: Walker-rolling   Walk 50 feet activity   Assist Walk 50 feet with 2 turns activity did not occur: Safety/medical concerns  Assist level: Supervision/Verbal cueing Assistive device: Walker-rolling    Walk 150 feet activity   Assist Walk 150 feet activity did not occur: Safety/medical concerns  Assist level: Supervision/Verbal cueing Assistive device: Walker-rolling    Walk 10 feet on uneven surface  activity   Assist Walk 10 feet on uneven surfaces activity did not occur: Safety/medical concerns(pt with low K+ on eval, limited to bedside and transfers in the room per MD)   Assist level: Minimal Assistance - Patient > 75% Assistive device: Other (comment)(none)   Wheelchair     Assist     Wheelchair activity did not occur: Safety/medical concerns(pt with low K+ on eval, limited to bedside and transfers in the room per MD)  Wheelchair 50 feet with 2 turns activity    Assist    Wheelchair 50 feet with 2 turns activity did not occur: Safety/medical concerns       Wheelchair 150 feet activity     Assist  Wheelchair 150 feet activity did not occur: Safety/medical concerns       Blood pressure 107/75, pulse 75, temperature 98.2 F (36.8 C), temperature source Oral, resp. rate 16, height 5\' 4"  (1.626 m), weight 56.4 kg, SpO2 96 %.  Medical Problem List and Plan: 1. Decline in cognition and mobility secondary to  Right SAH Discussed with neurosurgery, likely has meningeal irritation from subarachnoid blood causing her low back and extremity symptomatology.  Will restart dexamethasone for about 1 week 4 mg twice daily over the weekend and then will taper to 2 mg upon discharge to finish the course  -Continue CIR PT, OT, SLP-plan discharge 3/30 2. Antithrombotics: -DVT/anticoagulation:Mechanical:Sequential compression devices, below kneeBilateral lower extremities -antiplatelet therapy: N/a 3.Headaches/Pain Management:Will add Topamax and titrate slowly. Will add K pad for shoulder pain and continue robaxin prnfor shoulder pain. D/C ibuprofen. Will change hydrocodone to oxycodone and monitor.  3/22: Pain from cervical osteophytes at C5-C6 and C6-C7 8/10 to patient and limiting her participation in therapy. She has been using Kpads. Add diclofenac gel and schedule tylenol.  3/23 pain in low back not neck today , discussed sleeping position, avoid side lying with HOB elevated, add sportscreme, trial tramadol for moderate pain, hydrocodone for more severe pain 4. Mood:LCSW to follow for evaluation and support. -antipsychotic agents: N/A 5. Neuropsych: This patientiscapable of making decisions on herown behalf. 6. Skin/Wound Care:Routine pressure relief measures. 7. Fluids/Electrolytes/Nutrition:Monitor I/O.Check labs in am. 8. H/o Meniere's disease:In remission. 9. COPD:Continue Breo. Will resume claritin. 10. Nausea/dizziness: Vestibular evaluation/tx to help manage symptoms.  Will also order orthostatic BP.May need to add scopolamine patch.  11. HTN: Monitor BP tid--goal <140. On Nimotop started on 3/14-->end date 4/4..  3/22: Has been consistently elevated, could be pain related. Add amlodipine 5mg .  12. Leucocytosis: Monitor for signs of infection and trend out--may rise with addition of steroids.  13.  Hypo K+, likely nutritional will do K  rider x 6 today , repeat BMET in 6 hours, add po KCL after IV  3/21: K+ much improved to 3.6.  Will start KCL daily recheck BMET 3/24  LOS: 5 days A FACE TO FACE EVALUATION WAS PERFORMED  02/05/2020, 9:39 AM

## 2020-02-05 NOTE — Progress Notes (Signed)
Per Case manager  Sister will be here today for family education.

## 2020-02-05 NOTE — Progress Notes (Signed)
Occupational Therapy Session Note  Patient Details  Name: Autumn Le MRN: [id number] Date of Birth: October 25, 1946  Today's Date: 02/05/2020 OT Individual Time: 0930-1040 OT Individual Time Calculation (min): 70 min    Short Term Goals: No short term goals set  Skilled Therapeutic Interventions/Progress Updates:    Pt received in bed. Upon entering the room, this therapist said "good morning". Pt became very upset and said she is "so sick of people telling her good morning because it is 830 at night!" Had pt look out the window to see the daylight and the clock on her phone.  Pt upset that we are all wrong and that she is tired of being lied to.  Tried to reassure the patient that we would never tell her the wrong time of day as we do not want to confuse her.    Pt laying in bed and it took over 20 minutes to convince her to change clothing and wash up. Pt declined a shower but eventually and expressing a lot of frustration, bathed and dressed with S.  "what is the point in putting on clean clothes if I am just going to lay in bed?"  She ambulated to the bathroom with RW with S, toileted S, walked back to the sink to wash hands and brush teeth.    Pt sat on bed and c/o dizziness,  Blood pressure very low 77/49.  Pt  layed down and put in trendelenburg position and blood pressure improved to 130/59.  Dietary came in to take her order but pt ignoring dietary tech and just stated how ridiculous it was to take a dinner order in the morning.  It took over 10 minutes for pt to cooperate despite cues from this therapist to choose between 2 items.    Pt tried to sit up again but had too much back pain.  Adjusted in bed with all needs met.    Therapy Documentation Precautions:  Precautions Precautions: Fall Precaution Comments: goal SBP<140 Restrictions Weight Bearing Restrictions: No Pain: Pain Assessment Pain Scale: 0-10 Pain Score: 10-Worst pain ever Pain Type: Acute pain Pain  Location: Back Pain Orientation: Lower Pain Descriptors / Indicators: Throbbing Pain Frequency: Constant Pain Onset: On-going Patients Stated Pain Goal: 1 Pain Intervention(s): Medication (See eMAR) ADL: ADL Eating: Set up Where Assessed-Eating: Bed level Grooming: Setup Where Assessed-Grooming: Edge of bed Toileting: Minimal assistance Where Assessed-Toileting: Bedside Commode Toilet Transfer: Minimal assistance Toilet Transfer Method: Stand pivot Toilet Transfer Equipment: Bedside commode ADL Comments: Limited activity on eval due to easily frustrated with direction, as well as limited to bedside therapy due to low K+   Therapy/Group: Individual Therapy  [place] 02/05/2020, 12:16 PM

## 2020-02-05 NOTE — Progress Notes (Signed)
Physical Therapy Session Note  Patient Details  Name: Autumn Le MRN: [id number] Date of Birth: 23-Dec-1945  Today's Date: 02/05/2020 PT Individual Time: 3419-6222 PT Individual Time Calculation (min): 55 min   Short Term Goals: Week 1:  PT Short Term Goal 1 (Week 1): STG=LTG due to short ELOS.  Skilled Therapeutic Interventions/Progress Updates:    Pt received supine in bed with her sister present for family education/training and pt agreeable to therapy session. Pt repeated twice during her session that she becomes fatigued more easily and once that happens she is unable to continue participating in therapy that day. Pt also reports that when her low back pain increases it wraps around B thighs and feels like a throbbing pain almost as though someone has broken her femurs - states when this happens she feels that it is increasingly more difficult to break through the pain. Pt appears confused during session stating she has not been receiving Speech Therapy. Therapist educated pt's sister on pt's CLOF, need for 24hr support upon D/C, proper use of gait belt, and need to use RW for all standing/ambulatory tasks. Supine>sit, HOB flat and not using bedrails, with supervision and increased time. Sit<>stands using RW with CGA/close supervision for safety throughout session. Gait training ~117ft to ortho gym using RW with CGA/close supervision for safety - pt continues to demonstrate decreased gait speed, decreased speed of movement, and decreased B LE step lengths throughout session. Ambulatory simulated car transfer using RW with CGA for steadying - therapist educating pt/sister on turning to sit as opposed to side stepping into the vehicle for increased safety. Ambulated ~20ft up/down ramp using RW with CGA for safety. Ambulated ~131ft to main therapy gym using RW with CGA/close supervision. Pt reports onset of lightheadedness - vitals assessed: BP 129/63 (MAP 80), HR 64bpm and after seated rest break  symptoms dissipated though pt reports she has had on/off lightheadedness since hospitalization. Therapist educated pt/family on proper technique to navigate stairs as pt's sister reports her home has 3STE with R HR. Pt ascended/descended 4 steps using R HR only with cuing for step-to pattern leading with R LE on ascent and L LE on descent as pt reports sometimes that knee feels like it will buckle - pt repeated this task with her sister providing proper CGA for safety with min cuing from therapist. Therapist educated pt's sister on her need to attend an OT and SLP treatment session for family education and she agreed to return tomorrow. Therapist provided pt with RW bag and educated her on proper use. Gait training ~148ft back to room using RW with pt's sister providing proper CGA. Pt reports need to use bathroom. Sit<>stand toilet<>RW and grab bars with pt's sister providing proper CGA - pt continent of bladder and able to perform LB clothing management and peri-care without assist. Sit>supine with supervision. Pt left supine in bed with needs in reach and bed alarm on. Pt/sister report no questions/concerns at this time and pt's sister reports feeling confident assisting pt at home.  Pt's plan is to D/C to her sisters home which is 1story, 3 STE with R HR, so that the sister can provide 24hr support.   Therapy Documentation Precautions:  Precautions Precautions: Fall Precaution Comments: goal SBP<140 Restrictions Weight Bearing Restrictions: No  Pain: Pt talks about her pain experience but does not report pain during therapy session and is not limited in mobility tasks.   Therapy/Group: Individual Therapy  Ginny Forth, PT, DPT 02/05/2020, 12:59 PM

## 2020-02-05 NOTE — Progress Notes (Signed)
Patient is confused today claims it is 8pm at night reoriented to day claims you can call it anything you want too. Patient is very angry today but able to take meds.

## 2020-02-05 NOTE — Progress Notes (Signed)
Patient was given milk of magnesia 30 mL last night at 2050 for constipation. Patient reported had BM yesterday morning 02/04/20 after therapy session. Patient still has no BM output this rotation but passing gas. No c/o  severe headache and back pain all night. Will continue to monitor patient. Lajoyce Corners Burk Hoctor, LPN

## 2020-02-05 NOTE — Consult Note (Signed)
Called by Marissa Nestle yesterday regarding possible abdominal aortic aneurysm seen on lumbar spine plain film.  Patient is scheduled to get an aortic ultrasound today.  I will see her tomorrow morning and do full consult.  Fabienne Bruns, MD [facility] Office: [phone]

## 2020-02-05 NOTE — Progress Notes (Signed)
Speech Language Pathology Daily Session Note  Patient Details  Name: AMILIA VANDENBRINK MRN: [id number] Date of Birth: 11-Mar-1946  Today's Date: 02/05/2020 SLP Individual Time: 0750-0800 SLP Individual Time Calculation (min): 10 min  Short Term Goals: Week 1: SLP Short Term Goal 1 (Week 1): STG=LTG due to ELOS  Skilled Therapeutic Interventions:  Skilled treatment session targeted pt's cognition goals. SLP recieved pt in bed. Upon entering the room, pt roller her eyes and sighed deeply when seeing SLP. Initially pt didn't respond to greeting and social comments provided by this Clinical research associate. When SLP begin setting up therapy task pt began rapidly voicing complaints about various aspects of her care. Gentle redirection provided and pt continued to state "I am just done, I am so over this, I am done, I tell you just done." Support provided but didn't increase pt's ability to participate in session.      Pain    Therapy/Group: Individual Therapy  Harvy Riera 02/05/2020, 11:39 AM

## 2020-02-05 NOTE — Plan of Care (Signed)
  Problem: RH BOWEL ELIMINATION Goal: RH STG MANAGE BOWEL WITH ASSISTANCE Description: STG Manage Bowel with minimal assistance. Outcome: Not Progressing; LBM 3/20

## 2020-02-05 NOTE — Progress Notes (Signed)
Team Conference Report to Patient/Family  Team Conference discussion was reviewed with the patient and sister, including goals, any changes in plan of care and initial date for discharge date of 02/07/20.  Patient and caregiver express understanding and areconcerned that the patient will not be ready for discharge given back pain and she has missed several therapy sessions. The team reassessed and extended the discharge to assure time to address pain and functional limitations before discharging to her sister's home in [place]. The patient has a target discharge date of 02/10/20.Family education set up with sister for 02/05/20.  Chana Bode B 02/05/2020, 4:04 PM

## 2020-02-06 ENCOUNTER — Inpatient Hospital Stay (HOSPITAL_COMMUNITY): Payer: Medicare HMO | Admitting: Physical Therapy

## 2020-02-06 ENCOUNTER — Inpatient Hospital Stay (HOSPITAL_COMMUNITY): Payer: Medicare HMO | Admitting: Speech Pathology

## 2020-02-06 ENCOUNTER — Inpatient Hospital Stay (HOSPITAL_COMMUNITY): Payer: Medicare HMO | Admitting: Occupational Therapy

## 2020-02-06 DIAGNOSIS — J449 Chronic obstructive pulmonary disease, unspecified: Secondary | ICD-10-CM

## 2020-02-06 DIAGNOSIS — I714 Abdominal aortic aneurysm, without rupture: Secondary | ICD-10-CM

## 2020-02-06 LAB — BASIC METABOLIC PANEL
Anion gap: 9 (ref 5–15)
BUN: 10 mg/dL (ref 8–23)
CO2: 21 mmol/L — ABNORMAL LOW (ref 22–32)
Calcium: 9.3 mg/dL (ref 8.9–10.3)
Chloride: 110 mmol/L (ref 98–111)
Creatinine, Ser: 0.52 mg/dL (ref 0.44–1.00)
GFR calc Af Amer: >60 mL/min (ref >60)
GFR calc non Af Amer: >60 mL/min (ref >60)
Glucose, Bld: 108 mg/dL — ABNORMAL HIGH (ref 70–99)
Potassium: 4.5 mmol/L (ref 3.5–5.1)
Sodium: 140 mmol/L (ref 135–145)

## 2020-02-06 MED ORDER — NIMODIPINE 30 MG PO CAPS: 60.0000 mg | ORAL_CAPSULE | ORAL | 0 refills | Status: DC

## 2020-02-06 MED FILL — niMODipine 30 MG CAPS: 30 | 4 days supply | Qty: 50 | Fill #0

## 2020-02-06 NOTE — Progress Notes (Signed)
Speech Language Pathology Daily Session Note  Patient Details  Name: JATZIRI GOFFREDO MRN: [id number] Date of Birth: [birth year]  Today's Date: 02/06/2020 SLP Individual Time: 1345-1425 SLP Individual Time Calculation (min): 40 min  Short Term Goals: Week 1: SLP Short Term Goal 1 (Week 1): STG=LTG due to ELOS  Skilled Therapeutic Interventions:  Skilled treatment targeted education with pt's sister and niece. Education given on moderate cognitive deficits, including score on MOCA. At this level of impairment pt will require assistance with medication management, someone to take her to appointments, keep up with the appointments, supervise pt when ambulating around house and will required assistance with meals. In addition to these tasks, pt has very poor task tolerance and attempts to control aspects of her environment. Pt lacks awareness of deficits and has shown the ability to process these deficits. Pt's sister and niece had concerns that were passed to pt's care manager.      Pain Pain Assessment Pain Scale: 0-10 Pain Score: 5  Pain Type: Acute pain Pain Location: Head Pain Descriptors / Indicators: Throbbing Pain Frequency: Intermittent Pain Onset: On-going Patients Stated Pain Goal: 0 Pain Intervention(s): Medication (See eMAR)  Therapy/Group: Individual Therapy  Anabela Crayton 02/06/2020, 2:59 PM

## 2020-02-06 NOTE — Progress Notes (Addendum)
Patient ID: Autumn Le, female   DOB: Apr 30, 1946, 74 y.o.   MRN: [id number] SLP noted:Niece is concerned about patient discharging home with her parents (father has dementia).Patient May be a burden to her sister because of her moderate cognitive deficits. She may be supervision with mobility/ADLs but she doesn't have the cognitive ability to perform any tasks such a meals, cleaning, anything. She is frequently disoriented to time of day has breakfast tray present and pancakes and she states they are from previous day. Her memory and awareness of cognitive defcits make her a huge safety risk at home. Her sister will have to provided Min to Mod A for most activities d/t cognitive deficits.  Sister to be made aware of level of assistance patient will require at discharge. Niece was present for discussion and patient to review insurance coverage, options for discharge and services that would be provided at discharge. At the time,patient's sister reported she will take the patient home and has come in for family education on several occassions. They will review information provided an confirm discharge destination on Monday 02/09/20.

## 2020-02-06 NOTE — Plan of Care (Signed)
  Problem: Consults Goal: RH GENERAL PATIENT EDUCATION Description: See Patient Education module for education specifics. Outcome: Progressing   Problem: RH BOWEL ELIMINATION Goal: RH STG MANAGE BOWEL WITH ASSISTANCE Description: STG Manage Bowel with minimal assistance. Outcome: Progressing   Problem: RH BLADDER ELIMINATION Goal: RH STG MANAGE BLADDER WITH ASSISTANCE Description: STG Manage Bladder With minimal Assistance Outcome: Progressing   Problem: RH SKIN INTEGRITY Goal: RH STG SKIN FREE OF INFECTION/BREAKDOWN Outcome: Progressing

## 2020-02-06 NOTE — Progress Notes (Signed)
[place] PHYSICAL MEDICINE & REHABILITATION PROGRESS NOTE   Subjective/Complaints:   No more neck pain but has had some back pain.  We discussed that she does have some degenerative disc and lumbar spine x-ray.  There was also an incidental finding of aneurysm.   vascular surgery will see the patient today.  stil with back , leg and HA pain , discussed with pt this is due to subarachnoid blood  Hydrocodone restarted but not given to pt , received 2 tramadol yesterday   ROS neg CP, SOB, N/V/D Objective:   DG Lumbar Spine 2-3 Views  Result Date: 02/04/2020 CLINICAL DATA:  Recent fall with low back pain, initial encounter EXAM: LUMBAR SPINE - 2-3 VIEW COMPARISON:  None. FINDINGS: Five lumbar type vertebral bodies are well visualized. Vertebral body height is well maintained. Disc space narrowing is noted at L4-5. No compression deformities are seen. No anterolisthesis is noted. Aortic calcifications are noted with dilatation to 4.8 cm on the lateral film. IMPRESSION: Degenerative change of the lumbar spine without acute bony abnormality. Dilatation of the infrarenal aorta to 4.8 cm on the lateral film. CTA is recommended for further evaluation. Electronically Signed   By: Alcide Clever M.D.   On: 02/04/2020 16:31   US AORTA  Result Date: 02/05/2020 CLINICAL DATA:  Abdominal aortic aneurysm EXAM: ULTRASOUND OF ABDOMINAL AORTA TECHNIQUE: Ultrasound examination of the abdominal aorta and proximal common iliac arteries was performed to evaluate for aneurysm. Additional color and Doppler images of the distal aorta were obtained to document patency. COMPARISON:  CT angiogram chest including portions of upper abdomen January 23, 2020 FINDINGS: Abdominal aortic measurements as follows: Proximal:  2.8 x 2.7 cm Mid:  3.4 x 3.1 cm Distal:  4.2 x 4.6 cm Patent: Yes, peak systolic velocity is 46 cm/s Right common iliac artery: 1.1 x 1.0  cm Left common iliac artery: 1.1 x 1.2 cm cm There is extensive  atherosclerotic plaque in the aorta with areas of peripheral wall calcification. In the area of the aneurysm distally, there is decreased echogenicity surrounding the lumen, likely chronic thrombus. No periaortic fluid or adenopathy. Note that the area of aneurysmal dilatation measures approximately 4.3 cm in length. IMPRESSION: Distal abdominal aortic aneurysm with maximum transverse diameter of 4.2 x 4.6 cm. Aneurysm extends over approximately 4.6 cm. Peripheral thrombus noted in the area of aneurysm. There is aortic atherosclerosis.  No periaortic fluid or adenopathy. Recommend followup by abdomen and pelvis CTA in 6 months, and vascular surgery referral/consultation if not already obtained. This recommendation follows ACR consensus guidelines: White Paper of the ACR Incidental Findings Committee II on Vascular Findings. J Am Coll Radiol 2013; 10:789-794. Electronically Signed   By: Bretta Bang III M.D.   On: 02/05/2020 08:03   No results for input(s): WBC, HGB, HCT, PLT in the last 72 hours. Recent Labs    02/04/20 0505 02/06/20 0526  NA 137 140  K 3.3* 4.5  CL 107 110  CO2 21* 21*  GLUCOSE 103* 108*  BUN 5* 10  CREATININE 0.52 0.52  CALCIUM 8.8* 9.3    Intake/Output Summary (Last 24 hours) at 02/06/2020 0757 Last data filed at 02/05/2020 1842 Gross per 24 hour  Intake 400 ml  Output --  Net 400 ml     Physical Exam: Vital Signs Blood pressure (!) 144/58, pulse 62, temperature 98.6 F (37 C), temperature source Oral, resp. rate 18, height 5\' 4"  (1.626 m), weight 56.4 kg, SpO2 99 %.    General:  No acute distress Mood and affect are appropriate Heart: Regular rate and rhythm no rubs murmurs or extra sounds Lungs: Clear to auscultation, breathing unlabored, no rales or wheezes Abdomen: Positive bowel sounds, soft nontender to palpation, nondistended Extremities: No clubbing, cyanosis, or edema Skin: No evidence of breakdown, no evidence of rash Neurologic: Cranial nerves  II through XII intact, motor strength is 5/5 in bilateral deltoid, bicep, tricep, grip, hip flexor, knee extensors, ankle dorsiflexor and plantar flexor  Mild PSIS tenderness No pain in the lower extremity with range of motion negative straight leg raise.  There is no pain to palpation in the popliteal area although the patient states that his sore area on the left side only.  No knee effusion.  Assessment/Plan: 1. Functional deficits secondary to [facility]  which require 3+ hours per day of interdisciplinary therapy in a comprehensive inpatient rehab setting.  Physiatrist is providing close team supervision and 24 hour management of active medical problems listed below.  Physiatrist and rehab team continue to assess barriers to discharge/monitor patient progress toward functional and medical goals  Care Tool:  Bathing  Bathing activity did not occur: Refused Body parts bathed by patient: Right arm, Left arm, Chest, Abdomen, Front perineal area, Buttocks, Right upper leg, Left upper leg, Right lower leg, Left lower leg, Face         Bathing assist Assist Level: Supervision/Verbal cueing     Upper Body Dressing/Undressing Upper body dressing Upper body dressing/undressing activity did not occur (including orthotics): Refused What is the patient wearing?: Bra, Pull over shirt    Upper body assist Assist Level: Set up assist    Lower Body Dressing/Undressing Lower body dressing    Lower body dressing activity did not occur: Refused What is the patient wearing?: Underwear/pull up, Pants     Lower body assist Assist for lower body dressing: Supervision/Verbal cueing Assistive Device Comment: walker   Toileting Toileting    Toileting assist Assist for toileting: Supervision/Verbal cueing     Transfers Chair/bed transfer  Transfers assist     Chair/bed transfer assist level: Contact Guard/Touching assist Chair/bed transfer assistive device: Civil engineer, contracting   Ambulation assist   Ambulation activity did not occur: Safety/medical concerns(pt with low K+ on eval, limited to bedside and transfers in the room per MD)  Assist level: Contact Guard/Touching assist Assistive device: Walker-rolling Max distance: 168ft   Walk 10 feet activity   Assist  Walk 10 feet activity did not occur: Safety/medical concerns  Assist level: Contact Guard/Touching assist Assistive device: Walker-rolling   Walk 50 feet activity   Assist Walk 50 feet with 2 turns activity did not occur: Safety/medical concerns  Assist level: Contact Guard/Touching assist Assistive device: Walker-rolling    Walk 150 feet activity   Assist Walk 150 feet activity did not occur: Safety/medical concerns  Assist level: Contact Guard/Touching assist Assistive device: Walker-rolling    Walk 10 feet on uneven surface  activity   Assist Walk 10 feet on uneven surfaces activity did not occur: Safety/medical concerns(pt with low K+ on eval, limited to bedside and transfers in the room per MD)   Assist level: Contact Guard/Touching assist Assistive device: Loss adjuster, chartered activity did not occur: Safety/medical concerns(pt with low K+ on eval, limited to bedside and transfers in the room per MD)         Wheelchair 50 feet with 2 turns activity    Assist  Wheelchair 50 feet with 2 turns activity did not occur: Safety/medical concerns       Wheelchair 150 feet activity     Assist  Wheelchair 150 feet activity did not occur: Safety/medical concerns       Blood pressure (!) 144/58, pulse 62, temperature 98.6 F (37 C), temperature source Oral, resp. rate 18, height 5\' 4"  (1.626 m), weight 56.4 kg, SpO2 99 %.  Medical Problem List and Plan: 1. Decline in cognition and mobility secondary to Right SAH Discussed with neurosurgery, likely has meningeal irritation from  subarachnoid blood causing her low back and extremity symptomatology.  Will restart dexamethasone for about 1 week 4 mg twice daily over the weekend and then will taper to 2 mg upon discharge to finish the course  -Continue CIR PT, OT, SLP-plan discharge 3/30 2. Antithrombotics: -DVT/anticoagulation:Mechanical:Sequential compression devices, below kneeBilateral lower extremities -antiplatelet therapy: N/a 3.Headaches/Pain Management:As per Neurosurgery back leg and HA pain likely due to subarachnoid blood from aneurysm rupture will restart steroids for several days No further imaging needed at this time Will add Topamax and titrate slowly. Will add K pad for shoulder pain and continue robaxin prnfor shoulder pain. D/C ibuprofen. Will change hydrocodone to oxycodone and monitor.  3/22: Pain from cervical osteophytes at C5-C6 and C6-C7 8/10 to patient and limiting her participation in therapy. She has been using Kpads. Add diclofenac gel and schedule tylenol.  3/23 pain in low back not neck today , discussed sleeping position, avoid side lying with HOB elevated, add sportscreme, trial tramadol for moderate pain, hydrocodone for more severe pain 4. Mood:LCSW to follow for evaluation and support. -antipsychotic agents: N/A 5. Neuropsych: This patientiscapable of making decisions on herown behalf. 6. Skin/Wound Care:Routine pressure relief measures. 7. Fluids/Electrolytes/Nutrition:Monitor I/O.Check labs in am. 8. H/o Meniere's disease:In remission. 9. COPD:Continue Breo. Will resume claritin. 10. Nausea/dizziness: Vestibular evaluation/tx to help manage symptoms.  Will also order orthostatic BP.May need to add scopolamine patch.  11. HTN: Monitor BP tid--goal <140. On Nimotop started on 3/14-->end date 4/4..  3/22: Has been consistently elevated, could be pain related. Add amlodipine 5mg .  12. Leucocytosis: Monitor for signs of infection and trend  out--may rise with addition of steroids.  13.  Hypo K+, likely nutritional will do K rider x 6 today , repeat BMET in 6 hours, add po KCL after IV  3/21: K+ much improved to 3.6.   KCL 5meq BID x 1 day K+ jumped to 4.5 Will schange KCL to 81meq BID, recheck Monday   LOS: 6 days A FACE TO FACE EVALUATION WAS PERFORMED  Autumn Le 02/06/2020, 7:57 AM

## 2020-02-06 NOTE — NC FL2 (Signed)
[place] LEVEL OF CARE SCREENING TOOL     IDENTIFICATION  Patient Name: Autumn Le Birthdate: 02-15-1946 Sex: female Admission Date (Current Location): 01/31/2020  [facility] [place] Number:  Engineering geologist and Address:  [facility], [place] [address], [place], [place] [address]      Provider Number:    Attending Physician Name and Address:  Charlett Blake, MD  Relative Name and Phone Number:  Eugenie Birks sister [phone]    Current Level of Care: Hospital Recommended Level of Care: [place] Prior Approval Number:    Date Approved/Denied:   PASRR Number: [id number]  Discharge Plan: SNF    Current Diagnoses: Patient Active Problem List   Diagnosis Date Noted  . Agitation   . Ruptured cerebral aneurysm ([place]) 01/31/2020  . SAH (subarachnoid hemorrhage) ([place]) 01/23/2020    Orientation RESPIRATION BLADDER Height & Weight     Self, Time, Situation, Place  Normal Continent Weight: 56.4 kg Height:  5\' 4"  (162.6 cm)  BEHAVIORAL SYMPTOMS/MOOD NEUROLOGICAL BOWEL NUTRITION STATUS      Continent Diet  AMBULATORY STATUS COMMUNICATION OF NEEDS Skin   Supervision Verbally Normal                       Personal Care Assistance Level of Assistance              Functional Limitations Info  Sight Sight Info: Impaired        SPECIAL CARE FACTORS FREQUENCY                       Contractures Contractures Info: Not present    Additional Factors Info                  Current Medications (02/06/2020):  This is the current hospital active medication list Current Facility-Administered Medications  Medication Dose Route Frequency Provider Last Rate Last Admin  . acetaminophen (TYLENOL) tablet 650 mg  650 mg Oral Q8H Raulkar, Clide Deutscher, MD   650 mg at 02/06/20 1455  . albuterol (PROVENTIL) (2.5 MG/3ML) 0.083% nebulizer solution 2.5 mg  2.5 mg Nebulization Q6H PRN Love, Pamela S,  PA-C      . alum & mag hydroxide-simeth (MAALOX/MYLANTA) 200-200-20 MG/5ML suspension 30 mL  30 mL Oral Q4H PRN Love, Pamela S, PA-C      . amLODipine (NORVASC) tablet 5 mg  5 mg Oral Daily Raulkar, Clide Deutscher, MD   5 mg at 02/06/20 0834  . bisacodyl (DULCOLAX) suppository 10 mg  10 mg Rectal Daily PRN Love, Pamela S, PA-C      . butalbital-acetaminophen-caffeine (FIORICET) 50-325-40 MG per tablet 1-2 tablet  1-2 tablet Oral Q4H PRN Bary Leriche, PA-C   2 tablet at 02/06/20 1244  . Chlorhexidine Gluconate Cloth 2 % PADS 6 each  6 each Topical Daily Bary Leriche, PA-C   6 each at 02/05/20 1738  . cholecalciferol (VITAMIN D3) tablet 2,000 Units  2,000 Units Oral QHS Bary Leriche, PA-C   2,000 Units at 02/05/20 2148  . dexamethasone (DECADRON) tablet 4 mg  4 mg Oral BID WC LoveIvan Anchors, PA-C   4 mg at 02/06/20 0867  . diclofenac Sodium (VOLTAREN) 1 % topical gel 2 g  2 g Topical QID Raulkar, Clide Deutscher, MD   2 g at 02/06/20 1245  . diphenhydrAMINE (BENADRYL) 12.5 MG/5ML elixir 12.5-25 mg  12.5-25 mg Oral  Q6H PRN Love, Evlyn Kanner, PA-C      . fluticasone (FLONASE) 50 MCG/ACT nasal spray 2 spray  2 spray Each Nare Daily Jacquelynn Cree, PA-C   2 spray at 02/03/20 2050  . fluticasone furoate-vilanterol (BREO ELLIPTA) 100-25 MCG/INH 1 puff  1 puff Inhalation Daily Jacquelynn Cree, PA-C   1 puff at 02/06/20 [phone]  . guaiFENesin-dextromethorphan (ROBITUSSIN DM) 100-10 MG/5ML syrup 5-10 mL  5-10 mL Oral Q6H PRN Love, Evlyn Kanner, PA-C      . HYDROcodone-acetaminophen (NORCO/VICODIN) 5-325 MG per tablet 1 tablet  1 tablet Oral Q6H PRN Erick Colace, MD   1 tablet at 02/06/20 [phone]  . loratadine (CLARITIN) tablet 10 mg  10 mg Oral QHS Jacquelynn Cree, PA-C   10 mg at 02/05/20 2150  . magnesium hydroxide (MILK OF MAGNESIA) suspension 30 mL  30 mL Oral Daily PRN Jacquelynn Cree, PA-C   30 mL at 02/04/20 2050  . methocarbamol (ROBAXIN) tablet 500 mg  500 mg Oral Q6H PRN Jacquelynn Cree, PA-C   500 mg at 02/03/20  1604  . Muscle Rub CREA   Topical BID PRN Erick Colace, MD   Given at 02/03/20 1315  . niMODipine (NIMOTOP) capsule 60 mg  60 mg Oral Q4H Kirsteins, Victorino Sparrow, MD   60 mg at 02/06/20 1245   Or  . niMODipine (NYMALIZE) 6 MG/ML oral solution 60 mg  60 mg Per Tube Q4H Kirsteins, Victorino Sparrow, MD      . ondansetron (ZOFRAN-ODT) disintegrating tablet 4 mg  4 mg Oral Q6H PRN Jacquelynn Cree, PA-C   4 mg at 02/03/20 0901   Or  . ondansetron (ZOFRAN) injection 4 mg  4 mg Intravenous Q6H PRN Love, Pamela S, PA-C      . pantoprazole (PROTONIX) EC tablet 40 mg  40 mg Oral Daily Jacquelynn Cree, PA-C   40 mg at 02/06/20 0834  . polyethylene glycol (MIRALAX / GLYCOLAX) packet 17 g  17 g Oral BID Jacquelynn Cree, PA-C   17 g at 02/06/20 0834  . potassium chloride (KLOR-CON) CR tablet 30 mEq  30 mEq Oral BID Erick Colace, MD   30 mEq at 02/06/20 0834  . prochlorperazine (COMPAZINE) tablet 5-10 mg  5-10 mg Oral Q6H PRN Love, Pamela S, PA-C       Or  . prochlorperazine (COMPAZINE) injection 5-10 mg  5-10 mg Intramuscular Q6H PRN Love, Pamela S, PA-C       Or  . prochlorperazine (COMPAZINE) suppository 12.5 mg  12.5 mg Rectal Q6H PRN Love, Pamela S, PA-C      . sodium phosphate (FLEET) 7-19 GM/118ML enema 1 enema  1 enema Rectal Once PRN Love, Pamela S, PA-C      . topiramate (TOPAMAX) tablet 25 mg  25 mg Oral QHS Love, Pamela S, PA-C   25 mg at 02/05/20 1956  . traMADol (ULTRAM) tablet 50 mg  50 mg Oral Q6H PRN Erick Colace, MD   50 mg at 02/05/20 1745  . traZODone (DESYREL) tablet 25-50 mg  25-50 mg Oral QHS PRN Jacquelynn Cree, PA-C         Discharge Medications: Please see discharge summary for a list of discharge medications.  Relevant Imaging Results:  Relevant Lab Results:   Additional Information    Pamelia Hoit, RN

## 2020-02-06 NOTE — Progress Notes (Signed)
Occupational Therapy Session Note  Patient Details  Name: Autumn Le MRN: [id number] Date of Birth: [birth year]  Today's Date: 02/06/2020 OT Individual Time: 1015-1100 OT Individual Time Calculation (min): 45 min    Short Term Goals: Week 1:  OT Short Term Goal 1 (Week 1): STG = LTGs due to ELOS  Skilled Therapeutic Interventions/Progress Updates:    1:1 Pt's sister and niece present. Discussed pt's progress and recommendations for home with her sister. Discussed requiring min to mod A for cognition (including orientation, sequencing, functional problem solving/ executive functioning and safety). Discussed recommendations for no driving. Pt discussed ADL performance at supervision to mod I. Pt performed toilet transfers/ toileting with distant supervision, functional ambulation around room and to the ADL apartment with RW with supervision. In apartment discussed tasks in the kitchen with supervision but encouraged participation in familiar household tasks. Discussed transporting items in walker bag and not trying to carry items while using the RW. Pt's sister's daughter expressed concern for the pt's sister and her ability to manage her husband and the pt.   Pt returned to the room and left resting in the bed with family present.   Therapy Documentation Precautions:  Precautions Precautions: Fall Precaution Comments: goal SBP<140 Restrictions Weight Bearing Restrictions: No  Pain: Had already received meds and reported feeling better   Therapy/Group: Individual Therapy  Roney Mans [facility] 02/06/2020, 12:36 PM

## 2020-02-06 NOTE — Progress Notes (Signed)
Occupational Therapy Session Note  Patient Details  Name: Autumn Le MRN: [id number] Date of Birth: [birth year]  Today's Date: 02/06/2020 OT Individual Time: 2671-2458 OT Individual Time Calculation (min): 65 min  and Today's Date: 02/06/2020 OT Missed Time: 10 Minutes Missed Time Reason: Patient fatigue   Short Term Goals: No short term goals set  Skilled Therapeutic Interventions/Progress Updates:    Pt received EOB  Getting medications from RN.  Recommended to pt that she shower today as it has been a few days.  Pt spent time complaining about the facility but eventually agreed to shower.  Using her Rw, pt ambulated to toilet with S then toileted with distant S (therapist stood outside door for privacy) she then sat on tub bench to undress, standing to remove pants over feet with one hand support on RW and no LOB. Showered with distant S (curtain closed) and then dried off and donned clothing from edge of tub bench. She stood to put on bra and shirt. Ambulated to sink for grooming. No LOB in standing and pt able complete those tasks independently. She can take several steps (ie sink to recliner to bed) without RW.  No rest breaks needed during the tasks as pt was anxious to just get them done.   She then sat on bed and immediately layed down. She said she was done moving around at this point.  Spent time discussing how she and her sister do meal prep. Pt is concerned she will be a burden on her sister, but reassured pt that she is only S and she can help her sister with home management tasks of cleaning and cooking.   Family will come for her next session of OT to observe pt's mobility in the kitchen and with light housekeeping.   Pt resting in bed with alarm set and all needs met.    Therapy Documentation Precautions:  Precautions Precautions: Fall Precaution Comments: goal SBP<140 Restrictions Weight Bearing Restrictions: No General: General OT Amount of Missed Time: 10  Minutes Vital Signs: Therapy Vitals Temp: 98.6 F (37 C) Temp Source: Oral Pulse Rate: (!) 59 Resp: 18 BP: (!) 133/54 Patient Position (if appropriate): Sitting Oxygen Therapy SpO2: 99 % O2 Device: Room Air Pain: Pain Assessment Pain Scale: 0-10 Pain Score: 10-Worst pain ever Pain Type: Acute pain Pain Location: Back(head) Pain Descriptors / Indicators: Aching Pain Frequency: Constant Pain Onset: On-going Patients Stated Pain Goal: 3 Pain Intervention(s): Medication (See eMAR) ADL: ADL Eating: Independent Where Assessed-Eating: Bed level Grooming: Independent Where Assessed-Grooming: Standing at sink Upper Body Bathing: Setup Where Assessed-Upper Body Bathing: Shower Lower Body Bathing: Supervision/safety Where Assessed-Lower Body Bathing: Shower Upper Body Dressing: Setup Where Assessed-Upper Body Dressing: Chair Lower Body Dressing: Supervision/safety Where Assessed-Lower Body Dressing: Chair Toileting: Supervision/safety Where Assessed-Toileting: Glass blower/designer: Close supervision Armed forces technical officer Method: Counselling psychologist: Geophysical data processor: Close supervision Social research officer, government Method: Heritage manager: Radio broadcast assistant ADL Comments: pt can do much of her self care in standing   Therapy/Group: Individual Therapy  [place] 02/06/2020, 9:48 AM

## 2020-02-06 NOTE — Consult Note (Signed)
Referring Physician: Marissa Nestle PA  Patient name: Autumn Le MRN: [id number] DOB: 09-15-1946 Sex: female  REASON FOR CONSULT: Abdominal aortic aneurysm  HPI: Autumn Le is a 74 y.o. female, with incidental finding of 4.6 cm abdominal aortic aneurysm on x-ray of lumbar spine for chronic back pain.  Patient was admitted to the hospital for treatment of an intracranial aneurysm.  She is currently in rehab.  She is still requiring a fair amount of assistance to ambulate.  She thinks she may be going home at some point next week.  She did not know she had an aneurysm in the past.  She does not recall any family history of abdominal aortic aneurysm.  She has some memory deficits during this hospitalization.  However, other than general total body weakness she does not have any focal upper or lower extremity motor weakness.  Other medical problems include COPD degenerative joint disease of the cervical spine and occasional vertigo.  These are all currently stable.  Past Medical History:  Diagnosis Date  . COPD (chronic obstructive pulmonary disease) (HCC)   . Degenerative joint disease of cervical spine   . Meniere's disease in remission, bilateral   . Osteopenia   . Vertigo    Past Surgical History:  Procedure Laterality Date  . IR ANGIO INTRA EXTRACRAN SEL INTERNAL CAROTID BILAT MOD SED  01/23/2020  . IR ANGIO VERTEBRAL SEL VERTEBRAL UNI L MOD SED  01/23/2020  . IR ANGIOGRAM FOLLOW UP STUDY  01/23/2020  . IR ANGIOGRAM FOLLOW UP STUDY  01/23/2020  . IR ANGIOGRAM FOLLOW UP STUDY  01/23/2020  . IR ANGIOGRAM FOLLOW UP STUDY  01/23/2020  . IR TRANSCATH/EMBOLIZ  01/23/2020  . RADIOLOGY WITH ANESTHESIA N/A 01/23/2020   Procedure: RADIOLOGY WITH ANESTHESIA;  Surgeon: Lisbeth Renshaw, MD;  Location: [facility] OR;  Service: Radiology;  Laterality: N/A;  . TONSILLECTOMY      Family History  Problem Relation Age of Onset  . Breast cancer Maternal Aunt     SOCIAL HISTORY: Social History    Socioeconomic History  . Marital status: Single    Spouse name: Not on file  . Number of children: Not on file  . Years of education: Not on file  . Highest education level: Not on file  Occupational History  . Not on file  Tobacco Use  . Smoking status: Current Every Day Smoker  . Smokeless tobacco: Never Used  Substance and Sexual Activity  . Alcohol use: Not Currently  . Drug use: Not Currently  . Sexual activity: Not on file  Other Topics Concern  . Not on file  Social History Narrative  . Not on file   Social Determinants of Health   Financial Resource Strain:   . Difficulty of Paying Living Expenses:   Food Insecurity:   . Worried About Programme researcher, broadcasting/film/video in the Last Year:   . Barista in the Last Year:   Transportation Needs:   . Freight forwarder (Medical):   Marland Kitchen Lack of Transportation (Non-Medical):   Physical Activity:   . Days of Exercise per Week:   . Minutes of Exercise per Session:   Stress:   . Feeling of Stress :   Social Connections:   . Frequency of Communication with Friends and Family:   . Frequency of Social Gatherings with Friends and Family:   . Attends Religious Services:   . Active Member of Clubs or Organizations:   . Attends Club or  Organization Meetings:   Marland Kitchen Marital Status:   Intimate Partner Violence:   . Fear of Current or Ex-Partner:   . Emotionally Abused:   Marland Kitchen Physically Abused:   . Sexually Abused:     Allergies  Allergen Reactions  . Latex Hives    Current Facility-Administered Medications  Medication Dose Route Frequency Provider Last Rate Last Admin  . acetaminophen (TYLENOL) tablet 650 mg  650 mg Oral Q8H Raulkar, Clide Deutscher, MD   650 mg at 02/06/20 1455  . albuterol (PROVENTIL) (2.5 MG/3ML) 0.083% nebulizer solution 2.5 mg  2.5 mg Nebulization Q6H PRN Love, Pamela S, PA-C      . alum & mag hydroxide-simeth (MAALOX/MYLANTA) 200-200-20 MG/5ML suspension 30 mL  30 mL Oral Q4H PRN Love, Pamela S, PA-C      .  amLODipine (NORVASC) tablet 5 mg  5 mg Oral Daily Raulkar, Clide Deutscher, MD   5 mg at 02/06/20 0834  . bisacodyl (DULCOLAX) suppository 10 mg  10 mg Rectal Daily PRN Love, Pamela S, PA-C      . butalbital-acetaminophen-caffeine (FIORICET) 50-325-40 MG per tablet 1-2 tablet  1-2 tablet Oral Q4H PRN Bary Leriche, PA-C   2 tablet at 02/06/20 1244  . Chlorhexidine Gluconate Cloth 2 % PADS 6 each  6 each Topical Daily Bary Leriche, PA-C   6 each at 02/05/20 1738  . cholecalciferol (VITAMIN D3) tablet 2,000 Units  2,000 Units Oral QHS Bary Leriche, PA-C   2,000 Units at 02/05/20 2148  . dexamethasone (DECADRON) tablet 4 mg  4 mg Oral BID WC LoveIvan Anchors, PA-C   4 mg at 02/06/20 8144  . diclofenac Sodium (VOLTAREN) 1 % topical gel 2 g  2 g Topical QID Raulkar, Clide Deutscher, MD   2 g at 02/06/20 1245  . diphenhydrAMINE (BENADRYL) 12.5 MG/5ML elixir 12.5-25 mg  12.5-25 mg Oral Q6H PRN Love, Pamela S, PA-C      . fluticasone (FLONASE) 50 MCG/ACT nasal spray 2 spray  2 spray Each Nare Daily Bary Leriche, PA-C   2 spray at 02/03/20 2050  . fluticasone furoate-vilanterol (BREO ELLIPTA) 100-25 MCG/INH 1 puff  1 puff Inhalation Daily Bary Leriche, PA-C   1 puff at 02/06/20 [phone]  . guaiFENesin-dextromethorphan (ROBITUSSIN DM) 100-10 MG/5ML syrup 5-10 mL  5-10 mL Oral Q6H PRN Love, Ivan Anchors, PA-C      . HYDROcodone-acetaminophen (NORCO/VICODIN) 5-325 MG per tablet 1 tablet  1 tablet Oral Q6H PRN Charlett Blake, MD   1 tablet at 02/06/20 [phone]  . loratadine (CLARITIN) tablet 10 mg  10 mg Oral QHS Bary Leriche, PA-C   10 mg at 02/05/20 2150  . magnesium hydroxide (MILK OF MAGNESIA) suspension 30 mL  30 mL Oral Daily PRN Bary Leriche, PA-C   30 mL at 02/04/20 2050  . methocarbamol (ROBAXIN) tablet 500 mg  500 mg Oral Q6H PRN Bary Leriche, PA-C   500 mg at 02/03/20 1604  . Muscle Rub CREA   Topical BID PRN Charlett Blake, MD   Given at 02/03/20 1315  . niMODipine (NIMOTOP) capsule 60 mg  60 mg Oral  Q4H Kirsteins, Luanna Salk, MD   60 mg at 02/06/20 1245   Or  . niMODipine (NYMALIZE) 6 MG/ML oral solution 60 mg  60 mg Per Tube Q4H Kirsteins, Luanna Salk, MD      . ondansetron (ZOFRAN-ODT) disintegrating tablet 4 mg  4 mg Oral Q6H PRN Bary Leriche, PA-C  4 mg at 02/03/20 0901   Or  . ondansetron (ZOFRAN) injection 4 mg  4 mg Intravenous Q6H PRN Love, Pamela S, PA-C      . pantoprazole (PROTONIX) EC tablet 40 mg  40 mg Oral Daily Jacquelynn Cree, PA-C   40 mg at 02/06/20 0834  . polyethylene glycol (MIRALAX / GLYCOLAX) packet 17 g  17 g Oral BID Jacquelynn Cree, PA-C   17 g at 02/06/20 0834  . potassium chloride (KLOR-CON) CR tablet 30 mEq  30 mEq Oral BID Erick Colace, MD   30 mEq at 02/06/20 0834  . prochlorperazine (COMPAZINE) tablet 5-10 mg  5-10 mg Oral Q6H PRN Love, Pamela S, PA-C       Or  . prochlorperazine (COMPAZINE) injection 5-10 mg  5-10 mg Intramuscular Q6H PRN Love, Pamela S, PA-C       Or  . prochlorperazine (COMPAZINE) suppository 12.5 mg  12.5 mg Rectal Q6H PRN Love, Pamela S, PA-C      . sodium phosphate (FLEET) 7-19 GM/118ML enema 1 enema  1 enema Rectal Once PRN Love, Pamela S, PA-C      . topiramate (TOPAMAX) tablet 25 mg  25 mg Oral QHS Love, Pamela S, PA-C   25 mg at 02/05/20 1956  . traMADol (ULTRAM) tablet 50 mg  50 mg Oral Q6H PRN Erick Colace, MD   50 mg at 02/05/20 1745  . traZODone (DESYREL) tablet 25-50 mg  25-50 mg Oral QHS PRN Love, Pamela S, PA-C        ROS:   General:  No weight loss, Fever, chills  HEENT: No recent headaches, no nasal bleeding, no visual changes, no sore throat  Neurologic: No dizziness, blackouts, seizures. No recent symptoms of stroke or mini- stroke. No recent episodes of slurred speech, or temporary blindness.  Cardiac: No recent episodes of chest pain/pressure, no shortness of breath at rest.  No shortness of breath with exertion.  Denies history of atrial fibrillation or irregular heartbeat  Vascular: No history of  rest pain in feet.  No history of claudication.  No history of non-healing ulcer, No history of DVT   Pulmonary: No home oxygen, no productive cough, no hemoptysis,  No asthma or wheezing  Musculoskeletal:  [X]  Arthritis, [X]  Low back pain,  [X]  Joint pain  Hematologic:No history of hypercoagulable state.  No history of easy bleeding.  No history of anemia  Gastrointestinal: No hematochezia or melena,  No gastroesophageal reflux, no trouble swallowing  Urinary: [ ]  chronic Kidney disease, [ ]  on HD - [ ]  MWF or [ ]  TTHS, [ ]  Burning with urination, [ ]  Frequent urination, [ ]  Difficulty urinating;   Skin: No rashes  Psychological: No history of anxiety,  No history of depression   Physical Examination  Vitals:   02/05/20 1949 02/06/20 0552 02/06/20 0738 02/06/20 0811  BP: (!) 133/58 (!) 144/58  (!) 133/54  Pulse: 62 62  (!) 59  Resp: 16 18    Temp: 98.7 F (37.1 C) 98.6 F (37 C)    TempSrc:  Oral    SpO2: 98% 96% 99%   Weight:      Height:        Body mass index is 21.34 kg/m.  General:  Alert and oriented, no acute distress HEENT: Normal Neck: No JVD Pulmonary: Clear to auscultation bilaterally Cardiac: Regular Rate and Rhythm  Abdomen: Soft, non-tender, non-distended, no mass Skin: No rash Extremity Pulses:  2+ radial, brachial,  femoral, 1+ dorsalis pedis, absent posterior tibial pulses bilaterally Musculoskeletal: No deformity or edema  Neurologic: Upper and lower extremity motor 5/5 and symmetric  DATA:  CBC    Component Value Date/Time   WBC 13.0 (H) 02/01/2020 0541   RBC 3.33 (L) 02/01/2020 0541   HGB 10.6 (L) 02/01/2020 0541   HCT 31.4 (L) 02/01/2020 0541   PLT 320 02/01/2020 0541   MCV 94.3 02/01/2020 0541   MCH 31.8 02/01/2020 0541   MCHC 33.8 02/01/2020 0541   RDW 13.7 02/01/2020 0541   LYMPHSABS 1.9 02/01/2020 0541   MONOABS 1.0 02/01/2020 0541   EOSABS 0.1 02/01/2020 0541   BASOSABS 0.1 02/01/2020 0541    BMET    Component Value  Date/Time   NA 140 02/06/2020 0526   K 4.5 02/06/2020 0526   CL 110 02/06/2020 0526   CO2 21 (L) 02/06/2020 0526   GLUCOSE 108 (H) 02/06/2020 0526   BUN 10 02/06/2020 0526   CREATININE 0.52 02/06/2020 0526   CALCIUM 9.3 02/06/2020 0526   GFRNONAA >60 02/06/2020 0526   GFRAA >60 02/06/2020 0526     ASSESSMENT: Abdominal aortic aneurysm 4.6 cm diameter currently asymptomatic   PLAN: Patient needs follow-up appointment in our APP clinic as well as aortic ultrasound in 6 months time  We will arrange this.  Would consider repair if aneurysm diameter reaches 5-5.5 centimeters.   Fabienne Bruns, MD [facility] Office: [phone] Pager: [phone]

## 2020-02-06 NOTE — Progress Notes (Signed)
Physical Therapy Session Note  Patient Details  Name: Autumn Le MRN: [id number] Date of Birth: 1946/08/08  Today's Date: 02/06/2020 PT Individual Time: 3086-5784 PT Individual Time Calculation (min): 50 min   Short Term Goals: Week 1:  PT Short Term Goal 1 (Week 1): STG=LTG due to short ELOS.  Skilled Therapeutic Interventions/Progress Updates:   Pt received supine in bed and agreeable to therapy session. Pt perseverating on the fact that she does not want to be a burden to her sister and does not want to "add to her plate" as she states her sister's "plate is full by having to take care of her husband who has Alzheimer's." Therapist educated pt on her primary need for safety and cognitive support upon discharge as she does not require very much physical assistance. Pt then started perseverating on her dizziness symptoms and the fact that she has been increasing her water/Pedialyte intake as the doctor told her it would help decrease her dizziness - pt reports she has not had a dizziness episode today. Pt continues to have hyperverbosity throughout session requiring frequent redirecting. Supine>sit, HOB partially elevated and using bedrails, with supervision. Sit<>stands using RW with close supervision for safety throughout session - pt continues to put both hands on RW when coming to stand despite cuing. Gait ~168ft x2 to/from therapy gym using RW with close supervision for safety - demonstrates decreased gait speed due to decreased speed of movement as pt internally distracted with conversation. Ascended/descended 4 steps using R HR only and HHA, to replicate her sister's home entrance, with step-to pattern and pt recalling proper sequencing of up with R LE and down with L LE via min cuing - CGA for steadying. Performed standing balance and cognitive task while on airex to match ping pong ball patterns based on a picture - required significantly increased time but able to complete with min cuing  and CGA/close supervision for safety. Ambulated back to room and pt reports need to toilet. Sit<>stand RW<>toilet using grab bars as needed with close supervision. Continent of bowels and bladder, performed standing LB clothing management, and seated peri-care with close supervision for balance safety. Standing hand hygiene at sink with max cuing for proper AD management at a counter as pt placed it off to the side - close supervision for safety. Sit>supine using bed features with supervision. Pt left supine in bed with needs in reach and bed alarm on.  Therapy Documentation Precautions:  Precautions Precautions: Fall Precaution Comments: goal SBP<140 Restrictions Weight Bearing Restrictions: No  Pain: No reports of pain throughout session.   Therapy/Group: Individual Therapy  Ginny Forth, PT, DPT 02/06/2020, 5:49 PM

## 2020-02-07 ENCOUNTER — Inpatient Hospital Stay (HOSPITAL_COMMUNITY): Payer: Medicare HMO | Admitting: Speech Pathology

## 2020-02-07 DIAGNOSIS — D72829 Elevated white blood cell count, unspecified: Secondary | ICD-10-CM

## 2020-02-07 DIAGNOSIS — I714 Abdominal aortic aneurysm, without rupture, unspecified: Secondary | ICD-10-CM

## 2020-02-07 DIAGNOSIS — D62 Acute posthemorrhagic anemia: Secondary | ICD-10-CM

## 2020-02-07 DIAGNOSIS — I10 Essential (primary) hypertension: Secondary | ICD-10-CM

## 2020-02-07 DIAGNOSIS — E876 Hypokalemia: Secondary | ICD-10-CM

## 2020-02-07 NOTE — Progress Notes (Signed)
[place] PHYSICAL MEDICINE & REHABILITATION PROGRESS NOTE   Subjective/Complaints: Patient seen sitting up at the edge of her bed this morning.  She states she slept fairly well overnight.  She was seen by vascular yesterday with plans for follow-up as outpatient.  She is frustrated and agitated with medications.  ROS: Denies CP, SOB, N/V/D  Objective:   No results found. No results for input(s): WBC, HGB, HCT, PLT in the last 72 hours. Recent Labs    02/06/20 0526  NA 140  K 4.5  CL 110  CO2 21*  GLUCOSE 108*  BUN 10  CREATININE 0.52  CALCIUM 9.3    Intake/Output Summary (Last 24 hours) at 02/07/2020 0942 Last data filed at 02/07/2020 0750 Gross per 24 hour  Intake 240 ml  Output -  Net 240 ml     Physical Exam: Vital Signs Blood pressure 119/65, pulse 81, temperature 98.1 F (36.7 C), temperature source Oral, resp. rate 18, height 5\' 4"  (1.626 m), weight 56.4 kg, SpO2 98 %. Constitutional: No distress . Vital signs reviewed. HENT: Normocephalic.  Atraumatic. Eyes: EOMI. No discharge. Cardiovascular: No JVD. Respiratory: Normal effort.  No stridor. GI: Non-distended. Skin: Warm and dry.  Intact. Psych: Agitated. Musc: No edema in extremities.  No tenderness in extremities. Neurologic: Alert Motor: Grossly 4+-5/5 throughout   Assessment/Plan: 1. Functional deficits secondary to [facility]  which require 3+ hours per day of interdisciplinary therapy in a comprehensive inpatient rehab setting.  Physiatrist is providing close team supervision and 24 hour management of active medical problems listed below.  Physiatrist and rehab team continue to assess barriers to discharge/monitor patient progress toward functional and medical goals  Care Tool:  Bathing  Bathing activity did not occur: Refused Body parts bathed by patient: Right arm, Left arm, Chest, Abdomen, Front perineal area, Buttocks, Right upper leg, Left upper leg, Right lower leg, Left lower leg, Face         Bathing assist Assist Level: Supervision/Verbal cueing     Upper Body Dressing/Undressing Upper body dressing Upper body dressing/undressing activity did not occur (including orthotics): Refused What is the patient wearing?: Bra, Pull over shirt    Upper body assist Assist Level: Set up assist    Lower Body Dressing/Undressing Lower body dressing    Lower body dressing activity did not occur: Refused What is the patient wearing?: Underwear/pull up, Pants     Lower body assist Assist for lower body dressing: Supervision/Verbal cueing Assistive Device Comment: walker   Toileting Toileting    Toileting assist Assist for toileting: Supervision/Verbal cueing     Transfers Chair/bed transfer  Transfers assist     Chair/bed transfer assist level: Supervision/Verbal cueing Chair/bed transfer assistive device: [facility]   Ambulation assist   Ambulation activity did not occur: Safety/medical concerns(pt with low K+ on eval, limited to bedside and transfers in the room per MD)  Assist level: Supervision/Verbal cueing Assistive device: Walker-rolling Max distance: 13ft   Walk 10 feet activity   Assist  Walk 10 feet activity did not occur: Safety/medical concerns  Assist level: Supervision/Verbal cueing Assistive device: Walker-rolling   Walk 50 feet activity   Assist Walk 50 feet with 2 turns activity did not occur: Safety/medical concerns  Assist level: Supervision/Verbal cueing Assistive device: Walker-rolling    Walk 150 feet activity   Assist Walk 150 feet activity did not occur: Safety/medical concerns  Assist level: Supervision/Verbal cueing Assistive device: Walker-rolling    Walk 10 feet on uneven surface  activity   Assist Walk 10 feet on uneven surfaces activity did not occur: Safety/medical concerns(pt with low K+ on eval, limited to bedside and transfers in the room per MD)   Assist level: Contact  Guard/Touching assist Assistive device: Teacher, English as a foreign language activity did not occur: Safety/medical concerns(pt with low K+ on eval, limited to bedside and transfers in the room per MD)         Wheelchair 50 feet with 2 turns activity    Assist    Wheelchair 50 feet with 2 turns activity did not occur: Safety/medical concerns       Wheelchair 150 feet activity     Assist  Wheelchair 150 feet activity did not occur: Safety/medical concerns       Blood pressure 119/65, pulse 81, temperature 98.1 F (36.7 C), temperature source Oral, resp. rate 18, height 5\' 4"  (1.626 m), weight 56.4 kg, SpO2 98 %.  Medical Problem List and Plan: 1. Decline in cognition and mobility secondary to Right SAH  Per neurosurgery, likely has meningeal irritation from subarachnoid blood causing her low back and extremity symptomatology.  Restarted dexamethasone for about 1 week 4 mg twice daily over the weekend and then will taper to 2 mg upon discharge to finish the course  Continue CIR 2. Antithrombotics: -DVT/anticoagulation:Mechanical:Sequential compression devices, below kneeBilateral lower extremities -antiplatelet therapy: N/a 3.Headaches/Pain Management:  As per Neurosurgery back leg and HA pain likely due to subarachnoid blood from aneurysm rupture, restarted steroids for several days No further imaging needed at this time   Added Topamax and titrate slowly.   Added K pad for shoulder pain and continue robaxin prnfor shoulder pain. D/C ibuprofen.   Changed hydrocodone to oxycodone and monitor.  Added diclofenac gel and schedule tylenol.   Avoid side lying with HOB elevated, add sportscreme, trial tramadol for moderate pain, hydrocodone for more severe pain  Relatively controlled with medication on 3/27 4. Mood:LCSW to follow for evaluation and support. -antipsychotic agents: N/A 5. Neuropsych: This  patientiscapable of making decisions on herown behalf. 6. Skin/Wound Care:Routine pressure relief measures. 7. Fluids/Electrolytes/Nutrition:Monitor I/O. 8. H/o Meniere's disease:In remission. 9. COPD:Continue Breo.  Resumed claritin. 10. Nausea/dizziness: Vestibular evaluation/tx to help manage symptoms.   Improving 11. HTN: Monitor BP tid--goal <140. On Nimotop started on 3/14-->end date 4/4.Marland Kitchen  Added amlodipine 5mg .   Relatively controlled on 3/27 12. Leucocytosis: Monitor for signs of infection.   May rise with addition of steroids.  WBC 13.0 on 3/21  Afebrile  13.  Hypokalemia  K+ 4.5 on 3/26  Changed KCL to 78meq BID, labs ordered for Monday 14.  Acute blood loss anemia  Hemoglobin 10.6 on 3/21  Continue to monitor 15.  AAA  Seen by vascular yesterday, notes reviewed-plan for outpatient follow-up with repeat ultrasound in 6 months, possible repair pending results  LOS: 7 days A FACE TO FACE EVALUATION WAS PERFORMED  Ankit Lorie Phenix 02/07/2020, 9:42 AM

## 2020-02-07 NOTE — Progress Notes (Signed)
Physical Therapy Session Note  Patient Details  Name: Autumn Le MRN: [id number] Date of Birth: Apr 08, 1946  Today's Date: 02/07/2020 PT Individual Time: 1535-1630 PT Individual Time Calculation (min): 55 min   Short Term Goals: Week 1:  PT Short Term Goal 1 (Week 1): STG=LTG due to short ELOS.  Skilled Therapeutic Interventions/Progress Updates:   Pt received supine in bed and agreeable to PT for make up session. Supine>sit transfer with supervision assist for safety. Stand pivot transfer to [facility] with RW. Pt transported to rehab gym. Gait training with RW x 184f with supervision assist only min cues for AD management in turns. Dynamic balance training to navigate obstacle course with RW, stepping over 6 inch curb step, over 1 inch canes in floor and around 2 cones. Completed x 2 with supervision assist and min cues for AD management over step and in turns. Pt also engaged in fine motor task of conncet 4 while standing on blue airex pad. Able to sustain balance with supervision assist and 1 UE support, but unable to grasp concept of game, regardless of multiple redirectional and explinational instructions from PT. Nustep reciprocal movement and endurance training x 6 min with cues for full ROM and increased speed as tolerated.  Ambulatory transfer to and from toilet with supervision assist and RW, supervision assist for hand hygiene wqith cues for AD placement to improve safety. Pt returned to room and performed ambulatory transfer to bed without AD and Supervision assist. Sit>supine completed without assist, and left supine in bed with call bell in reach and all needs met.        Therapy Documentation Precautions:  Precautions Precautions: Fall Precaution Comments: goal SBP<140 Restrictions Weight Bearing Restrictions: No    Vital Signs: Therapy Vitals Pulse Rate: 65 BP: 130/63 Patient Position (if appropriate): Lying Oxygen Therapy SpO2: 100 % O2 Device: Room Air Pain: Denies     Therapy/Group: Individual Therapy  [name]3/27/2021, 4:39 PM

## 2020-02-07 NOTE — Progress Notes (Signed)
Speech Language Pathology Daily Session Note  Patient Details  Name: Autumn Le MRN: [id number] Date of Birth: 1946/03/26  Today's Date: 02/07/2020 SLP Individual Time: 1400-1425 SLP Individual Time Calculation (min): 25 min  Short Term Goals: Week 1: SLP Short Term Goal 1 (Week 1): STG=LTG due to ELOS  Skilled Therapeutic Interventions: Skilled treatment session focused on cognitive goals. SLP facilitated session by providing total A for her current medications and their functions. Patient kept reporting, "I have no memories." Patient perseverative on complaints about firmness of furniture, etc and the amount of stress her sister is under caring for her husband despite Max cues for redirection. Patient left upright in bed with alarm on and all needs within reach. Continue with current plan of care.       Pain No/Denies Pain   Therapy/Group: Individual Therapy  Parveen Freehling 02/07/2020, 3:14 PM

## 2020-02-08 ENCOUNTER — Inpatient Hospital Stay (HOSPITAL_COMMUNITY): Payer: Medicare HMO | Admitting: Physical Therapy

## 2020-02-08 ENCOUNTER — Inpatient Hospital Stay (HOSPITAL_COMMUNITY): Payer: Medicare HMO

## 2020-02-08 DIAGNOSIS — I952 Hypotension due to drugs: Secondary | ICD-10-CM

## 2020-02-08 LAB — SARS CORONAVIRUS 2 (TAT 6-24 HRS): SARS Coronavirus 2: NEGATIVE

## 2020-02-08 NOTE — Progress Notes (Signed)
Pt refused several medications this evening including 20 meq potassium chloride, topamax, 325 mg out of 650 mg of scheduled tylenol dose, claritin, and vitamin D3. Pt was educated on risks of refusing medication.

## 2020-02-08 NOTE — Progress Notes (Signed)
Physical Therapy Session Note  Patient Details  Name: Autumn Le MRN: [id number] Date of Birth: 1946/04/27  Today's Date: 02/08/2020 PT Individual Time: 1105-1200 PT Individual Time Calculation (min): 55 min   Short Term Goals: Week 1:  PT Short Term Goal 1 (Week 1): STG=LTG due to short ELOS.  Skilled Therapeutic Interventions/Progress Updates: Pt presented in bed agreeable to therapy. PT not asked about pain so pt would not perseverate on it. Performed bed mobility with supervision and use of bed features. Pt ambulated to toilet supervision level and performed toilet transfers supervision (+void). Pt ambulated to sink and performed hand hygiene with supervision. Pt ambulated to rehab gym with RW CGA due to increased lateral sway noted. Pt participated in ball catch against rebounder x 10, placing horseshoes on basketball ring x 10 on non-compliant and compliant surfaces without AD. Pt able to perform all activities with CGA. Participated in pipe tree activity while standing on Airex with pt requiring mod/maxA for using correct pieces and sustaining task due to being internally distracted. Once completed pt indicated increased nausea/dizziness, nsg notified. With increased time pt performed ambulatory transfer to w/c and pt transported to day room. Pt participated in NuStep L4 x 5 min with pt participated at significantly decreased speed. Pt returned to w/c in same manner as prior and transported back to room. Performed ambulatory transfer to toilet supervision with RW and performed toilet transfers supervision (+void). After completing hand hygiene at sink with supervision pt ambulated back to bed and returned to bed superversion. Pt left in bed at end of session with bed alarm on, call bell within reach and needs met.      Therapy Documentation Precautions:  Precautions Precautions: Fall Precaution Comments: goal SBP<140 Restrictions Weight Bearing Restrictions: No General:   Vital  Signs: Therapy Vitals Temp: 99.1 F (37.3 C) Pulse Rate: 67 Resp: 17 BP: (!) 122/58 Patient Position (if appropriate): Lying Oxygen Therapy SpO2: 98 % O2 Device: Room Air   Therapy/Group: Individual Therapy  Kindle Strohmeier  [place] Esp[place], PTA  02/08/2020, 4:16 PM

## 2020-02-08 NOTE — Progress Notes (Signed)
[place] PHYSICAL MEDICINE & REHABILITATION PROGRESS NOTE   Subjective/Complaints: Patient seen laying in bed this morning.  She states she slept well overnight.  She is more calm this morning.  She states she needs to the restroom.  ROS: Denies CP, SOB, N/V/D  Objective:   No results found. No results for input(s): WBC, HGB, HCT, PLT in the last 72 hours. Recent Labs    02/06/20 0526  NA 140  K 4.5  CL 110  CO2 21*  GLUCOSE 108*  BUN 10  CREATININE 0.52  CALCIUM 9.3    Intake/Output Summary (Last 24 hours) at 02/08/2020 0857 Last data filed at 02/07/2020 1700 Gross per 24 hour  Intake 120 ml  Output -  Net 120 ml     Physical Exam: Vital Signs Blood pressure (!) 86/72, pulse 85, temperature 97.9 F (36.6 C), temperature source Oral, resp. rate 18, height 5\' 4"  (1.626 m), weight 56.4 kg, SpO2 98 %. Constitutional: No distress . Vital signs reviewed. HENT: Normocephalic.  Atraumatic. Eyes: EOMI. No discharge. Cardiovascular: No JVD. Respiratory: Normal effort.  No stridor. GI: Non-distended. Skin: Warm and dry.  Intact. Psych: Normal mood.  Normal behavior. Musc: No edema in extremities.  No tenderness in extremities. Neurologic: Alert Motor: Grossly 4+-5/5 throughout, unchanged  Assessment/Plan: 1. Functional deficits secondary to [facility]  which require 3+ hours per day of interdisciplinary therapy in a comprehensive inpatient rehab setting.  Physiatrist is providing close team supervision and 24 hour management of active medical problems listed below.  Physiatrist and rehab team continue to assess barriers to discharge/monitor patient progress toward functional and medical goals  Care Tool:  Bathing  Bathing activity did not occur: Refused Body parts bathed by patient: Right arm, Left arm, Chest, Abdomen, Front perineal area, Buttocks, Right upper leg, Left upper leg, Right lower leg, Left lower leg, Face         Bathing assist Assist Level:  Supervision/Verbal cueing     Upper Body Dressing/Undressing Upper body dressing Upper body dressing/undressing activity did not occur (including orthotics): Refused What is the patient wearing?: Bra, Pull over shirt    Upper body assist Assist Level: Set up assist    Lower Body Dressing/Undressing Lower body dressing    Lower body dressing activity did not occur: Refused What is the patient wearing?: Underwear/pull up, Pants     Lower body assist Assist for lower body dressing: Supervision/Verbal cueing Assistive Device Comment: walker   Toileting Toileting    Toileting assist Assist for toileting: Supervision/Verbal cueing     Transfers Chair/bed transfer  Transfers assist     Chair/bed transfer assist level: Supervision/Verbal cueing Chair/bed transfer assistive device: Programmer, multimedia   Ambulation assist   Ambulation activity did not occur: Safety/medical concerns(pt with low K+ on eval, limited to bedside and transfers in the room per MD)  Assist level: Supervision/Verbal cueing Assistive device: Walker-rolling Max distance: 136ft   Walk 10 feet activity   Assist  Walk 10 feet activity did not occur: Safety/medical concerns  Assist level: Supervision/Verbal cueing Assistive device: Walker-rolling   Walk 50 feet activity   Assist Walk 50 feet with 2 turns activity did not occur: Safety/medical concerns  Assist level: Supervision/Verbal cueing Assistive device: Walker-rolling    Walk 150 feet activity   Assist Walk 150 feet activity did not occur: Safety/medical concerns  Assist level: Supervision/Verbal cueing Assistive device: Walker-rolling    Walk 10 feet on uneven surface  activity   Assist Walk  10 feet on uneven surfaces activity did not occur: Safety/medical concerns(pt with low K+ on eval, limited to bedside and transfers in the room per MD)   Assist level: Contact Guard/Touching assist Assistive device:  Loss adjuster, chartered activity did not occur: Safety/medical concerns(pt with low K+ on eval, limited to bedside and transfers in the room per MD)         Wheelchair 50 feet with 2 turns activity    Assist    Wheelchair 50 feet with 2 turns activity did not occur: Safety/medical concerns       Wheelchair 150 feet activity     Assist  Wheelchair 150 feet activity did not occur: Safety/medical concerns       Blood pressure (!) 86/72, pulse 85, temperature 97.9 F (36.6 C), temperature source Oral, resp. rate 18, height 5\' 4"  (1.626 m), weight 56.4 kg, SpO2 98 %.  Medical Problem List and Plan: 1. Decline in cognition and mobility secondary to Right SAH  Per neurosurgery, likely has meningeal irritation from subarachnoid blood causing her low back and extremity symptomatology.  Restarted dexamethasone for about 1 week 4 mg twice daily over the weekend and then will taper to 2 mg upon discharge to finish the course  Continue CIR 2. Antithrombotics: -DVT/anticoagulation:Mechanical:Sequential compression devices, below kneeBilateral lower extremities -antiplatelet therapy: N/a 3.Headaches/Pain Management:  As per Neurosurgery back leg and HA pain likely due to subarachnoid blood from aneurysm rupture, restarted steroids for several days No further imaging needed at this time   Added Topamax and titrate slowly.   Added K pad for shoulder pain and continue robaxin prnfor shoulder pain. D/C ibuprofen.   Changed hydrocodone to oxycodone and monitor.  Added diclofenac gel and schedule tylenol.   Avoid side lying with HOB elevated, add sportscreme, trial tramadol for moderate pain, hydrocodone for more severe pain  Relatively controlled with medication on 3/28 4. Mood:LCSW to follow for evaluation and support. -antipsychotic agents: N/A 5. Neuropsych: This patientiscapable of making decisions on herown  behalf. 6. Skin/Wound Care:Routine pressure relief measures. 7. Fluids/Electrolytes/Nutrition:Monitor I/O. 8. H/o Meniere's disease:In remission. 9. COPD:Continue Breo.  Resumed claritin. 10. Nausea/dizziness: Vestibular evaluation/tx to help manage symptoms.   Improving 11. HTN: Monitor BP tid--goal <140. On Nimotop started on 3/14-->end date 4/4.6/4  Added amlodipine 5mg .   Relatively controlled, however hypotension this morning, consider decreasing meds if persistent 12. Leucocytosis: Monitor for signs of infection.   May rise with addition of steroids.  WBC 13.0 on 3/21, labs ordered for tomorrow  Afebrile  13.  Hypokalemia  K+ 4.5 on 3/26  Changed KCL to 4/21 BID, labs ordered for tomorrow 14.  Acute blood loss anemia  Hemoglobin 10.6 on 3/21, labs ordered for tomorrow  Continue to monitor 15.  AAA  Seen by vascular yesterday, notes reviewed-plan for outpatient follow-up with repeat ultrasound in 6 months, possible repair pending results  LOS: 8 days A FACE TO FACE EVALUATION WAS PERFORMED  Autumn Le 02/08/2020, 8:57 AM

## 2020-02-09 ENCOUNTER — Inpatient Hospital Stay (HOSPITAL_COMMUNITY): Payer: Medicare HMO | Admitting: Occupational Therapy

## 2020-02-09 ENCOUNTER — Inpatient Hospital Stay (HOSPITAL_COMMUNITY): Payer: Medicare HMO | Admitting: Speech Pathology

## 2020-02-09 ENCOUNTER — Inpatient Hospital Stay (HOSPITAL_COMMUNITY): Payer: Medicare HMO

## 2020-02-09 LAB — BASIC METABOLIC PANEL
Anion gap: 11 (ref 5–15)
BUN: 18 mg/dL (ref 8–23)
CO2: 18 mmol/L — ABNORMAL LOW (ref 22–32)
Calcium: 9.8 mg/dL (ref 8.9–10.3)
Chloride: 104 mmol/L (ref 98–111)
Creatinine, Ser: 0.7 mg/dL (ref 0.44–1.00)
GFR calc Af Amer: >60 mL/min (ref >60)
GFR calc non Af Amer: >60 mL/min (ref >60)
Glucose, Bld: 114 mg/dL — ABNORMAL HIGH (ref 70–99)
Potassium: 4.2 mmol/L (ref 3.5–5.1)
Sodium: 133 mmol/L — ABNORMAL LOW (ref 135–145)

## 2020-02-09 LAB — CBC WITH DIFFERENTIAL/PLATELET
Abs Immature Granulocytes: 0.05 10*3/uL (ref 0.00–0.07)
Basophils Absolute: 0 10*3/uL (ref 0.0–0.1)
Basophils Relative: 0 %
Eosinophils Absolute: 0.1 10*3/uL (ref 0.0–0.5)
Eosinophils Relative: 1 %
HCT: 40.2 % (ref 36.0–46.0)
Hemoglobin: 12.8 g/dL (ref 12.0–15.0)
Immature Granulocytes: 1 %
Lymphocytes Relative: 20 %
Lymphs Abs: 2.2 10*3/uL (ref 0.7–4.0)
MCH: 31.9 pg (ref 26.0–34.0)
MCHC: 31.8 g/dL (ref 30.0–36.0)
MCV: 100.2 fL — ABNORMAL HIGH (ref 80.0–100.0)
Monocytes Absolute: 0.8 10*3/uL (ref 0.1–1.0)
Monocytes Relative: 7 %
Neutro Abs: 7.5 10*3/uL (ref 1.7–7.7)
Neutrophils Relative %: 71 %
Platelets: 409 10*3/uL — ABNORMAL HIGH (ref 150–400)
RBC: 4.01 MIL/uL (ref 3.87–5.11)
RDW: 14.2 % (ref 11.5–15.5)
WBC: 10.6 10*3/uL — ABNORMAL HIGH (ref 4.0–10.5)
nRBC: 0 % (ref 0.0–0.2)

## 2020-02-09 MED ORDER — DICLOFENAC SODIUM 1 % EX GEL: 2.0000 g | Freq: Four times a day (QID) | CUTANEOUS | Status: DC

## 2020-02-09 MED ORDER — AMLODIPINE BESYLATE 2.5 MG PO TABS
2.5000 mg | ORAL_TABLET | Freq: Every day | ORAL | Status: DC
  Administered 2020-02-09 – 2020-02-12 (×4): 2.5 mg via ORAL
  Filled 2020-02-09 (×4): qty 1

## 2020-02-09 MED ORDER — NIMODIPINE 30 MG PO CAPS: 60.0000 mg | ORAL_CAPSULE | ORAL | 0 refills | Status: DC

## 2020-02-09 MED ORDER — POTASSIUM CHLORIDE CRYS ER 15 MEQ PO TBCR: 30.0000 meq | EXTENDED_RELEASE_TABLET | Freq: Two times a day (BID) | ORAL | Status: DC

## 2020-02-09 MED ORDER — TRAMADOL HCL 50 MG PO TABS: 50.0000 mg | ORAL_TABLET | Freq: Four times a day (QID) | ORAL | 0 refills | Status: DC | PRN

## 2020-02-09 MED ORDER — TOPIRAMATE 25 MG PO TABS: 25.0000 mg | ORAL_TABLET | Freq: Every day | ORAL | Status: DC

## 2020-02-09 MED ORDER — AMLODIPINE BESYLATE 2.5 MG PO TABS: 2.5000 mg | ORAL_TABLET | Freq: Every day | ORAL | Status: DC

## 2020-02-09 MED ORDER — POLYETHYLENE GLYCOL 3350 17 G PO PACK: 17.0000 g | PACK | Freq: Two times a day (BID) | ORAL | 0 refills | Status: DC

## 2020-02-09 MED ORDER — PANTOPRAZOLE SODIUM 40 MG PO TBEC: 40.0000 mg | DELAYED_RELEASE_TABLET | Freq: Every day | ORAL | Status: DC

## 2020-02-09 MED ORDER — DEXAMETHASONE 4 MG PO TABS: ORAL_TABLET | ORAL | Status: DC

## 2020-02-09 MED ORDER — HYDROCODONE-ACETAMINOPHEN 5-325 MG PO TABS: 1.0000 | ORAL_TABLET | Freq: Three times a day (TID) | ORAL | 0 refills | Status: DC | PRN

## 2020-02-09 NOTE — Discharge Summary (Signed)
Occupational Therapy Discharge Summary  Patient Details  Name: Autumn Le MRN: [id number] Date of Birth: 1946/05/13   Patient has met 7 of 10 long term goals due to improved activity tolerance, improved balance and improved coordination.  Patient to discharge at overall Supervision-Mod I level.  Pt reports she will d/c to her sister 61 house who can provide the needed assistance at discharge.    Pt refused to engage in OOB tx during her last OT session, requiring supervision for 3 ADL areas when they were last scored by therapists. Therefore these goals were not met.    Recommendation:  Patient will benefit from ongoing skilled OT services in home health setting to continue to advance functional skills in the area of iADL.  Equipment: No equipment provided  Reasons for discharge: discharge from hospital  Patient/family agrees with progress made and goals achieved: Yes  OT Discharge Precautions/Restrictions  Precautions Precautions: Fall Restrictions Weight Bearing Restrictions: No Vital Signs Therapy Vitals Pulse Rate: 66 BP: (!) 155/71 Pain Pain Assessment Pain Scale: 0-10 Pain Score: 6  Pain Type: Acute pain Pain Location: Head Pain Orientation: Lower Pain Descriptors / Indicators: Aching Pain Frequency: Constant Pain Onset: On-going Patients Stated Pain Goal: 1 Pain Intervention(s): Medication (See eMAR) ADL ADL Eating: Independent Where Assessed-Eating: Bed level Grooming: Independent Where Assessed-Grooming: Standing at sink Upper Body Bathing: Setup Where Assessed-Upper Body Bathing: Shower Lower Body Bathing: Supervision/safety Where Assessed-Lower Body Bathing: Shower Upper Body Dressing: Setup Where Assessed-Upper Body Dressing: Chair Lower Body Dressing: Supervision/safety Where Assessed-Lower Body Dressing: Chair Toileting: Modified independent Where Assessed-Toileting: Risk analyst Method:  Ambulating(RW) Science writer: Geophysical data processor: Close supervision Social research officer, government Method: Ambulating(RW) Youth worker: Radio broadcast assistant ADL Comments: pt can do much of her self care in standing Cognition Overall Cognitive Status: Within Functional Limits for tasks assessed Arousal/Alertness: Awake/alert Orientation Level: Oriented X4 Sustained Attention: Impaired Memory: Impaired Problem Solving: Impaired Safety/Judgment: Appears intact  Mobility  Per staff report, pt completes ambulatory toilet transfers at Mod I level using RW Balance Balance Balance Assessed: Yes Dynamic Standing - Level of Assistance: 6: Modified independent (Device/increase time) Comments: Per RN, during toileting   Yashua Bracco [name] 02/09/2020, 10:57 AM

## 2020-02-09 NOTE — Plan of Care (Signed)
  Problem: Consults Goal: RH GENERAL PATIENT EDUCATION Description: See Patient Education module for education specifics. Outcome: Progressing   Problem: RH BOWEL ELIMINATION Goal: RH STG MANAGE BOWEL WITH ASSISTANCE Description: STG Manage Bowel with minimal assistance. Outcome: Progressing   Problem: RH BLADDER ELIMINATION Goal: RH STG MANAGE BLADDER WITH ASSISTANCE Description: STG Manage Bladder With minimal Assistance Outcome: Progressing   Problem: RH SKIN INTEGRITY Goal: RH STG SKIN FREE OF INFECTION/BREAKDOWN Outcome: Progressing Goal: RH STG MAINTAIN SKIN INTEGRITY WITH ASSISTANCE Description: STG Maintain Skin Integrity With mod I Assistance. Outcome: Progressing

## 2020-02-09 NOTE — Progress Notes (Signed)
niMODipine (NIMOTOP) capsule 60 mg    Patient refused attempts to explain medications and purpose unsuccessful

## 2020-02-09 NOTE — Progress Notes (Addendum)
Physical Therapy Discharge Summary  Patient Details  Name: DEMEISHA GERAGHTY MRN: [id number] Date of Birth: [birth year]  Today's Date: 02/09/2020 PT Individual Time: 0802-0915 PT Individual Time Calculation (min): 73 min    Patient has met 5 of 11 long term goals due to improved activity tolerance, improved balance, increased strength and decreased pain.  Patient to discharge at an ambulatory level Supervision.  Pt's sister has completed hands on education and is able to provide supervision assist. Recommend 24/7 supervision.   Reasons goals not met: Pt did not meet Mod I goals as she requires supervision secondary to her cognitive deficits.   Recommendation:  Patient will benefit from ongoing skilled PT services in home health to continue to advance safe functional mobility, address ongoing impairments in pain, functional mobility, gait, and minimize fall risk.  Equipment: RW  Reasons for discharge: treatment goals met  Patient/family agrees with progress made and goals achieved: Yes    Skilled Physical Therapy Interventions  Patient supine in bed upon PT arrival, agreeable to therapy tx. Therapist did not specifically ask about pain d/t pt's perseveration towards L hip/back pain in previous sessions. Pt reported having a HA and receiving Tylenol recently and also had reports of lightheadedness. Therapist assessed vitals in supine with HOB elevated: BP150/67. Supine > sitting EOB without assist. Therapist re-assessed vitals in sitting: BP 120/70. Pt donned shoes without assist. Sit > stand supervision with RW, pt ambulated ~75' to ortho gym to perform car transfer, pick up a 1# weight from the floor, and walk on unlevel surfaces with supervision only. Pt ambulated with RW and supervision to rehab apartment to perform bed mobility independently, furniture transfer supervision, and stairs with and without rails x 12 steps total with CGA. Therapist assessed LE strength and sensation (see below).  Pt ambulated > 500 ft with RW and supervision on hospital floor, utilize elevator with cues for threshold, and throughout atrium area for community ambulation. Once back in rehab gym, pt performed sit <> stands from standard chair without UE support x 10 with RW in front of pt for safety and attempted bridging however pt experienced inc pain and had to stop exercise after x 5 reps. Sit <> stands given to pt as a home exercise, therapist provided handout. Pt ambulated back to room with RW and supervision. Stand > sitting EOB supervision with RW, sit > supine without assist. Pt left supine in bed with needs in reach and bed alarm set.    PT Discharge Precautions/Restrictions Precautions Precautions: Fall Restrictions Weight Bearing Restrictions: No Cognition Overall Cognitive Status: Within Functional Limits for tasks assessed Arousal/Alertness: Awake/alert Orientation Level: Oriented X4 Sustained Attention: Impaired Memory: Impaired Problem Solving: Impaired Safety/Judgment: Appears intact Sensation Sensation Light Touch: Appears Intact Proprioception: Appears Intact Additional Comments: decreased bilaterally lower leg/ankle, inconcsistent R/L discrimination Coordination Fine Motor Movements are Fluid and Coordinated: Yes Coordination and Movement Description: generalized weakness with functional mobility Motor  Motor Motor: Within Functional Limits Motor - Discharge Observations: generalized weakness and decreased activity tolerance (improved since initial evaluation)  Mobility Bed Mobility Bed Mobility: Rolling Right;Rolling Left;Supine to Sit;Sit to Supine Rolling Right: Supervision/verbal cueing Rolling Left: Supervision/Verbal cueing Right Sidelying to Sit: Supervision/Verbal cueing Supine to Sit: Supervision/Verbal cueing Sit to Supine: Supervision/Verbal cueing Transfers Transfers: Sit to Stand;Stand to Sit;Stand Pivot Transfers Sit to Stand: Supervision/Verbal  cueing Stand to Sit: Supervision/Verbal cueing Stand Pivot Transfers: Supervision/Verbal cueing Stand Pivot Transfer Details: Verbal cues for technique;Verbal cues for safe use of DME/AE;Verbal cues for precautions/safety  Transfer Programmer, multimedia): Manufacturing systems engineer Ambulation: Yes Gait Assistance: Supervision/Verbal cueing Gait Distance (Feet): 500 Feet(> 500 ft, unable to give exact distance - community level throughout hospital) Assistive device: Rolling walker Gait Assistance Details: Verbal cues for sequencing;Verbal cues for precautions/safety;Verbal cues for technique;Verbal cues for safe use of DME/AE Gait Gait: Yes Gait Pattern: Impaired Gait Pattern: Narrow base of support Gait velocity: decreased, mild lateral swaying with fatigue Stairs / Additional Locomotion Stairs: Yes Stairs Assistance: Contact Guard/Touching assist Stair Management Technique: No rails;One rail Right;Alternating pattern Number of Stairs: 12 Height of Stairs: 6 Ramp: Supervision/Verbal cueing Curb: Contact Guard/Touching assist Wheelchair Mobility Wheelchair Mobility: No  Trunk/Postural Assessment  Cervical Assessment Cervical Assessment: Within Functional Limits Thoracic Assessment Thoracic Assessment: Within Functional Limits Lumbar Assessment Lumbar Assessment: Within Functional Limits Postural Control Postural Control: Within Functional Limits  Balance Balance Balance Assessed: Yes Static Sitting Balance Static Sitting - Level of Assistance: stand by assist Dynamic Sitting Balance Dynamic Sitting - Level of Assistance: stand by assist Static Standing Balance Static Standing - Level of Assistance: stand by assist Dynamic Standing Balance Dynamic Standing - Level of Assistance: 5: Stand by assistance Extremity Assessment  RLE Assessment RLE Assessment: Exceptions to [facility] Active Range of Motion (AROM) Comments: WFL General Strength Comments: grossly 4+/5 except for hip  flexion 4-/5 tested in sitting LLE Assessment LLE Assessment: Exceptions to [facility] Active Range of Motion (AROM) Comments: WFL General Strength Comments: grossly 4+/5 except for hip flexion 4-/5 tested in sitting    Nicole Turcotte SPT 02/09/2020, 7:40 AM

## 2020-02-09 NOTE — Progress Notes (Signed)
Speech Language Pathology Discharge Summary  Patient Details  Name: Autumn Le MRN: [id number] Date of Birth: [birth year]  Today's Date: 02/09/2020 SLP Individual Time: 1300-1355 SLP Individual Time Calculation (min): 55 min   Skilled Therapeutic Interventions:   Pt was seen for skilled ST intervention targeting goals for recall, problem solving, attention, and awareness of deficits. Pt was pleasant and cooperative with unfamiliar therapist. SLP facilitated session by administering the [place] Mental Status (SLUMS) Examination. Pt scored 12/30, indicating marked neurocognitive deficits. Points were lost on orientation (day), mental math, thought organization delayed recall, digit reversal, clock drawing, and auditory attention and recall. SLP engaged pt in med management task. Pt required mod + multimodal cues to open pill bottles and fill sections accurately, and was unable to correct misplaced "pills" when errors were pointed out. Pt was unable to verbalize why incorrect placement of medications might be problematic, stating "well, I'll just have to keep doing it until I get it right". Pt was left in bed with alarm on, all needs within reach.   Patient has met 2 of 4 long term goals.  Patient to discharge at overall Mod level.   Reasons goals not met: Pt continues to require mod assist for functional problem solving tasks and use of compensatory strategies for recall.   Clinical Impression/Discharge Summary: Pt has made limited progress during rehab stay, due largely to poor participation and decreased tolerance of tasks. Pt exhibits poor awareness of deficits and their functional impact, and requires moderate visual and verbal cues for functional problem solving tasks, recall, and attention.   Care Partner:  Caregiver Able to Provide Assistance: No  Type of Caregiver Assistance: Cognitive  Recommendation:  Skilled Nursing facility  Rationale for SLP Follow Up: Maximize  cognitive function and independence   Equipment:    None  Reasons for discharge: Discharged from hospital   Patient/Family Agrees with Progress Made and Goals Achieved: Yes   Kristoff Coonradt B. Quentin Ore, [facility], CCC-SLP Speech Language Pathologist  Shonna Chock 02/09/2020, 2:02 PM

## 2020-02-09 NOTE — Discharge Summary (Addendum)
Physician Discharge Summary  Patient ID: Autumn Le MRN: [id number] DOB/AGE: Apr 28, 1946 74 y.o.  Admit date: 01/31/2020 Discharge date: 02/12/2020  Discharge Diagnoses:  Principal Problem:   Ruptured cerebral aneurysm [facility]) Active Problems:   SAH (subarachnoid hemorrhage) (HCC)   Agitation   AAA (abdominal aortic aneurysm) without rupture (HCC)   Acute blood loss anemia   Hypokalemia   Leukocytosis   Essential hypertension   Discharged Condition:  Stable   Significant Diagnostic Studies: CT Angio Head W or Wo Contrast  Result Date: 01/23/2020 CLINICAL DATA:  Dizziness EXAM: CT ANGIOGRAPHY HEAD AND NECK TECHNIQUE: Multidetector CT imaging of the head and neck was performed using the standard protocol during bolus administration of intravenous contrast. Multiplanar CT image reconstructions and MIPs were obtained to evaluate the vascular anatomy. Carotid stenosis measurements (when applicable) are obtained utilizing NASCET criteria, using the distal internal carotid diameter as the denominator. CONTRAST:  35m OMNIPAQUE IOHEXOL 350 MG/ML SOLN COMPARISON:  None. FINDINGS: CTA NECK FINDINGS Aortic arch: Calcified and irregular noncalcified plaque is present along the aortic arch and at the patent great vessel origins. Right carotid system: Patent. Calcified and noncalcified plaque at the ICA origin causes less than 50% stenosis. Left carotid system: Patent. Mild to moderate stenosis of the common carotid origin. Atherosclerotic wall thickening along the remainder of the common carotid. Calcified and noncalcified plaque at the bifurcation and along the proximal carotid with less than 50% stenosis. Vertebral arteries: Patent.  Left vertebral artery is dominant. Skeleton: Cervical spine dictated separately. Other neck: No mass or adenopathy. Upper chest: Emphysema. Review of the MIP images confirms the above findings CTA HEAD FINDINGS Anterior circulation: Intracranial internal carotid arteries  are patent. Anterior and middle cerebral arteries are patent. There is in inferolaterally directed aneurysm of the distal supraclinoid right ICA likely contiguous with the posterior communicating artery origin measuring approximately 8.6 x 5.3 mm axially. Neck measures 2.8 mm. Second aneurysm of the right A2 ACA measuring approximately 2.5 x 3.6 mm axially. Posterior circulation: Intracranial right vertebral artery becomes markedly diminutive after PICA origin. Intracranial left vertebral arteries patent with minimal calcified plaque. Basilar artery is patent. Posterior cerebral arteries are patent. Venous sinuses: As permitted by contrast timing, patent. Anatomic variants: Fetal origin of the right PCA. Review of the MIP images confirms the above findings IMPRESSION: Approximately 9 mm aneurysm of the communicating segment of the right ICA. Likely culprit aneurysm given distribution of hemorrhage on noncontrast CT. Additional approximately 4 mm aneurysm of the right A2 ACA. Carotid atherosclerosis with less than 50% stenosis of the proximal internal carotids. These results were called by telephone at the time of interpretation on 01/23/2020 at 12:09 pm [facility], who verbally acknowledged these results. Electronically Signed   By: PMacy MisM.D.   On: 01/23/2020 12:28   DG Lumbar Spine 2-3 Views  Result Date: 02/04/2020 CLINICAL DATA:  Recent fall with low back pain, initial encounter EXAM: LUMBAR SPINE - 2-3 VIEW COMPARISON:  None. FINDINGS: Five lumbar type vertebral bodies are well visualized. Vertebral body height is well maintained. Disc space narrowing is noted at L4-5. No compression deformities are seen. No anterolisthesis is noted. Aortic calcifications are noted with dilatation to 4.8 cm on the lateral film. IMPRESSION: Degenerative change of the lumbar spine without acute bony abnormality. Dilatation of the infrarenal aorta to 4.8 cm on the lateral film. CTA is recommended for  further evaluation. Electronically Signed   By: MInez CatalinaM.D.   On:  02/04/2020 16:31   CT Head Wo Contrast  Result Date: 01/23/2020 CLINICAL DATA:  Head trauma, headache EXAM: CT HEAD WITHOUT CONTRAST CT CERVICAL SPINE WITHOUT CONTRAST TECHNIQUE: Multidetector CT imaging of the head and cervical spine was performed following the standard protocol without intravenous contrast. Multiplanar CT image reconstructions of the cervical spine were also generated. COMPARISON:  None. FINDINGS: CT HEAD FINDINGS Brain: There is subarachnoid hemorrhage about the right cerebral hemisphere and basal right frontal and right temporal lobes, as well as within the basal cisterns. No significant mass effect or midline shift. Small, incidental densely calcified right frontal meningioma. Parenchymal hypodensity of the right temporal pole. Vascular: No hyperdense vessel or unexpected calcification. Skull: Normal. Negative for fracture or focal lesion. Sinuses/Orbits: No acute finding. Other: None. CT CERVICAL SPINE FINDINGS Alignment: Normal. Skull base and vertebrae: No acute fracture. No primary bone lesion or focal pathologic process. Soft tissues and spinal canal: No prevertebral fluid or swelling. No visible canal hematoma. Disc levels: Mild moderate to severe disc space height loss and osteophytosis of C5-C6 and C6-C7 with otherwise mild disc space height loss. Upper chest: Negative. Other: None. IMPRESSION: 1. Extensive subarachnoid hemorrhage about the right cerebral hemisphere and basal cisterns. No significant mass effect or midline shift. This is of uncertain etiology, traumatic versus aneurysmal. No overlying skull fracture. Please see forthcoming CT angiogram. 2. Parenchymal hypodensity of the right temporal pole, of uncertain etiology, traumatic versus infarction. 3. No fracture or static subluxation of the cervical spine. These results were called by telephone at the time of interpretation on 01/23/2020 at 12:07 pm to  provider [facility] , who verbally acknowledged these results. Electronically Signed   By: Eddie Candle M.D.   On: 01/23/2020 12:08   CT Angio Neck W and/or Wo Contrast  Result Date: 01/23/2020 CLINICAL DATA:  Dizziness EXAM: CT ANGIOGRAPHY HEAD AND NECK TECHNIQUE: Multidetector CT imaging of the head and neck was performed using the standard protocol during bolus administration of intravenous contrast. Multiplanar CT image reconstructions and MIPs were obtained to evaluate the vascular anatomy. Carotid stenosis measurements (when applicable) are obtained utilizing NASCET criteria, using the distal internal carotid diameter as the denominator. CONTRAST:  31m OMNIPAQUE IOHEXOL 350 MG/ML SOLN COMPARISON:  None. FINDINGS: CTA NECK FINDINGS Aortic arch: Calcified and irregular noncalcified plaque is present along the aortic arch and at the patent great vessel origins. Right carotid system: Patent. Calcified and noncalcified plaque at the ICA origin causes less than 50% stenosis. Left carotid system: Patent. Mild to moderate stenosis of the common carotid origin. Atherosclerotic wall thickening along the remainder of the common carotid. Calcified and noncalcified plaque at the bifurcation and along the proximal carotid with less than 50% stenosis. Vertebral arteries: Patent.  Left vertebral artery is dominant. Skeleton: Cervical spine dictated separately. Other neck: No mass or adenopathy. Upper chest: Emphysema. Review of the MIP images confirms the above findings CTA HEAD FINDINGS Anterior circulation: Intracranial internal carotid arteries are patent. Anterior and middle cerebral arteries are patent. There is in inferolaterally directed aneurysm of the distal supraclinoid right ICA likely contiguous with the posterior communicating artery origin measuring approximately 8.6 x 5.3 mm axially. Neck measures 2.8 mm. Second aneurysm of the right A2 ACA measuring approximately 2.5 x 3.6 mm axially. Posterior  circulation: Intracranial right vertebral artery becomes markedly diminutive after PICA origin. Intracranial left vertebral arteries patent with minimal calcified plaque. Basilar artery is patent. Posterior cerebral arteries are patent. Venous sinuses: As permitted by contrast timing, patent. Anatomic  variants: Fetal origin of the right PCA. Review of the MIP images confirms the above findings IMPRESSION: Approximately 9 mm aneurysm of the communicating segment of the right ICA. Likely culprit aneurysm given distribution of hemorrhage on noncontrast CT. Additional approximately 4 mm aneurysm of the right A2 ACA. Carotid atherosclerosis with less than 50% stenosis of the proximal internal carotids. These results were called by telephone at the time of interpretation on 01/23/2020 at 12:09 pm to provider [facility] , who verbally acknowledged these results. Electronically Signed   By: Macy Mis M.D.   On: 01/23/2020 12:28   CT Angio Chest PE W and/or Wo Contrast  Result Date: 01/23/2020 CLINICAL DATA:  Shortness of breath.  Fell yesterday. EXAM: CT ANGIOGRAPHY CHEST WITH CONTRAST TECHNIQUE: Multidetector CT imaging of the chest was performed using the standard protocol during bolus administration of intravenous contrast. Multiplanar CT image reconstructions and MIPs were obtained to evaluate the vascular anatomy. CONTRAST:  67m OMNIPAQUE IOHEXOL 350 MG/ML SOLN COMPARISON:  04/08/2019 FINDINGS: Cardiovascular: Pulmonary arterial opacification is excellent. There are no pulmonary emboli. Heart size is normal. No pericardial fluid. No visible coronary artery calcification. Minimal aortic atherosclerotic calcification. Mediastinum/Nodes: No mediastinal or hilar mass or lymphadenopathy. Lungs/Pleura: Emphysema. Apical pleural and parenchymal scarring bilaterally which is stable. Previously seen 6 mm nodule in the right upper lobe has resolved, indicating that it was inflammatory. No other focal lung finding. Upper  Abdomen: 1 cm cyst in the ventral liver. No significant upper abdominal finding. Musculoskeletal: Negative Review of the MIP images confirms the above findings. IMPRESSION: No pulmonary emboli or other acute chest finding. Resolution of a previously seen 6 mm nodule in the right upper lobe, indicating that it was inflammatory. Aortic Atherosclerosis (ICD10-I70.0) and Emphysema (ICD10-J43.9). Electronically Signed   By: MNelson ChimesM.D.   On: 01/23/2020 12:05   CT Cervical Spine Wo Contrast  Result Date: 01/23/2020 CLINICAL DATA:  Head trauma, headache EXAM: CT HEAD WITHOUT CONTRAST CT CERVICAL SPINE WITHOUT CONTRAST TECHNIQUE: Multidetector CT imaging of the head and cervical spine was performed following the standard protocol without intravenous contrast. Multiplanar CT image reconstructions of the cervical spine were also generated. COMPARISON:  None. FINDINGS: CT HEAD FINDINGS Brain: There is subarachnoid hemorrhage about the right cerebral hemisphere and basal right frontal and right temporal lobes, as well as within the basal cisterns. No significant mass effect or midline shift. Small, incidental densely calcified right frontal meningioma. Parenchymal hypodensity of the right temporal pole. Vascular: No hyperdense vessel or unexpected calcification. Skull: Normal. Negative for fracture or focal lesion. Sinuses/Orbits: No acute finding. Other: None. CT CERVICAL SPINE FINDINGS Alignment: Normal. Skull base and vertebrae: No acute fracture. No primary bone lesion or focal pathologic process. Soft tissues and spinal canal: No prevertebral fluid or swelling. No visible canal hematoma. Disc levels: Mild moderate to severe disc space height loss and osteophytosis of C5-C6 and C6-C7 with otherwise mild disc space height loss. Upper chest: Negative. Other: None. IMPRESSION: 1. Extensive subarachnoid hemorrhage about the right cerebral hemisphere and basal cisterns. No significant mass effect or midline shift. This  is of uncertain etiology, traumatic versus aneurysmal. No overlying skull fracture. Please see forthcoming CT angiogram. 2. Parenchymal hypodensity of the right temporal pole, of uncertain etiology, traumatic versus infarction. 3. No fracture or static subluxation of the cervical spine. These results were called by telephone at the time of interpretation on 01/23/2020 at 12:07 pm [facility], who verbally acknowledged these results. Electronically Signed  By: Eddie Candle M.D.   On: 01/23/2020 12:08   IR Transcath/Emboliz  Result Date: 02/04/2020 PROCEDURE: DIAGNOSTIC CEREBRAL ANGIOGRAM COIL EMBOLIZATION OF RIGHT POSTERIOR COMMUNICATING ARTERY ANEURYSM HISTORY: The patient is a 74 year old woman initially presenting to the hospital with memory loss and confusion, with headache. CT scan did demonstrate subarachnoid hemorrhage right greater than left, with CT angiogram demonstrating right-sided posterior communicating artery aneurysm as well as more distal anterior cerebral artery aneurysm. She therefore presents for further workup with diagnostic cerebral angiogram and possible coil embolization of the right-sided aneurysm. ACCESS: The technical aspects of the procedure as well as its potential risks and benefits were reviewed with the patient and the patient's family. These risks included but were not limited to stroke, intracranial hemorrhage, bleeding, infection, allergic reaction, damage to organs or vital structures, stroke, non-diagnostic procedure, and the catastrophic outcomes of heart attack, coma, and death. With an understanding of these risks, informed consent was obtained and witnessed. The patient was placed in the supine position on the angiography table and the skin of right groin prepped in the usual sterile fashion. The procedure was performed under general anesthesia. A 5-French sheath was introduced in the right common femoral artery using Seldinger technique. MEDICATIONS: HEPARIN:  0 Units total. CONTRAST:  48m OMNIPAQUE IOHEXOL 300 MG/ML  SOLNcc, Omnipaque 300 FLUOROSCOPY TIME:  FLUOROSCOPY TIME: See IR records TECHNIQUE: CATHETERS AND WIRES 5-French JB-1 catheter 180 cm 0.035" glidewire 6-French NeuronMax guide sheath 6-French Berenstein Select JB-1 catheter 0.058" CatV guidecatheter 150 cm Phenom microcatheter Synchro 2 standard microwire Excelsior XT-17 microcatheter COILS USED Target XL 360 soft 7 mm x 20 mm Target XL 360 soft 6 mm x 20 mm Target 360 XL soft 5 mm x 10 mm Target XL 360 soft 3 mm x 9 mm VESSELS CATHETERIZED Right internal carotid Left internal carotid Left vertebral Right common femoral Right middle cerebral artery VESSELS STUDIED Right internal carotid, head Left internal carotid, head Left vertebral Right internal carotid artery, head (during embolization) Right internal carotid artery, head (immediate post-embolization) Right internal carotid artery, head (final control) Right common femoral PROCEDURAL NARRATIVE A 5-Fr JB-1 glide catheter was advanced over a 0.035 glidewire into the aortic arch. The above vessels were then sequentially catheterized and cervical / cerebral angiograms taken. After review of images, the catheter was removed without incident. The 5-Fr sheath was then exchanged over the glidewire for an 8-Fr sheath. Under real-time fluoroscopy, the guide sheath was advanced over the select catheter and glidewire into the descending aorta. The select catheter was then advanced into the cervical right internal carotid artery. The guide sheath was then advanced into the proximal cervical right internal carotid artery. The Select catheter was removed and the 058 guide catheter was coaxially introduced over the microcatheter and microwire. The microcatheter was then advanced under roadmap guidance into the right middle cerebral artery. The guide catheter was then tracked over the microcatheter to its final position in the distal cavernous right internal carotid  artery. The microcatheter was then removed and the coiling catheter introduced over the microwire. The catheter was then navigated into the aneurysm lumen over the microwire. The wire was then removed. The above coils were then sequentially deployed with progressive exclusion of the aneurysm. Cerebral angiography was performed immediately post embolization. The coiling catheter was then removed and the guide catheter withdrawn into the cervical internal carotid artery. Final control angiography was then performed from the guide catheter. The guide catheter and guide sheath were then synchronously removed without  incident. FINDINGS: Right internal carotid, head: Injection reveals the presence of a widely patent ICA, M1, and A1 segments and their branches. There is an aneurysm arising from the origin of the right posterior communicating artery projecting laterally measuring approximately 6.2 x 6.1 x 8.9 mm. The right posterior cerebral artery appears to be fetal type, and arises from the neck of the aneurysm. Another aneurysm is identified at the bifurcation of the A2 segment into the pericallosal and callosal marginal arteries. This aneurysm measures approximately 3.9 x 4.6 mm. The parenchymal and venous phases are normal. The venous sinuses are widely patent. Left internal carotid, head: Injection reveals the presence of a widely patent ICA, A1, and M1 segments and their branches. There is a medially projecting apparently wide necked aneurysm arising from the horizontal segment of the cavernous left internal carotid artery. This aneurysm measures approximately 8.0 x 5.6 mm. The parenchymal and venous phases are normal. The venous sinuses are widely patent. Left vertebral: Injection reveals the presence of a widely patent vertebral artery. There is very mild FMD type changes in the distal V2 and V3 segments of the left vertebral artery. There is no stenosis. The basilar artery is patent and terminates in left P1,  while the right P1 is noted to be hypoplastic. The basilar apex is normal. No aneurysms, AVMs, or high-flow fistulas are seen. The parenchymal and venous phases are normal. The venous sinuses are widely patent. Right internal carotid artery, head (during embolization): Injection reveals the presence of a widely patent ICA that leads to a patent ACA and MCA. Angiograms taken during coiling demonstrate stability of the coil mass within the aneurysm, without coil prolapse or filling defect to suggest thrombus. Angiograms also demonstrate progressive exclusion of the aneurysm. The fetal type PCA also remains widely patent. Right internal carotid artery, head (immediate post-embolization): Injection reveals the presence of a widely patent ICA that leads to a patent ACA and MCA. The fetal type right PCA is also widely patent. Coil mass within the aneurysm is stable, without coil prolapse or filling defect to suggest thrombus. No aneurysm filling is seen. Right internal carotid artery, head (final control): Injection reveals the presence of a widely patent ICA that leads to a patent ACA and MCA. The fetal type right PCA is widely patent. No thrombus is visualized. Coil mass is seen within the aneurysm and is in stable position. The parenchymal and venous phases are unremarkable. Right femoral: Normal vessel. No significant atherosclerotic disease. Arterial sheath in adequate position. DISPOSITION: Upon completion of the study, the femoral sheath was removed and hemostasis obtained using a 7-Fr ExoSeal closure device. Good proximal and distal lower extremity pulses were documented upon achievement of hemostasis. The procedure was well tolerated and no early complications were observed. The patient was transferred to the postanesthesia care unit in stable hemodynamic condition. IMPRESSION: 1. 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  Fetal type right posterior cerebral artery remains widely patent. 2.  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 3. 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 The preliminary results of this procedure were shared with the patient's family. Electronically Signed   By: Consuella Lose   On: 02/04/2020 17:10   IR Angiogram Follow Up Study  Result Date: 02/04/2020 PROCEDURE: DIAGNOSTIC CEREBRAL ANGIOGRAM COIL EMBOLIZATION OF RIGHT POSTERIOR COMMUNICATING ARTERY ANEURYSM HISTORY: The patient is a 74 year old woman initially presenting to the hospital with memory loss  and confusion, with headache. CT scan did demonstrate subarachnoid hemorrhage right greater than left, with CT angiogram demonstrating right-sided posterior communicating artery aneurysm as well as more distal anterior cerebral artery aneurysm. She therefore presents for further workup with diagnostic cerebral angiogram and possible coil embolization of the right-sided aneurysm. ACCESS: The technical aspects of the procedure as well as its potential risks and benefits were reviewed with the patient and the patient's family. These risks included but were not limited to stroke, intracranial hemorrhage, bleeding, infection, allergic reaction, damage to organs or vital structures, stroke, non-diagnostic procedure, and the catastrophic outcomes of heart attack, coma, and death. With an understanding of these risks, informed consent was obtained and witnessed. The patient was placed in the supine position on the angiography table and the skin of right groin prepped in the usual sterile fashion. The procedure was performed under general anesthesia. A 5-French sheath was introduced in the right common femoral artery using Seldinger technique. MEDICATIONS: HEPARIN: 0 Units total. CONTRAST:  36m OMNIPAQUE IOHEXOL 300 MG/ML  SOLNcc, Omnipaque 300 FLUOROSCOPY TIME:  FLUOROSCOPY TIME: See IR records  TECHNIQUE: CATHETERS AND WIRES 5-French JB-1 catheter 180 cm 0.035" glidewire 6-French NeuronMax guide sheath 6-French Berenstein Select JB-1 catheter 0.058" CatV guidecatheter 150 cm Phenom microcatheter Synchro 2 standard microwire Excelsior XT-17 microcatheter COILS USED Target XL 360 soft 7 mm x 20 mm Target XL 360 soft 6 mm x 20 mm Target 360 XL soft 5 mm x 10 mm Target XL 360 soft 3 mm x 9 mm VESSELS CATHETERIZED Right internal carotid Left internal carotid Left vertebral Right common femoral Right middle cerebral artery VESSELS STUDIED Right internal carotid, head Left internal carotid, head Left vertebral Right internal carotid artery, head (during embolization) Right internal carotid artery, head (immediate post-embolization) Right internal carotid artery, head (final control) Right common femoral PROCEDURAL NARRATIVE A 5-Fr JB-1 glide catheter was advanced over a 0.035 glidewire into the aortic arch. The above vessels were then sequentially catheterized and cervical / cerebral angiograms taken. After review of images, the catheter was removed without incident. The 5-Fr sheath was then exchanged over the glidewire for an 8-Fr sheath. Under real-time fluoroscopy, the guide sheath was advanced over the select catheter and glidewire into the descending aorta. The select catheter was then advanced into the cervical right internal carotid artery. The guide sheath was then advanced into the proximal cervical right internal carotid artery. The Select catheter was removed and the 058 guide catheter was coaxially introduced over the microcatheter and microwire. The microcatheter was then advanced under roadmap guidance into the right middle cerebral artery. The guide catheter was then tracked over the microcatheter to its final position in the distal cavernous right internal carotid artery. The microcatheter was then removed and the coiling catheter introduced over the microwire. The catheter was then navigated into  the aneurysm lumen over the microwire. The wire was then removed. The above coils were then sequentially deployed with progressive exclusion of the aneurysm. Cerebral angiography was performed immediately post embolization. The coiling catheter was then removed and the guide catheter withdrawn into the cervical internal carotid artery. Final control angiography was then performed from the guide catheter. The guide catheter and guide sheath were then synchronously removed without incident. FINDINGS: Right internal carotid, head: Injection reveals the presence of a widely patent ICA, M1, and A1 segments and their branches. There is an aneurysm arising from the origin of the right posterior communicating artery projecting laterally measuring approximately 6.2 x 6.1 x 8.9  mm. The right posterior cerebral artery appears to be fetal type, and arises from the neck of the aneurysm. Another aneurysm is identified at the bifurcation of the A2 segment into the pericallosal and callosal marginal arteries. This aneurysm measures approximately 3.9 x 4.6 mm. The parenchymal and venous phases are normal. The venous sinuses are widely patent. Left internal carotid, head: Injection reveals the presence of a widely patent ICA, A1, and M1 segments and their branches. There is a medially projecting apparently wide necked aneurysm arising from the horizontal segment of the cavernous left internal carotid artery. This aneurysm measures approximately 8.0 x 5.6 mm. The parenchymal and venous phases are normal. The venous sinuses are widely patent. Left vertebral: Injection reveals the presence of a widely patent vertebral artery. There is very mild FMD type changes in the distal V2 and V3 segments of the left vertebral artery. There is no stenosis. The basilar artery is patent and terminates in left P1, while the right P1 is noted to be hypoplastic. The basilar apex is normal. No aneurysms, AVMs, or high-flow fistulas are seen. The  parenchymal and venous phases are normal. The venous sinuses are widely patent. Right internal carotid artery, head (during embolization): Injection reveals the presence of a widely patent ICA that leads to a patent ACA and MCA. Angiograms taken during coiling demonstrate stability of the coil mass within the aneurysm, without coil prolapse or filling defect to suggest thrombus. Angiograms also demonstrate progressive exclusion of the aneurysm. The fetal type PCA also remains widely patent. Right internal carotid artery, head (immediate post-embolization): Injection reveals the presence of a widely patent ICA that leads to a patent ACA and MCA. The fetal type right PCA is also widely patent. Coil mass within the aneurysm is stable, without coil prolapse or filling defect to suggest thrombus. No aneurysm filling is seen. Right internal carotid artery, head (final control): Injection reveals the presence of a widely patent ICA that leads to a patent ACA and MCA. The fetal type right PCA is widely patent. No thrombus is visualized. Coil mass is seen within the aneurysm and is in stable position. The parenchymal and venous phases are unremarkable. Right femoral: Normal vessel. No significant atherosclerotic disease. Arterial sheath in adequate position. DISPOSITION: Upon completion of the study, the femoral sheath was removed and hemostasis obtained using a 7-Fr ExoSeal closure device. Good proximal and distal lower extremity pulses were documented upon achievement of hemostasis. The procedure was well tolerated and no early complications were observed. The patient was transferred to the postanesthesia care unit in stable hemodynamic condition. IMPRESSION: 1. 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 Fetal type right posterior cerebral artery remains widely patent. 2.  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 3.  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 The preliminary results of this procedure were shared with the patient's family. Electronically Signed   By: Consuella Lose   On: 02/04/2020 17:10   IR Angiogram Follow Up Study  Result Date: 02/04/2020 PROCEDURE: DIAGNOSTIC CEREBRAL ANGIOGRAM COIL EMBOLIZATION OF RIGHT POSTERIOR COMMUNICATING ARTERY ANEURYSM HISTORY: The patient is a 74 year old woman initially presenting to the hospital with memory loss and confusion, with headache. CT scan did demonstrate subarachnoid hemorrhage right greater than left, with CT angiogram demonstrating right-sided posterior communicating artery aneurysm as well as more distal anterior cerebral artery aneurysm. She therefore presents for further workup with diagnostic cerebral angiogram and possible coil  embolization of the right-sided aneurysm. ACCESS: The technical aspects of the procedure as well as its potential risks and benefits were reviewed with the patient and the patient's family. These risks included but were not limited to stroke, intracranial hemorrhage, bleeding, infection, allergic reaction, damage to organs or vital structures, stroke, non-diagnostic procedure, and the catastrophic outcomes of heart attack, coma, and death. With an understanding of these risks, informed consent was obtained and witnessed. The patient was placed in the supine position on the angiography table and the skin of right groin prepped in the usual sterile fashion. The procedure was performed under general anesthesia. A 5-French sheath was introduced in the right common femoral artery using Seldinger technique. MEDICATIONS: HEPARIN: 0 Units total. CONTRAST:  78m OMNIPAQUE IOHEXOL 300 MG/ML  SOLNcc, Omnipaque 300 FLUOROSCOPY TIME:  FLUOROSCOPY TIME: See IR records TECHNIQUE: CATHETERS AND WIRES 5-French JB-1 catheter 180 cm 0.035" glidewire 6-French NeuronMax guide sheath 6-French Berenstein  Select JB-1 catheter 0.058" CatV guidecatheter 150 cm Phenom microcatheter Synchro 2 standard microwire Excelsior XT-17 microcatheter COILS USED Target XL 360 soft 7 mm x 20 mm Target XL 360 soft 6 mm x 20 mm Target 360 XL soft 5 mm x 10 mm Target XL 360 soft 3 mm x 9 mm VESSELS CATHETERIZED Right internal carotid Left internal carotid Left vertebral Right common femoral Right middle cerebral artery VESSELS STUDIED Right internal carotid, head Left internal carotid, head Left vertebral Right internal carotid artery, head (during embolization) Right internal carotid artery, head (immediate post-embolization) Right internal carotid artery, head (final control) Right common femoral PROCEDURAL NARRATIVE A 5-Fr JB-1 glide catheter was advanced over a 0.035 glidewire into the aortic arch. The above vessels were then sequentially catheterized and cervical / cerebral angiograms taken. After review of images, the catheter was removed without incident. The 5-Fr sheath was then exchanged over the glidewire for an 8-Fr sheath. Under real-time fluoroscopy, the guide sheath was advanced over the select catheter and glidewire into the descending aorta. The select catheter was then advanced into the cervical right internal carotid artery. The guide sheath was then advanced into the proximal cervical right internal carotid artery. The Select catheter was removed and the 058 guide catheter was coaxially introduced over the microcatheter and microwire. The microcatheter was then advanced under roadmap guidance into the right middle cerebral artery. The guide catheter was then tracked over the microcatheter to its final position in the distal cavernous right internal carotid artery. The microcatheter was then removed and the coiling catheter introduced over the microwire. The catheter was then navigated into the aneurysm lumen over the microwire. The wire was then removed. The above coils were then sequentially deployed with  progressive exclusion of the aneurysm. Cerebral angiography was performed immediately post embolization. The coiling catheter was then removed and the guide catheter withdrawn into the cervical internal carotid artery. Final control angiography was then performed from the guide catheter. The guide catheter and guide sheath were then synchronously removed without incident. FINDINGS: Right internal carotid, head: Injection reveals the presence of a widely patent ICA, M1, and A1 segments and their branches. There is an aneurysm arising from the origin of the right posterior communicating artery projecting laterally measuring approximately 6.2 x 6.1 x 8.9 mm. The right posterior cerebral artery appears to be fetal type, and arises from the neck of the aneurysm. Another aneurysm is identified at the bifurcation of the A2 segment into the pericallosal and callosal marginal arteries. This aneurysm measures approximately 3.9 x 4.6 mm.  The parenchymal and venous phases are normal. The venous sinuses are widely patent. Left internal carotid, head: Injection reveals the presence of a widely patent ICA, A1, and M1 segments and their branches. There is a medially projecting apparently wide necked aneurysm arising from the horizontal segment of the cavernous left internal carotid artery. This aneurysm measures approximately 8.0 x 5.6 mm. The parenchymal and venous phases are normal. The venous sinuses are widely patent. Left vertebral: Injection reveals the presence of a widely patent vertebral artery. There is very mild FMD type changes in the distal V2 and V3 segments of the left vertebral artery. There is no stenosis. The basilar artery is patent and terminates in left P1, while the right P1 is noted to be hypoplastic. The basilar apex is normal. No aneurysms, AVMs, or high-flow fistulas are seen. The parenchymal and venous phases are normal. The venous sinuses are widely patent. Right internal carotid artery, head (during  embolization): Injection reveals the presence of a widely patent ICA that leads to a patent ACA and MCA. Angiograms taken during coiling demonstrate stability of the coil mass within the aneurysm, without coil prolapse or filling defect to suggest thrombus. Angiograms also demonstrate progressive exclusion of the aneurysm. The fetal type PCA also remains widely patent. Right internal carotid artery, head (immediate post-embolization): Injection reveals the presence of a widely patent ICA that leads to a patent ACA and MCA. The fetal type right PCA is also widely patent. Coil mass within the aneurysm is stable, without coil prolapse or filling defect to suggest thrombus. No aneurysm filling is seen. Right internal carotid artery, head (final control): Injection reveals the presence of a widely patent ICA that leads to a patent ACA and MCA. The fetal type right PCA is widely patent. No thrombus is visualized. Coil mass is seen within the aneurysm and is in stable position. The parenchymal and venous phases are unremarkable. Right femoral: Normal vessel. No significant atherosclerotic disease. Arterial sheath in adequate position. DISPOSITION: Upon completion of the study, the femoral sheath was removed and hemostasis obtained using a 7-Fr ExoSeal closure device. Good proximal and distal lower extremity pulses were documented upon achievement of hemostasis. The procedure was well tolerated and no early complications were observed. The patient was transferred to the postanesthesia care unit in stable hemodynamic condition. IMPRESSION: 1. 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 Fetal type right posterior cerebral artery remains widely patent. 2.  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 3. 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 The preliminary results of this procedure  were shared with the patient's family. Electronically Signed   By: Consuella Lose   On: 02/04/2020 17:10   IR Angiogram Follow Up Study  Result Date: 02/04/2020 PROCEDURE: DIAGNOSTIC CEREBRAL ANGIOGRAM COIL EMBOLIZATION OF RIGHT POSTERIOR COMMUNICATING ARTERY ANEURYSM HISTORY: The patient is a 74 year old woman initially presenting to the hospital with memory loss and confusion, with headache. CT scan did demonstrate subarachnoid hemorrhage right greater than left, with CT angiogram demonstrating right-sided posterior communicating artery aneurysm as well as more distal anterior cerebral artery aneurysm. She therefore presents for further workup with diagnostic cerebral angiogram and possible coil embolization of the right-sided aneurysm. ACCESS: The technical aspects of the procedure as well as its potential risks and benefits were reviewed with the patient and the patient's family. These risks included but were not limited to stroke, intracranial hemorrhage, bleeding, infection, allergic reaction, damage  to organs or vital structures, stroke, non-diagnostic procedure, and the catastrophic outcomes of heart attack, coma, and death. With an understanding of these risks, informed consent was obtained and witnessed. The patient was placed in the supine position on the angiography table and the skin of right groin prepped in the usual sterile fashion. The procedure was performed under general anesthesia. A 5-French sheath was introduced in the right common femoral artery using Seldinger technique. MEDICATIONS: HEPARIN: 0 Units total. CONTRAST:  50m OMNIPAQUE IOHEXOL 300 MG/ML  SOLNcc, Omnipaque 300 FLUOROSCOPY TIME:  FLUOROSCOPY TIME: See IR records TECHNIQUE: CATHETERS AND WIRES 5-French JB-1 catheter 180 cm 0.035" glidewire 6-French NeuronMax guide sheath 6-French Berenstein Select JB-1 catheter 0.058" CatV guidecatheter 150 cm Phenom microcatheter Synchro 2 standard microwire Excelsior XT-17 microcatheter  COILS USED Target XL 360 soft 7 mm x 20 mm Target XL 360 soft 6 mm x 20 mm Target 360 XL soft 5 mm x 10 mm Target XL 360 soft 3 mm x 9 mm VESSELS CATHETERIZED Right internal carotid Left internal carotid Left vertebral Right common femoral Right middle cerebral artery VESSELS STUDIED Right internal carotid, head Left internal carotid, head Left vertebral Right internal carotid artery, head (during embolization) Right internal carotid artery, head (immediate post-embolization) Right internal carotid artery, head (final control) Right common femoral PROCEDURAL NARRATIVE A 5-Fr JB-1 glide catheter was advanced over a 0.035 glidewire into the aortic arch. The above vessels were then sequentially catheterized and cervical / cerebral angiograms taken. After review of images, the catheter was removed without incident. The 5-Fr sheath was then exchanged over the glidewire for an 8-Fr sheath. Under real-time fluoroscopy, the guide sheath was advanced over the select catheter and glidewire into the descending aorta. The select catheter was then advanced into the cervical right internal carotid artery. The guide sheath was then advanced into the proximal cervical right internal carotid artery. The Select catheter was removed and the 058 guide catheter was coaxially introduced over the microcatheter and microwire. The microcatheter was then advanced under roadmap guidance into the right middle cerebral artery. The guide catheter was then tracked over the microcatheter to its final position in the distal cavernous right internal carotid artery. The microcatheter was then removed and the coiling catheter introduced over the microwire. The catheter was then navigated into the aneurysm lumen over the microwire. The wire was then removed. The above coils were then sequentially deployed with progressive exclusion of the aneurysm. Cerebral angiography was performed immediately post embolization. The coiling catheter was then removed  and the guide catheter withdrawn into the cervical internal carotid artery. Final control angiography was then performed from the guide catheter. The guide catheter and guide sheath were then synchronously removed without incident. FINDINGS: Right internal carotid, head: Injection reveals the presence of a widely patent ICA, M1, and A1 segments and their branches. There is an aneurysm arising from the origin of the right posterior communicating artery projecting laterally measuring approximately 6.2 x 6.1 x 8.9 mm. The right posterior cerebral artery appears to be fetal type, and arises from the neck of the aneurysm. Another aneurysm is identified at the bifurcation of the A2 segment into the pericallosal and callosal marginal arteries. This aneurysm measures approximately 3.9 x 4.6 mm. The parenchymal and venous phases are normal. The venous sinuses are widely patent. Left internal carotid, head: Injection reveals the presence of a widely patent ICA, A1, and M1 segments and their branches. There is a medially projecting apparently wide necked aneurysm arising from the  horizontal segment of the cavernous left internal carotid artery. This aneurysm measures approximately 8.0 x 5.6 mm. The parenchymal and venous phases are normal. The venous sinuses are widely patent. Left vertebral: Injection reveals the presence of a widely patent vertebral artery. There is very mild FMD type changes in the distal V2 and V3 segments of the left vertebral artery. There is no stenosis. The basilar artery is patent and terminates in left P1, while the right P1 is noted to be hypoplastic. The basilar apex is normal. No aneurysms, AVMs, or high-flow fistulas are seen. The parenchymal and venous phases are normal. The venous sinuses are widely patent. Right internal carotid artery, head (during embolization): Injection reveals the presence of a widely patent ICA that leads to a patent ACA and MCA. Angiograms taken during coiling  demonstrate stability of the coil mass within the aneurysm, without coil prolapse or filling defect to suggest thrombus. Angiograms also demonstrate progressive exclusion of the aneurysm. The fetal type PCA also remains widely patent. Right internal carotid artery, head (immediate post-embolization): Injection reveals the presence of a widely patent ICA that leads to a patent ACA and MCA. The fetal type right PCA is also widely patent. Coil mass within the aneurysm is stable, without coil prolapse or filling defect to suggest thrombus. No aneurysm filling is seen. Right internal carotid artery, head (final control): Injection reveals the presence of a widely patent ICA that leads to a patent ACA and MCA. The fetal type right PCA is widely patent. No thrombus is visualized. Coil mass is seen within the aneurysm and is in stable position. The parenchymal and venous phases are unremarkable. Right femoral: Normal vessel. No significant atherosclerotic disease. Arterial sheath in adequate position. DISPOSITION: Upon completion of the study, the femoral sheath was removed and hemostasis obtained using a 7-Fr ExoSeal closure device. Good proximal and distal lower extremity pulses were documented upon achievement of hemostasis. The procedure was well tolerated and no early complications were observed. The patient was transferred to the postanesthesia care unit in stable hemodynamic condition. IMPRESSION: 1. 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 Fetal type right posterior cerebral artery remains widely patent. 2.  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 3. 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 The preliminary results of this procedure were shared with the patient's family. Electronically Signed   By: Consuella Lose   On: 02/04/2020 17:10   IR Angiogram Follow Up  Study  Result Date: 02/04/2020 PROCEDURE: DIAGNOSTIC CEREBRAL ANGIOGRAM COIL EMBOLIZATION OF RIGHT POSTERIOR COMMUNICATING ARTERY ANEURYSM HISTORY: The patient is a 74 year old woman initially presenting to the hospital with memory loss and confusion, with headache. CT scan did demonstrate subarachnoid hemorrhage right greater than left, with CT angiogram demonstrating right-sided posterior communicating artery aneurysm as well as more distal anterior cerebral artery aneurysm. She therefore presents for further workup with diagnostic cerebral angiogram and possible coil embolization of the right-sided aneurysm. ACCESS: The technical aspects of the procedure as well as its potential risks and benefits were reviewed with the patient and the patient's family. These risks included but were not limited to stroke, intracranial hemorrhage, bleeding, infection, allergic reaction, damage to organs or vital structures, stroke, non-diagnostic procedure, and the catastrophic outcomes of heart attack, coma, and death. With an understanding of these risks, informed consent was obtained and witnessed. The patient was placed in the supine position on the angiography table and the skin  of right groin prepped in the usual sterile fashion. The procedure was performed under general anesthesia. A 5-French sheath was introduced in the right common femoral artery using Seldinger technique. MEDICATIONS: HEPARIN: 0 Units total. CONTRAST:  18m OMNIPAQUE IOHEXOL 300 MG/ML  SOLNcc, Omnipaque 300 FLUOROSCOPY TIME:  FLUOROSCOPY TIME: See IR records TECHNIQUE: CATHETERS AND WIRES 5-French JB-1 catheter 180 cm 0.035" glidewire 6-French NeuronMax guide sheath 6-French Berenstein Select JB-1 catheter 0.058" CatV guidecatheter 150 cm Phenom microcatheter Synchro 2 standard microwire Excelsior XT-17 microcatheter COILS USED Target XL 360 soft 7 mm x 20 mm Target XL 360 soft 6 mm x 20 mm Target 360 XL soft 5 mm x 10 mm Target XL 360 soft 3 mm x 9 mm  VESSELS CATHETERIZED Right internal carotid Left internal carotid Left vertebral Right common femoral Right middle cerebral artery VESSELS STUDIED Right internal carotid, head Left internal carotid, head Left vertebral Right internal carotid artery, head (during embolization) Right internal carotid artery, head (immediate post-embolization) Right internal carotid artery, head (final control) Right common femoral PROCEDURAL NARRATIVE A 5-Fr JB-1 glide catheter was advanced over a 0.035 glidewire into the aortic arch. The above vessels were then sequentially catheterized and cervical / cerebral angiograms taken. After review of images, the catheter was removed without incident. The 5-Fr sheath was then exchanged over the glidewire for an 8-Fr sheath. Under real-time fluoroscopy, the guide sheath was advanced over the select catheter and glidewire into the descending aorta. The select catheter was then advanced into the cervical right internal carotid artery. The guide sheath was then advanced into the proximal cervical right internal carotid artery. The Select catheter was removed and the 058 guide catheter was coaxially introduced over the microcatheter and microwire. The microcatheter was then advanced under roadmap guidance into the right middle cerebral artery. The guide catheter was then tracked over the microcatheter to its final position in the distal cavernous right internal carotid artery. The microcatheter was then removed and the coiling catheter introduced over the microwire. The catheter was then navigated into the aneurysm lumen over the microwire. The wire was then removed. The above coils were then sequentially deployed with progressive exclusion of the aneurysm. Cerebral angiography was performed immediately post embolization. The coiling catheter was then removed and the guide catheter withdrawn into the cervical internal carotid artery. Final control angiography was then performed from the guide  catheter. The guide catheter and guide sheath were then synchronously removed without incident. FINDINGS: Right internal carotid, head: Injection reveals the presence of a widely patent ICA, M1, and A1 segments and their branches. There is an aneurysm arising from the origin of the right posterior communicating artery projecting laterally measuring approximately 6.2 x 6.1 x 8.9 mm. The right posterior cerebral artery appears to be fetal type, and arises from the neck of the aneurysm. Another aneurysm is identified at the bifurcation of the A2 segment into the pericallosal and callosal marginal arteries. This aneurysm measures approximately 3.9 x 4.6 mm. The parenchymal and venous phases are normal. The venous sinuses are widely patent. Left internal carotid, head: Injection reveals the presence of a widely patent ICA, A1, and M1 segments and their branches. There is a medially projecting apparently wide necked aneurysm arising from the horizontal segment of the cavernous left internal carotid artery. This aneurysm measures approximately 8.0 x 5.6 mm. The parenchymal and venous phases are normal. The venous sinuses are widely patent. Left vertebral: Injection reveals the presence of a widely patent vertebral artery. There is very  mild FMD type changes in the distal V2 and V3 segments of the left vertebral artery. There is no stenosis. The basilar artery is patent and terminates in left P1, while the right P1 is noted to be hypoplastic. The basilar apex is normal. No aneurysms, AVMs, or high-flow fistulas are seen. The parenchymal and venous phases are normal. The venous sinuses are widely patent. Right internal carotid artery, head (during embolization): Injection reveals the presence of a widely patent ICA that leads to a patent ACA and MCA. Angiograms taken during coiling demonstrate stability of the coil mass within the aneurysm, without coil prolapse or filling defect to suggest thrombus. Angiograms also  demonstrate progressive exclusion of the aneurysm. The fetal type PCA also remains widely patent. Right internal carotid artery, head (immediate post-embolization): Injection reveals the presence of a widely patent ICA that leads to a patent ACA and MCA. The fetal type right PCA is also widely patent. Coil mass within the aneurysm is stable, without coil prolapse or filling defect to suggest thrombus. No aneurysm filling is seen. Right internal carotid artery, head (final control): Injection reveals the presence of a widely patent ICA that leads to a patent ACA and MCA. The fetal type right PCA is widely patent. No thrombus is visualized. Coil mass is seen within the aneurysm and is in stable position. The parenchymal and venous phases are unremarkable. Right femoral: Normal vessel. No significant atherosclerotic disease. Arterial sheath in adequate position. DISPOSITION: Upon completion of the study, the femoral sheath was removed and hemostasis obtained using a 7-Fr ExoSeal closure device. Good proximal and distal lower extremity pulses were documented upon achievement of hemostasis. The procedure was well tolerated and no early complications were observed. The patient was transferred to the postanesthesia care unit in stable hemodynamic condition. IMPRESSION: 1. 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 Fetal type right posterior cerebral artery remains widely patent. 2.  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 3. 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 The preliminary results of this procedure were shared with the patient's family. Electronically Signed   By: Consuella Lose   On: 02/04/2020 17:10   US AORTA  Result Date: 02/05/2020 CLINICAL DATA:  Abdominal aortic aneurysm EXAM: ULTRASOUND OF ABDOMINAL AORTA TECHNIQUE: Ultrasound examination of the  abdominal aorta and proximal common iliac arteries was performed to evaluate for aneurysm. Additional color and Doppler images of the distal aorta were obtained to document patency. COMPARISON:  CT angiogram chest including portions of upper abdomen January 23, 2020 FINDINGS: Abdominal aortic measurements as follows: Proximal:  2.8 x 2.7 cm Mid:  3.4 x 3.1 cm Distal:  4.2 x 4.6 cm Patent: Yes, peak systolic velocity is 46 cm/s Right common iliac artery: 1.1 x 1.0  cm Left common iliac artery: 1.1 x 1.2 cm cm There is extensive atherosclerotic plaque in the aorta with areas of peripheral wall calcification. In the area of the aneurysm distally, there is decreased echogenicity surrounding the lumen, likely chronic thrombus. No periaortic fluid or adenopathy. Note that the area of aneurysmal dilatation measures approximately 4.3 cm in length. IMPRESSION: Distal abdominal aortic aneurysm with maximum transverse diameter of 4.2 x 4.6 cm. Aneurysm extends over approximately 4.6 cm. Peripheral thrombus noted in the area of aneurysm. There is aortic atherosclerosis.  No periaortic fluid or adenopathy. Recommend followup by abdomen and pelvis CTA in 6 months, and vascular surgery referral/consultation if not already  obtained. This recommendation follows ACR consensus guidelines: White Paper of the ACR Incidental Findings Committee II on Vascular Findings. J Am Coll Radiol 2013; 10:789-794. Electronically Signed   By: Lowella Grip III M.D.   On: 02/05/2020 08:03   VAS [place] TRANSCRANIAL DOPPLER  Result Date: 02/01/2020  Transcranial Doppler Indications: Subarachnoid hemorrhage. Limitations: poor acoustic windows Comparison Study: 01/28/2020 TCD Performing Technologist: Maudry Mayhew MHA, RDMS, RVT, RDCS  Examination Guidelines: A complete evaluation includes B-mode imaging, spectral Doppler, color Doppler, and power Doppler as needed of all accessible portions of each vessel. Bilateral testing is considered an  integral part of a complete examination. Limited examinations for reoccurring indications may be performed as noted.  +----------+-------------+----------+-----------+------------------+ RIGHT TCD Right VM (cm)Depth (cm)Pulsatility     Comment       +----------+-------------+----------+-----------+------------------+ MCA                                         Unable to insonate +----------+-------------+----------+-----------+------------------+ ACA                                         Unable to insonate +----------+-------------+----------+-----------+------------------+ Term ICA                                    Unable to insonate +----------+-------------+----------+-----------+------------------+ PCA           27.00                 1.21                       +----------+-------------+----------+-----------+------------------+ Opthalmic     22.00                 1.46                       +----------+-------------+----------+-----------+------------------+ ICA siphon                                  Unable to insonate +----------+-------------+----------+-----------+------------------+ Vertebral                                   Unable to insonate +----------+-------------+----------+-----------+------------------+  +----------+------------+----------+-----------+------------------+ LEFT TCD  Left VM (cm)Depth (cm)Pulsatility     Comment       +----------+------------+----------+-----------+------------------+ MCA          51.00                 1.34                       +----------+------------+----------+-----------+------------------+ ACA                                        Unable to insonate +----------+------------+----------+-----------+------------------+ Term ICA     49.00                 1.34                       +----------+------------+----------+-----------+------------------+  PCA                                         Unable to insonate +----------+------------+----------+-----------+------------------+ Opthalmic    18.00                 1.62                       +----------+------------+----------+-----------+------------------+ ICA siphon                                 Unable to insonate +----------+------------+----------+-----------+------------------+ Vertebral    -42.00                1.40                       +----------+------------+----------+-----------+------------------+ Distal ICA   33.00                                            +----------+------------+----------+-----------+------------------+  +------------+-------+------------------+             VM cm/s     Comment       +------------+-------+------------------+ Prox Basilar-56.00                    +------------+-------+------------------+ Dist Basilar       Unable to insonate +------------+-------+------------------+ +---------------------+----+ Left Lindegaard Ratio1.55 +---------------------+----+  Summary:  most of the right-sided intracranial vessels were not insonated due to lack of right transtemporal window. Among all insonated vessels, MFVs and flow directions were with normal limits except slightly elevated basilar artery flow velocity. Globally increased pulsatility suggesting increased intracranial pressure, however, this has improved from last TCD.  *See table(s) above for TCD measurements and observations.  Diagnosing physician: Rosalin Hawking MD Electronically signed by Rosalin Hawking MD on 02/01/2020 at 5:26:08 PM.    Final    VAS [place] TRANSCRANIAL DOPPLER  Result Date: 01/28/2020  Transcranial Doppler Indications: Subarachnoid hemorrhage. Limitations: Poor acoustic windows. Patient sitting in chair. Comparison Study: 01/26/2020 Performing Technologist: Maudry Mayhew MHA, RDMS, RVT, RDCS  Examination Guidelines: A complete evaluation includes B-mode imaging, spectral Doppler, color Doppler, and power  Doppler as needed of all accessible portions of each vessel. Bilateral testing is considered an integral part of a complete examination. Limited examinations for reoccurring indications may be performed as noted.  +----------+-------------+----------+-----------+------------------+ RIGHT TCD Right VM (cm)Depth (cm)Pulsatility     Comment       +----------+-------------+----------+-----------+------------------+ MCA                                         Unable to insonate +----------+-------------+----------+-----------+------------------+ ACA                                         Unable to insonate +----------+-------------+----------+-----------+------------------+ Term ICA  Unable to insonate +----------+-------------+----------+-----------+------------------+ PCA           26.00                 1.75                       +----------+-------------+----------+-----------+------------------+ Opthalmic     30.00                 1.99                       +----------+-------------+----------+-----------+------------------+ ICA siphon                                  Unable to insonate +----------+-------------+----------+-----------+------------------+ Vertebral                                   Unable to insonate +----------+-------------+----------+-----------+------------------+ Distal ICA    33.00                 1.62                       +----------+-------------+----------+-----------+------------------+  +----------+------------+----------+-----------+------------------+ LEFT TCD  Left VM (cm)Depth (cm)Pulsatility     Comment       +----------+------------+----------+-----------+------------------+ MCA          54.00                 1.62                       +----------+------------+----------+-----------+------------------+ ACA                                        Unable to insonate  +----------+------------+----------+-----------+------------------+ Term ICA     34.00                 1.66                       +----------+------------+----------+-----------+------------------+ PCA                                        Unable to insonate +----------+------------+----------+-----------+------------------+ Opthalmic    19.00                 2.06                       +----------+------------+----------+-----------+------------------+ ICA siphon                                 Unable to insonate +----------+------------+----------+-----------+------------------+ Vertebral                                  Unable to insonate +----------+------------+----------+-----------+------------------+ Distal ICA   29.00                 1.05                       +----------+------------+----------+-----------+------------------+  +---------------------+----+  Left Lindegaard Ratio1.86 +---------------------+----+  Summary:  limited intracranial vessels were able to be insonated. Among these vessels, MVFs and flow directions were within normal limits. no evidence of vasospasm, stenosis or occlusion. However, global elevated pulsatility indicating increased intracranial pressure. clincal correlation recommended. *See table(s) above for TCD measurements and observations.  Diagnosing physician: Rosalin Hawking MD Electronically signed by Rosalin Hawking MD on 01/28/2020 at 7:24:16 PM.    Final    VAS [place] TRANSCRANIAL DOPPLER  Result Date: 01/27/2020  Transcranial Doppler Indications: Subarachnoid hemorrhage. Limitations for diagnostic windows: Unable to insonate right transtemporal window. Comparison Study: 01/24/2012 Performing Technologist: Oliver Hum RVT  Examination Guidelines: A complete evaluation includes B-mode imaging, spectral Doppler, color Doppler, and power Doppler as needed of all accessible portions of each vessel. Bilateral testing is considered an integral part  of a complete examination. Limited examinations for reoccurring indications may be performed as noted.  +----------+-------------+----------+-----------+-------+ RIGHT TCD Right VM (cm)Depth (cm)PulsatilityComment +----------+-------------+----------+-----------+-------+ Opthalmic     12.00                 1.29            +----------+-------------+----------+-----------+-------+ ICA siphon    38.00                 1.43            +----------+-------------+----------+-----------+-------+ Distal ICA    20.80                 0.62            +----------+-------------+----------+-----------+-------+  +----------+------------+----------+-----------+-------+ LEFT TCD  Left VM (cm)Depth (cm)PulsatilityComment +----------+------------+----------+-----------+-------+ MCA          59.00                 1.54            +----------+------------+----------+-----------+-------+ Term ICA     30.00                 1.48            +----------+------------+----------+-----------+-------+ Opthalmic    25.00                 1.51            +----------+------------+----------+-----------+-------+ ICA siphon   46.00                 1.51            +----------+------------+----------+-----------+-------+ Vertebral    -28.00                1.21            +----------+------------+----------+-----------+-------+ Distal ICA   24.10                 0.72            +----------+------------+----------+-----------+-------+  +---------------------+----+ Left Lindegaard Ratio2.46 +---------------------+----+  Summary:  multiple intracranial vessels were not able to insonated due to lack of windows. Among the intracranial vessels insonated, MVFs and flow direction are within normal limits. No evidence of vasospasm, stenosis or occlusion indicated. However, with global increase of pulsatility suggesting increased intracranial pressure. clinical correlation is recommended. *See  table(s) above for TCD measurements and observations.  Diagnosing physician: Rosalin Hawking MD Electronically signed by Rosalin Hawking MD on 01/27/2020 at 6:50:51 PM.    Final    VAS [place] TRANSCRANIAL DOPPLER  Result Date: 01/26/2020  Transcranial Doppler Indications: Subarachnoid hemorrhage. Limitations for diagnostic windows: Unable to  insonate right transtemporal window. Comparison Study: no prior Performing Technologist: Abram Sander RVS  Examination Guidelines: A complete evaluation includes B-mode imaging, spectral Doppler, color Doppler, and power Doppler as needed of all accessible portions of each vessel. Bilateral testing is considered an integral part of a complete examination. Limited examinations for reoccurring indications may be performed as noted.  +----------+-------------+----------+-----------+---------+ RIGHT TCD Right VM (cm)Depth (cm)Pulsatility Comment  +----------+-------------+----------+-----------+---------+ MCA                                         no window +----------+-------------+----------+-----------+---------+ ACA                                         no window +----------+-------------+----------+-----------+---------+ Term ICA                                    no window +----------+-------------+----------+-----------+---------+ PCA                                         no window +----------+-------------+----------+-----------+---------+ Opthalmic     20.00                 1.89              +----------+-------------+----------+-----------+---------+ ICA siphon    75.00                 1.62              +----------+-------------+----------+-----------+---------+ Vertebral     17.00                                   +----------+-------------+----------+-----------+---------+  +----------+------------+----------+-----------+-------------+ LEFT TCD  Left VM (cm)Depth (cm)Pulsatility   Comment     +----------+------------+----------+-----------+-------------+ MCA          17.00                 1.68                  +----------+------------+----------+-----------+-------------+ ACA                                        not insonated +----------+------------+----------+-----------+-------------+ Term ICA     41.00                 1.39                  +----------+------------+----------+-----------+-------------+ PCA          30.00                 1.39                  +----------+------------+----------+-----------+-------------+ Opthalmic    22.00                 1.91                  +----------+------------+----------+-----------+-------------+ ICA siphon   51.00  1.34                  +----------+------------+----------+-----------+-------------+ Vertebral                                                +----------+------------+----------+-----------+-------------+  +------------+-------+-------------+             VM cm/s   Comment    +------------+-------+-------------+ Prox Basilar       not insonated +------------+-------+-------------+ Dist Basilar       not insonated +------------+-------+-------------+ Summary:  most of the right-sided intracranial vessels as well as posterior vessels were not able to be insonated due to lack of windows. Among all insonated vessels, MFVs and flow directions were with normal limits except right ICA siphon with elevated velocity indicating mild stenosis. In addition, globally increased pulsatility concerning for increased intracranial pressure. Clinical correlation recommended. *See table(s) above for TCD measurements and observations.  Diagnosing physician: Rosalin Hawking MD Electronically signed by Rosalin Hawking MD on 01/26/2020 at 9:07:37 AM.    Final    IR ANGIO INTRA EXTRACRAN SEL INTERNAL CAROTID BILAT MOD SED  Result Date: 02/04/2020 PROCEDURE: DIAGNOSTIC CEREBRAL ANGIOGRAM COIL EMBOLIZATION OF  RIGHT POSTERIOR COMMUNICATING ARTERY ANEURYSM HISTORY: The patient is a 74 year old woman initially presenting to the hospital with memory loss and confusion, with headache. CT scan did demonstrate subarachnoid hemorrhage right greater than left, with CT angiogram demonstrating right-sided posterior communicating artery aneurysm as well as more distal anterior cerebral artery aneurysm. She therefore presents for further workup with diagnostic cerebral angiogram and possible coil embolization of the right-sided aneurysm. ACCESS: The technical aspects of the procedure as well as its potential risks and benefits were reviewed with the patient and the patient's family. These risks included but were not limited to stroke, intracranial hemorrhage, bleeding, infection, allergic reaction, damage to organs or vital structures, stroke, non-diagnostic procedure, and the catastrophic outcomes of heart attack, coma, and death. With an understanding of these risks, informed consent was obtained and witnessed. The patient was placed in the supine position on the angiography table and the skin of right groin prepped in the usual sterile fashion. The procedure was performed under general anesthesia. A 5-French sheath was introduced in the right common femoral artery using Seldinger technique. MEDICATIONS: HEPARIN: 0 Units total. CONTRAST:  5m OMNIPAQUE IOHEXOL 300 MG/ML  SOLNcc, Omnipaque 300 FLUOROSCOPY TIME:  FLUOROSCOPY TIME: See IR records TECHNIQUE: CATHETERS AND WIRES 5-French JB-1 catheter 180 cm 0.035" glidewire 6-French NeuronMax guide sheath 6-French Berenstein Select JB-1 catheter 0.058" CatV guidecatheter 150 cm Phenom microcatheter Synchro 2 standard microwire Excelsior XT-17 microcatheter COILS USED Target XL 360 soft 7 mm x 20 mm Target XL 360 soft 6 mm x 20 mm Target 360 XL soft 5 mm x 10 mm Target XL 360 soft 3 mm x 9 mm VESSELS CATHETERIZED Right internal carotid Left internal carotid Left vertebral Right common  femoral Right middle cerebral artery VESSELS STUDIED Right internal carotid, head Left internal carotid, head Left vertebral Right internal carotid artery, head (during embolization) Right internal carotid artery, head (immediate post-embolization) Right internal carotid artery, head (final control) Right common femoral PROCEDURAL NARRATIVE A 5-Fr JB-1 glide catheter was advanced over a 0.035 glidewire into the aortic arch. The above vessels were then sequentially catheterized and cervical / cerebral angiograms taken. After review of images, the catheter was removed without  incident. The 5-Fr sheath was then exchanged over the glidewire for an 8-Fr sheath. Under real-time fluoroscopy, the guide sheath was advanced over the select catheter and glidewire into the descending aorta. The select catheter was then advanced into the cervical right internal carotid artery. The guide sheath was then advanced into the proximal cervical right internal carotid artery. The Select catheter was removed and the 058 guide catheter was coaxially introduced over the microcatheter and microwire. The microcatheter was then advanced under roadmap guidance into the right middle cerebral artery. The guide catheter was then tracked over the microcatheter to its final position in the distal cavernous right internal carotid artery. The microcatheter was then removed and the coiling catheter introduced over the microwire. The catheter was then navigated into the aneurysm lumen over the microwire. The wire was then removed. The above coils were then sequentially deployed with progressive exclusion of the aneurysm. Cerebral angiography was performed immediately post embolization. The coiling catheter was then removed and the guide catheter withdrawn into the cervical internal carotid artery. Final control angiography was then performed from the guide catheter. The guide catheter and guide sheath were then synchronously removed without incident.  FINDINGS: Right internal carotid, head: Injection reveals the presence of a widely patent ICA, M1, and A1 segments and their branches. There is an aneurysm arising from the origin of the right posterior communicating artery projecting laterally measuring approximately 6.2 x 6.1 x 8.9 mm. The right posterior cerebral artery appears to be fetal type, and arises from the neck of the aneurysm. Another aneurysm is identified at the bifurcation of the A2 segment into the pericallosal and callosal marginal arteries. This aneurysm measures approximately 3.9 x 4.6 mm. The parenchymal and venous phases are normal. The venous sinuses are widely patent. Left internal carotid, head: Injection reveals the presence of a widely patent ICA, A1, and M1 segments and their branches. There is a medially projecting apparently wide necked aneurysm arising from the horizontal segment of the cavernous left internal carotid artery. This aneurysm measures approximately 8.0 x 5.6 mm. The parenchymal and venous phases are normal. The venous sinuses are widely patent. Left vertebral: Injection reveals the presence of a widely patent vertebral artery. There is very mild FMD type changes in the distal V2 and V3 segments of the left vertebral artery. There is no stenosis. The basilar artery is patent and terminates in left P1, while the right P1 is noted to be hypoplastic. The basilar apex is normal. No aneurysms, AVMs, or high-flow fistulas are seen. The parenchymal and venous phases are normal. The venous sinuses are widely patent. Right internal carotid artery, head (during embolization): Injection reveals the presence of a widely patent ICA that leads to a patent ACA and MCA. Angiograms taken during coiling demonstrate stability of the coil mass within the aneurysm, without coil prolapse or filling defect to suggest thrombus. Angiograms also demonstrate progressive exclusion of the aneurysm. The fetal type PCA also remains widely patent. Right  internal carotid artery, head (immediate post-embolization): Injection reveals the presence of a widely patent ICA that leads to a patent ACA and MCA. The fetal type right PCA is also widely patent. Coil mass within the aneurysm is stable, without coil prolapse or filling defect to suggest thrombus. No aneurysm filling is seen. Right internal carotid artery, head (final control): Injection reveals the presence of a widely patent ICA that leads to a patent ACA and MCA. The fetal type right PCA is widely patent. No thrombus is visualized. Coil  mass is seen within the aneurysm and is in stable position. The parenchymal and venous phases are unremarkable. Right femoral: Normal vessel. No significant atherosclerotic disease. Arterial sheath in adequate position. DISPOSITION: Upon completion of the study, the femoral sheath was removed and hemostasis obtained using a 7-Fr ExoSeal closure device. Good proximal and distal lower extremity pulses were documented upon achievement of hemostasis. The procedure was well tolerated and no early complications were observed. The patient was transferred to the postanesthesia care unit in stable hemodynamic condition. IMPRESSION: 1. 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 Fetal type right posterior cerebral artery remains widely patent. 2.  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 3. 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 The preliminary results of this procedure were shared with the patient's family. Electronically Signed   By: Consuella Lose   On: 02/04/2020 17:10   IR ANGIO VERTEBRAL SEL VERTEBRAL UNI L MOD SED  Result Date: 02/04/2020 PROCEDURE: DIAGNOSTIC CEREBRAL ANGIOGRAM COIL EMBOLIZATION OF RIGHT POSTERIOR COMMUNICATING ARTERY ANEURYSM HISTORY: The patient is a 74 year old woman initially presenting to the hospital  with memory loss and confusion, with headache. CT scan did demonstrate subarachnoid hemorrhage right greater than left, with CT angiogram demonstrating right-sided posterior communicating artery aneurysm as well as more distal anterior cerebral artery aneurysm. She therefore presents for further workup with diagnostic cerebral angiogram and possible coil embolization of the right-sided aneurysm. ACCESS: The technical aspects of the procedure as well as its potential risks and benefits were reviewed with the patient and the patient's family. These risks included but were not limited to stroke, intracranial hemorrhage, bleeding, infection, allergic reaction, damage to organs or vital structures, stroke, non-diagnostic procedure, and the catastrophic outcomes of heart attack, coma, and death. With an understanding of these risks, informed consent was obtained and witnessed. The patient was placed in the supine position on the angiography table and the skin of right groin prepped in the usual sterile fashion. The procedure was performed under general anesthesia. A 5-French sheath was introduced in the right common femoral artery using Seldinger technique. MEDICATIONS: HEPARIN: 0 Units total. CONTRAST:  44m OMNIPAQUE IOHEXOL 300 MG/ML  SOLNcc, Omnipaque 300 FLUOROSCOPY TIME:  FLUOROSCOPY TIME: See IR records TECHNIQUE: CATHETERS AND WIRES 5-French JB-1 catheter 180 cm 0.035" glidewire 6-French NeuronMax guide sheath 6-French Berenstein Select JB-1 catheter 0.058" CatV guidecatheter 150 cm Phenom microcatheter Synchro 2 standard microwire Excelsior XT-17 microcatheter COILS USED Target XL 360 soft 7 mm x 20 mm Target XL 360 soft 6 mm x 20 mm Target 360 XL soft 5 mm x 10 mm Target XL 360 soft 3 mm x 9 mm VESSELS CATHETERIZED Right internal carotid Left internal carotid Left vertebral Right common femoral Right middle cerebral artery VESSELS STUDIED Right internal carotid, head Left internal carotid, head Left vertebral  Right internal carotid artery, head (during embolization) Right internal carotid artery, head (immediate post-embolization) Right internal carotid artery, head (final control) Right common femoral PROCEDURAL NARRATIVE A 5-Fr JB-1 glide catheter was advanced over a 0.035 glidewire into the aortic arch. The above vessels were then sequentially catheterized and cervical / cerebral angiograms taken. After review of images, the catheter was removed without incident. The 5-Fr sheath was then exchanged over the glidewire for an 8-Fr sheath. Under real-time fluoroscopy, the guide sheath was advanced over the select catheter and glidewire into the descending aorta. The select catheter was then advanced into the cervical  right internal carotid artery. The guide sheath was then advanced into the proximal cervical right internal carotid artery. The Select catheter was removed and the 058 guide catheter was coaxially introduced over the microcatheter and microwire. The microcatheter was then advanced under roadmap guidance into the right middle cerebral artery. The guide catheter was then tracked over the microcatheter to its final position in the distal cavernous right internal carotid artery. The microcatheter was then removed and the coiling catheter introduced over the microwire. The catheter was then navigated into the aneurysm lumen over the microwire. The wire was then removed. The above coils were then sequentially deployed with progressive exclusion of the aneurysm. Cerebral angiography was performed immediately post embolization. The coiling catheter was then removed and the guide catheter withdrawn into the cervical internal carotid artery. Final control angiography was then performed from the guide catheter. The guide catheter and guide sheath were then synchronously removed without incident. FINDINGS: Right internal carotid, head: Injection reveals the presence of a widely patent ICA, M1, and A1 segments and their  branches. There is an aneurysm arising from the origin of the right posterior communicating artery projecting laterally measuring approximately 6.2 x 6.1 x 8.9 mm. The right posterior cerebral artery appears to be fetal type, and arises from the neck of the aneurysm. Another aneurysm is identified at the bifurcation of the A2 segment into the pericallosal and callosal marginal arteries. This aneurysm measures approximately 3.9 x 4.6 mm. The parenchymal and venous phases are normal. The venous sinuses are widely patent. Left internal carotid, head: Injection reveals the presence of a widely patent ICA, A1, and M1 segments and their branches. There is a medially projecting apparently wide necked aneurysm arising from the horizontal segment of the cavernous left internal carotid artery. This aneurysm measures approximately 8.0 x 5.6 mm. The parenchymal and venous phases are normal. The venous sinuses are widely patent. Left vertebral: Injection reveals the presence of a widely patent vertebral artery. There is very mild FMD type changes in the distal V2 and V3 segments of the left vertebral artery. There is no stenosis. The basilar artery is patent and terminates in left P1, while the right P1 is noted to be hypoplastic. The basilar apex is normal. No aneurysms, AVMs, or high-flow fistulas are seen. The parenchymal and venous phases are normal. The venous sinuses are widely patent. Right internal carotid artery, head (during embolization): Injection reveals the presence of a widely patent ICA that leads to a patent ACA and MCA. Angiograms taken during coiling demonstrate stability of the coil mass within the aneurysm, without coil prolapse or filling defect to suggest thrombus. Angiograms also demonstrate progressive exclusion of the aneurysm. The fetal type PCA also remains widely patent. Right internal carotid artery, head (immediate post-embolization): Injection reveals the presence of a widely patent ICA that leads  to a patent ACA and MCA. The fetal type right PCA is also widely patent. Coil mass within the aneurysm is stable, without coil prolapse or filling defect to suggest thrombus. No aneurysm filling is seen. Right internal carotid artery, head (final control): Injection reveals the presence of a widely patent ICA that leads to a patent ACA and MCA. The fetal type right PCA is widely patent. No thrombus is visualized. Coil mass is seen within the aneurysm and is in stable position. The parenchymal and venous phases are unremarkable. Right femoral: Normal vessel. No significant atherosclerotic disease. Arterial sheath in adequate position. DISPOSITION: Upon completion of the study, the femoral sheath was  removed and hemostasis obtained using a 7-Fr ExoSeal closure device. Good proximal and distal lower extremity pulses were documented upon achievement of hemostasis. The procedure was well tolerated and no early complications were observed. The patient was transferred to the postanesthesia care unit in stable hemodynamic condition. IMPRESSION: 1. 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 Fetal type right posterior cerebral artery remains widely patent. 2.  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 3. 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 The preliminary results of this procedure were shared with the patient's family. Electronically Signed   By: Consuella Lose   On: 02/04/2020 17:10    Labs:  Basic Metabolic Panel: Recent Labs  Lab 02/04/20 0505 02/06/20 0526 02/09/20 0915  NA 137 140 133*  K 3.3* 4.5 4.2  CL 107 110 104  CO2 21* 21* 18*  GLUCOSE 103* 108* 114*  BUN 5* 10 18  CREATININE 0.52 0.52 0.70  CALCIUM 8.8* 9.3 9.8    CBC: CBC Latest Ref Rng & Units 02/09/2020 02/01/2020 01/23/2020  WBC 4.0 - 10.5 K/uL 10.6(H) 13.0(H) 12.1(H)  Hemoglobin 12.0  - 15.0 g/dL 12.8 10.6(L) 10.7(L)  Hematocrit 36.0 - 46.0 % 40.2 31.4(L) 32.2(L)  Platelets 150 - 400 K/uL 409(H) 320 217    CBG: No results for input(s): GLUCAP in the last 168 hours.  Brief HPI:   DENEKA GREENWALT is a 74 y.o. female with history of COPD, vertigo; who was admitted via [facility] on 01/23/2020 with mild headache, lightheadedness upper neck pain with reports of blood near her bed and no recall of 48 hours prior to admission.  CT head showed extensive SAH about right cerebral hemisphere and basal cisterns.  CTA head/neck revealed 9 mm aneurysm of communicating segment of right ICA which was likely the culprit for hemorrhage and additional approximately 4 mm aneurysm right A2 ACA.  She was transferred to [facility] for work-up and underwent cerebral angio with successful coil embolization of right PCA aneurysm.  She was placed on Nimotop for supportive care x21 days and postprocedure continued to have issues with significant neck pain and headaches.  Methylprednisolone was added on 3/20 to help ameliorate symptoms.  Therapy was ongoing and patient was noted to be limited by pain, cognitive deficits, balance deficits as well as dizziness and nausea.  CIR was recommended to functional deficits.   Hospital Course: CHARRIE MCCONNON was admitted to rehab 01/31/2020 for inpatient therapies to consist of PT, ST and OT at least three hours five days a week. Past admission physiatrist, therapy team and rehab RN have worked together to provide customized collaborative inpatient rehab.  Blood pressures were monitored on TID basis and and have shown some orthostatic drops in the last 24 hours.  Amlodipine was decreased to 2.5 mg daily and patient was advised to increase fluid intake.  Serial check of BMET showed hypokalemia and treated was added for supplement with resolution.  Recommend repeat be met in 5 to 7 days for stability.  Follow-up CBC shows leukocytosis is resolving and ABLA has resolved..   No signs of infection noted in rising white count likely due to steroids.  She was weaned off steroids but started reporting increasing low back pain.  X-rays of low back were negative for fracture and showed mild arthritis and incidental infrarenal aneurysm.  Neurosurgery felt the symptoms likely due to meningeal irritation that will resolve in the next  1 to 2 months.  Decadron was added for a week to with symptoms.  Voltaren gel and Tylenol has been used for pain with limited use of narcotics. Aortic ultrasound was ordered for work-up and this  showed 4.6 cm abdominal aortic aneurysm.    Dr. Oneida Alar was consulted for input and exam without focal weakness. He recommended follow-up in clinic in 6 months with repeat ultrasound--to consider for repair if aneurysm reaches 5- 5.5 cm.   She is continent of bowel and bladder. She has made gains during rehab stay and is currently at supervision level. Family expressed concerns about providing care needed and SNF search initiated. Her length of stay was extended due to search but insurance denied SNF as she was felt to be custodial care. Supervision was recommended due to safety and this was discussed with multiple family members who plan on deciding if patient will stay local or discharge with sister in [place].  She was discharged to home with [facility] services on 02/12/2020.   Rehab course: During patient's stay in rehab weekly team conferences were held to monitor patient's progress, set goals and discuss barriers to discharge. At admission, patient required min assist with mobility and min assist with ADL tasks. Mild cognitive deficits noted with decrease in recall, problem-solving and attention to task.  She  has had improvement in activity tolerance, balance, postural control as well as ability to compensate for deficits.  She is able to complete ADL tasks with supervision.  She is independent for transfers and is able to ambulate 500 feet with use of rolling walker and  requires supervision with verbal cues for sequencing and safety. She has refused to participate in [place] consistently and continues to have poor frustration tolerance with poor awareness of deficits. Family education was completed regarding all aspects of safety.    Disposition:  Home   Diet: Regular  Special Instructions: 1. Will need BMET repeated in 5-7 days to monitor potassium levels. 2. Kpat/ice to lower back prn.    Discharge Instructions    Ambulatory referral to Physical Medicine Rehab   Complete by: As directed    1-2 weeks TC appointment     Allergies as of 02/12/2020      Reactions   Latex Hives      Medication List    STOP taking these medications   methylPREDNISolone 4 MG Tbpk tablet Commonly known as: MEDROL DOSEPAK     TAKE these medications   acetaminophen 325 MG tablet Commonly known as: TYLENOL Take 650 mg by mouth every 6 (six) hours as needed for headache (pain).   albuterol 108 (90 Base) MCG/ACT inhaler Commonly known as: VENTOLIN HFA Inhale 2 puffs into the lungs every 6 (six) hours as needed for wheezing or shortness of breath.   amLODipine 2.5 MG tablet Commonly known as: NORVASC Take 1 tablet (2.5 mg total) by mouth daily.   Breo Ellipta 100-25 MCG/INH Aepb Generic drug: fluticasone furoate-vilanterol Inhale 1 puff into the lungs daily.   butalbital-acetaminophen-caffeine 50-325-40 MG tablet Commonly known as: FIORICET Take 1-2 tablets by mouth every 4 (four) hours as needed for headache.   CALCIUM 600 PO Take 600 mg by mouth at bedtime.   Cholecalciferol 50 MCG (2000 UT) Caps Take 2,000 Units by mouth at bedtime.   diclofenac Sodium 1 % Gel Commonly known as: VOLTAREN Apply 2 g topically 4 (four) times daily. To left paraspinals/traps   fluticasone 50 MCG/ACT nasal spray Commonly known as: FLONASE Place 2 sprays into  both nostrils daily.   loratadine 10 MG tablet Commonly known as: CLARITIN Take 10 mg by mouth at bedtime.    Muscle Rub 10-15 % Crea Apply 1 application topically 2 (two) times daily as needed for muscle pain. To lower back   niMODipine 30 MG capsule Commonly known as: NIMOTOP Take 2 capsules (60 mg total) by mouth every 4 (four) hours. Thorough 02/13/19 What changed: additional instructions   pantoprazole 40 MG tablet Commonly known as: PROTONIX Take 1 tablet (40 mg total) by mouth daily.   polyethylene glycol 17 g packet Commonly known as: MIRALAX / GLYCOLAX Take 17 g by mouth 2 (two) times daily.   potassium chloride SA 15 MEQ tablet Commonly known as: KLOR-CON M15 Take 2 tablets (30 mEq total) by mouth 2 (two) times daily.   topiramate 25 MG tablet Commonly known as: TOPAMAX Take 1 tablet (25 mg total) by mouth at bedtime.   traMADol 50 MG tablet--Rx# 15 pills.  Commonly known as: ULTRAM Take 1 tablet (50 mg total) by mouth every 6 (six) hours as needed for moderate pain.       Contact information for follow-up providers    Kirsteins, Luanna Salk, MD Follow up.   Specialty: Physical Medicine and Rehabilitation Why: Office will call you with follow up appointment Contact information: [place] [address] [phone]        Consuella Lose, MD. Call.   Specialty: Neurosurgery Why: for post op appointment Contact information: [address] [place] [address] [phone]        Dion Body, MD Follow up.   Specialty: Family Medicine Contact information: [place] [address] [phone]        Elam Dutch, MD Follow up on 08/12/2020.   Specialties: Vascular Surgery, Cardiology Why: Appointment at 8 am for ultrasound followed by MD visit. Contact information: [address] [place] [address] [phone]             Signed: Bary Leriche 02/17/2020, 10:10 AM

## 2020-02-09 NOTE — Plan of Care (Signed)
  Problem: RH Bathing Goal: LTG Patient will bathe all body parts with assist levels (OT) Description: LTG: Patient will bathe all body parts with assist levels (OT) Outcome: Not Met (add Reason) Note: Pt still at supervision level for bathing per most recent staff documentation    Problem: RH Dressing Goal: LTG Patient will perform upper body dressing (OT) Description: LTG Patient will perform upper body dressing with assist, with/without cues (OT). Outcome: Not Met (add Reason) Note: Pt still at supervision level for dressing per most recent staff documentation  Goal: LTG Patient will perform lower body dressing w/assist (OT) Description: LTG: Patient will perform lower body dressing with assist, with/without cues in positioning using equipment (OT) Outcome: Not Met (add Reason) Note: Pt still at supervision level for dressing per most recent staff documentation    Problem: RH Balance Goal: LTG Patient will maintain dynamic standing with ADLs (OT) Description: LTG:  Patient will maintain dynamic standing balance with assist during activities of daily living (OT)  Outcome: Completed/Met   Problem: Sit to Stand Goal: LTG:  Patient will perform sit to stand in prep for activites of daily living with assistance level (OT) Description: LTG:  Patient will perform sit to stand in prep for activites of daily living with assistance level (OT) Outcome: Completed/Met   Problem: RH Grooming Goal: LTG Patient will perform grooming w/assist,cues/equip (OT) Description: LTG: Patient will perform grooming with assist, with/without cues using equipment (OT) Outcome: Completed/Met   Problem: RH Toileting Goal: LTG Patient will perform toileting task (3/3 steps) with assistance level (OT) Description: LTG: Patient will perform toileting task (3/3 steps) with assistance level (OT)  Outcome: Completed/Met   Problem: RH Toilet Transfers Goal: LTG Patient will perform toilet transfers w/assist  (OT) Description: LTG: Patient will perform toilet transfers with assist, with/without cues using equipment (OT) Outcome: Completed/Met   Problem: RH Tub/Shower Transfers Goal: LTG Patient will perform tub/shower transfers w/assist (OT) Description: LTG: Patient will perform tub/shower transfers with assist, with/without cues using equipment (OT) Outcome: Completed/Met   Problem: RH Awareness Goal: LTG: Patient will demonstrate awareness during functional activites type of (OT) Description: LTG: Patient will demonstrate awareness during functional activites type of (OT) Outcome: Completed/Met

## 2020-02-09 NOTE — Progress Notes (Signed)
Per NT [place] Y. While NT was receiving orthostatic standing vitals pt fell back onto bed as if she passed out for 5 seconds. NT called nurse and nurse arrived in room. When this nurse arrived in room pt was sitting on bed taking deep breaths and stated she wanted to lay back. Pt stated she was nauseous and dizzy. Pt was offered water, crackers and PRN nausea medication. Pt refused all and stated she just wanted to lay back. Be advised orthostatic standing bp- 97/45, pulse-82. This nurse monitored pt for 5 minutes and pt was not in any further distress. No  Other problems to notate. Be

## 2020-02-09 NOTE — Progress Notes (Signed)
Occupational Therapy Session Note  Patient Details  Name: Autumn Le MRN: [id number] Date of Birth: May 13, 1946  Today's Date: 02/09/2020 OT Individual Time: 1000-1032 OT Individual Time Calculation (min): 32 min  28 minutes missed   Short Term Goals: Week 1:  OT Short Term Goal 1 (Week 1): STG = LTGs due to ELOS     Skilled Therapeutic Interventions/Progress Updates:    Pt greeted in bed with no c/o pain. She did c/o feeling dizzy and cold in earlier therapy session. Also reported she took a "nice hot shower" this AM, also completed toileting and oral care/grooming tasks before OT arrival. Discussed her IADL responsibilities at home, with pt reporting she is discharging home with her sister, Autumn Le. Per pt, she and Autumn Le will help each other with meal prep and laundry. Encouraged pt to transfer OOB so we can practice these IADL tasks together with pt refusing. Per pt, she already has a TTB that she will move from her home to [facility], and sister Autumn Le already has a 3:1 for nighttime toileting. Discussed pts leisure interests to establish report with pt reporting she likes to read and do puzzles. Encouraged pt to continue engaging in these meaningful tasks with family for psychosocial wellbeing. Pt very talkative regarding her family, especially her younger relatives. After providing pt with multiple opportunities to get OOB, even transfer to her bedside chair, she refused, describing the hospital furniture as "rotton" because it cuts off her circulation when she sits. Pt remained comfortably in bed at end of session, all needs within reach and bed alarm set. Time missed due to pt refusal to participate.    Therapy Documentation Precautions:  Precautions Precautions: Fall Precaution Comments: goal SBP<140 Restrictions Weight Bearing Restrictions: No Vital Signs: Therapy Vitals Pulse Rate: 66 BP: (!) 155/71 Pain: Pain Assessment Pain Scale: 0-10 Pain Score: 6  Pain Type: Acute pain Pain  Location: Head Pain Orientation: Lower Pain Descriptors / Indicators: Aching Pain Frequency: Constant Pain Onset: On-going Patients Stated Pain Goal: 1 Pain Intervention(s): Medication (See eMAR) ADL: ADL Eating: Independent Where Assessed-Eating: Bed level Grooming: Independent Where Assessed-Grooming: Standing at sink Upper Body Bathing: Setup Where Assessed-Upper Body Bathing: Shower Lower Body Bathing: Supervision/safety Where Assessed-Lower Body Bathing: Shower Upper Body Dressing: Setup Where Assessed-Upper Body Dressing: Chair Lower Body Dressing: Supervision/safety Where Assessed-Lower Body Dressing: Chair Toileting: Modified independent Where Assessed-Toileting: Teacher, adult education: Modified Community education officer Method: Ambulating(RW) Acupuncturist: Psychiatric nurse: Close supervision Film/video editor Method: Ambulating(RW) Astronomer: Emergency planning/management officer ADL Comments: pt can do much of her self care in standing      Therapy/Group: Individual Therapy  Marcela Alatorre A Aniela Caniglia 02/09/2020, 12:31 PM

## 2020-02-09 NOTE — Progress Notes (Signed)
[place] PHYSICAL MEDICINE & REHABILITATION PROGRESS NOTE   Subjective/Complaints: Appreciate VVS note  Oriented to person and place Orthostatic episde this am  ROS: Denies CP, SOB, N/V/D  Objective:   No results found. No results for input(s): WBC, HGB, HCT, PLT in the last 72 hours. No results for input(s): NA, K, CL, CO2, GLUCOSE, BUN, CREATININE, CALCIUM in the last 72 hours.  Intake/Output Summary (Last 24 hours) at 02/09/2020 0741 Last data filed at 02/08/2020 2037 Gross per 24 hour  Intake 616 ml  Output -  Net 616 ml     Physical Exam: Vital Signs Blood pressure (!) 97/45, pulse 82, temperature 98.3 F (36.8 C), temperature source Oral, resp. rate 16, height 5\' 4"  (1.626 m), weight 56.2 kg, SpO2 100 %.  General: No acute distress Mood and affect are appropriate Heart: Regular rate and rhythm no rubs murmurs or extra sounds Lungs: Clear to auscultation, breathing unlabored, no rales or wheezes Abdomen: Positive bowel sounds, soft nontender to palpation, nondistended Extremities: No clubbing, cyanosis, or edema Skin: No evidence of breakdown, no evidence of rash Neurologic: , motor strength is 5/5 in bilateral deltoid, bicep, tricep, grip, hip flexor, knee extensors, ankle dorsiflexor and plantar flexor   Assessment/Plan: 1. Functional deficits secondary to [facility]  which require 3+ hours per day of interdisciplinary therapy in a comprehensive inpatient rehab setting.  Physiatrist is providing close team supervision and 24 hour management of active medical problems listed below.  Physiatrist and rehab team continue to assess barriers to discharge/monitor patient progress toward functional and medical goals  Care Tool:  Bathing  Bathing activity did not occur: Refused Body parts bathed by patient: Right arm, Left arm, Chest, Abdomen, Front perineal area, Buttocks, Right upper leg, Left upper leg, Right lower leg, Left lower leg, Face         Bathing assist  Assist Level: Supervision/Verbal cueing     Upper Body Dressing/Undressing Upper body dressing Upper body dressing/undressing activity did not occur (including orthotics): Refused What is the patient wearing?: Bra, Pull over shirt    Upper body assist Assist Level: Set up assist    Lower Body Dressing/Undressing Lower body dressing    Lower body dressing activity did not occur: Refused What is the patient wearing?: Underwear/pull up, Pants     Lower body assist Assist for lower body dressing: Supervision/Verbal cueing Assistive Device Comment: walker   Toileting Toileting    Toileting assist Assist for toileting: Supervision/Verbal cueing     Transfers Chair/bed transfer  Transfers assist     Chair/bed transfer assist level: Supervision/Verbal cueing Chair/bed transfer assistive device: [facility]   Ambulation assist   Ambulation activity did not occur: Safety/medical concerns(pt with low K+ on eval, limited to bedside and transfers in the room per MD)  Assist level: Supervision/Verbal cueing Assistive device: Walker-rolling Max distance: 16ft   Walk 10 feet activity   Assist  Walk 10 feet activity did not occur: Safety/medical concerns  Assist level: Supervision/Verbal cueing Assistive device: Walker-rolling   Walk 50 feet activity   Assist Walk 50 feet with 2 turns activity did not occur: Safety/medical concerns  Assist level: Supervision/Verbal cueing Assistive device: Walker-rolling    Walk 150 feet activity   Assist Walk 150 feet activity did not occur: Safety/medical concerns  Assist level: Supervision/Verbal cueing Assistive device: Walker-rolling    Walk 10 feet on uneven surface  activity   Assist Walk 10 feet on uneven surfaces activity did not occur: Safety/medical  concerns(pt with low K+ on eval, limited to bedside and transfers in the room per MD)   Assist level: Contact Guard/Touching assist Assistive  device: Teacher, English as a foreign language activity did not occur: Safety/medical concerns(pt with low K+ on eval, limited to bedside and transfers in the room per MD)         Wheelchair 50 feet with 2 turns activity    Assist    Wheelchair 50 feet with 2 turns activity did not occur: Safety/medical concerns       Wheelchair 150 feet activity     Assist  Wheelchair 150 feet activity did not occur: Safety/medical concerns       Blood pressure (!) 97/45, pulse 82, temperature 98.3 F (36.8 C), temperature source Oral, resp. rate 16, height 5\' 4"  (1.626 m), weight 56.2 kg, SpO2 100 %.  Medical Problem List and Plan: 1. Decline in cognition and mobility secondary to Right SAH  Per neurosurgery, likely has meningeal irritation from subarachnoid blood causing her low back and extremity symptomatology.  Restarted dexamethasone for about 1 week 4 mg twice daily over the weekend and then will taper to 2 mg upon discharge to finish the course  Continue CIR 2. Antithrombotics: -DVT/anticoagulation:Mechanical:Sequential compression devices, below kneeBilateral lower extremities -antiplatelet therapy: N/a 3.Headaches/Pain Management:  As per Neurosurgery back leg and HA pain likely due to subarachnoid blood from aneurysm rupture, restarted steroids for several days No further imaging needed at this time   Increase Topamax   Added K pad for shoulder pain and continue robaxin prnfor shoulder pain. D/C ibuprofen.   Changed hydrocodone to oxycodone and monitor.  Added diclofenac gel and schedule tylenol.   Avoid side lying with HOB elevated, add sportscreme, trial tramadol for moderate pain, hydrocodone for more severe pain  Relatively controlled with medication on 3/28 4. Mood:LCSW to follow for evaluation and support. -antipsychotic agents: N/A 5. Neuropsych: This patientiscapable of making decisions on herown  behalf. 6. Skin/Wound Care:Routine pressure relief measures. 7. Fluids/Electrolytes/Nutrition:Monitor I/O. 8. H/o Meniere's disease:In remission. 9. COPD:Continue Breo.  Resumed claritin. 10. Nausea/dizziness: Vestibular evaluation/tx to help manage symptoms.   Improving 11. HTN: Monitor BP tid--goal <140. On Nimotop started on 3/14-->end date 4/4.Marland Kitchen  Added amlodipine 5mg .   Relatively controlled, however hypotension this morning,reduce amlodipine to 2.5mg  12. Leucocytosis: Monitor for signs of infection.   May rise with addition of steroids.  WBC 13.0 on 3/21, labs ordered for tomorrow  Afebrile  13.  Hypokalemia  K+ 4.5 on 3/26  Changed KCL to 43meq BID, labs ordered for tomorrow 14.  Acute blood loss anemia  Hemoglobin 10.6 on 3/21, labs ordered for tomorrow  Continue to monitor 15.  AAA  Seen by vascular yesterday, notes reviewed-plan for outpatient follow-up with repeat ultrasound in 6 months, 16.  Dispo - no 24/7 supervision , SARS Covid testing for placement negative , [place] 2 completed  LOS: 9 days A FACE TO FACE EVALUATION WAS PERFORMED  Charlett Blake 02/09/2020, 7:41 AM

## 2020-02-10 MED ORDER — NIMODIPINE 30 MG PO CAPS: 60.0000 mg | ORAL_CAPSULE | ORAL | 0 refills | Status: DC

## 2020-02-10 MED ORDER — TRAMADOL HCL 50 MG PO TABS: 50.0000 mg | ORAL_TABLET | Freq: Four times a day (QID) | ORAL | 0 refills | Status: DC | PRN

## 2020-02-10 MED ORDER — BUTALBITAL-APAP-CAFFEINE 50-325-40 MG PO TABS: 1.0000 | ORAL_TABLET | ORAL | 0 refills | Status: DC | PRN

## 2020-02-10 MED ORDER — DEXAMETHASONE 4 MG PO TABS
2.0000 mg | ORAL_TABLET | Freq: Two times a day (BID) | ORAL | Status: AC
  Administered 2020-02-10 – 2020-02-11 (×4): 2 mg via ORAL
  Filled 2020-02-10 (×4): qty 1

## 2020-02-10 NOTE — Progress Notes (Signed)
niMODipine (NIMOTOP) capsule 60 mg    Patient refused medication ,made aware and explain purpose states " I told you Im  Not talking anymore medications'

## 2020-02-10 NOTE — Progress Notes (Addendum)
Patient alert,continue to refuse some of her medications po, please refer to [facility], also refuse to wear SCD's states "she doesn't see the need in wearing them, they are in the way when I try and get up to the bathroom"" Explain purpose and rational for this treatment

## 2020-02-10 NOTE — Progress Notes (Signed)
[place] PHYSICAL MEDICINE & REHABILITATION PROGRESS NOTE   Subjective/Complaints: No new issues, was anxious last noc refused some meds, pt felt "everything hit at once"  Initially forgot she was going to SNF but now acknowleges this as "short term" need  ROS: Denies CP, SOB, N/V/D  Objective:   No results found. Recent Labs    02/09/20 0915  WBC 10.6*  HGB 12.8  HCT 40.2  PLT 409*   Recent Labs    02/09/20 0915  NA 133*  K 4.2  CL 104  CO2 18*  GLUCOSE 114*  BUN 18  CREATININE 0.70  CALCIUM 9.8    Intake/Output Summary (Last 24 hours) at 02/10/2020 0756 Last data filed at 02/09/2020 2017 Gross per 24 hour  Intake 480 ml  Output --  Net 480 ml     Physical Exam: Vital Signs Blood pressure (!) 162/85, pulse 67, temperature 98.2 F (36.8 C), temperature source Oral, resp. rate 16, height 5\' 4"  (1.626 m), weight 56.2 kg, SpO2 100 %.  General: No acute distress Mood and affect are appropriate Heart: Regular rate and rhythm no rubs murmurs or extra sounds Lungs: Clear to auscultation, breathing unlabored, no rales or wheezes Abdomen: Positive bowel sounds, soft nontender to palpation, nondistended Extremities: No clubbing, cyanosis, or edema Skin: No evidence of breakdown, no evidence of rash Neurologic: , motor strength is 5/5 in bilateral deltoid, bicep, tricep, grip, hip flexor, knee extensors, ankle dorsiflexor and plantar flexor   Assessment/Plan: 1. Functional deficits secondary to [facility]  which require 3+ hours per day of interdisciplinary therapy in a comprehensive inpatient rehab setting.  Physiatrist is providing close team supervision and 24 hour management of active medical problems listed below.  Physiatrist and rehab team continue to assess barriers to discharge/monitor patient progress toward functional and medical goals  Care Tool:  Bathing  Bathing activity did not occur: Refused Body parts bathed by patient: Right arm, Left arm, Chest,  Abdomen, Front perineal area, Buttocks, Right upper leg, Left upper leg, Right lower leg, Left lower leg, Face         Bathing assist Assist Level: Supervision/Verbal cueing(per most recent staff documentation)     Upper Body Dressing/Undressing Upper body dressing Upper body dressing/undressing activity did not occur (including orthotics): Refused What is the patient wearing?: Bra, Pull over shirt    Upper body assist Assist Level: Set up assist(per most recent staff documentation)    Lower Body Dressing/Undressing Lower body dressing    Lower body dressing activity did not occur: Refused What is the patient wearing?: Underwear/pull up, Pants     Lower body assist Assist for lower body dressing: Supervision/Verbal cueing(per most recent staff documentation) Assistive Device Comment: walker   Toileting Toileting    Toileting assist Assist for toileting: Independent with assistive device(per RN report)     Transfers Chair/bed transfer  Transfers assist     Chair/bed transfer assist level: Supervision/Verbal cueing Chair/bed transfer assistive device: Programmer, multimedia   Ambulation assist   Ambulation activity did not occur: Safety/medical concerns(pt with low K+ on eval, limited to bedside and transfers in the room per MD)  Assist level: Supervision/Verbal cueing Assistive device: Walker-rolling Max distance: > 500 ft   Walk 10 feet activity   Assist  Walk 10 feet activity did not occur: Safety/medical concerns  Assist level: Supervision/Verbal cueing Assistive device: Walker-rolling   Walk 50 feet activity   Assist Walk 50 feet with 2 turns activity did not occur: Safety/medical  concerns  Assist level: Supervision/Verbal cueing Assistive device: Walker-rolling    Walk 150 feet activity   Assist Walk 150 feet activity did not occur: Safety/medical concerns  Assist level: Supervision/Verbal cueing Assistive device: Walker-rolling     Walk 10 feet on uneven surface  activity   Assist Walk 10 feet on uneven surfaces activity did not occur: Safety/medical concerns(pt with low K+ on eval, limited to bedside and transfers in the room per MD)   Assist level: Supervision/Verbal cueing Assistive device: Photographer Will patient use wheelchair at discharge?: No   Wheelchair activity did not occur: Safety/medical concerns(pt with low K+ on eval, limited to bedside and transfers in the room per MD)         Wheelchair 50 feet with 2 turns activity    Assist    Wheelchair 50 feet with 2 turns activity did not occur: Safety/medical concerns       Wheelchair 150 feet activity     Assist  Wheelchair 150 feet activity did not occur: Safety/medical concerns       Blood pressure (!) 162/85, pulse 67, temperature 98.2 F (36.8 C), temperature source Oral, resp. rate 16, height 5\' 4"  (1.626 m), weight 56.2 kg, SpO2 100 %.  Medical Problem List and Plan: 1. Decline in cognition and mobility secondary to Right SAH  Per neurosurgery, likely has meningeal irritation from subarachnoid blood causing her low back and extremity symptomatology.  Restarted dexamethasone for about 1 week 4 mg twice daily over the weekend and then will taper to 2 mg upon discharge to finish the course  Continue CIR until SNF available - medically stable   2. Antithrombotics: -DVT/anticoagulation:Mechanical:Sequential compression devices, below kneeBilateral lower extremities -antiplatelet therapy: N/a 3.Headaches/Pain Management:  As per Neurosurgery back leg and HA pain likely due to subarachnoid blood from aneurysm rupture, restarted steroids for several days No further imaging needed at this time   Increase Topamax   Added K pad for shoulder pain and continue robaxin prnfor shoulder pain. D/C ibuprofen.   Changed hydrocodone to oxycodone and monitor.  Added diclofenac gel and  schedule tylenol.   Avoid side lying with HOB elevated, add sportscreme, trial tramadol for moderate pain, hydrocodone for more severe pain  Relatively controlled with medication on 3/28 4. Mood:LCSW to follow for evaluation and support. -antipsychotic agents: N/A 5. Neuropsych: This patientiscapable of making decisions on herown behalf. 6. Skin/Wound Care:Routine pressure relief measures. 7. Fluids/Electrolytes/Nutrition:Monitor I/O. 8. H/o Meniere's disease:In remission. 9. COPD:Continue Breo.  Resumed claritin. 10. Nausea/dizziness: Vestibular evaluation/tx to help manage symptoms.   Improving 11. HTN: Monitor BP tid--goal <140. On Nimotop started on 3/14-->end date 4/4.6/4  Added amlodipine 5mg .   Relatively controlled, however hypotension this morning,reduce amlodipine to 2.5mg  12. Leucocytosis: Monitor for signs of infection.   May rise with addition of steroids.  WBC 13.0 on 3/21, labs ordered for tomorrow  Afebrile  13.  Hypokalemia-resolved   K+ 4.5 on 3/26, 4.2 on 3/29  Changed KCL to 4/26 BID, 14.  Acute blood loss anemia  Hemoglobin 10.6 on 3/21, labs ordered for tomorrow  Continue to monitor 15.  AAA  Seen by vascular notes reviewed-plan for outpatient follow-up with repeat ultrasound in 6 months, 16.  Dispo - no 24/7 supervision , SARS Covid testing for placement negative , [place] 2 completed  LOS: 10 days A FACE TO FACE EVALUATION WAS PERFORMED  02/10/2020, 7:56 AM

## 2020-02-10 NOTE — Progress Notes (Signed)
Patient is angry this morning stating she :will be glad to get out of this prision', when question concerning this comment patient states she wants to get out of here bur not sure where she is going because she is going  home or her sister's house states " noone is telling us anything'. Reassurance provided and support

## 2020-02-10 NOTE — Plan of Care (Signed)
  Problem: Consults Goal: RH GENERAL PATIENT EDUCATION Description: See Patient Education module for education specifics. Outcome: Progressing   Problem: RH BOWEL ELIMINATION Goal: RH STG MANAGE BOWEL WITH ASSISTANCE Description: STG Manage Bowel with minimal assistance. Outcome: Progressing   Problem: RH BLADDER ELIMINATION Goal: RH STG MANAGE BLADDER WITH ASSISTANCE Description: STG Manage Bladder With minimal Assistance Outcome: Progressing   Problem: RH SKIN INTEGRITY Goal: RH STG SKIN FREE OF INFECTION/BREAKDOWN Outcome: Progressing   

## 2020-02-10 NOTE — Care Management (Signed)
   The overall goal for the admission was met for:   Discharge location:Home with sister  Length of Stay: 11 days with discharge 02/12/20  Discharge activity level: Patient requires supervision secondary to her cognitive deficits  Home/community participation: Limited Participation  Services provided included: MD, RD, PT, OT, SLP, RN, CM, TR, Pharmacy, [place]: Medicare  Follow-up services arranged: [place] PT, OT, SLP  Comments (or additional information): [facility] [phone]  Patient/Family verbalized understanding of follow-up arrangements: Yes  Individual responsible for coordination of the follow-up plan: Sister: Susie: [phone]   Margarito Liner

## 2020-02-11 NOTE — Progress Notes (Signed)
[place] PHYSICAL MEDICINE & REHABILITATION PROGRESS NOTE   Subjective/Complaints: Per RN slept better,  Back pain much improved  ROS: Denies CP, SOB, N/V/D  Objective:   No results found. Recent Labs    02/09/20 0915  WBC 10.6*  HGB 12.8  HCT 40.2  PLT 409*   Recent Labs    02/09/20 0915  NA 133*  K 4.2  CL 104  CO2 18*  GLUCOSE 114*  BUN 18  CREATININE 0.70  CALCIUM 9.8    Intake/Output Summary (Last 24 hours) at 02/11/2020 0831 Last data filed at 02/10/2020 1330 Gross per 24 hour  Intake 160 ml  Output --  Net 160 ml     Physical Exam: Vital Signs Blood pressure (!) 146/77, pulse 62, temperature 98.2 F (36.8 C), temperature source Oral, resp. rate 17, height 5\' 4"  (1.626 m), weight 56.2 kg, SpO2 96 %.   General: No acute distress Mood and affect are appropriate Heart: Regular rate and rhythm no rubs murmurs or extra sounds Lungs: Clear to auscultation, breathing unlabored, no rales or wheezes Abdomen: Positive bowel sounds, soft nontender to palpation, nondistended Extremities: No clubbing, cyanosis, or edema Skin: No evidence of breakdown, no evidence of rash Neurologic:  motor strength is 5/5 in bilateral deltoid, bicep, tricep, grip, hip flexor, knee extensors, ankle dorsiflexor and plantar flexor  Musculoskeletal: Full range of motion in all 4 extremities. No joint swelling    Assessment/Plan: 1. Functional deficits secondary to [facility]   Stable for D/C today F/u Dr [facility]  in 3-4 weeks F/u PM&R 4-6 weeks See D/C summary See D/C instructions Care Tool:  Bathing  Bathing activity did not occur: Refused Body parts bathed by patient: Right arm, Left arm, Chest, Abdomen, Front perineal area, Buttocks, Right upper leg, Left upper leg, Right lower leg, Left lower leg, Face         Bathing assist Assist Level: Supervision/Verbal cueing(per most recent staff documentation)     Upper Body Dressing/Undressing Upper body dressing Upper body  dressing/undressing activity did not occur (including orthotics): Refused What is the patient wearing?: Bra, Pull over shirt    Upper body assist Assist Level: Set up assist(per most recent staff documentation)    Lower Body Dressing/Undressing Lower body dressing    Lower body dressing activity did not occur: Refused What is the patient wearing?: Underwear/pull up, Pants     Lower body assist Assist for lower body dressing: Supervision/Verbal cueing(per most recent staff documentation) Assistive Device Comment: walker   Toileting Toileting    Toileting assist Assist for toileting: Independent with assistive device(per RN report)     Transfers Chair/bed transfer  Transfers assist     Chair/bed transfer assist level: Supervision/Verbal cueing Chair/bed transfer assistive device: Patric Dykes   Ambulation assist   Ambulation activity did not occur: Safety/medical concerns(pt with low K+ on eval, limited to bedside and transfers in the room per MD)  Assist level: Supervision/Verbal cueing Assistive device: Walker-rolling Max distance: > 500 ft   Walk 10 feet activity   Assist  Walk 10 feet activity did not occur: Safety/medical concerns  Assist level: Supervision/Verbal cueing Assistive device: Walker-rolling   Walk 50 feet activity   Assist Walk 50 feet with 2 turns activity did not occur: Safety/medical concerns  Assist level: Supervision/Verbal cueing Assistive device: Walker-rolling    Walk 150 feet activity   Assist Walk 150 feet activity did not occur: Safety/medical concerns  Assist level: Supervision/Verbal cueing Assistive device: Walker-rolling  Walk 10 feet on uneven surface  activity   Assist Walk 10 feet on uneven surfaces activity did not occur: Safety/medical concerns(pt with low K+ on eval, limited to bedside and transfers in the room per MD)   Assist level: Supervision/Verbal cueing Assistive device:  Walker-rolling   Wheelchair     Assist Will patient use wheelchair at discharge?: No   Wheelchair activity did not occur: Safety/medical concerns(pt with low K+ on eval, limited to bedside and transfers in the room per MD)         Wheelchair 50 feet with 2 turns activity    Assist    Wheelchair 50 feet with 2 turns activity did not occur: Safety/medical concerns       Wheelchair 150 feet activity     Assist  Wheelchair 150 feet activity did not occur: Safety/medical concerns       Blood pressure (!) 146/77, pulse 62, temperature 98.2 F (36.8 C), temperature source Oral, resp. rate 17, height 5\' 4"  (1.626 m), weight 56.2 kg, SpO2 96 %.  Medical Problem List and Plan: 1. Decline in cognition and mobility secondary to Right SAH  Per neurosurgery, likely has meningeal irritation from subarachnoid blood causing her low back and extremity symptomatology.  Restarted dexamethasone for about 1 week 4 mg twice daily over the weekend and then will taper to 2 mg upon discharge to finish the course To SNF today   2. Antithrombotics: -DVT/anticoagulation:Mechanical:Sequential compression devices, below kneeBilateral lower extremities -antiplatelet therapy: N/a 3.Headaches/Pain Management:  As per Neurosurgery back leg and HA pain likely due to subarachnoid blood from aneurysm rupture, restarted steroids for several days No further imaging needed at this time  Cont Topamax   Added K pad for shoulder pain and continue robaxin prnfor shoulder pain. D/C ibuprofen.   Changed hydrocodone to oxycodone and monitor.  Added diclofenac gel and schedule tylenol.   Avoid side lying with HOB elevated, add sportscreme, trial tramadol for moderate pain, hydrocodone for more severe pain  Relatively controlled with medication on 3/28 4. Mood:LCSW to follow for evaluation and support. -antipsychotic agents: N/A 5. Neuropsych: This patientiscapable of  making decisions on herown behalf. 6. Skin/Wound Care:Routine pressure relief measures. 7. Fluids/Electrolytes/Nutrition:Monitor I/O. 8. H/o Meniere's disease:In remission. 9. COPD:Continue Breo.  Resumed claritin. 10. Nausea/dizziness: Vestibular evaluation/tx to help manage symptoms.   Improving 11. HTN: Monitor BP tid--goal <140. On Nimotop started on 3/14-->end date 4/4.Marland Kitchen  Added amlodipine 5mg .   Relatively controlled, however hypotension this morning,reduce amlodipine to 2.5mg  12. Leucocytosis: Monitor for signs of infection.   May rise with addition of steroids.  WBC 13.0 on 3/21, labs ordered for tomorrow  Afebrile  13.  Hypokalemia-resolved   K+ 4.5 on 3/26, 4.2 on 3/29  Changed KCL to 27meq BID, 14.  Acute blood loss anemia  Hemoglobin 10.6 on 3/21, labs ordered for tomorrow  Continue to monitor 15.  AAA  Seen by vascular notes reviewed-plan for outpatient follow-up with repeat ultrasound in 6 months, 16.  Dispo - no 24/7 supervision , SARS Covid testing for placement negative , [place] 2 completed  LOS: 11 days A FACE TO FACE EVALUATION WAS PERFORMED  Charlett Blake 02/11/2020, 8:31 AM

## 2020-02-11 NOTE — Care Management (Addendum)
Patient ID: Autumn Le, female   DOB: 16-Nov-1945, 74 y.o.   MRN: [id number] SNF noted request for bed was on hold as no notification from the [facility] authorizing discharge placement. Follow up with [facility] office noted that SNF placement request had been reviewed by the nurse x2 and forwarded to the Medical Director for review. The medical director had denied the request for SNF and the patient or rep could persue a peer to peer or submit additional clinical information however given that the patient was ambulatory and able to walk 500', and yes she had memory and cognitive issues that warranted supervision; there was family available to provide the recommended supervision. The patient did not qualify for a SNF placement and recommendations for home with [facility] would be recommended by the reviewers of the case. Discussed the findings of the review with the sister and the patient. The patient is considered medically ready for discharge and HH services have been arranged. The sister requested time to get ready for the patient to come to her home and noted she would be here to pick up the patient on 02/12/20 in the morning.  Spoke with another sister in [place] who  Voiced concern over the discharge to her sister's home given that the sister [name] has a husband with dementia and this would put pressure on her to supervise now two people in her home with cognitive issues. Discussed option for the patient to go to her home in [place]. Reviewed follow up appointments with local physicians that would be recommended at discharge and the option of setting up [facility] services in [place] if that were the chosen discharge destination. She is to speak with the patient, sister [name] and her Niece [name] and get back with [place] with their decision. [facility] was also alerted of the change in discharge plans following the SNF placement denial.

## 2020-02-12 MED ORDER — BUTALBITAL-APAP-CAFFEINE 50-325-40 MG PO TABS: 1.0000 | ORAL_TABLET | ORAL | 0 refills | Status: DC | PRN

## 2020-02-12 MED ORDER — TOPIRAMATE 25 MG PO TABS: 25.0000 mg | ORAL_TABLET | Freq: Every day | ORAL | 0 refills | Status: DC

## 2020-02-12 MED ORDER — AMLODIPINE BESYLATE 2.5 MG PO TABS: 2.5000 mg | ORAL_TABLET | Freq: Every day | ORAL | 0 refills | Status: DC

## 2020-02-12 MED ORDER — TRAMADOL HCL 50 MG PO TABS: 50.0000 mg | ORAL_TABLET | Freq: Four times a day (QID) | ORAL | 0 refills | Status: DC | PRN

## 2020-02-12 MED ORDER — DICLOFENAC SODIUM 1 % EX GEL: 2.0000 g | Freq: Four times a day (QID) | CUTANEOUS | 0 refills | Status: DC

## 2020-02-12 MED ORDER — POLYETHYLENE GLYCOL 3350 17 G PO PACK: 17.0000 g | PACK | Freq: Two times a day (BID) | ORAL | 0 refills | Status: DC

## 2020-02-12 MED ORDER — PANTOPRAZOLE SODIUM 40 MG PO TBEC: 40.0000 mg | DELAYED_RELEASE_TABLET | Freq: Every day | ORAL | 0 refills | Status: DC

## 2020-02-12 NOTE — Discharge Instructions (Signed)
Inpatient Rehab Discharge Instructions  Autumn Le Discharge date and time:    Activities/Precautions/ Functional Status: Activity: no lifting, driving, or strenuous exercise till cleared by MD Diet: regular diet Wound Care: keep wound clean and dry    Functional status:  ___ No restrictions     ___ Walk up steps independently ___ 24/7 supervision/assistance   ___ Walk up steps with assistance ___ Intermittent supervision/assistance  ___ Bathe/dress independently ___ Walk with walker     ___ Bathe/dress with assistance ___ Walk Independently    ___ Shower independently ___ Walk with assistance    ___ Shower with assistance ___ No alcohol     ___ Return to work/school ________  COMMUNITY REFERRALS UPON DISCHARGE:  Home Health: PT, OT, ST  Agency:[facility] Phone:[phone]  Medical Equipment/Items Ordered:RW  Agency/Supplier:[facility]  Special Instructions:    My questions have been answered and I understand these instructions. I will adhere to these goals and the provided educational materials after my discharge from the hospital.  Patient/Caregiver Signature _______________________________ Date __________  Clinician Signature _______________________________________ Date __________  Please bring this form and your medication list with you to all your follow-up doctor's appointments.

## 2020-02-12 NOTE — Progress Notes (Signed)
Patient continue to refuse most of her medications this shift and all dosages of her Nimotop capsule stating it to much medicine and those pills are to big to swallow. Patient was encouraged several time during shift, please refer to [facility] Dan A. Also informed this morning

## 2020-02-12 NOTE — Progress Notes (Signed)
[place] PHYSICAL MEDICINE & REHABILITATION PROGRESS NOTE   Subjective/Complaints: Discussed change of d/c destination Discussed need to complete Nimotop course "pill too big"  Discussed liquid alternative Which pt states she may not take either if it is too "nasty" DIscussed no smoking and no driving  ROS: Denies CP, SOB, N/V/D  Objective:   No results found. Recent Labs    02/09/20 0915  WBC 10.6*  HGB 12.8  HCT 40.2  PLT 409*   Recent Labs    02/09/20 0915  NA 133*  K 4.2  CL 104  CO2 18*  GLUCOSE 114*  BUN 18  CREATININE 0.70  CALCIUM 9.8    Intake/Output Summary (Last 24 hours) at 02/12/2020 0739 Last data filed at 02/11/2020 2200 Gross per 24 hour  Intake 560 ml  Output --  Net 560 ml     Physical Exam: Vital Signs Blood pressure (!) 154/73, pulse 62, temperature 98 F (36.7 C), temperature source Oral, resp. rate 18, height 5\' 4"  (1.626 m), weight 56.2 kg, SpO2 96 %.    General: No acute distress Mood and affect are appropriate Heart: Regular rate and rhythm no rubs murmurs or extra sounds Lungs: Clear to auscultation, breathing unlabored, no rales or wheezes Abdomen: Positive bowel sounds, soft nontender to palpation, nondistended Extremities: No clubbing, cyanosis, or edema Skin: No evidence of breakdown, no evidence of rash Neurologic: Cranial nerves II through XII intact, motor strength is 5/5 in bilateral deltoid, bicep, tricep, grip, hip flexor, knee extensors, ankle dorsiflexor and plantar flexor   Musculoskeletal: Full range of motion in all 4 extremities. No joint swelling    Assessment/Plan: 1. Functional deficits secondary to [facility]   Stable for D/C today F/u Dr Ralene Ok  in 3-4 weeks F/u PM&R 4-6 weeks See D/C summary See D/C instructions Care Tool:  Bathing  Bathing activity did not occur: Refused Body parts bathed by patient: Right arm, Left arm, Chest, Abdomen, Front perineal area, Buttocks, Right upper leg, Left upper leg,  Right lower leg, Left lower leg, Face         Bathing assist Assist Level: Supervision/Verbal cueing(per most recent staff documentation)     Upper Body Dressing/Undressing Upper body dressing Upper body dressing/undressing activity did not occur (including orthotics): Refused What is the patient wearing?: Bra, Pull over shirt    Upper body assist Assist Level: Set up assist(per most recent staff documentation)    Lower Body Dressing/Undressing Lower body dressing    Lower body dressing activity did not occur: Refused What is the patient wearing?: Underwear/pull up, Pants     Lower body assist Assist for lower body dressing: Supervision/Verbal cueing(per most recent staff documentation) Assistive Device Comment: walker   Toileting Toileting    Toileting assist Assist for toileting: Independent with assistive device(per RN report)     Transfers Chair/bed transfer  Transfers assist     Chair/bed transfer assist level: Supervision/Verbal cueing Chair/bed transfer assistive device: Programmer, multimedia   Ambulation assist   Ambulation activity did not occur: Safety/medical concerns(pt with low K+ on eval, limited to bedside and transfers in the room per MD)  Assist level: Supervision/Verbal cueing Assistive device: Walker-rolling Max distance: > 500 ft   Walk 10 feet activity   Assist  Walk 10 feet activity did not occur: Safety/medical concerns  Assist level: Supervision/Verbal cueing Assistive device: Walker-rolling   Walk 50 feet activity   Assist Walk 50 feet with 2 turns activity did not occur: Safety/medical concerns  Assist level: Supervision/Verbal cueing Assistive device: Walker-rolling    Walk 150 feet activity   Assist Walk 150 feet activity did not occur: Safety/medical concerns  Assist level: Supervision/Verbal cueing Assistive device: Walker-rolling    Walk 10 feet on uneven surface  activity   Assist Walk 10 feet  on uneven surfaces activity did not occur: Safety/medical concerns(pt with low K+ on eval, limited to bedside and transfers in the room per MD)   Assist level: Supervision/Verbal cueing Assistive device: Photographer Will patient use wheelchair at discharge?: No   Wheelchair activity did not occur: Safety/medical concerns(pt with low K+ on eval, limited to bedside and transfers in the room per MD)         Wheelchair 50 feet with 2 turns activity    Assist    Wheelchair 50 feet with 2 turns activity did not occur: Safety/medical concerns       Wheelchair 150 feet activity     Assist  Wheelchair 150 feet activity did not occur: Safety/medical concerns       Blood pressure (!) 154/73, pulse 62, temperature 98 F (36.7 C), temperature source Oral, resp. rate 18, height 5\' 4"  (1.626 m), weight 56.2 kg, SpO2 96 %.  Medical Problem List and Plan: 1. Decline in cognition and mobility secondary to Right SAH  Per neurosurgery, likely has meningeal irritation from subarachnoid blood causing her low back and extremity symptomatology.   Now going to sisters house  2. Antithrombotics: -DVT/anticoagulation:Mechanical:Sequential compression devices, below kneeBilateral lower extremities -antiplatelet therapy: N/a 3.Headaches/Pain Management:  As per Neurosurgery back leg and HA pain likely due to subarachnoid blood from aneurysm rupture, restarted steroids for several days No further imaging needed at this time  Cont Topamax   Added K pad for shoulder pain and continue robaxin prnfor shoulder pain. D/C ibuprofen.   Changed hydrocodone to oxycodone and monitor.  Added diclofenac gel and schedule tylenol.   Avoid side lying with HOB elevated, add sportscreme, trial tramadol for moderate pain, hydrocodone for more severe pain  Relatively controlled with medication on 3/28 4. Mood:LCSW to follow for evaluation and  support. -antipsychotic agents: N/A 5. Neuropsych: This patientiscapable of making decisions on herown behalf. 6. Skin/Wound Care:Routine pressure relief measures. 7. Fluids/Electrolytes/Nutrition:Monitor I/O. 8. H/o Meniere's disease:In remission. 9. COPD:Continue Breo.  Resumed claritin. 10. Nausea/dizziness: Vestibular evaluation/tx to help manage symptoms.   Improving 11. HTN: Monitor BP tid--goal <140. On Nimotop started on 3/14-->end date 4/4.6/4  Added amlodipine 5mg .   Relatively controlled, however hypotension this morning,reduce amlodipine to 2.5mg  12. Leucocytosis: Monitor for signs of infection.   May rise with addition of steroids.  WBC 13.0 on 3/21, labs ordered for tomorrow  Afebrile  13.  Hypokalemia-resolved   K+ 4.5 on 3/26, 4.2 on 3/29  Changed KCL to 4/26 BID, 14.  Acute blood loss anemia  Hemoglobin 10.6 on 3/21, labs ordered for tomorrow  Continue to monitor 15.  AAA  Seen by vascular notes reviewed-plan for outpatient follow-up with repeat ultrasound in 6 months, 16.  Dispo - to sister's house in [place] LOS: 12 days A FACE TO FACE EVALUATION WAS PERFORMED  02/12/2020, 7:39 AM

## 2020-02-12 NOTE — Plan of Care (Signed)
  Problem: RH BOWEL ELIMINATION Goal: RH STG MANAGE BOWEL WITH ASSISTANCE Description: STG Manage Bowel with minimal assistance. Outcome: Completed/Met   Problem: Consults Goal: RH GENERAL PATIENT EDUCATION Description: See Patient Education module for education specifics. Outcome: Completed/Met   Problem: RH BLADDER ELIMINATION Goal: RH STG MANAGE BLADDER WITH ASSISTANCE Description: STG Manage Bladder With minimal Assistance Outcome: Completed/Met   Problem: RH SKIN INTEGRITY Goal: RH STG SKIN FREE OF INFECTION/BREAKDOWN Outcome: Completed/Met Goal: RH STG MAINTAIN SKIN INTEGRITY WITH ASSISTANCE Description: STG Maintain Skin Integrity With mod I Assistance. Outcome: Completed/Met

## 2020-02-12 NOTE — Progress Notes (Signed)
Provider discussed discharge instructions with patient and her family members. Home equipment was delivered prior to discharge. Home medication given to her sister, [name]. Patient discharging home with her sister.

## 2020-02-17 ENCOUNTER — Telehealth: Payer: Self-pay | Admitting: Registered Nurse

## 2020-02-17 ENCOUNTER — Ambulatory Visit
Admission: RE | Admit: 2020-02-17 | Discharge: 2020-02-17 | Disposition: A | Discharge status: Home / Self Care | Payer: Medicare HMO | Source: Ambulatory Visit | Attending: Family Medicine | Admitting: Family Medicine

## 2020-02-17 DIAGNOSIS — M8580 Other specified disorders of bone density and structure, unspecified site: Secondary | ICD-10-CM | POA: Diagnosis not present

## 2020-02-17 DIAGNOSIS — I714 Abdominal aortic aneurysm, without rupture: Secondary | ICD-10-CM | POA: Diagnosis not present

## 2020-02-17 DIAGNOSIS — M47812 Spondylosis without myelopathy or radiculopathy, cervical region: Secondary | ICD-10-CM | POA: Diagnosis not present

## 2020-02-17 DIAGNOSIS — Z1231 Encounter for screening mammogram for malignant neoplasm of breast: Secondary | ICD-10-CM | POA: Diagnosis not present

## 2020-02-17 DIAGNOSIS — M47816 Spondylosis without myelopathy or radiculopathy, lumbar region: Secondary | ICD-10-CM | POA: Diagnosis not present

## 2020-02-17 DIAGNOSIS — I700 Atherosclerosis of aorta: Secondary | ICD-10-CM | POA: Diagnosis not present

## 2020-02-17 DIAGNOSIS — H8103 Meniere's disease, bilateral: Secondary | ICD-10-CM | POA: Diagnosis not present

## 2020-02-17 DIAGNOSIS — I10 Essential (primary) hypertension: Secondary | ICD-10-CM | POA: Diagnosis not present

## 2020-02-17 DIAGNOSIS — S066X0D Traumatic subarachnoid hemorrhage without loss of consciousness, subsequent encounter: Secondary | ICD-10-CM | POA: Diagnosis not present

## 2020-02-17 DIAGNOSIS — J439 Emphysema, unspecified: Secondary | ICD-10-CM | POA: Diagnosis not present

## 2020-02-17 NOTE — Telephone Encounter (Signed)
Transitional Care call Transitional Questions Answered by Autumn Le: Sister  Patient name: Autumn Le  DOB: Sep 01, 1946 1. Are you/is patient experiencing any problems since coming home? Yes, she states she didn't receive the potassium prescription, she called her PCP today, they ordered the potassium today she reports.  a. Are there any questions regarding any aspect of care? See above.  2. Are there any questions regarding medications administration/dosing? See above, she will be picking up her potassium today.  a. Are meds being taken as prescribed? All except potassium, see the above. She will give potassium tonight.  b. "Patient should review meds with caller to confirm" Medication List Reviewed.  3. Have there been any falls? No 4. Has Home Health been to the house and/or have they contacted you? [facility] a. If not, have you tried to contact them? NA b. Can we help you contact them? NA 5. Are bowels and bladder emptying properly? Yes a. Are there any unexpected incontinence issues? No b. If applicable, is patient following bowel/bladder programs? NA 6. Any fevers, problems with breathing, unexpected pain? No 7. Are there any skin problems or new areas of breakdown? No 8. Has the patient/family member arranged specialty MD follow up (ie cardiology/neurology/renal/surgical/etc.)?  Autumn Le is awaiting a return call from Dr. Conchita Paris office she reports. Placed a call to Dr Conchita Paris office spoke with Reola Calkins, they will be calling Autumn Le tomorrow. Placed a call to Autumn Le regarding the above, she verbalizes understanding.  a. Can we help arrange? No  9. Does the patient need any other services or support that we can help arrange? No 10. Are caregivers following through as expected in assisting the patient? Yes 11. Has the patient quit smoking, drinking alcohol, or using drugs as recommended? (                        )  Appointment date/time 02/24/2020  arrival time 1:45 for  2:00 appointment with Jones Bales ANP-C. At  [address] [facility] [address]

## 2020-02-18 ENCOUNTER — Telehealth: Payer: Self-pay

## 2020-02-18 NOTE — Telephone Encounter (Signed)
Tiffany, PT from [facility] called requesting verbal order for HHPT 1wk2, 2wk4, 2wk3. Orders approved and given.

## 2020-02-19 DIAGNOSIS — I671 Cerebral aneurysm, nonruptured: Secondary | ICD-10-CM | POA: Diagnosis not present

## 2020-02-19 DIAGNOSIS — I609 Nontraumatic subarachnoid hemorrhage, unspecified: Secondary | ICD-10-CM | POA: Diagnosis not present

## 2020-02-20 DIAGNOSIS — M47812 Spondylosis without myelopathy or radiculopathy, cervical region: Secondary | ICD-10-CM | POA: Diagnosis not present

## 2020-02-20 DIAGNOSIS — I714 Abdominal aortic aneurysm, without rupture: Secondary | ICD-10-CM | POA: Diagnosis not present

## 2020-02-20 DIAGNOSIS — S066X0D Traumatic subarachnoid hemorrhage without loss of consciousness, subsequent encounter: Secondary | ICD-10-CM | POA: Diagnosis not present

## 2020-02-20 DIAGNOSIS — M8580 Other specified disorders of bone density and structure, unspecified site: Secondary | ICD-10-CM | POA: Diagnosis not present

## 2020-02-20 DIAGNOSIS — I10 Essential (primary) hypertension: Secondary | ICD-10-CM | POA: Diagnosis not present

## 2020-02-20 DIAGNOSIS — H8103 Meniere's disease, bilateral: Secondary | ICD-10-CM | POA: Diagnosis not present

## 2020-02-20 DIAGNOSIS — M47816 Spondylosis without myelopathy or radiculopathy, lumbar region: Secondary | ICD-10-CM | POA: Diagnosis not present

## 2020-02-20 DIAGNOSIS — I700 Atherosclerosis of aorta: Secondary | ICD-10-CM | POA: Diagnosis not present

## 2020-02-20 DIAGNOSIS — J439 Emphysema, unspecified: Secondary | ICD-10-CM | POA: Diagnosis not present

## 2020-02-24 ENCOUNTER — Encounter: Payer: Medicare HMO | Attending: Registered Nurse | Admitting: Registered Nurse

## 2020-02-24 ENCOUNTER — Encounter: Payer: Self-pay | Admitting: Registered Nurse

## 2020-02-24 ENCOUNTER — Other Ambulatory Visit: Payer: Self-pay

## 2020-02-24 VITALS — BP 127/82 | HR 74 | Temp 97.7°F | Ht 64.0 in | Wt 111.8 lb

## 2020-02-24 DIAGNOSIS — I10 Essential (primary) hypertension: Secondary | ICD-10-CM | POA: Diagnosis not present

## 2020-02-24 DIAGNOSIS — I609 Nontraumatic subarachnoid hemorrhage, unspecified: Secondary | ICD-10-CM | POA: Diagnosis not present

## 2020-02-24 DIAGNOSIS — I607 Nontraumatic subarachnoid hemorrhage from unspecified intracranial artery: Secondary | ICD-10-CM | POA: Diagnosis not present

## 2020-02-24 NOTE — Progress Notes (Signed)
Subjective:    Patient ID: Autumn Le, female    DOB: 1946/07/14, 74 y.o.   MRN: [id number]  HPI: Autumn Le is a 74 y.o. female who is here for transitional care visit in follow up of her ruptured cerebral aneurysm, SAH and essential hypertension.   Autumn Le went to [facility] on 01/23/2020,s/p fall.  CT Head WO Contrast: CT Angio/ CT Cervical Spine IMPRESSION: 1. Extensive subarachnoid hemorrhage about the right cerebral hemisphere and basal cisterns. No significant mass effect or midline shift. This is of uncertain etiology, traumatic versus aneurysmal. No overlying skull fracture. Please see forthcoming CT angiogram. 2. Parenchymal hypodensity of the right temporal pole, of uncertain etiology, traumatic versus infarction. 3. No fracture or static subluxation of the cervical spine.  Autumn Le was transferred to [facility] for workup, she underwent cerebral angio via coil embolization by Dr. Kathyrn Sheriff.  [name] was admitted to inpatient rehabilitation on 03/20/2021and discharged to her sister's home. She's receiving outpatient therapy with [facility].  She denies any pain. She rated her pain 0.  Also reports her appetite fair.   Pain Inventory Average Pain 0 Pain Right Now 0 My pain is sharp and stabbing  In the last 24 hours, has pain interfered with the following? General activity 3 Relation with others 3 Enjoyment of life 3 What TIME of day is your pain at its worst? daytime Sleep (in general) Good  Pain is worse with: unsure Pain improves with: rest and medication Relief from Meds: na  Mobility use a walker how many minutes can you walk? 3+ ability to climb steps?  yes do you drive?  no  Function retired  Neuro/Psych dizziness  Prior Studies Any changes since last visit?  no  Physicians involved in your care Any changes since last visit?  no   Family History  Problem Relation  Age of Onset  . Breast cancer Maternal Aunt    Social History   Socioeconomic History  . Marital status: Single    Spouse name: Not on file  . Number of children: Not on file  . Years of education: Not on file  . Highest education level: Not on file  Occupational History  . Not on file  Tobacco Use  . Smoking status: Current Every Day Smoker  . Smokeless tobacco: Never Used  Substance and Sexual Activity  . Alcohol use: Not Currently  . Drug use: Not Currently  . Sexual activity: Not on file  Other Topics Concern  . Not on file  Social History Narrative  . Not on file   Social Determinants of Health   Financial Resource Strain:   . Difficulty of Paying Living Expenses:   Food Insecurity:   . Worried About Charity fundraiser in the Last Year:   . Arboriculturist in the Last Year:   Transportation Needs:   . Film/video editor (Medical):   Marland Kitchen Lack of Transportation (Non-Medical):   Physical Activity:   . Days of Exercise per Week:   . Minutes of Exercise per Session:   Stress:   . Feeling of Stress :   Social Connections:   . Frequency of Communication with Friends and Family:   . Frequency of Social Gatherings with Friends and Family:   . Attends Religious Services:   . Active Member of Clubs or Organizations:   . Attends Archivist Meetings:   Marland Kitchen Marital Status:  Past Surgical History:  Procedure Laterality Date  . IR ANGIO INTRA EXTRACRAN SEL INTERNAL CAROTID BILAT MOD SED  01/23/2020  . IR ANGIO VERTEBRAL SEL VERTEBRAL UNI L MOD SED  01/23/2020  . IR ANGIOGRAM FOLLOW UP STUDY  01/23/2020  . IR ANGIOGRAM FOLLOW UP STUDY  01/23/2020  . IR ANGIOGRAM FOLLOW UP STUDY  01/23/2020  . IR ANGIOGRAM FOLLOW UP STUDY  01/23/2020  . IR TRANSCATH/EMBOLIZ  01/23/2020  . RADIOLOGY WITH ANESTHESIA N/A 01/23/2020   Procedure: RADIOLOGY WITH ANESTHESIA;  Surgeon: Autumn Renshaw, MD;  Location: [facility] OR;  Service: Radiology;  Laterality: N/A;  . TONSILLECTOMY      Past Medical History:  Diagnosis Date  . COPD (chronic obstructive pulmonary disease) (HCC)   . Degenerative joint disease of cervical spine   . Meniere's disease in remission, bilateral   . Osteopenia   . Vertigo    BP 127/82   Pulse 74   Temp 97.7 F (36.5 C)   Ht 5\' 4"  (1.626 m)   Wt 111 lb 12.8 oz (50.7 kg)   SpO2 97%   BMI 19.19 kg/m   Opioid Risk Score:   Fall Risk Score:  `1  Depression screen PHQ 2/9  Depression screen PHQ 2/9 02/24/2020  Decreased Interest 0  Down, Depressed, Hopeless 0  PHQ - 2 Score 0  Altered sleeping 0  Tired, decreased energy 1  Change in appetite 0  Feeling bad or failure about yourself  0  Trouble concentrating 0  Moving slowly or fidgety/restless 0  Suicidal thoughts 0  PHQ-9 Score 1  Difficult doing work/chores Somewhat difficult    Review of Systems  Constitutional: Negative.   HENT: Negative.   Eyes: Negative.   Respiratory: Negative.   Cardiovascular: Negative.   Gastrointestinal: Negative.   Endocrine: Negative.   Genitourinary: Negative.   Musculoskeletal: Negative.   Skin: Negative.   Allergic/Immunologic: Negative.   Neurological: Positive for dizziness.  Hematological: Negative.   Psychiatric/Behavioral: Negative.   All other systems reviewed and are negative.      Objective:   Physical Exam Vitals and nursing note reviewed.  Constitutional:      Appearance: Normal appearance.  Cardiovascular:     Rate and Rhythm: Normal rate and regular rhythm.     Pulses: Normal pulses.     Heart sounds: Normal heart sounds.  Pulmonary:     Effort: Pulmonary effort is normal.     Breath sounds: Normal breath sounds.  Musculoskeletal:     Cervical back: Normal range of motion and neck supple.     Comments: Normal Muscle Bulk and Muscle Testing Reveals:  Upper Extremities: Full ROM and Muscle Strength 5/5 Lower Extremities: Full ROM and Muscle Strength 5/5 Arises from table slowly using walker for support Narrow  Based Gait   Skin:    General: Skin is warm and dry.  Neurological:     Mental Status: She is alert and oriented to person, place, and time.  Psychiatric:        Mood and Affect: Mood normal.        Behavior: Behavior normal.           Assessment & Plan:  1. Ruptured cerebral aneurysm/ SAH: Continue Outpatient Therapy with [facility]. Neurosurgery Following. Continue to Monitor.  2. Essential hypertension:  Continue current medication. PCP Following . Continue to Monitor.

## 2020-02-25 ENCOUNTER — Encounter: Payer: Medicare HMO | Admitting: Registered Nurse

## 2020-02-25 DIAGNOSIS — Z8679 Personal history of other diseases of the circulatory system: Secondary | ICD-10-CM | POA: Insufficient documentation

## 2020-02-25 DIAGNOSIS — Z09 Encounter for follow-up examination after completed treatment for conditions other than malignant neoplasm: Secondary | ICD-10-CM | POA: Diagnosis not present

## 2020-02-25 DIAGNOSIS — R519 Headache, unspecified: Secondary | ICD-10-CM | POA: Diagnosis not present

## 2020-02-25 DIAGNOSIS — J439 Emphysema, unspecified: Secondary | ICD-10-CM | POA: Diagnosis not present

## 2020-02-25 DIAGNOSIS — E876 Hypokalemia: Secondary | ICD-10-CM | POA: Diagnosis not present

## 2020-02-25 DIAGNOSIS — Z79899 Other long term (current) drug therapy: Secondary | ICD-10-CM | POA: Diagnosis not present

## 2020-02-26 DIAGNOSIS — J439 Emphysema, unspecified: Secondary | ICD-10-CM | POA: Diagnosis not present

## 2020-02-26 DIAGNOSIS — H8103 Meniere's disease, bilateral: Secondary | ICD-10-CM | POA: Diagnosis not present

## 2020-02-26 DIAGNOSIS — I10 Essential (primary) hypertension: Secondary | ICD-10-CM | POA: Diagnosis not present

## 2020-02-26 DIAGNOSIS — M47812 Spondylosis without myelopathy or radiculopathy, cervical region: Secondary | ICD-10-CM | POA: Diagnosis not present

## 2020-02-26 DIAGNOSIS — I700 Atherosclerosis of aorta: Secondary | ICD-10-CM | POA: Diagnosis not present

## 2020-02-26 DIAGNOSIS — S066X0D Traumatic subarachnoid hemorrhage without loss of consciousness, subsequent encounter: Secondary | ICD-10-CM | POA: Diagnosis not present

## 2020-02-26 DIAGNOSIS — I714 Abdominal aortic aneurysm, without rupture: Secondary | ICD-10-CM | POA: Diagnosis not present

## 2020-02-26 DIAGNOSIS — M8580 Other specified disorders of bone density and structure, unspecified site: Secondary | ICD-10-CM | POA: Diagnosis not present

## 2020-02-26 DIAGNOSIS — M47816 Spondylosis without myelopathy or radiculopathy, lumbar region: Secondary | ICD-10-CM | POA: Diagnosis not present

## 2020-02-27 DIAGNOSIS — S066X0D Traumatic subarachnoid hemorrhage without loss of consciousness, subsequent encounter: Secondary | ICD-10-CM | POA: Diagnosis not present

## 2020-02-27 DIAGNOSIS — J439 Emphysema, unspecified: Secondary | ICD-10-CM | POA: Diagnosis not present

## 2020-02-27 DIAGNOSIS — M47816 Spondylosis without myelopathy or radiculopathy, lumbar region: Secondary | ICD-10-CM | POA: Diagnosis not present

## 2020-02-27 DIAGNOSIS — I714 Abdominal aortic aneurysm, without rupture: Secondary | ICD-10-CM | POA: Diagnosis not present

## 2020-02-27 DIAGNOSIS — M8580 Other specified disorders of bone density and structure, unspecified site: Secondary | ICD-10-CM | POA: Diagnosis not present

## 2020-02-27 DIAGNOSIS — I700 Atherosclerosis of aorta: Secondary | ICD-10-CM | POA: Diagnosis not present

## 2020-02-27 DIAGNOSIS — H8103 Meniere's disease, bilateral: Secondary | ICD-10-CM | POA: Diagnosis not present

## 2020-02-27 DIAGNOSIS — M47812 Spondylosis without myelopathy or radiculopathy, cervical region: Secondary | ICD-10-CM | POA: Diagnosis not present

## 2020-02-27 DIAGNOSIS — I10 Essential (primary) hypertension: Secondary | ICD-10-CM | POA: Diagnosis not present

## 2020-03-02 DIAGNOSIS — I714 Abdominal aortic aneurysm, without rupture: Secondary | ICD-10-CM | POA: Diagnosis not present

## 2020-03-02 DIAGNOSIS — I700 Atherosclerosis of aorta: Secondary | ICD-10-CM | POA: Diagnosis not present

## 2020-03-02 DIAGNOSIS — M47812 Spondylosis without myelopathy or radiculopathy, cervical region: Secondary | ICD-10-CM | POA: Diagnosis not present

## 2020-03-02 DIAGNOSIS — M47816 Spondylosis without myelopathy or radiculopathy, lumbar region: Secondary | ICD-10-CM | POA: Diagnosis not present

## 2020-03-02 DIAGNOSIS — H8103 Meniere's disease, bilateral: Secondary | ICD-10-CM | POA: Diagnosis not present

## 2020-03-02 DIAGNOSIS — M8580 Other specified disorders of bone density and structure, unspecified site: Secondary | ICD-10-CM | POA: Diagnosis not present

## 2020-03-02 DIAGNOSIS — S066X0D Traumatic subarachnoid hemorrhage without loss of consciousness, subsequent encounter: Secondary | ICD-10-CM | POA: Diagnosis not present

## 2020-03-02 DIAGNOSIS — I10 Essential (primary) hypertension: Secondary | ICD-10-CM | POA: Diagnosis not present

## 2020-03-02 DIAGNOSIS — J439 Emphysema, unspecified: Secondary | ICD-10-CM | POA: Diagnosis not present

## 2020-03-04 ENCOUNTER — Telehealth: Payer: Self-pay | Admitting: *Deleted

## 2020-03-04 DIAGNOSIS — M8580 Other specified disorders of bone density and structure, unspecified site: Secondary | ICD-10-CM | POA: Diagnosis not present

## 2020-03-04 DIAGNOSIS — M47816 Spondylosis without myelopathy or radiculopathy, lumbar region: Secondary | ICD-10-CM | POA: Diagnosis not present

## 2020-03-04 DIAGNOSIS — I714 Abdominal aortic aneurysm, without rupture: Secondary | ICD-10-CM | POA: Diagnosis not present

## 2020-03-04 DIAGNOSIS — M47812 Spondylosis without myelopathy or radiculopathy, cervical region: Secondary | ICD-10-CM | POA: Diagnosis not present

## 2020-03-04 DIAGNOSIS — I10 Essential (primary) hypertension: Secondary | ICD-10-CM | POA: Diagnosis not present

## 2020-03-04 DIAGNOSIS — J439 Emphysema, unspecified: Secondary | ICD-10-CM | POA: Diagnosis not present

## 2020-03-04 DIAGNOSIS — H8103 Meniere's disease, bilateral: Secondary | ICD-10-CM | POA: Diagnosis not present

## 2020-03-04 DIAGNOSIS — S066X0D Traumatic subarachnoid hemorrhage without loss of consciousness, subsequent encounter: Secondary | ICD-10-CM | POA: Diagnosis not present

## 2020-03-04 DIAGNOSIS — I700 Atherosclerosis of aorta: Secondary | ICD-10-CM | POA: Diagnosis not present

## 2020-03-04 NOTE — Telephone Encounter (Signed)
[name], ST, [facility] left a message asking for verbal orders for home health speech therapy 1wk6.  Medical record reviewed. Social work note reviewed.  Verbal orders given per office protocol.

## 2020-03-08 DIAGNOSIS — M47816 Spondylosis without myelopathy or radiculopathy, lumbar region: Secondary | ICD-10-CM | POA: Diagnosis not present

## 2020-03-08 DIAGNOSIS — M8580 Other specified disorders of bone density and structure, unspecified site: Secondary | ICD-10-CM | POA: Diagnosis not present

## 2020-03-08 DIAGNOSIS — I714 Abdominal aortic aneurysm, without rupture: Secondary | ICD-10-CM | POA: Diagnosis not present

## 2020-03-08 DIAGNOSIS — J439 Emphysema, unspecified: Secondary | ICD-10-CM | POA: Diagnosis not present

## 2020-03-08 DIAGNOSIS — S066X0D Traumatic subarachnoid hemorrhage without loss of consciousness, subsequent encounter: Secondary | ICD-10-CM | POA: Diagnosis not present

## 2020-03-08 DIAGNOSIS — I700 Atherosclerosis of aorta: Secondary | ICD-10-CM | POA: Diagnosis not present

## 2020-03-08 DIAGNOSIS — I10 Essential (primary) hypertension: Secondary | ICD-10-CM | POA: Diagnosis not present

## 2020-03-08 DIAGNOSIS — H8103 Meniere's disease, bilateral: Secondary | ICD-10-CM | POA: Diagnosis not present

## 2020-03-08 DIAGNOSIS — M47812 Spondylosis without myelopathy or radiculopathy, cervical region: Secondary | ICD-10-CM | POA: Diagnosis not present

## 2020-03-09 DIAGNOSIS — J439 Emphysema, unspecified: Secondary | ICD-10-CM | POA: Diagnosis not present

## 2020-03-09 DIAGNOSIS — I700 Atherosclerosis of aorta: Secondary | ICD-10-CM | POA: Diagnosis not present

## 2020-03-09 DIAGNOSIS — M47812 Spondylosis without myelopathy or radiculopathy, cervical region: Secondary | ICD-10-CM | POA: Diagnosis not present

## 2020-03-09 DIAGNOSIS — M47816 Spondylosis without myelopathy or radiculopathy, lumbar region: Secondary | ICD-10-CM | POA: Diagnosis not present

## 2020-03-09 DIAGNOSIS — I10 Essential (primary) hypertension: Secondary | ICD-10-CM | POA: Diagnosis not present

## 2020-03-09 DIAGNOSIS — M8580 Other specified disorders of bone density and structure, unspecified site: Secondary | ICD-10-CM | POA: Diagnosis not present

## 2020-03-09 DIAGNOSIS — I714 Abdominal aortic aneurysm, without rupture: Secondary | ICD-10-CM | POA: Diagnosis not present

## 2020-03-09 DIAGNOSIS — H8103 Meniere's disease, bilateral: Secondary | ICD-10-CM | POA: Diagnosis not present

## 2020-03-09 DIAGNOSIS — S066X0D Traumatic subarachnoid hemorrhage without loss of consciousness, subsequent encounter: Secondary | ICD-10-CM | POA: Diagnosis not present

## 2020-03-10 DIAGNOSIS — H8103 Meniere's disease, bilateral: Secondary | ICD-10-CM | POA: Diagnosis not present

## 2020-03-10 DIAGNOSIS — M47816 Spondylosis without myelopathy or radiculopathy, lumbar region: Secondary | ICD-10-CM | POA: Diagnosis not present

## 2020-03-10 DIAGNOSIS — I10 Essential (primary) hypertension: Secondary | ICD-10-CM | POA: Diagnosis not present

## 2020-03-10 DIAGNOSIS — M8580 Other specified disorders of bone density and structure, unspecified site: Secondary | ICD-10-CM | POA: Diagnosis not present

## 2020-03-10 DIAGNOSIS — I714 Abdominal aortic aneurysm, without rupture: Secondary | ICD-10-CM | POA: Diagnosis not present

## 2020-03-10 DIAGNOSIS — S066X0D Traumatic subarachnoid hemorrhage without loss of consciousness, subsequent encounter: Secondary | ICD-10-CM | POA: Diagnosis not present

## 2020-03-10 DIAGNOSIS — M47812 Spondylosis without myelopathy or radiculopathy, cervical region: Secondary | ICD-10-CM | POA: Diagnosis not present

## 2020-03-10 DIAGNOSIS — J439 Emphysema, unspecified: Secondary | ICD-10-CM | POA: Diagnosis not present

## 2020-03-10 DIAGNOSIS — I700 Atherosclerosis of aorta: Secondary | ICD-10-CM | POA: Diagnosis not present

## 2020-03-11 DIAGNOSIS — I10 Essential (primary) hypertension: Secondary | ICD-10-CM | POA: Diagnosis not present

## 2020-03-11 DIAGNOSIS — H8103 Meniere's disease, bilateral: Secondary | ICD-10-CM

## 2020-03-11 DIAGNOSIS — M47812 Spondylosis without myelopathy or radiculopathy, cervical region: Secondary | ICD-10-CM

## 2020-03-11 DIAGNOSIS — M47816 Spondylosis without myelopathy or radiculopathy, lumbar region: Secondary | ICD-10-CM

## 2020-03-11 DIAGNOSIS — Z9181 History of falling: Secondary | ICD-10-CM

## 2020-03-11 DIAGNOSIS — I714 Abdominal aortic aneurysm, without rupture: Secondary | ICD-10-CM

## 2020-03-11 DIAGNOSIS — J439 Emphysema, unspecified: Secondary | ICD-10-CM | POA: Diagnosis not present

## 2020-03-11 DIAGNOSIS — F17210 Nicotine dependence, cigarettes, uncomplicated: Secondary | ICD-10-CM

## 2020-03-11 DIAGNOSIS — M8580 Other specified disorders of bone density and structure, unspecified site: Secondary | ICD-10-CM

## 2020-03-11 DIAGNOSIS — I700 Atherosclerosis of aorta: Secondary | ICD-10-CM | POA: Diagnosis not present

## 2020-03-11 DIAGNOSIS — S066X0D Traumatic subarachnoid hemorrhage without loss of consciousness, subsequent encounter: Secondary | ICD-10-CM | POA: Diagnosis not present

## 2020-03-16 DIAGNOSIS — J439 Emphysema, unspecified: Secondary | ICD-10-CM | POA: Diagnosis not present

## 2020-03-16 DIAGNOSIS — M47812 Spondylosis without myelopathy or radiculopathy, cervical region: Secondary | ICD-10-CM | POA: Diagnosis not present

## 2020-03-16 DIAGNOSIS — I714 Abdominal aortic aneurysm, without rupture: Secondary | ICD-10-CM | POA: Diagnosis not present

## 2020-03-16 DIAGNOSIS — H8103 Meniere's disease, bilateral: Secondary | ICD-10-CM | POA: Diagnosis not present

## 2020-03-16 DIAGNOSIS — M47816 Spondylosis without myelopathy or radiculopathy, lumbar region: Secondary | ICD-10-CM | POA: Diagnosis not present

## 2020-03-16 DIAGNOSIS — I700 Atherosclerosis of aorta: Secondary | ICD-10-CM | POA: Diagnosis not present

## 2020-03-16 DIAGNOSIS — I10 Essential (primary) hypertension: Secondary | ICD-10-CM | POA: Diagnosis not present

## 2020-03-16 DIAGNOSIS — S066X0D Traumatic subarachnoid hemorrhage without loss of consciousness, subsequent encounter: Secondary | ICD-10-CM | POA: Diagnosis not present

## 2020-03-16 DIAGNOSIS — M8580 Other specified disorders of bone density and structure, unspecified site: Secondary | ICD-10-CM | POA: Diagnosis not present

## 2020-03-17 DIAGNOSIS — I700 Atherosclerosis of aorta: Secondary | ICD-10-CM | POA: Diagnosis not present

## 2020-03-17 DIAGNOSIS — M47812 Spondylosis without myelopathy or radiculopathy, cervical region: Secondary | ICD-10-CM | POA: Diagnosis not present

## 2020-03-17 DIAGNOSIS — H8103 Meniere's disease, bilateral: Secondary | ICD-10-CM | POA: Diagnosis not present

## 2020-03-17 DIAGNOSIS — M47816 Spondylosis without myelopathy or radiculopathy, lumbar region: Secondary | ICD-10-CM | POA: Diagnosis not present

## 2020-03-17 DIAGNOSIS — M8580 Other specified disorders of bone density and structure, unspecified site: Secondary | ICD-10-CM | POA: Diagnosis not present

## 2020-03-17 DIAGNOSIS — S066X0D Traumatic subarachnoid hemorrhage without loss of consciousness, subsequent encounter: Secondary | ICD-10-CM | POA: Diagnosis not present

## 2020-03-17 DIAGNOSIS — J439 Emphysema, unspecified: Secondary | ICD-10-CM | POA: Diagnosis not present

## 2020-03-17 DIAGNOSIS — I10 Essential (primary) hypertension: Secondary | ICD-10-CM | POA: Diagnosis not present

## 2020-03-17 DIAGNOSIS — I714 Abdominal aortic aneurysm, without rupture: Secondary | ICD-10-CM | POA: Diagnosis not present

## 2020-03-18 DIAGNOSIS — M47812 Spondylosis without myelopathy or radiculopathy, cervical region: Secondary | ICD-10-CM | POA: Diagnosis not present

## 2020-03-18 DIAGNOSIS — H8103 Meniere's disease, bilateral: Secondary | ICD-10-CM | POA: Diagnosis not present

## 2020-03-18 DIAGNOSIS — S066X0D Traumatic subarachnoid hemorrhage without loss of consciousness, subsequent encounter: Secondary | ICD-10-CM | POA: Diagnosis not present

## 2020-03-18 DIAGNOSIS — I700 Atherosclerosis of aorta: Secondary | ICD-10-CM | POA: Diagnosis not present

## 2020-03-18 DIAGNOSIS — M47816 Spondylosis without myelopathy or radiculopathy, lumbar region: Secondary | ICD-10-CM | POA: Diagnosis not present

## 2020-03-18 DIAGNOSIS — I10 Essential (primary) hypertension: Secondary | ICD-10-CM | POA: Diagnosis not present

## 2020-03-18 DIAGNOSIS — J439 Emphysema, unspecified: Secondary | ICD-10-CM | POA: Diagnosis not present

## 2020-03-18 DIAGNOSIS — M8580 Other specified disorders of bone density and structure, unspecified site: Secondary | ICD-10-CM | POA: Diagnosis not present

## 2020-03-18 DIAGNOSIS — I714 Abdominal aortic aneurysm, without rupture: Secondary | ICD-10-CM | POA: Diagnosis not present

## 2020-03-22 DIAGNOSIS — S066X0D Traumatic subarachnoid hemorrhage without loss of consciousness, subsequent encounter: Secondary | ICD-10-CM | POA: Diagnosis not present

## 2020-03-22 DIAGNOSIS — I714 Abdominal aortic aneurysm, without rupture: Secondary | ICD-10-CM | POA: Diagnosis not present

## 2020-03-22 DIAGNOSIS — I700 Atherosclerosis of aorta: Secondary | ICD-10-CM | POA: Diagnosis not present

## 2020-03-22 DIAGNOSIS — H8103 Meniere's disease, bilateral: Secondary | ICD-10-CM | POA: Diagnosis not present

## 2020-03-22 DIAGNOSIS — I10 Essential (primary) hypertension: Secondary | ICD-10-CM | POA: Diagnosis not present

## 2020-03-22 DIAGNOSIS — M47812 Spondylosis without myelopathy or radiculopathy, cervical region: Secondary | ICD-10-CM | POA: Diagnosis not present

## 2020-03-22 DIAGNOSIS — J439 Emphysema, unspecified: Secondary | ICD-10-CM | POA: Diagnosis not present

## 2020-03-22 DIAGNOSIS — M8580 Other specified disorders of bone density and structure, unspecified site: Secondary | ICD-10-CM | POA: Diagnosis not present

## 2020-03-22 DIAGNOSIS — M47816 Spondylosis without myelopathy or radiculopathy, lumbar region: Secondary | ICD-10-CM | POA: Diagnosis not present

## 2020-03-24 DIAGNOSIS — H8103 Meniere's disease, bilateral: Secondary | ICD-10-CM | POA: Diagnosis not present

## 2020-03-24 DIAGNOSIS — I714 Abdominal aortic aneurysm, without rupture: Secondary | ICD-10-CM | POA: Diagnosis not present

## 2020-03-24 DIAGNOSIS — S066X0D Traumatic subarachnoid hemorrhage without loss of consciousness, subsequent encounter: Secondary | ICD-10-CM | POA: Diagnosis not present

## 2020-03-24 DIAGNOSIS — M47812 Spondylosis without myelopathy or radiculopathy, cervical region: Secondary | ICD-10-CM | POA: Diagnosis not present

## 2020-03-24 DIAGNOSIS — I700 Atherosclerosis of aorta: Secondary | ICD-10-CM | POA: Diagnosis not present

## 2020-03-24 DIAGNOSIS — I10 Essential (primary) hypertension: Secondary | ICD-10-CM | POA: Diagnosis not present

## 2020-03-24 DIAGNOSIS — J439 Emphysema, unspecified: Secondary | ICD-10-CM | POA: Diagnosis not present

## 2020-03-24 DIAGNOSIS — M8580 Other specified disorders of bone density and structure, unspecified site: Secondary | ICD-10-CM | POA: Diagnosis not present

## 2020-03-24 DIAGNOSIS — M47816 Spondylosis without myelopathy or radiculopathy, lumbar region: Secondary | ICD-10-CM | POA: Diagnosis not present

## 2020-03-29 DIAGNOSIS — I714 Abdominal aortic aneurysm, without rupture: Secondary | ICD-10-CM | POA: Diagnosis not present

## 2020-03-29 DIAGNOSIS — M8580 Other specified disorders of bone density and structure, unspecified site: Secondary | ICD-10-CM | POA: Diagnosis not present

## 2020-03-29 DIAGNOSIS — J439 Emphysema, unspecified: Secondary | ICD-10-CM | POA: Diagnosis not present

## 2020-03-29 DIAGNOSIS — S066X0D Traumatic subarachnoid hemorrhage without loss of consciousness, subsequent encounter: Secondary | ICD-10-CM | POA: Diagnosis not present

## 2020-03-29 DIAGNOSIS — M47816 Spondylosis without myelopathy or radiculopathy, lumbar region: Secondary | ICD-10-CM | POA: Diagnosis not present

## 2020-03-29 DIAGNOSIS — H8103 Meniere's disease, bilateral: Secondary | ICD-10-CM | POA: Diagnosis not present

## 2020-03-29 DIAGNOSIS — I10 Essential (primary) hypertension: Secondary | ICD-10-CM | POA: Diagnosis not present

## 2020-03-29 DIAGNOSIS — M47812 Spondylosis without myelopathy or radiculopathy, cervical region: Secondary | ICD-10-CM | POA: Diagnosis not present

## 2020-03-29 DIAGNOSIS — I700 Atherosclerosis of aorta: Secondary | ICD-10-CM | POA: Diagnosis not present

## 2020-03-31 DIAGNOSIS — M47812 Spondylosis without myelopathy or radiculopathy, cervical region: Secondary | ICD-10-CM | POA: Diagnosis not present

## 2020-03-31 DIAGNOSIS — H8103 Meniere's disease, bilateral: Secondary | ICD-10-CM | POA: Diagnosis not present

## 2020-03-31 DIAGNOSIS — M47816 Spondylosis without myelopathy or radiculopathy, lumbar region: Secondary | ICD-10-CM | POA: Diagnosis not present

## 2020-03-31 DIAGNOSIS — I714 Abdominal aortic aneurysm, without rupture: Secondary | ICD-10-CM | POA: Diagnosis not present

## 2020-03-31 DIAGNOSIS — R030 Elevated blood-pressure reading, without diagnosis of hypertension: Secondary | ICD-10-CM | POA: Insufficient documentation

## 2020-03-31 DIAGNOSIS — I609 Nontraumatic subarachnoid hemorrhage, unspecified: Secondary | ICD-10-CM | POA: Diagnosis not present

## 2020-03-31 DIAGNOSIS — S066X0D Traumatic subarachnoid hemorrhage without loss of consciousness, subsequent encounter: Secondary | ICD-10-CM | POA: Diagnosis not present

## 2020-03-31 DIAGNOSIS — J439 Emphysema, unspecified: Secondary | ICD-10-CM | POA: Diagnosis not present

## 2020-03-31 DIAGNOSIS — M8580 Other specified disorders of bone density and structure, unspecified site: Secondary | ICD-10-CM | POA: Diagnosis not present

## 2020-03-31 DIAGNOSIS — I700 Atherosclerosis of aorta: Secondary | ICD-10-CM | POA: Diagnosis not present

## 2020-03-31 DIAGNOSIS — I10 Essential (primary) hypertension: Secondary | ICD-10-CM | POA: Diagnosis not present

## 2020-04-01 ENCOUNTER — Ambulatory Visit: Payer: Medicare HMO | Admitting: Physical Medicine & Rehabilitation

## 2020-04-02 ENCOUNTER — Encounter: Payer: Self-pay | Admitting: Physical Medicine & Rehabilitation

## 2020-04-02 ENCOUNTER — Other Ambulatory Visit: Payer: Self-pay

## 2020-04-02 ENCOUNTER — Encounter: Payer: Medicare HMO | Attending: Physical Medicine & Rehabilitation | Admitting: Physical Medicine & Rehabilitation

## 2020-04-02 VITALS — BP 127/84 | HR 66 | Temp 98.5°F | Ht 64.0 in | Wt 111.0 lb

## 2020-04-02 DIAGNOSIS — I609 Nontraumatic subarachnoid hemorrhage, unspecified: Secondary | ICD-10-CM | POA: Diagnosis not present

## 2020-04-02 DIAGNOSIS — I714 Abdominal aortic aneurysm, without rupture, unspecified: Secondary | ICD-10-CM

## 2020-04-02 DIAGNOSIS — I607 Nontraumatic subarachnoid hemorrhage from unspecified intracranial artery: Secondary | ICD-10-CM | POA: Insufficient documentation

## 2020-04-02 DIAGNOSIS — I10 Essential (primary) hypertension: Secondary | ICD-10-CM | POA: Diagnosis not present

## 2020-04-02 DIAGNOSIS — Z1159 Encounter for screening for other viral diseases: Secondary | ICD-10-CM | POA: Diagnosis not present

## 2020-04-02 DIAGNOSIS — E782 Mixed hyperlipidemia: Secondary | ICD-10-CM | POA: Diagnosis not present

## 2020-04-02 NOTE — Patient Instructions (Signed)
Do not think further outpt therapy is necessary

## 2020-04-02 NOTE — Progress Notes (Signed)
Subjective:    Patient ID: Autumn Le, female    DOB: October 16, 1946, 74 y.o.   MRN: [id number]  74 y.o. female with history of COPD, vertigo; who was admitted via [facility] on 01/23/2020 with mild headache, lightheadedness upper neck pain with reports of blood near her bed and no recall of 48 hours prior to admission.  CT head showed extensive SAH about right cerebral hemisphere and basal cisterns.  CTA head/neck revealed 9 mm aneurysm of communicating segment of right ICA which was likely the culprit for hemorrhage and additional approximately 4 mm aneurysm right A2 ACA.  She was transferred to [facility] for work-up and underwent cerebral angio with successful coil embolization of right PCA aneurysm.  She was placed on Nimotop for supportive care x21 days and postprocedure continued to have issues with significant neck pain and headaches.  Methylprednisolone was added on 3/20 to help ameliorate symptoms.  Therapy was ongoing and patient was noted to be limited by pain, cognitive deficits, balance deficits as well as dizziness and nausea.  CIR was recommended to functional deficits.  Admit date: 01/31/2020 Discharge date: 02/12/2020  HPI  Patient does not remember me and does not remember being in the rehab unit.  We discussed the incidental finding of her abdominal aortic aneurysm and the need for vascular surgery follow-up she has already been scheduled. She has followed up with her vascular neurosurgeon yesterday. Discharged to sister's house Still has Home health until next week  No longer needing walker  Has been to church and goes to her Bible study group.  Has plans to go to [facility] to stay with family for about a month  Per sister ok to drive per neurosurgeon during the visit yesterday.  She has not tried driving. Sister has not noted cognitive issues  No pains, independent with all self-care and mobility.  Able to walk 3 flights of steps without assistive device pain  Inventory Average Pain 0 Pain Right Now 0 My pain is na  In the last 24 hours, has pain interfered with the following? General activity 0 Relation with others 0 Enjoyment of life 0 What TIME of day is your pain at its worst? na Sleep (in general) Good  Pain is worse with: na Pain improves with: na Relief from Meds: na  Mobility walk without assistance ability to climb steps?  yes do you drive?  yes  Function retired  Neuro/Psych No problems in this area  Prior Studies Any changes since last visit?  no  Physicians involved in your care Any changes since last visit?  no Primary care .   Family History  Problem Relation Age of Onset  . Breast cancer Maternal Aunt    Social History   Socioeconomic History  . Marital status: Single    Spouse name: Not on file  . Number of children: Not on file  . Years of education: Not on file  . Highest education level: Not on file  Occupational History  . Not on file  Tobacco Use  . Smoking status: Current Every Day Smoker  . Smokeless tobacco: Never Used  Substance and Sexual Activity  . Alcohol use: Not Currently  . Drug use: Not Currently  . Sexual activity: Not on file  Other Topics Concern  . Not on file  Social History Narrative  . Not on file   Social Determinants of Health   Financial Resource Strain:   . Difficulty of Paying Living Expenses:   Food  Insecurity:   . Worried About Programme researcher, broadcasting/film/video in the Last Year:   . Barista in the Last Year:   Transportation Needs:   . Freight forwarder (Medical):   Marland Kitchen Lack of Transportation (Non-Medical):   Physical Activity:   . Days of Exercise per Week:   . Minutes of Exercise per Session:   Stress:   . Feeling of Stress :   Social Connections:   . Frequency of Communication with Friends and Family:   . Frequency of Social Gatherings with Friends and Family:   . Attends Religious Services:   . Active Member of Clubs or Organizations:   .  Attends Banker Meetings:   Marland Kitchen Marital Status:    Past Surgical History:  Procedure Laterality Date  . IR ANGIO INTRA EXTRACRAN SEL INTERNAL CAROTID BILAT MOD SED  01/23/2020  . IR ANGIO VERTEBRAL SEL VERTEBRAL UNI L MOD SED  01/23/2020  . IR ANGIOGRAM FOLLOW UP STUDY  01/23/2020  . IR ANGIOGRAM FOLLOW UP STUDY  01/23/2020  . IR ANGIOGRAM FOLLOW UP STUDY  01/23/2020  . IR ANGIOGRAM FOLLOW UP STUDY  01/23/2020  . IR TRANSCATH/EMBOLIZ  01/23/2020  . RADIOLOGY WITH ANESTHESIA N/A 01/23/2020   Procedure: RADIOLOGY WITH ANESTHESIA;  Surgeon: Lisbeth Renshaw, MD;  Location: [facility] OR;  Service: Radiology;  Laterality: N/A;  . TONSILLECTOMY     Past Medical History:  Diagnosis Date  . COPD (chronic obstructive pulmonary disease) (HCC)   . Degenerative joint disease of cervical spine   . Meniere's disease in remission, bilateral   . Osteopenia   . Vertigo    BP 127/84   Pulse 66   Temp 98.5 F (36.9 C)   Ht 5\' 4"  (1.626 m)   Wt 111 lb (50.3 kg)   SpO2 96%   BMI 19.05 kg/m   Opioid Risk Score:   Fall Risk Score:  `1  Depression screen PHQ 2/9  Depression screen PHQ 2/9 02/24/2020  Decreased Interest 0  Down, Depressed, Hopeless 0  PHQ - 2 Score 0  Altered sleeping 0  Tired, decreased energy 1  Change in appetite 0  Feeling bad or failure about yourself  0  Trouble concentrating 0  Moving slowly or fidgety/restless 0  Suicidal thoughts 0  PHQ-9 Score 1  Difficult doing work/chores Somewhat difficult    Review of Systems  All other systems reviewed and are negative.      Objective:   Physical Exam Vitals and nursing note reviewed.  Constitutional:      Appearance: Normal appearance.  HENT:     Head: Normocephalic and atraumatic.  Eyes:     Extraocular Movements: Extraocular movements intact.     Conjunctiva/sclera: Conjunctivae normal.     Pupils: Pupils are equal, round, and reactive to light.  Cardiovascular:     Rate and Rhythm: Normal rate and regular  rhythm.     Pulses: Normal pulses.     Heart sounds: Normal heart sounds. No murmur.  Pulmonary:     Effort: Pulmonary effort is normal.     Breath sounds: Normal breath sounds.  Abdominal:     General: Abdomen is flat. Bowel sounds are normal.     Palpations: Abdomen is soft.  Skin:    General: Skin is warm and dry.  Neurological:     General: No focal deficit present.     Mental Status: She is alert and oriented to person, place, and time.  Comments: Motor strength is 5/5 bilateral deltoid bicep tricep grip hip flexor knee extensor ankle dorsiflexors line  Cerebellar: There is no evidence of dysmetria with finger-nose-finger testing Sensation equal to light touch bilateral upper and lower limbs No evidence of facial droop Visual fields are intact confrontation testing Extraocular muscles are intact Romberg is negative Ambulates without assistive device able to toe walk as well as full forearm tandem gait.   Psychiatric:        Mood and Affect: Mood normal.        Behavior: Behavior normal.           Assessment & Plan:   1.  History of subarachnoid hemorrhage due to rupture of cerebral aneurysm.  She has had an excellent functional recovery has been to her baseline level. She will finish her home health care for next week.  I do not know if she will require any outpatient therapy due to her excellent recovery. She will follow-up with neurosurgery Dr. Conchita Paris She will follow-up with vascular surgery in regards to her abdominal aortic aneurysm September, Dr. Darrick Penna

## 2020-04-06 DIAGNOSIS — I700 Atherosclerosis of aorta: Secondary | ICD-10-CM | POA: Diagnosis not present

## 2020-04-06 DIAGNOSIS — J439 Emphysema, unspecified: Secondary | ICD-10-CM | POA: Diagnosis not present

## 2020-04-06 DIAGNOSIS — I714 Abdominal aortic aneurysm, without rupture: Secondary | ICD-10-CM | POA: Diagnosis not present

## 2020-04-06 DIAGNOSIS — H8103 Meniere's disease, bilateral: Secondary | ICD-10-CM | POA: Diagnosis not present

## 2020-04-06 DIAGNOSIS — M47816 Spondylosis without myelopathy or radiculopathy, lumbar region: Secondary | ICD-10-CM | POA: Diagnosis not present

## 2020-04-06 DIAGNOSIS — M8580 Other specified disorders of bone density and structure, unspecified site: Secondary | ICD-10-CM | POA: Diagnosis not present

## 2020-04-06 DIAGNOSIS — M47812 Spondylosis without myelopathy or radiculopathy, cervical region: Secondary | ICD-10-CM | POA: Diagnosis not present

## 2020-04-06 DIAGNOSIS — S066X0D Traumatic subarachnoid hemorrhage without loss of consciousness, subsequent encounter: Secondary | ICD-10-CM | POA: Diagnosis not present

## 2020-04-06 DIAGNOSIS — I10 Essential (primary) hypertension: Secondary | ICD-10-CM | POA: Diagnosis not present

## 2020-04-07 ENCOUNTER — Ambulatory Visit: Admission: RE | Admit: 2020-04-07 | Payer: Medicare HMO | Source: Ambulatory Visit

## 2020-04-07 DIAGNOSIS — M8580 Other specified disorders of bone density and structure, unspecified site: Secondary | ICD-10-CM | POA: Diagnosis not present

## 2020-04-07 DIAGNOSIS — J439 Emphysema, unspecified: Secondary | ICD-10-CM | POA: Diagnosis not present

## 2020-04-07 DIAGNOSIS — I10 Essential (primary) hypertension: Secondary | ICD-10-CM | POA: Diagnosis not present

## 2020-04-07 DIAGNOSIS — M47812 Spondylosis without myelopathy or radiculopathy, cervical region: Secondary | ICD-10-CM | POA: Diagnosis not present

## 2020-04-07 DIAGNOSIS — I714 Abdominal aortic aneurysm, without rupture: Secondary | ICD-10-CM | POA: Diagnosis not present

## 2020-04-07 DIAGNOSIS — M47816 Spondylosis without myelopathy or radiculopathy, lumbar region: Secondary | ICD-10-CM | POA: Diagnosis not present

## 2020-04-07 DIAGNOSIS — S066X0D Traumatic subarachnoid hemorrhage without loss of consciousness, subsequent encounter: Secondary | ICD-10-CM | POA: Diagnosis not present

## 2020-04-07 DIAGNOSIS — H8103 Meniere's disease, bilateral: Secondary | ICD-10-CM | POA: Diagnosis not present

## 2020-04-07 DIAGNOSIS — I700 Atherosclerosis of aorta: Secondary | ICD-10-CM | POA: Diagnosis not present

## 2020-04-09 DIAGNOSIS — Z0000 Encounter for general adult medical examination without abnormal findings: Secondary | ICD-10-CM | POA: Diagnosis not present

## 2020-04-09 DIAGNOSIS — F17210 Nicotine dependence, cigarettes, uncomplicated: Secondary | ICD-10-CM | POA: Diagnosis not present

## 2020-04-27 ENCOUNTER — Other Ambulatory Visit (HOSPITAL_COMMUNITY): Payer: Self-pay | Admitting: Neurosurgery

## 2020-04-27 ENCOUNTER — Other Ambulatory Visit: Payer: Self-pay | Admitting: Neurosurgery

## 2020-04-27 DIAGNOSIS — I609 Nontraumatic subarachnoid hemorrhage, unspecified: Secondary | ICD-10-CM

## 2020-05-24 ENCOUNTER — Other Ambulatory Visit: Payer: Self-pay | Admitting: Neurosurgery

## 2020-05-27 NOTE — Progress Notes (Signed)
Your procedure is scheduled on Friday July 23.  Report to [facility] Main Entrance "A" at 06:30 A.M., and check in at the Admitting office.  Call this number if you have problems the morning of surgery: [phone]  Call [phone] if you have any questions prior to your surgery date Monday-Friday 8am-4pm   Remember: Do not eat or drink after midnight the night before your surgery  Take these medicines the morning of surgery with A SIP OF WATER: amLODipine (NORVASC)  fluticasone (FLONASE) pantoprazole (PROTONIX) fluticasone furoate-vilanterol (BREO ELLIPTA) ---- Please bring all inhalers with you the day of surgery.    If needed: acetaminophen (TYLENOL)  traMADol (ULTRAM)   As of today, STOP taking any Aspirin (unless otherwise instructed by your surgeon), Aleve, Naproxen, Ibuprofen, Motrin, Advil, Goody's, BC's, all herbal medications, fish oil, and all vitamins.    The Morning of Surgery  Do not wear jewelry, make-up or nail polish.  Do not wear lotions, powders, or perfumes, or deodorant  Do not shave 48 hours prior to surgery.    Do not bring valuables to the hospital.  [facility] is not responsible for any belongings or valuables.  If you are a smoker, DO NOT Smoke 24 hours prior to surgery  If you wear a CPAP at night please bring your mask the morning of surgery   Remember that you must have someone to transport you home after your surgery, and remain with you for 24 hours if you are discharged the same day.   Please bring cases for contacts, glasses, hearing aids, dentures or bridgework because it cannot be worn into surgery.    Leave your suitcase in the car.  After surgery it may be brought to your room.  For patients admitted to the hospital, discharge time will be determined by your treatment team.  Patients discharged the day of surgery will not be allowed to drive home.    Special instructions:   [place]- Preparing For Surgery  Before  surgery, you can play an important role. Because skin is not sterile, your skin needs to be as free of germs as possible. You can reduce the number of germs on your skin by washing with CHG (chlorahexidine gluconate) Soap before surgery.  CHG is an antiseptic cleaner which kills germs and bonds with the skin to continue killing germs even after washing.    Oral Hygiene is also important to reduce your risk of infection.  Remember - BRUSH YOUR TEETH THE MORNING OF SURGERY WITH YOUR REGULAR TOOTHPASTE  Please do not use if you have an allergy to CHG or antibacterial soaps. If your skin becomes reddened/irritated stop using the CHG.  Do not shave (including legs and underarms) for at least 48 hours prior to first CHG shower. It is OK to shave your face.  Please follow these instructions carefully.   1. Shower the NIGHT BEFORE SURGERY and the MORNING OF SURGERY with CHG Soap.   2. If you chose to wash your hair and body, wash as usual with your normal shampoo and body-wash/soap.  3. Rinse your hair and body thoroughly to remove the shampoo and soap.  4. Apply CHG directly to the skin (ONLY FROM THE NECK DOWN) and wash gently with a scrungie or a clean washcloth.   5. Do not use on open wounds or open sores. Avoid contact with your eyes, ears, mouth and genitals (private parts). Wash Face and genitals (private parts)  with your normal soap.  6. Wash thoroughly, paying special attention to the area where your surgery will be performed.  7. Thoroughly rinse your body with warm water from the neck down.  8. DO NOT shower/wash with your normal soap after using and rinsing off the CHG Soap.  9. Pat yourself dry with a CLEAN TOWEL.  10. Wear CLEAN PAJAMAS to bed the night before surgery  11. Place CLEAN SHEETS on your bed the night of your first shower and DO NOT SLEEP WITH PETS.  12. Wear comfortable clothes the morning of surgery.     Day of Surgery:  Please shower the morning of surgery  with the CHG soap Do not apply any deodorants/lotions. Please wear clean clothes to the hospital/surgery center.   Remember to brush your teeth WITH YOUR REGULAR TOOTHPASTE.   Please read over the following fact sheets that you were given.

## 2020-05-28 ENCOUNTER — Encounter (HOSPITAL_COMMUNITY)
Admission: RE | Admit: 2020-05-28 | Discharge: 2020-05-28 | Disposition: A | Discharge status: Home / Self Care | Payer: Medicare HMO | Source: Ambulatory Visit | Attending: Neurosurgery | Admitting: Neurosurgery

## 2020-05-28 ENCOUNTER — Other Ambulatory Visit: Payer: Self-pay

## 2020-05-28 ENCOUNTER — Encounter (HOSPITAL_COMMUNITY): Payer: Self-pay

## 2020-05-28 DIAGNOSIS — I714 Abdominal aortic aneurysm, without rupture: Secondary | ICD-10-CM | POA: Insufficient documentation

## 2020-05-28 DIAGNOSIS — I609 Nontraumatic subarachnoid hemorrhage, unspecified: Secondary | ICD-10-CM | POA: Insufficient documentation

## 2020-05-28 DIAGNOSIS — J449 Chronic obstructive pulmonary disease, unspecified: Secondary | ICD-10-CM | POA: Insufficient documentation

## 2020-05-28 DIAGNOSIS — K219 Gastro-esophageal reflux disease without esophagitis: Secondary | ICD-10-CM | POA: Diagnosis not present

## 2020-05-28 DIAGNOSIS — Z01812 Encounter for preprocedural laboratory examination: Secondary | ICD-10-CM | POA: Diagnosis not present

## 2020-05-28 DIAGNOSIS — I10 Essential (primary) hypertension: Secondary | ICD-10-CM | POA: Diagnosis not present

## 2020-05-28 DIAGNOSIS — E785 Hyperlipidemia, unspecified: Secondary | ICD-10-CM | POA: Diagnosis not present

## 2020-05-28 DIAGNOSIS — Z87891 Personal history of nicotine dependence: Secondary | ICD-10-CM | POA: Diagnosis not present

## 2020-05-28 DIAGNOSIS — Z7901 Long term (current) use of anticoagulants: Secondary | ICD-10-CM | POA: Insufficient documentation

## 2020-05-28 DIAGNOSIS — Z79899 Other long term (current) drug therapy: Secondary | ICD-10-CM | POA: Insufficient documentation

## 2020-05-28 HISTORY — DX: Abdominal aortic aneurysm, without rupture, unspecified: I71.40

## 2020-05-28 HISTORY — DX: Abdominal aortic aneurysm, without rupture: I71.4

## 2020-05-28 HISTORY — DX: Hyperlipidemia, unspecified: E78.5

## 2020-05-28 HISTORY — DX: Essential (primary) hypertension: I10

## 2020-05-28 HISTORY — DX: Gastro-esophageal reflux disease without esophagitis: K21.9

## 2020-05-28 HISTORY — DX: Nontraumatic subarachnoid hemorrhage, unspecified: I60.9

## 2020-05-28 LAB — CBC WITH DIFFERENTIAL/PLATELET
Abs Immature Granulocytes: 0.03 10*3/uL (ref 0.00–0.07)
Basophils Absolute: 0.1 10*3/uL (ref 0.0–0.1)
Basophils Relative: 1 %
Eosinophils Absolute: 0.1 10*3/uL (ref 0.0–0.5)
Eosinophils Relative: 2 %
HCT: 41 % (ref 36.0–46.0)
Hemoglobin: 12.6 g/dL (ref 12.0–15.0)
Immature Granulocytes: 0 %
Lymphocytes Relative: 20 %
Lymphs Abs: 1.6 10*3/uL (ref 0.7–4.0)
MCH: 30.8 pg (ref 26.0–34.0)
MCHC: 30.7 g/dL (ref 30.0–36.0)
MCV: 100.2 fL — ABNORMAL HIGH (ref 80.0–100.0)
Monocytes Absolute: 0.6 10*3/uL (ref 0.1–1.0)
Monocytes Relative: 7 %
Neutro Abs: 5.6 10*3/uL (ref 1.7–7.7)
Neutrophils Relative %: 70 %
Platelets: 192 10*3/uL (ref 150–400)
RBC: 4.09 MIL/uL (ref 3.87–5.11)
RDW: 11.8 % (ref 11.5–15.5)
WBC: 7.9 10*3/uL (ref 4.0–10.5)
nRBC: 0 % (ref 0.0–0.2)

## 2020-05-28 LAB — TYPE AND SCREEN
ABO/RH(D): A POS
Antibody Screen: NEGATIVE

## 2020-05-28 LAB — APTT: aPTT: 28 seconds (ref 24–36)

## 2020-05-28 LAB — BASIC METABOLIC PANEL
Anion gap: 9 (ref 5–15)
BUN: 20 mg/dL (ref 8–23)
CO2: 22 mmol/L (ref 22–32)
Calcium: 9.3 mg/dL (ref 8.9–10.3)
Chloride: 108 mmol/L (ref 98–111)
Creatinine, Ser: 0.96 mg/dL (ref 0.44–1.00)
GFR calc Af Amer: >60 mL/min (ref >60)
GFR calc non Af Amer: 59 mL/min — ABNORMAL LOW (ref >60)
Glucose, Bld: 102 mg/dL — ABNORMAL HIGH (ref 70–99)
Potassium: 4 mmol/L (ref 3.5–5.1)
Sodium: 139 mmol/L (ref 135–145)

## 2020-05-28 LAB — PROTIME-INR
INR: 1 (ref 0.8–1.2)
Prothrombin Time: 12.3 seconds (ref 11.4–15.2)

## 2020-05-28 NOTE — Progress Notes (Signed)
PCP - Marisue Ivan Cardiologist - patient denies  PPM/ICD - n/a Device Orders -  Rep Notified -   Chest x-ray -  EKG - 01/26/2020 Stress Test - patient denies ECHO - patient denies Cardiac Cath - patient denies  Sleep Study - patient denies CPAP -   Fasting Blood Sugar - n/a Checks Blood Sugar _____ times a day  Blood Thinner Instructions: n/a Aspirin Instructions: n/a  ERAS Protcol - n/a PRE-SURGERY Ensure or G2-   COVID TEST-  7/20 at [facility]   Anesthesia review: recent cerebral aneurysm/ [facility] 01/23/20  Patient denies shortness of breath, fever, cough and chest pain at PAT appointment   All instructions explained to the patient, with a verbal understanding of the material. Patient agrees to go over the instructions while at home for a better understanding. Patient also instructed to self quarantine after being tested for COVID-19. The opportunity to ask questions was provided.

## 2020-05-31 ENCOUNTER — Encounter (HOSPITAL_COMMUNITY): Payer: Self-pay

## 2020-05-31 NOTE — Progress Notes (Addendum)
Anesthesia Chart Review:  Case: [id number] Date/Time: 06/04/20 0815   Procedure: Coil embolization of left ACA aneurysm (N/A )   Anesthesia type: General   Pre-op diagnosis: I60.9 Subarachnoid hemorrhage   [facility]: [facility] OR ROOM 18 / [facility] OR   Surgeons: Lisbeth Renshaw, MD      DISCUSSION: Patient is a 74 year old female scheduled for the above procedure. She is s/p [place] admission 01/23/20-01/30/20 for disorientation and apparent fall and was diagnosed with SAH likely due to ruptured right Pcom aneurysm with finding of unruptured right distal ACA and left cavernous aneurysms. She underwent diagnostic cerebral angiogram and treatment of the right posterior communicating artery aneurysm via coil embolization on 01/23/20 by Dr. Conchita Paris. She was discharged to CIR, where she stayed until 02/09/20 to improve activity tolerance and balance/coordination.   Other history includes former smoker (quit 01/23/20), vertigo/Meniere's disease, COPD, GERD, AAA (4.6 cm 01/2020), HLD. Amlodipine added for HTN during CIR admission following SAH embolization.   01/23/20 EKG done in ED when diagnosed with SAH showed SR, short PR, septal infarct (age undetermined), and anterior T wave inversion, consider ischemia. She underwent emergent coiling of ruptured PCOM aneurysm. Now she is for intervention of unruptured cerebral aneurysm. She denied chest pain and SOB. She did have recent evaluation by her PCP Marisue Ivan, MD with stable exam. She declined treatment for HLD.   Reviewed chart with anesthesiologist Autumn Patty, MD. Recommend requesting PCP review EKG and provide preoperative input (clearance versus other recommendations, if any). I have spoken with Lowella Bandy at Dr. Val Riles office and sent her a copy of EKG to sent with request for clearance form. She did confirm that patient was not prescribed any anti-platelet therapy for this procedure.  Preoperative Covid testing is scheduled for 06/01/2020.    ADDENDUM 06/02/20 3:51 PM: Patient was seen at [facility] on 06/01/20 by Debbra Riding, PA for preoperative evaluation.  She denied exertional chest pain or syncope and was able to walk a flight of stairs without cardiopulmonary symptoms. By notes, EKG repeated and showed "sinus bradycardia 59 bpm.  No significant St wave changes. Some nonspecific T wave changes but not consistent with patient's history." (Tracing requested.) He did not feel she would require any further work-up priot to undergoing coil embolization of cerebral aneurysm and added, "She appears to be low risk for a moderate risk surgery." (See [facility] Everywhere) 06/01/2020 COVID-19 test negative.    VS: BP 114/66   Pulse 60   Temp 36.4 C (Oral)   Resp 18   Ht 5\' 4"  (1.626 m)   Wt 51.4 kg   SpO2 100%   BMI 19.47 kg/m     PROVIDERS: , MD is PCP [facility], see [facility] Everywhere). Last visit 04/09/20 for preventative health exam. Patient no longer using walker. Was exercising by notes. Denied exertional chest pain and syncope.   04/11/20, MD is vascular surgeon. Consult 02/06/20 while in CIR due to finding of 4.6 cm AAA. Six month follow-up recommended (schedueld for 08/12/20). He would consider repair if reaches 5-5.5 cm.    LABS: Labs reviewed: Acceptable for surgery. (all labs ordered are listed, but only abnormal results are displayed)  Labs Reviewed  BASIC METABOLIC PANEL - Abnormal; Notable for the following components:      Result Value   Glucose, Bld 102 (*)    GFR calc non Af Amer 59 (*)    All other components within normal limits  CBC WITH DIFFERENTIAL/PLATELET -  Abnormal; Notable for the following components:   MCV 100.2 (*)    All other components within normal limits  APTT  PROTIME-INR  TYPE AND SCREEN     IMAGES: Diagnostic cerebral angiogram with coil embolization right PCOM 01/23/20: IMPRESSION: 1. Successful coil embolization of a  ruptured right posterior communicating artery aneurysm, without aneurysm residual after coiling. Fetal type right posterior cerebral artery remains widely patent. 2.  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 3. 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   EKG: 01/23/20: Sinus rhythm with short PR Septal infarct , age undetermined ST & T wave abnormality, consider anterior ischemia Abnormal ECG No previous ECGs available   CV: US Aorta 02/05/20: IMPRESSION: - Distal abdominal aortic aneurysm with maximum transverse diameter of 4.2 x 4.6 cm. Aneurysm extends over approximately 4.6 cm. Peripheral thrombus noted in the area of aneurysm. - There is aortic atherosclerosis.  No periaortic fluid or adenopathy. - Recommend followup by abdomen and pelvis CTA in 6 months, and vascular surgery referral/consultation if not already obtained. This recommendation follows ACR consensus guidelines: White Paper of the ACR Incidental Findings Committee II on Vascular Findings. J Am Coll Radiol 2013; 10:789-794.   Transcranial Doppler (post-SAH) 01/30/20: Summary:  Most of the right-sided intracranial vessels were not insonated due to  lack of right transtemporal window. Among all insonated vessels, MFVs and  flow directions were with normal limits except slightly elevated basilar  artery flow velocity.  Globally increased pulsatility suggesting increased intracranial pressure,  however, this has improved from last TCD.     Past Medical History:  Diagnosis Date  . AAA (abdominal aortic aneurysm) (HCC)    4.6 cm AAA 01/2020, 6 month f/u rec (Vascular surgeon: Fabienne Bruns, MD)  . COPD (chronic obstructive pulmonary disease) (HCC)    patient denies - states she has no issues  . Degenerative joint disease of cervical spine   . GERD (gastroesophageal reflux disease)   . Meniere's disease in remission, bilateral   . Osteopenia   .  Subarachnoid hemorrhage (HCC)   . Vertigo     Past Surgical History:  Procedure Laterality Date  . CATARACT EXTRACTION W/ INTRAOCULAR LENS  IMPLANT, BILATERAL    . EYE SURGERY    . IR ANGIO INTRA EXTRACRAN SEL INTERNAL CAROTID BILAT MOD SED  01/23/2020  . IR ANGIO VERTEBRAL SEL VERTEBRAL UNI L MOD SED  01/23/2020  . IR ANGIOGRAM FOLLOW UP STUDY  01/23/2020  . IR ANGIOGRAM FOLLOW UP STUDY  01/23/2020  . IR ANGIOGRAM FOLLOW UP STUDY  01/23/2020  . IR ANGIOGRAM FOLLOW UP STUDY  01/23/2020  . IR TRANSCATH/EMBOLIZ  01/23/2020  . RADIOLOGY WITH ANESTHESIA N/A 01/23/2020   Procedure: RADIOLOGY WITH ANESTHESIA;  Surgeon: Lisbeth Renshaw, MD;  Location: [facility] OR;  Service: Radiology;  Laterality: N/A;  . TONSILLECTOMY      MEDICATIONS: . acetaminophen (TYLENOL) 325 MG tablet  . amLODipine (NORVASC) 2.5 MG tablet  . Calcium Carbonate (CALCIUM 600 PO)  . Cholecalciferol 50 MCG (2000 UT) CAPS  . fluticasone (FLONASE) 50 MCG/ACT nasal spray  . fluticasone furoate-vilanterol (BREO ELLIPTA) 100-25 MCG/INH AEPB  . loratadine (CLARITIN) 10 MG tablet  . niMODipine (NIMOTOP) 30 MG capsule  . pantoprazole (PROTONIX) 40 MG tablet  . potassium chloride (KLOR-CON M15) 15 MEQ tablet  . topiramate (TOPAMAX) 25 MG tablet  . traMADol (ULTRAM) 50 MG tablet   No current facility-administered medications for this encounter.   Not currently taking Nimotop (  ordered for 21 day course post-coil embolization for vasospasm prevention).    Shonna Chock, PA-C Surgical Short Stay/Anesthesiology [facility] Phone [phone] [facility] Phone [phone] 05/31/2020 5:05 PM

## 2020-06-01 ENCOUNTER — Other Ambulatory Visit: Payer: Self-pay

## 2020-06-01 ENCOUNTER — Other Ambulatory Visit
Admission: RE | Admit: 2020-06-01 | Discharge: 2020-06-01 | Disposition: A | Discharge status: Home / Self Care | Payer: Medicare HMO | Source: Ambulatory Visit | Attending: Neurosurgery | Admitting: Neurosurgery

## 2020-06-01 DIAGNOSIS — Z01812 Encounter for preprocedural laboratory examination: Secondary | ICD-10-CM | POA: Insufficient documentation

## 2020-06-01 DIAGNOSIS — Z01818 Encounter for other preprocedural examination: Secondary | ICD-10-CM | POA: Diagnosis not present

## 2020-06-01 DIAGNOSIS — Z8679 Personal history of other diseases of the circulatory system: Secondary | ICD-10-CM | POA: Diagnosis not present

## 2020-06-01 DIAGNOSIS — Z20822 Contact with and (suspected) exposure to covid-19: Secondary | ICD-10-CM | POA: Diagnosis not present

## 2020-06-01 DIAGNOSIS — E782 Mixed hyperlipidemia: Secondary | ICD-10-CM | POA: Diagnosis not present

## 2020-06-01 DIAGNOSIS — J438 Other emphysema: Secondary | ICD-10-CM | POA: Diagnosis not present

## 2020-06-01 LAB — SARS CORONAVIRUS 2 (TAT 6-24 HRS): SARS Coronavirus 2: NEGATIVE

## 2020-06-02 NOTE — Anesthesia Preprocedure Evaluation (Addendum)
Anesthesia Evaluation  Patient identified by MRN, date of birth, ID band Patient awake    Reviewed: Allergy & Precautions, NPO status   Airway Mallampati: I  TM Distance: >3 FB Neck ROM: Full    Dental   Pulmonary COPD, former smoker,    Pulmonary exam normal        Cardiovascular hypertension, Pt. on medications Normal cardiovascular exam     Neuro/Psych Intracranial Aneurysm    GI/Hepatic GERD  Medicated and Controlled,  Endo/Other    Renal/GU      Musculoskeletal   Abdominal   Peds  Hematology   Anesthesia Other Findings   Reproductive/Obstetrics                             Anesthesia Physical Anesthesia Plan  ASA: III  Anesthesia Plan: General   Post-op Pain Management:    Induction: Intravenous  PONV Risk Score and Plan: 3 and Ondansetron, Midazolam and Treatment may vary due to age or medical condition  Airway Management Planned: Oral ETT  Additional Equipment: Arterial line  Intra-op Plan:   Post-operative Plan: Extubation in OR  Informed Consent: I have reviewed the patients History and Physical, chart, labs and discussed the procedure including the risks, benefits and alternatives for the proposed anesthesia with the patient or authorized representative who has indicated his/her understanding and acceptance.       Plan Discussed with: CRNA and Surgeon  Anesthesia Plan Comments: (PAT note written by Shonna Chock, PA-C. )       Anesthesia Quick Evaluation

## 2020-06-04 ENCOUNTER — Encounter (HOSPITAL_COMMUNITY)
Admission: RE | Disposition: A | Discharge status: Home / Self Care | Payer: Self-pay | Source: Home / Self Care | Attending: Neurosurgery

## 2020-06-04 ENCOUNTER — Encounter (HOSPITAL_COMMUNITY): Payer: Self-pay | Admitting: Neurosurgery

## 2020-06-04 ENCOUNTER — Inpatient Hospital Stay (HOSPITAL_COMMUNITY): Payer: Medicare HMO | Admitting: Vascular Surgery

## 2020-06-04 ENCOUNTER — Ambulatory Visit (HOSPITAL_COMMUNITY)
Admission: RE | Admit: 2020-06-04 | Discharge: 2020-06-04 | Disposition: A | Discharge status: Home / Self Care | Payer: Medicare HMO | Source: Ambulatory Visit | Attending: Neurosurgery | Admitting: Neurosurgery

## 2020-06-04 ENCOUNTER — Inpatient Hospital Stay (HOSPITAL_COMMUNITY)
Admission: RE | Admit: 2020-06-04 | Discharge: 2020-06-05 | DRG: 027 | Disposition: A | Discharge status: Home / Self Care | Payer: Medicare HMO | Attending: Neurosurgery | Admitting: Neurosurgery

## 2020-06-04 ENCOUNTER — Other Ambulatory Visit: Payer: Self-pay

## 2020-06-04 ENCOUNTER — Inpatient Hospital Stay (HOSPITAL_COMMUNITY): Payer: Medicare HMO | Admitting: Certified Registered Nurse Anesthetist

## 2020-06-04 DIAGNOSIS — I609 Nontraumatic subarachnoid hemorrhage, unspecified: Secondary | ICD-10-CM

## 2020-06-04 DIAGNOSIS — I10 Essential (primary) hypertension: Secondary | ICD-10-CM | POA: Diagnosis present

## 2020-06-04 DIAGNOSIS — Z87891 Personal history of nicotine dependence: Secondary | ICD-10-CM | POA: Diagnosis not present

## 2020-06-04 DIAGNOSIS — Z79899 Other long term (current) drug therapy: Secondary | ICD-10-CM

## 2020-06-04 DIAGNOSIS — Z7951 Long term (current) use of inhaled steroids: Secondary | ICD-10-CM | POA: Diagnosis not present

## 2020-06-04 DIAGNOSIS — D62 Acute posthemorrhagic anemia: Secondary | ICD-10-CM | POA: Diagnosis not present

## 2020-06-04 DIAGNOSIS — E876 Hypokalemia: Secondary | ICD-10-CM | POA: Insufficient documentation

## 2020-06-04 DIAGNOSIS — I671 Cerebral aneurysm, nonruptured: Secondary | ICD-10-CM | POA: Diagnosis not present

## 2020-06-04 DIAGNOSIS — E785 Hyperlipidemia, unspecified: Secondary | ICD-10-CM | POA: Diagnosis not present

## 2020-06-04 DIAGNOSIS — H8103 Meniere's disease, bilateral: Secondary | ICD-10-CM | POA: Diagnosis not present

## 2020-06-04 DIAGNOSIS — J449 Chronic obstructive pulmonary disease, unspecified: Secondary | ICD-10-CM | POA: Diagnosis present

## 2020-06-04 DIAGNOSIS — Z20822 Contact with and (suspected) exposure to covid-19: Secondary | ICD-10-CM | POA: Diagnosis present

## 2020-06-04 DIAGNOSIS — I607 Nontraumatic subarachnoid hemorrhage from unspecified intracranial artery: Secondary | ICD-10-CM | POA: Diagnosis not present

## 2020-06-04 DIAGNOSIS — R001 Bradycardia, unspecified: Secondary | ICD-10-CM | POA: Diagnosis not present

## 2020-06-04 DIAGNOSIS — I714 Abdominal aortic aneurysm, without rupture: Secondary | ICD-10-CM | POA: Insufficient documentation

## 2020-06-04 DIAGNOSIS — I959 Hypotension, unspecified: Secondary | ICD-10-CM | POA: Diagnosis not present

## 2020-06-04 DIAGNOSIS — K219 Gastro-esophageal reflux disease without esophagitis: Secondary | ICD-10-CM | POA: Diagnosis present

## 2020-06-04 HISTORY — PX: IR ANGIO INTRA EXTRACRAN SEL INTERNAL CAROTID UNI R MOD SED: IMG5362

## 2020-06-04 HISTORY — PX: IR TRANSCATH/EMBOLIZ: IMG695

## 2020-06-04 HISTORY — PX: IR NEURO EACH ADD'L AFTER BASIC UNI RIGHT (MS): IMG5374

## 2020-06-04 HISTORY — PX: IR US GUIDE VASC ACCESS RIGHT: IMG2390

## 2020-06-04 HISTORY — PX: RADIOLOGY WITH ANESTHESIA: SHX6223

## 2020-06-04 HISTORY — PX: IR ANGIOGRAM FOLLOW UP STUDY: IMG697

## 2020-06-04 LAB — POCT ACTIVATED CLOTTING TIME: Activated Clotting Time: 224 seconds

## 2020-06-04 SURGERY — IR WITH ANESTHESIA
Anesthesia: General

## 2020-06-04 MED ORDER — VITAMIN D 25 MCG (1000 UNIT) PO TABS
2000.0000 [IU] | ORAL_TABLET | Freq: Every day | ORAL | Status: DC
  Administered 2020-06-04: 2000 [IU] via ORAL
  Filled 2020-06-04: qty 2

## 2020-06-04 MED ORDER — CHLORHEXIDINE GLUCONATE CLOTH 2 % EX PADS: 6.0000 | MEDICATED_PAD | Freq: Every day | CUTANEOUS | Status: DC

## 2020-06-04 MED ORDER — LIDOCAINE HCL 1 % IJ SOLN
INTRAMUSCULAR | Status: AC
  Filled 2020-06-04: qty 20

## 2020-06-04 MED ORDER — FENTANYL CITRATE (PF) 100 MCG/2ML IJ SOLN
INTRAMUSCULAR | Status: AC
  Filled 2020-06-04: qty 2

## 2020-06-04 MED ORDER — AMLODIPINE BESYLATE 2.5 MG PO TABS
2.5000 mg | ORAL_TABLET | Freq: Every day | ORAL | Status: DC
  Administered 2020-06-05: 2.5 mg via ORAL
  Filled 2020-06-04 (×2): qty 1

## 2020-06-04 MED ORDER — ONDANSETRON HCL 4 MG PO TABS: 4.0000 mg | ORAL_TABLET | ORAL | Status: DC | PRN

## 2020-06-04 MED ORDER — PROTAMINE SULFATE 10 MG/ML IV SOLN
INTRAVENOUS | Status: DC | PRN
Start: 2020-06-04 — End: 2020-06-04
  Administered 2020-06-04: 10 mg via INTRAVENOUS

## 2020-06-04 MED ORDER — CHLORHEXIDINE GLUCONATE CLOTH 2 % EX PADS: 6.0000 | MEDICATED_PAD | Freq: Once | CUTANEOUS | Status: DC

## 2020-06-04 MED ORDER — ACETAMINOPHEN 325 MG PO TABS: 650.0000 mg | ORAL_TABLET | Freq: Four times a day (QID) | ORAL | Status: DC | PRN

## 2020-06-04 MED ORDER — MIDAZOLAM HCL 2 MG/2ML IJ SOLN
INTRAMUSCULAR | Status: AC
  Filled 2020-06-04: qty 2

## 2020-06-04 MED ORDER — DEXAMETHASONE SODIUM PHOSPHATE 10 MG/ML IJ SOLN
INTRAMUSCULAR | Status: DC | PRN
  Administered 2020-06-04: 5 mg via INTRAVENOUS

## 2020-06-04 MED ORDER — CALCIUM CARBONATE 1250 (500 CA) MG PO TABS
1250.0000 mg | ORAL_TABLET | Freq: Every day | ORAL | Status: DC
  Administered 2020-06-04: 1250 mg via ORAL
  Filled 2020-06-04 (×2): qty 1

## 2020-06-04 MED ORDER — IOHEXOL 300 MG/ML  SOLN
150.0000 mL | Freq: Once | INTRAMUSCULAR | Status: AC | PRN
  Administered 2020-06-04: 65 mL via INTRA_ARTERIAL

## 2020-06-04 MED ORDER — TOPIRAMATE 25 MG PO TABS
25.0000 mg | ORAL_TABLET | Freq: Every day | ORAL | Status: DC
  Administered 2020-06-04: 25 mg via ORAL
  Filled 2020-06-04: qty 1

## 2020-06-04 MED ORDER — PROPOFOL 10 MG/ML IV BOLUS
INTRAVENOUS | Status: DC | PRN
  Administered 2020-06-04: 110 mg via INTRAVENOUS

## 2020-06-04 MED ORDER — PHENYLEPHRINE HCL-NACL 10-0.9 MG/250ML-% IV SOLN
INTRAVENOUS | Status: DC | PRN
Start: 2020-06-04 — End: 2020-06-04
  Administered 2020-06-04: 20 ug/min via INTRAVENOUS

## 2020-06-04 MED ORDER — CHLORHEXIDINE GLUCONATE 0.12 % MT SOLN
15.0000 mL | Freq: Once | OROMUCOSAL | Status: AC
  Administered 2020-06-04: 15 mL via OROMUCOSAL
  Filled 2020-06-04: qty 15

## 2020-06-04 MED ORDER — LORATADINE 10 MG PO TABS
10.0000 mg | ORAL_TABLET | Freq: Every day | ORAL | Status: DC
  Administered 2020-06-04: 10 mg via ORAL
  Filled 2020-06-04: qty 1

## 2020-06-04 MED ORDER — MORPHINE SULFATE (PF) 2 MG/ML IV SOLN: 1.0000 mg | INTRAVENOUS | Status: DC | PRN

## 2020-06-04 MED ORDER — PANTOPRAZOLE SODIUM 40 MG PO TBEC
40.0000 mg | DELAYED_RELEASE_TABLET | Freq: Every day | ORAL | Status: DC
  Administered 2020-06-05: 40 mg via ORAL
  Filled 2020-06-04 (×2): qty 1

## 2020-06-04 MED ORDER — ONDANSETRON HCL 4 MG/2ML IJ SOLN: 4.0000 mg | INTRAMUSCULAR | Status: DC | PRN

## 2020-06-04 MED ORDER — HEPARIN SODIUM (PORCINE) 1000 UNIT/ML IJ SOLN
INTRAMUSCULAR | Status: DC | PRN
Start: 2020-06-04 — End: 2020-06-04
  Administered 2020-06-04: 5000 [IU] via INTRAVENOUS

## 2020-06-04 MED ORDER — SUGAMMADEX SODIUM 200 MG/2ML IV SOLN
INTRAVENOUS | Status: DC | PRN
  Administered 2020-06-04: 200 mg via INTRAVENOUS

## 2020-06-04 MED ORDER — CEFAZOLIN SODIUM-DEXTROSE 2-4 GM/100ML-% IV SOLN
2.0000 g | INTRAVENOUS | Status: AC
  Administered 2020-06-04: 2 g via INTRAVENOUS
  Filled 2020-06-04: qty 100

## 2020-06-04 MED ORDER — FLUTICASONE PROPIONATE 50 MCG/ACT NA SUSP
2.0000 | Freq: Every day | NASAL | Status: DC
  Filled 2020-06-04: qty 16

## 2020-06-04 MED ORDER — LACTATED RINGERS IV SOLN: INTRAVENOUS | Status: DC

## 2020-06-04 MED ORDER — LIDOCAINE 2% (20 MG/ML) 5 ML SYRINGE
INTRAMUSCULAR | Status: DC | PRN
  Administered 2020-06-04 (×2): 50 mg via INTRAVENOUS

## 2020-06-04 MED ORDER — EPHEDRINE SULFATE-NACL 50-0.9 MG/10ML-% IV SOSY
PREFILLED_SYRINGE | INTRAVENOUS | Status: DC | PRN
  Administered 2020-06-04: 5 mg via INTRAVENOUS
  Administered 2020-06-04: 10 mg via INTRAVENOUS
  Administered 2020-06-04: 20 mg via INTRAVENOUS
  Administered 2020-06-04: 15 mg via INTRAVENOUS

## 2020-06-04 MED ORDER — FENTANYL CITRATE (PF) 100 MCG/2ML IJ SOLN
INTRAMUSCULAR | Status: DC | PRN
  Administered 2020-06-04: 100 ug via INTRAVENOUS
  Administered 2020-06-04 (×2): 50 ug via INTRAVENOUS

## 2020-06-04 MED ORDER — FLUTICASONE FUROATE-VILANTEROL 100-25 MCG/INH IN AEPB
1.0000 | INHALATION_SPRAY | Freq: Every day | RESPIRATORY_TRACT | Status: DC
  Filled 2020-06-04: qty 28

## 2020-06-04 MED ORDER — ONDANSETRON HCL 4 MG/2ML IJ SOLN
INTRAMUSCULAR | Status: DC | PRN
  Administered 2020-06-04: 4 mg via INTRAVENOUS

## 2020-06-04 MED ORDER — SODIUM CHLORIDE 0.9 % IV SOLN: INTRAVENOUS | Status: DC

## 2020-06-04 MED ORDER — POTASSIUM CHLORIDE CRYS ER 20 MEQ PO TBCR
30.0000 meq | EXTENDED_RELEASE_TABLET | Freq: Two times a day (BID) | ORAL | Status: DC
  Administered 2020-06-04 – 2020-06-05 (×2): 30 meq via ORAL
  Filled 2020-06-04 (×3): qty 1

## 2020-06-04 MED ORDER — HYDROCODONE-ACETAMINOPHEN 5-325 MG PO TABS: 1.0000 | ORAL_TABLET | ORAL | Status: DC | PRN

## 2020-06-04 MED ORDER — SODIUM CHLORIDE 0.9 % IV SOLN
INTRAVENOUS | Status: DC | PRN
Start: 2020-06-04 — End: 2020-06-04

## 2020-06-04 MED ORDER — ROCURONIUM BROMIDE 10 MG/ML (PF) SYRINGE
PREFILLED_SYRINGE | INTRAVENOUS | Status: DC | PRN
  Administered 2020-06-04: 20 mg via INTRAVENOUS
  Administered 2020-06-04: 60 mg via INTRAVENOUS

## 2020-06-04 MED ORDER — ORAL CARE MOUTH RINSE: 15.0000 mL | Freq: Once | OROMUCOSAL | Status: AC

## 2020-06-04 MED ORDER — LABETALOL HCL 5 MG/ML IV SOLN: 10.0000 mg | INTRAVENOUS | Status: DC | PRN

## 2020-06-04 NOTE — Anesthesia Postprocedure Evaluation (Signed)
Anesthesia Post Note  Patient: Autumn Le  Procedure(s) Performed: Coil embolization of left ACA aneurysm (N/A )     Patient location during evaluation: PACU Anesthesia Type: General Level of consciousness: awake and alert Pain management: pain level controlled Vital Signs Assessment: post-procedure vital signs reviewed and stable Respiratory status: spontaneous breathing, nonlabored ventilation, respiratory function stable and patient connected to nasal cannula oxygen Cardiovascular status: blood pressure returned to baseline and stable Postop Assessment: no apparent nausea or vomiting Anesthetic complications: no   No complications documented.  Last Vitals:  Vitals:   06/04/20 1324 06/04/20 1330  BP: 110/73   Pulse: 72 69  Resp: 19 15  Temp: 36.6 C   SpO2: 99% 97%    Last Pain:  Vitals:   06/04/20 1324  TempSrc: Oral  PainSc: 0-No pain                 Malaisha Silliman DAVID

## 2020-06-04 NOTE — Transfer of Care (Signed)
Immediate Anesthesia Transfer of Care Note  Patient: Autumn Le  Procedure(s) Performed: Coil embolization of left ACA aneurysm (N/A )  Patient Location: PACU  Anesthesia Type:General  Level of Consciousness: awake, alert  and patient cooperative  Airway & Oxygen Therapy: Patient Spontanous Breathing  Post-op Assessment: Report given to RN and Post -op Vital signs reviewed and stable  Post vital signs: Reviewed and stable  Last Vitals:  Vitals Value Taken Time  BP 126/57 06/04/20 1138  Temp    Pulse 80 06/04/20 1142  Resp 17 06/04/20 1142  SpO2 91 % 06/04/20 1142  Vitals shown include unvalidated device data.  Last Pain:  Vitals:   06/04/20 0728  TempSrc:   PainSc: 0-No pain         Complications: No complications documented.

## 2020-06-04 NOTE — Anesthesia Procedure Notes (Signed)
Arterial Line Insertion Start/End7/23/2021 8:30 AM, 06/04/2020 8:45 AM Performed by: Arta Bruce, MD, anesthesiologist  Preanesthetic checklist: patient identified, IV checked, risks and benefits discussed, surgical consent, monitors and equipment checked, pre-op evaluation, timeout performed and anesthesia consent Lidocaine 1% used for infiltration and patient sedated Right, brachial was placed Catheter size: 20 G Hand hygiene performed  and Seldinger technique used  Attempts: 1 Procedure performed without using ultrasound guided technique. Following insertion, line sutured. Post procedure assessment: normal  Patient tolerated the procedure well with no immediate complications.

## 2020-06-04 NOTE — H&P (Signed)
Chief Complaint   Aneurysm   HPI   HPI: Autumn Le is a 74 y.o. female with history of subarachnoid hemorrhage secondary to ruptured right posterior communicating artery aneurysm, s/p coiling, back in March 2021 who was also found to harbor an unruptured distal ACA aneurysm. She has made an excellent recovery. Given her history, it was recommended we treat the ACA aneurysm via coil embolization. She presents today for surgery. She is without any concerns.  Patient Active Problem List   Diagnosis Date Noted  . AAA (abdominal aortic aneurysm) without rupture (HCC)   . Acute blood loss anemia   . Hypokalemia   . Leukocytosis   . Essential hypertension   . Agitation   . Ruptured cerebral aneurysm (HCC) 01/31/2020  . SAH (subarachnoid hemorrhage) (HCC) 01/23/2020    PMH: Past Medical History:  Diagnosis Date  . AAA (abdominal aortic aneurysm) (HCC)    4.6 cm AAA 01/2020, 6 month f/u rec (Vascular surgeon: Fabienne Bruns, MD)  . COPD (chronic obstructive pulmonary disease) (HCC)    patient denies - states she has no issues  . Degenerative joint disease of cervical spine   . GERD (gastroesophageal reflux disease)   . HLD (hyperlipidemia)   . Hypertension   . Meniere's disease in remission, bilateral   . Osteopenia   . Subarachnoid hemorrhage (HCC)   . Vertigo     PSH: Past Surgical History:  Procedure Laterality Date  . CATARACT EXTRACTION W/ INTRAOCULAR LENS  IMPLANT, BILATERAL    . EYE SURGERY    . IR ANGIO INTRA EXTRACRAN SEL INTERNAL CAROTID BILAT MOD SED  01/23/2020  . IR ANGIO VERTEBRAL SEL VERTEBRAL UNI L MOD SED  01/23/2020  . IR ANGIOGRAM FOLLOW UP STUDY  01/23/2020  . IR ANGIOGRAM FOLLOW UP STUDY  01/23/2020  . IR ANGIOGRAM FOLLOW UP STUDY  01/23/2020  . IR ANGIOGRAM FOLLOW UP STUDY  01/23/2020  . IR TRANSCATH/EMBOLIZ  01/23/2020  . RADIOLOGY WITH ANESTHESIA N/A 01/23/2020   Procedure: RADIOLOGY WITH ANESTHESIA;  Surgeon: Lisbeth Renshaw, MD;  Location: [facility] OR;   Service: Radiology;  Laterality: N/A;  . TONSILLECTOMY      Medications Prior to Admission  Medication Sig Dispense Refill Last Dose  . amLODipine (NORVASC) 2.5 MG tablet Take 1 tablet (2.5 mg total) by mouth daily. 30 tablet 0 06/04/2020 at 0430  . Calcium Carbonate (CALCIUM 600 PO) Take 600 mg by mouth at bedtime.   Past Week at Unknown time  . Cholecalciferol 50 MCG (2000 UT) CAPS Take 2,000 Units by mouth at bedtime.    Past Week at Unknown time  . fluticasone (FLONASE) 50 MCG/ACT nasal spray Place 2 sprays into both nostrils daily.    Past Month at Unknown time  . fluticasone furoate-vilanterol (BREO ELLIPTA) 100-25 MCG/INH AEPB Inhale 1 puff into the lungs daily.   06/04/2020 at 0430  . loratadine (CLARITIN) 10 MG tablet Take 10 mg by mouth at bedtime.    Past Week at Unknown time  . pantoprazole (PROTONIX) 40 MG tablet Take 1 tablet (40 mg total) by mouth daily. 30 tablet 0 06/04/2020 at 0430  . potassium chloride (KLOR-CON M15) 15 MEQ tablet Take 2 tablets (30 mEq total) by mouth 2 (two) times daily.   Past Week at Unknown time  . topiramate (TOPAMAX) 25 MG tablet Take 1 tablet (25 mg total) by mouth at bedtime. 30 tablet 0 06/03/2020 at Unknown time  . acetaminophen (TYLENOL) 325 MG tablet Take 650 mg by  mouth every 6 (six) hours as needed for headache (pain).   More than a month at Unknown time  . niMODipine (NIMOTOP) 30 MG capsule Take 2 capsules (60 mg total) by mouth every 4 (four) hours. Thorough 02/13/19 (Patient not taking: Reported on 05/26/2020) 36 capsule 0 Not Taking at Unknown time  . traMADol (ULTRAM) 50 MG tablet Take 1 tablet (50 mg total) by mouth every 6 (six) hours as needed for moderate pain. 15 tablet 0 More than a month at Unknown time    SH: Social History   Tobacco Use  . Smoking status: Former Smoker    Quit date: 01/23/2020    Years since quitting: 0.3  . Smokeless tobacco: Never Used  Vaping Use  . Vaping Use: Never used  Substance Use Topics  . Alcohol  use: Yes    Comment: occasional glass of wine   . Drug use: Not Currently    MEDS: Prior to Admission medications   Medication Sig Start Date End Date Taking? Authorizing Provider  acetaminophen (TYLENOL) 325 MG tablet Take 650 mg by mouth every 6 (six) hours as needed for headache (pain).    [provider]  amLODipine (NORVASC) 2.5 MG tablet Take 1 tablet (2.5 mg total) by mouth daily. 02/12/20   Angiulli, Mcarthur Rossetti, PA-C  Calcium Carbonate (CALCIUM 600 PO) Take 600 mg by mouth at bedtime.    [provider]  Cholecalciferol 50 MCG (2000 UT) CAPS Take 2,000 Units by mouth at bedtime.     [provider]  fluticasone (FLONASE) 50 MCG/ACT nasal spray Place 2 sprays into both nostrils daily.  09/09/16   [provider]  fluticasone furoate-vilanterol (BREO ELLIPTA) 100-25 MCG/INH AEPB Inhale 1 puff into the lungs daily.    [provider]  loratadine (CLARITIN) 10 MG tablet Take 10 mg by mouth at bedtime.  08/12/16   [provider]  niMODipine (NIMOTOP) 30 MG capsule Take 2 capsules (60 mg total) by mouth every 4 (four) hours. Thorough 02/13/19 Patient not taking: Reported on 05/26/2020 02/10/20   Love, Evlyn Kanner, PA-C  pantoprazole (PROTONIX) 40 MG tablet Take 1 tablet (40 mg total) by mouth daily. 02/12/20   Angiulli, Mcarthur Rossetti, PA-C  potassium chloride (KLOR-CON M15) 15 MEQ tablet Take 2 tablets (30 mEq total) by mouth 2 (two) times daily. 02/09/20   Love, Evlyn Kanner, PA-C  topiramate (TOPAMAX) 25 MG tablet Take 1 tablet (25 mg total) by mouth at bedtime. 02/12/20   Angiulli, Mcarthur Rossetti, PA-C  traMADol (ULTRAM) 50 MG tablet Take 1 tablet (50 mg total) by mouth every 6 (six) hours as needed for moderate pain. 02/12/20   Angiulli, Mcarthur Rossetti, PA-C    ALLERGY: Allergies  Allergen Reactions  . Latex Hives    Social History   Tobacco Use  . Smoking status: Former Smoker    Quit date: 01/23/2020    Years since quitting: 0.3  . Smokeless tobacco: Never  Used  Substance Use Topics  . Alcohol use: Yes    Comment: occasional glass of wine      Family History  Problem Relation Age of Onset  . Breast cancer Maternal Aunt      ROS   ROS  Exam   Vitals:   06/04/20 0648  BP: (!) 125/56  Pulse: 65  Resp: 18  Temp: 98.1 F (36.7 C)  SpO2: 98%   General appearance: WDWN, NAD Eyes: No scleral injection Cardiovascular: Regular rate and rhythm without murmurs, rubs, gallops.  No edema or variciosities. Distal pulses normal. Pulmonary: Effort normal, non-labored breathing Musculoskeletal:     Muscle tone upper extremities: Normal    Muscle tone lower extremities: Normal    Motor exam: Upper Extremities Deltoid Bicep Tricep Grip  Right 5/5 5/5 5/5 5/5  Left 5/5 5/5 5/5 5/5   Lower Extremity IP Quad PF DF EHL  Right 5/5 5/5 5/5 5/5 5/5  Left 5/5 5/5 5/5 5/5 5/5   Neurological Mental Status:    - Patient is awake, alert, oriented to person, place, month, year, and situation    - Patient is able to give a clear and coherent history.    - No signs of aphasia or neglect Cranial Nerves    - II: Visual Fields are full. PERRL    - III/IV/VI: EOMI without ptosis or diploplia.     - V: Facial sensation is grossly normal    - VII: Facial movement is symmetric.     - VIII: hearing is intact to voice    - X: Uvula elevates symmetrically    - XI: Shoulder shrug is symmetric.    - XII: tongue is midline without atrophy or fasciculations.  Sensory: Sensation grossly intact to LT  Results - Imaging/Labs   No results found for this or any previous visit (from the past 48 hour(s)).  No results found.  IMAGING: Diagnostic cerebral angiogram dated 01/23/2020 was again reviewed. In addition to the coiled right posterior communicating artery aneurysm, there is an approximately 3-32mm right A2 A3 junction aneurysm, and a left sided cavernous aneurysm.   Impression/Plan   74 y.o. female approximately 4 months status post subarachnoid  hemorrhage with coiling of ruptured posterior communicating artery aneurysm with also harbors an unruptured distal ACA aneurysm as well as a left cavernous aneurysm. Given her history of prior rupture, I have recommended we treat the unruptured ACA aneurysm. We will proceed with endovascular coil embolization of the right distal ACA aneurysm.  We have reviewed the indications for surgery, the associated risks, benefits and alternatives at length in the office.  All questions today were answered and consent was obtained.  Lisbeth Renshaw, MD [facility]

## 2020-06-04 NOTE — Brief Op Note (Signed)
  NEUROSURGERY BRIEF OPERATIVE  NOTE   PREOP DX: Right Anterior Cerebral artery aneurysm  POSTOP DX: Same  PROCEDURE: Diagnostic cerebral angiogram  SURGEON: Dr. Lisbeth Renshaw, MD  ANESTHESIA: GETA  EBL: Minimal  SPECIMENS: None  COMPLICATIONS: None  CONDITION: Stable to recovery  FINDINGS (Full report in [facility]): 1. Successful coil embolization of right distal ACA aneurysm 2. Continued occlusion of previously coiled right Pcom aneurysm

## 2020-06-04 NOTE — Anesthesia Procedure Notes (Signed)
Procedure Name: Intubation Date/Time: 06/04/2020 9:18 AM Performed by: Janace Litten, CRNA Pre-anesthesia Checklist: Patient identified, Emergency Drugs available, Suction available and Patient being monitored Patient Re-evaluated:Patient Re-evaluated prior to induction Oxygen Delivery Method: Circle system utilized Preoxygenation: Pre-oxygenation with 100% oxygen Induction Type: IV induction Ventilation: Mask ventilation without difficulty Laryngoscope Size: Mac and 3 Grade View: Grade I Tube type: Oral Tube size: 7.0 mm Number of attempts: 1 Placement Confirmation: ETT inserted through vocal cords under direct vision,  positive ETCO2 and breath sounds checked- equal and bilateral Secured at: 20 cm Tube secured with: Tape Dental Injury: Teeth and Oropharynx as per pre-operative assessment

## 2020-06-04 NOTE — Plan of Care (Signed)
  Problem: Clinical Measurements: Goal: Ability to maintain clinical measurements within normal limits will improve Outcome: Progressing   

## 2020-06-04 NOTE — H&P (Signed)
Chief Complaint   Aneurysm   HPI   HPI: Autumn Le is a 74 y.o. female with history of subarachnoid hemorrhage secondary to ruptured right posterior communicating artery aneurysm, s/p coiling, back in March 2021 who was also found to harbor an unruptured distal ACA aneurysm. She has made an excellent recovery. Given her history, it was recommended we treat the ACA aneurysm via coil embolization. She presents today for surgery. She is without any concerns.  Patient Active Problem List   Diagnosis Date Noted  . AAA (abdominal aortic aneurysm) without rupture (HCC)   . Acute blood loss anemia   . Hypokalemia   . Leukocytosis   . Essential hypertension   . Agitation   . Ruptured cerebral aneurysm (HCC) 01/31/2020  . SAH (subarachnoid hemorrhage) (HCC) 01/23/2020    PMH: Past Medical History:  Diagnosis Date  . AAA (abdominal aortic aneurysm) (HCC)    4.6 cm AAA 01/2020, 6 month f/u rec (Vascular surgeon: Fabienne Bruns, MD)  . COPD (chronic obstructive pulmonary disease) (HCC)    patient denies - states she has no issues  . Degenerative joint disease of cervical spine   . GERD (gastroesophageal reflux disease)   . HLD (hyperlipidemia)   . Hypertension   . Meniere's disease in remission, bilateral   . Osteopenia   . Subarachnoid hemorrhage (HCC)   . Vertigo     PSH: Past Surgical History:  Procedure Laterality Date  . CATARACT EXTRACTION W/ INTRAOCULAR LENS  IMPLANT, BILATERAL    . EYE SURGERY    . IR ANGIO INTRA EXTRACRAN SEL INTERNAL CAROTID BILAT MOD SED  01/23/2020  . IR ANGIO VERTEBRAL SEL VERTEBRAL UNI L MOD SED  01/23/2020  . IR ANGIOGRAM FOLLOW UP STUDY  01/23/2020  . IR ANGIOGRAM FOLLOW UP STUDY  01/23/2020  . IR ANGIOGRAM FOLLOW UP STUDY  01/23/2020  . IR ANGIOGRAM FOLLOW UP STUDY  01/23/2020  . IR TRANSCATH/EMBOLIZ  01/23/2020  . RADIOLOGY WITH ANESTHESIA N/A 01/23/2020   Procedure: RADIOLOGY WITH ANESTHESIA;  Surgeon: Lisbeth Renshaw, MD;  Location: [facility] OR;   Service: Radiology;  Laterality: N/A;  . TONSILLECTOMY      (Not in a hospital admission)   SH: Social History   Tobacco Use  . Smoking status: Former Smoker    Quit date: 01/23/2020    Years since quitting: 0.3  . Smokeless tobacco: Never Used  Vaping Use  . Vaping Use: Never used  Substance Use Topics  . Alcohol use: Yes    Comment: occasional glass of wine   . Drug use: Not Currently    MEDS: Prior to Admission medications   Medication Sig Start Date End Date Taking? Authorizing Provider  acetaminophen (TYLENOL) 325 MG tablet Take 650 mg by mouth every 6 (six) hours as needed for headache (pain).    [provider]  amLODipine (NORVASC) 2.5 MG tablet Take 1 tablet (2.5 mg total) by mouth daily. 02/12/20   Angiulli, Mcarthur Rossetti, PA-C  Calcium Carbonate (CALCIUM 600 PO) Take 600 mg by mouth at bedtime.    [provider]  Cholecalciferol 50 MCG (2000 UT) CAPS Take 2,000 Units by mouth at bedtime.     [provider]  fluticasone (FLONASE) 50 MCG/ACT nasal spray Place 2 sprays into both nostrils daily.  09/09/16   [provider]  fluticasone furoate-vilanterol (BREO ELLIPTA) 100-25 MCG/INH AEPB Inhale 1 puff into the lungs daily.    [provider]  loratadine (CLARITIN) 10 MG tablet Take  10 mg by mouth at bedtime.  08/12/16   [provider]  niMODipine (NIMOTOP) 30 MG capsule Take 2 capsules (60 mg total) by mouth every 4 (four) hours. Thorough 02/13/19 Patient not taking: Reported on 05/26/2020 02/10/20   Love, Evlyn Kanner, PA-C  pantoprazole (PROTONIX) 40 MG tablet Take 1 tablet (40 mg total) by mouth daily. 02/12/20   Angiulli, Mcarthur Rossetti, PA-C  potassium chloride (KLOR-CON M15) 15 MEQ tablet Take 2 tablets (30 mEq total) by mouth 2 (two) times daily. 02/09/20   Love, Evlyn Kanner, PA-C  topiramate (TOPAMAX) 25 MG tablet Take 1 tablet (25 mg total) by mouth at bedtime. 02/12/20   Angiulli, Mcarthur Rossetti, PA-C  traMADol (ULTRAM) 50 MG tablet Take 1  tablet (50 mg total) by mouth every 6 (six) hours as needed for moderate pain. 02/12/20   Angiulli, Mcarthur Rossetti, PA-C    ALLERGY: Allergies  Allergen Reactions  . Latex Hives    Social History   Tobacco Use  . Smoking status: Former Smoker    Quit date: 01/23/2020    Years since quitting: 0.3  . Smokeless tobacco: Never Used  Substance Use Topics  . Alcohol use: Yes    Comment: occasional glass of wine      Family History  Problem Relation Age of Onset  . Breast cancer Maternal Aunt      ROS   ROS  Exam   There were no vitals filed for this visit. General appearance: WDWN, NAD Eyes: No scleral injection Cardiovascular: Regular rate and rhythm without murmurs, rubs, gallops. No edema or variciosities. Distal pulses normal. Pulmonary: Effort normal, non-labored breathing Musculoskeletal:     Muscle tone upper extremities: Normal    Muscle tone lower extremities: Normal    Motor exam: Upper Extremities Deltoid Bicep Tricep Grip  Right 5/5 5/5 5/5 5/5  Left 5/5 5/5 5/5 5/5   Lower Extremity IP Quad PF DF EHL  Right 5/5 5/5 5/5 5/5 5/5  Left 5/5 5/5 5/5 5/5 5/5   Neurological Mental Status:    - Patient is awake, alert, oriented to person, place, month, year, and situation    - Patient is able to give a clear and coherent history.    - No signs of aphasia or neglect Cranial Nerves    - II: Visual Fields are full. PERRL    - III/IV/VI: EOMI without ptosis or diploplia.     - V: Facial sensation is grossly normal    - VII: Facial movement is symmetric.     - VIII: hearing is intact to voice    - X: Uvula elevates symmetrically    - XI: Shoulder shrug is symmetric.    - XII: tongue is midline without atrophy or fasciculations.  Sensory: Sensation grossly intact to LT  Results - Imaging/Labs   No results found for this or any previous visit (from the past 48 hour(s)).  No results found.  IMAGING: Diagnostic cerebral angiogram dated 01/23/2020 was again  reviewed. In addition to the coiled right posterior communicating artery aneurysm, there is an approximately 3-61mm right A2 A3 junction aneurysm, and a left sided cavernous aneurysm.   Impression/Plan   74 y.o. female approximately 4 months status post subarachnoid hemorrhage with coiling of ruptured posterior communicating artery aneurysm with also harbors an unruptured distal ACA aneurysm as well as a left cavernous aneurysm. Given her history of prior rupture, I have recommended we treat the unruptured ACA aneurysm. We will proceed with endovascular coil embolization  of the right distal ACA aneurysm.  We have reviewed the indications for surgery, the associated risks, benefits and alternatives at length in the office.  All questions today were answered and consent was obtained.  Lisbeth Renshaw, MD [facility]

## 2020-06-05 NOTE — Care Management (Signed)
Patient does not need C44 per Yvette Rack, UR RN. She had prior auth for inpatient stay.

## 2020-06-05 NOTE — Discharge Summary (Signed)
Physician Discharge Summary  Patient ID: Autumn Le MRN: [id number] DOB/AGE: 07/04/1946 74 y.o.  Admit date: 06/04/2020 Discharge date: 06/05/2020  Admission Diagnoses:right anterior cerebral aneurysm  Discharge Diagnoses:  Active Problems:   Cerebral aneurysm, nonruptured   Discharged Condition: good  Hospital Course: Autumn Le was admitted and taken to the angio suite and had her unruptured aneurysm treated via endovascular coiling. Post op she is neurologically normal. Her puncture site is clean, dry, and flat. She is ambulating, and tolerating a regular diet.   Treatments: surgery: as above  Discharge Exam: Blood pressure (!) 137/62, pulse 56, temperature 97.9 F (36.6 C), temperature source Oral, resp. rate 19, height 5\' 4"  (1.626 m), weight 51.3 kg, SpO2 98 %. General appearance: alert, appears stated age and no distress  Disposition: Discharge disposition: 01-Home or Self Care      I60.9 Subarachnoid hemorrhage  Allergies as of 06/05/2020      Reactions   Latex Hives      Medication List    TAKE these medications   acetaminophen 325 MG tablet Commonly known as: TYLENOL Take 650 mg by mouth every 6 (six) hours as needed for headache (pain).   amLODipine 2.5 MG tablet Commonly known as: NORVASC Take 1 tablet (2.5 mg total) by mouth daily.   Breo Ellipta 100-25 MCG/INH Aepb Generic drug: fluticasone furoate-vilanterol Inhale 1 puff into the lungs daily.   CALCIUM 600 PO Take 600 mg by mouth at bedtime.   Cholecalciferol 50 MCG (2000 UT) Caps Take 2,000 Units by mouth at bedtime.   fluticasone 50 MCG/ACT nasal spray Commonly known as: FLONASE Place 2 sprays into both nostrils daily.   loratadine 10 MG tablet Commonly known as: CLARITIN Take 10 mg by mouth at bedtime.   niMODipine 30 MG capsule Commonly known as: NIMOTOP Take 2 capsules (60 mg total) by mouth every 4 (four) hours. Thorough 02/13/19   pantoprazole 40 MG tablet Commonly known  as: PROTONIX Take 1 tablet (40 mg total) by mouth daily.   potassium chloride SA 15 MEQ tablet Commonly known as: KLOR-CON M15 Take 2 tablets (30 mEq total) by mouth 2 (two) times daily.   topiramate 25 MG tablet Commonly known as: TOPAMAX Take 1 tablet (25 mg total) by mouth at bedtime.   traMADol 50 MG tablet Commonly known as: ULTRAM Take 1 tablet (50 mg total) by mouth every 6 (six) hours as needed for moderate pain.       Follow-up Information    04/15/19, MD. Call in 2 week(s).   Specialty: Neurosurgery Why: please call to make a follow up appointment Contact information: [address] [place] [phone]               Signed: [phone] 06/05/2020, 9:49 AM

## 2020-06-06 ENCOUNTER — Other Ambulatory Visit: Payer: Self-pay

## 2020-06-06 ENCOUNTER — Emergency Department (HOSPITAL_COMMUNITY): Payer: Medicare HMO

## 2020-06-06 ENCOUNTER — Emergency Department (HOSPITAL_COMMUNITY)
Admission: EM | Admit: 2020-06-06 | Discharge: 2020-06-06 | Disposition: A | Discharge status: Home / Self Care | Payer: Medicare HMO | Attending: Emergency Medicine | Admitting: Emergency Medicine

## 2020-06-06 DIAGNOSIS — Z79899 Other long term (current) drug therapy: Secondary | ICD-10-CM | POA: Insufficient documentation

## 2020-06-06 DIAGNOSIS — R404 Transient alteration of awareness: Secondary | ICD-10-CM | POA: Diagnosis not present

## 2020-06-06 DIAGNOSIS — R531 Weakness: Secondary | ICD-10-CM | POA: Diagnosis not present

## 2020-06-06 DIAGNOSIS — G9340 Encephalopathy, unspecified: Secondary | ICD-10-CM | POA: Diagnosis not present

## 2020-06-06 DIAGNOSIS — I6782 Cerebral ischemia: Secondary | ICD-10-CM | POA: Diagnosis not present

## 2020-06-06 DIAGNOSIS — Z91040 Latex allergy status: Secondary | ICD-10-CM | POA: Insufficient documentation

## 2020-06-06 DIAGNOSIS — I6523 Occlusion and stenosis of bilateral carotid arteries: Secondary | ICD-10-CM | POA: Diagnosis not present

## 2020-06-06 DIAGNOSIS — J9811 Atelectasis: Secondary | ICD-10-CM | POA: Diagnosis not present

## 2020-06-06 DIAGNOSIS — I671 Cerebral aneurysm, nonruptured: Secondary | ICD-10-CM | POA: Diagnosis not present

## 2020-06-06 DIAGNOSIS — Z87891 Personal history of nicotine dependence: Secondary | ICD-10-CM | POA: Insufficient documentation

## 2020-06-06 DIAGNOSIS — R4182 Altered mental status, unspecified: Secondary | ICD-10-CM | POA: Diagnosis not present

## 2020-06-06 DIAGNOSIS — I10 Essential (primary) hypertension: Secondary | ICD-10-CM | POA: Insufficient documentation

## 2020-06-06 DIAGNOSIS — J449 Chronic obstructive pulmonary disease, unspecified: Secondary | ICD-10-CM | POA: Diagnosis not present

## 2020-06-06 DIAGNOSIS — G9389 Other specified disorders of brain: Secondary | ICD-10-CM | POA: Diagnosis not present

## 2020-06-06 LAB — URINALYSIS, ROUTINE W REFLEX MICROSCOPIC
Bilirubin Urine: NEGATIVE
Glucose, UA: NEGATIVE mg/dL
Ketones, ur: NEGATIVE mg/dL
Nitrite: NEGATIVE
Protein, ur: NEGATIVE mg/dL
Specific Gravity, Urine: 1.003 — ABNORMAL LOW (ref 1.005–1.030)
pH: 6 (ref 5.0–8.0)

## 2020-06-06 LAB — I-STAT CHEM 8, ED
BUN: 13 mg/dL (ref 8–23)
Calcium, Ion: 1.23 mmol/L (ref 1.15–1.40)
Chloride: 109 mmol/L (ref 98–111)
Creatinine, Ser: 0.7 mg/dL (ref 0.44–1.00)
Glucose, Bld: 110 mg/dL — ABNORMAL HIGH (ref 70–99)
HCT: 34 % — ABNORMAL LOW (ref 36.0–46.0)
Hemoglobin: 11.6 g/dL — ABNORMAL LOW (ref 12.0–15.0)
Potassium: 4.3 mmol/L (ref 3.5–5.1)
Sodium: 141 mmol/L (ref 135–145)
TCO2: 20 mmol/L — ABNORMAL LOW (ref 22–32)

## 2020-06-06 LAB — CBG MONITORING, ED: Glucose-Capillary: 94 mg/dL (ref 70–99)

## 2020-06-06 LAB — COMPREHENSIVE METABOLIC PANEL
ALT: 7 U/L (ref 0–44)
AST: 12 U/L — ABNORMAL LOW (ref 15–41)
Albumin: 3 g/dL — ABNORMAL LOW (ref 3.5–5.0)
Alkaline Phosphatase: 116 U/L (ref 38–126)
Anion gap: 8 (ref 5–15)
BUN: 12 mg/dL (ref 8–23)
CO2: 20 mmol/L — ABNORMAL LOW (ref 22–32)
Calcium: 9.1 mg/dL (ref 8.9–10.3)
Chloride: 111 mmol/L (ref 98–111)
Creatinine, Ser: 0.73 mg/dL (ref 0.44–1.00)
GFR calc Af Amer: >60 mL/min (ref >60)
GFR calc non Af Amer: >60 mL/min (ref >60)
Glucose, Bld: 113 mg/dL — ABNORMAL HIGH (ref 70–99)
Potassium: 4.2 mmol/L (ref 3.5–5.1)
Sodium: 139 mmol/L (ref 135–145)
Total Bilirubin: 0.6 mg/dL (ref 0.3–1.2)
Total Protein: 6.1 g/dL — ABNORMAL LOW (ref 6.5–8.1)

## 2020-06-06 LAB — CBC
HCT: 36.8 % (ref 36.0–46.0)
Hemoglobin: 11.3 g/dL — ABNORMAL LOW (ref 12.0–15.0)
MCH: 30.6 pg (ref 26.0–34.0)
MCHC: 30.7 g/dL (ref 30.0–36.0)
MCV: 99.7 fL (ref 80.0–100.0)
Platelets: 192 10*3/uL (ref 150–400)
RBC: 3.69 MIL/uL — ABNORMAL LOW (ref 3.87–5.11)
RDW: 11.9 % (ref 11.5–15.5)
WBC: 8.5 10*3/uL (ref 4.0–10.5)
nRBC: 0 % (ref 0.0–0.2)

## 2020-06-06 MED ORDER — SODIUM CHLORIDE 0.9% FLUSH: 3.0000 mL | Freq: Once | INTRAVENOUS | Status: DC

## 2020-06-06 MED ORDER — IOHEXOL 350 MG/ML SOLN
75.0000 mL | Freq: Once | INTRAVENOUS | Status: AC | PRN
  Administered 2020-06-06: 75 mL via INTRAVENOUS

## 2020-06-06 NOTE — ED Provider Notes (Signed)
  Physical Exam  BP (!) 138/54   Pulse 53   Temp 98.5 F (36.9 C) (Oral)   Resp 16   Ht 5\' 4"  (1.626 m)   Wt 51.3 kg   SpO2 94%   BMI 19.40 kg/m   Physical Exam Vitals and nursing note reviewed.  Constitutional:      Appearance: Normal appearance.  HENT:     Head: Normocephalic.  Eyes:     Conjunctiva/sclera: Conjunctivae normal.  Pulmonary:     Effort: Pulmonary effort is normal.  Skin:    General: Skin is dry.  Neurological:     General: No focal deficit present.     Mental Status: She is alert and oriented to person, place, and time.  Psychiatric:        Mood and Affect: Mood normal.     ED Course/Procedures   Clinical Course as of Jun 06 1508  Sun Jun 06, 2020  Jun 08, 2020 Patient d/c from hospital yesterday after recent coiling of distal ICA aneurysm on 06/04/2020. Presented yesterday after d/c for AMS.  Workup today reveals MRI with acute multifocal ischemia right anterior cerebral artery territory and posterior limb of the right internal capsule.  Will consult neurosurgery to make them aware given this patient just left their service yesterday. Will also consult with neurology.    [KM]  [phone] I spoke with Dr. [id number] who will be in to see the patient. We went over MRI findings. He feels is patient is back at baseline she can go home still. Will still get neurology input. He will put note in chart   [KM]  48 Spoke with neurology who will review her MRI and coiling procedure and call back.    [KM]  [phone] Spoke with neurology who also spoke with stroke team and agree no further workup needed if patient at baseline. Recent surgical procedure appears to be possible cause of these MRI findings. Awaiting note from Dr. [id number]   [KM]  1043 Patient ok for d/c per neurosurgery.   [KM]    Clinical Course User Index [KM] Mikal Plane, PA-C    Procedures  MDM  Care assumed from PA Troy due to change of shift. Please see her note for full HPI. Patient has history of  subarachnoid hemorrhage, recent coiling of distal ICA aneurysm on 06/04/2020, presenting to the ED with change in mental status. Discussed with neurosurgery, Dr. 06/06/2020-- feels MRI is fine. Would not recommend further work-up beyond that since CTA reassuring and patient back to baseline.      Franky Macho 06/06/20 1509    Little, 06/08/20, MD 06/06/20 [phone]

## 2020-06-06 NOTE — ED Notes (Signed)
Pt sister Susie [phone]

## 2020-06-06 NOTE — ED Provider Notes (Signed)
[facility] Provider Note   CSN: [id number] Arrival date & time: 06/06/20  0012     History Chief Complaint  Patient presents with  . Altered Mental Status    Autumn Le is a 74 y.o. female.  The history is provided by medical records and the patient.  Altered Mental Status Associated symptoms: weakness     74 year old female with history of COPD, GERD, hypertension, Mnire's disease, history of subarachnoid hemorrhage, recent coiling of distal ICA aneurysm on 06/04/2020, presenting to the ED with change in mental status.  Patient was discharged from this facility earlier today and upon returning home to sister's house where she lives, she was noticing to display odd behaviors.  Reportedly she left the sink running, was standing in front of the shower and did not seem to know what to do, and has started urinating on herself.  Patient reports she feels the urge to urinate, but does not make it to the bathroom before she starts urinating.  This apparently started happening in the hospital prior to being discharged.  She has not lost control of her bowels.  She also reports her left leg feels very "heavy" and when walking she feels like she is dragging her leg.  She denies focal numbness of the leg and no weakness noted when lying/sitting.  She denies any upper extremity symptoms.  Denies any back pain.  No fever/chills/sweats.  No cough or other infectious symptoms.  Past Medical History:  Diagnosis Date  . AAA (abdominal aortic aneurysm) (HCC)    4.6 cm AAA 01/2020, 6 month f/u rec (Vascular surgeon: Fabienne Brunsharles Fields, MD)  . COPD (chronic obstructive pulmonary disease) (HCC)    patient denies - states she has no issues  . Degenerative joint disease of cervical spine   . GERD (gastroesophageal reflux disease)   . HLD (hyperlipidemia)   . Hypertension   . Meniere's disease in remission, bilateral   . Osteopenia   . Subarachnoid hemorrhage (HCC)    . Vertigo     Patient Active Problem List   Diagnosis Date Noted  . Cerebral aneurysm, nonruptured 06/04/2020  . AAA (abdominal aortic aneurysm) without rupture (HCC)   . Acute blood loss anemia   . Hypokalemia   . Leukocytosis   . Essential hypertension   . Agitation   . Ruptured cerebral aneurysm (HCC) 01/31/2020  . SAH (subarachnoid hemorrhage) (HCC) 01/23/2020    Past Surgical History:  Procedure Laterality Date  . CATARACT EXTRACTION W/ INTRAOCULAR LENS  IMPLANT, BILATERAL    . EYE SURGERY    . IR ANGIO INTRA EXTRACRAN SEL INTERNAL CAROTID BILAT MOD SED  01/23/2020  . IR ANGIO INTRA EXTRACRAN SEL INTERNAL CAROTID UNI R MOD SED  06/04/2020  . IR ANGIO VERTEBRAL SEL VERTEBRAL UNI L MOD SED  01/23/2020  . IR ANGIOGRAM FOLLOW UP STUDY  01/23/2020  . IR ANGIOGRAM FOLLOW UP STUDY  01/23/2020  . IR ANGIOGRAM FOLLOW UP STUDY  01/23/2020  . IR ANGIOGRAM FOLLOW UP STUDY  01/23/2020  . IR ANGIOGRAM FOLLOW UP STUDY  06/04/2020  . IR ANGIOGRAM FOLLOW UP STUDY  06/04/2020  . IR NEURO EACH ADD'L AFTER BASIC UNI RIGHT (MS)  06/04/2020  . IR TRANSCATH/EMBOLIZ  01/23/2020  . IR TRANSCATH/EMBOLIZ  06/04/2020  . IR US GUIDE VASC ACCESS RIGHT  06/04/2020  . RADIOLOGY WITH ANESTHESIA N/A 01/23/2020   Procedure: RADIOLOGY WITH ANESTHESIA;  Surgeon: Lisbeth RenshawNundkumar, Neelesh, MD;  [facility];  Service: Radiology;  Laterality: N/A;  . TONSILLECTOMY       OB History   No obstetric history on file.     Family History  Problem Relation Age of Onset  . Breast cancer Maternal Aunt     Social History   Tobacco Use  . Smoking status: Former Smoker    Quit date: 01/23/2020    Years since quitting: 0.3  . Smokeless tobacco: Never Used  Vaping Use  . Vaping Use: Never used  Substance Use Topics  . Alcohol use: Yes    Comment: occasional glass of wine   . Drug use: Not Currently    Home Medications Prior to Admission medications   Medication Sig Start Date End Date Taking? Authorizing Provider    acetaminophen (TYLENOL) 325 MG tablet Take 650 mg by mouth every 6 (six) hours as needed for headache (pain).    [provider]  amLODipine (NORVASC) 2.5 MG tablet Take 1 tablet (2.5 mg total) by mouth daily. 02/12/20   Angiulli, Mcarthur Rossetti, PA-C  Calcium Carbonate (CALCIUM 600 PO) Take 600 mg by mouth at bedtime.    [provider]  Cholecalciferol 50 MCG (2000 UT) CAPS Take 2,000 Units by mouth at bedtime.     [provider]  fluticasone (FLONASE) 50 MCG/ACT nasal spray Place 2 sprays into both nostrils daily.  09/09/16   [provider]  fluticasone furoate-vilanterol (BREO ELLIPTA) 100-25 MCG/INH AEPB Inhale 1 puff into the lungs daily.    [provider]  loratadine (CLARITIN) 10 MG tablet Take 10 mg by mouth at bedtime.  08/12/16   [provider]  niMODipine (NIMOTOP) 30 MG capsule Take 2 capsules (60 mg total) by mouth every 4 (four) hours. Thorough 02/13/19 Patient not taking: Reported on 05/26/2020 02/10/20   Love, Evlyn Kanner, PA-C  pantoprazole (PROTONIX) 40 MG tablet Take 1 tablet (40 mg total) by mouth daily. 02/12/20   Angiulli, Mcarthur Rossetti, PA-C  potassium chloride (KLOR-CON M15) 15 MEQ tablet Take 2 tablets (30 mEq total) by mouth 2 (two) times daily. 02/09/20   Love, Evlyn Kanner, PA-C  topiramate (TOPAMAX) 25 MG tablet Take 1 tablet (25 mg total) by mouth at bedtime. 02/12/20   Angiulli, Mcarthur Rossetti, PA-C  traMADol (ULTRAM) 50 MG tablet Take 1 tablet (50 mg total) by mouth every 6 (six) hours as needed for moderate pain. 02/12/20   Angiulli, Mcarthur Rossetti, PA-C    Allergies    Latex  Review of Systems   Review of Systems  Neurological: Positive for weakness.  All other systems reviewed and are negative.   Physical Exam Updated Vital Signs BP (!) 157/64   Pulse 62   Temp 98.5 F (36.9 C) (Oral)   Resp 18   Ht 5\' 4"  (1.626 m)   Wt 51.3 kg   SpO2 98%   BMI 19.40 kg/m   Physical Exam Vitals and nursing note reviewed.  Constitutional:       Appearance: She is well-developed.  HENT:     Head: Normocephalic and atraumatic.  Eyes:     Conjunctiva/sclera: Conjunctivae normal.     Pupils: Pupils are equal, round, and reactive to light.  Cardiovascular:     Rate and Rhythm: Normal rate and regular rhythm.     Heart sounds: Normal heart sounds.  Pulmonary:     Effort: Pulmonary effort is normal. No respiratory distress.     Breath sounds: Normal breath sounds. No rhonchi.  Abdominal:     General: Bowel sounds  are normal.     Palpations: Abdomen is soft.     Tenderness: There is no abdominal tenderness. There is no rebound.  Musculoskeletal:        General: Normal range of motion.     Cervical back: Normal range of motion.  Skin:    General: Skin is warm and dry.  Neurological:     Mental Status: She is alert and oriented to person, place, and time.     Cranial Nerves: No cranial nerve deficit.     Coordination: Coordination normal.     Comments: AAOx3, answering questions and following commands appropriately; equal strength UE and LE bilaterally; CN grossly intact; moves all extremities appropriately without ataxia, no apparent strength or sensory deficit noted; no facial asymmetry, speech is clear and goal oriented     ED Results / Procedures / Treatments   Labs (all labs ordered are listed, but only abnormal results are displayed) Labs Reviewed  COMPREHENSIVE METABOLIC PANEL - Abnormal; Notable for the following components:      Result Value   CO2 20 (*)    Glucose, Bld 113 (*)    Total Protein 6.1 (*)    Albumin 3.0 (*)    AST 12 (*)    All other components within normal limits  CBC - Abnormal; Notable for the following components:   RBC 3.69 (*)    Hemoglobin 11.3 (*)    All other components within normal limits  URINALYSIS, ROUTINE W REFLEX MICROSCOPIC - Abnormal; Notable for the following components:   Color, Urine COLORLESS (*)    Specific Gravity, Urine 1.003 (*)    Hgb urine dipstick SMALL (*)     Leukocytes,Ua LARGE (*)    Bacteria, UA RARE (*)    All other components within normal limits  I-STAT CHEM 8, ED - Abnormal; Notable for the following components:   Glucose, Bld 110 (*)    TCO2 20 (*)    Hemoglobin 11.6 (*)    HCT 34.0 (*)    All other components within normal limits  URINE CULTURE  CBG MONITORING, ED    EKG EKG Interpretation  Date/Time:  Sunday June 06 2020 00:14:01 EDT Ventricular Rate:  65 PR Interval:    QRS Duration: 88 QT Interval:  412 QTC Calculation: 429 R Axis:   11 Text Interpretation: Sinus rhythm Anteroseptal infarct, age indeterminate Confirmed by Gilda Crease [phone]) on 06/06/2020 12:21:31 AM   Radiology CT Angio Head W or Wo Contrast  Result Date: 06/06/2020 CLINICAL DATA:  Altered mental status. EXAM: CT ANGIOGRAPHY HEAD AND NECK TECHNIQUE: Multidetector CT imaging of the head and neck was performed using the standard protocol during bolus administration of intravenous contrast. Multiplanar CT image reconstructions and MIPs were obtained to evaluate the vascular anatomy. Carotid stenosis measurements (when applicable) are obtained utilizing NASCET criteria, using the distal internal carotid diameter as the denominator. CONTRAST:  75mL OMNIPAQUE IOHEXOL 350 MG/ML SOLN COMPARISON:  01/23/2020 FINDINGS: CT HEAD FINDINGS Brain: There is no mass, hemorrhage or extra-axial collection. There is a focus of intraparenchymal mineralization in the inferior right frontal lobe, slightly the larger compared to 01/23/2020. Skull: The visualized skull base, calvarium and extracranial soft tissues are normal. Sinuses/Orbits: No fluid levels or advanced mucosal thickening of the visualized paranasal sinuses. No mastoid or middle ear effusion. The orbits are normal. CTA NECK FINDINGS SKELETON: There is no bony spinal canal stenosis. No lytic or blastic lesion. OTHER NECK: Normal pharynx, larynx and major salivary glands. No  cervical lymphadenopathy. Unremarkable  thyroid gland. UPPER CHEST: Emphysema AORTIC ARCH: There is calcific atherosclerosis of the aortic arch. There is no aneurysm, dissection or hemodynamically significant stenosis of the visualized portion of the aorta. Conventional 3 vessel aortic branching pattern. The visualized proximal subclavian arteries are widely patent. RIGHT CAROTID SYSTEM: No dissection, occlusion or aneurysm. Mild atherosclerotic calcification at the carotid bifurcation without hemodynamically significant stenosis. LEFT CAROTID SYSTEM: No dissection, occlusion or aneurysm. There is mixed density atherosclerosis extending into the proximal ICA, resulting in less than 50% stenosis. VERTEBRAL ARTERIES: Left dominant configuration. Both origins are clearly patent. There is no dissection, occlusion or flow-limiting stenosis to the skull base (V1-V3 segments). CTA HEAD FINDINGS POSTERIOR CIRCULATION: --Vertebral arteries: Right vertebral artery terminates in PICA. Normal left V4 segment. --Inferior cerebellar arteries: Normal. --Basilar artery: Normal. --Superior cerebellar arteries: Normal. --Posterior cerebral arteries (PCA): Normal. The right PCA is predominantly supplied by the posterior communicating artery. ANTERIOR CIRCULATION: --Intracranial internal carotid arteries: Status post coiling of right communicating segment aneurysm without residual filling. Origin of the right P-comm remains patent. There is a medially directed aneurysm of the left ICA cavernous segment that measures 5 mm at its neck and 6 mm base to apex. --Anterior cerebral arteries (ACA): There is coiling of a A2 segment aneurysm. No residual filling identified. --Middle cerebral arteries (MCA): Normal. VENOUS SINUSES: As permitted by contrast timing, patent. ANATOMIC VARIANTS: None Review of the MIP images confirms the above findings. IMPRESSION: 1. No emergent large vessel occlusion or high-grade stenosis. 2. Status post coiling of right ICA communicating segment and  right ACA A2 segment anterior cerebral artery aneurysms without residual filling. 3. 5 mm medially directed aneurysm of the left ICA cavernous segment, unchanged. 4. Aortic Atherosclerosis (ICD10-I70.0) and Emphysema (ICD10-J43.9). Electronically Signed   By: Deatra Robinson M.D.   On: 06/06/2020 03:14   CT Angio Neck W and/or Wo Contrast  Result Date: 06/06/2020 CLINICAL DATA:  Altered mental status. EXAM: CT ANGIOGRAPHY HEAD AND NECK TECHNIQUE: Multidetector CT imaging of the head and neck was performed using the standard protocol during bolus administration of intravenous contrast. Multiplanar CT image reconstructions and MIPs were obtained to evaluate the vascular anatomy. Carotid stenosis measurements (when applicable) are obtained utilizing NASCET criteria, using the distal internal carotid diameter as the denominator. CONTRAST:  75mL OMNIPAQUE IOHEXOL 350 MG/ML SOLN COMPARISON:  01/23/2020 FINDINGS: CT HEAD FINDINGS Brain: There is no mass, hemorrhage or extra-axial collection. There is a focus of intraparenchymal mineralization in the inferior right frontal lobe, slightly the larger compared to 01/23/2020. Skull: The visualized skull base, calvarium and extracranial soft tissues are normal. Sinuses/Orbits: No fluid levels or advanced mucosal thickening of the visualized paranasal sinuses. No mastoid or middle ear effusion. The orbits are normal. CTA NECK FINDINGS SKELETON: There is no bony spinal canal stenosis. No lytic or blastic lesion. OTHER NECK: Normal pharynx, larynx and major salivary glands. No cervical lymphadenopathy. Unremarkable thyroid gland. UPPER CHEST: Emphysema AORTIC ARCH: There is calcific atherosclerosis of the aortic arch. There is no aneurysm, dissection or hemodynamically significant stenosis of the visualized portion of the aorta. Conventional 3 vessel aortic branching pattern. The visualized proximal subclavian arteries are widely patent. RIGHT CAROTID SYSTEM: No dissection,  occlusion or aneurysm. Mild atherosclerotic calcification at the carotid bifurcation without hemodynamically significant stenosis. LEFT CAROTID SYSTEM: No dissection, occlusion or aneurysm. There is mixed density atherosclerosis extending into the proximal ICA, resulting in less than 50% stenosis. VERTEBRAL ARTERIES: Left dominant configuration. Both origins are clearly patent.  There is no dissection, occlusion or flow-limiting stenosis to the skull base (V1-V3 segments). CTA HEAD FINDINGS POSTERIOR CIRCULATION: --Vertebral arteries: Right vertebral artery terminates in PICA. Normal left V4 segment. --Inferior cerebellar arteries: Normal. --Basilar artery: Normal. --Superior cerebellar arteries: Normal. --Posterior cerebral arteries (PCA): Normal. The right PCA is predominantly supplied by the posterior communicating artery. ANTERIOR CIRCULATION: --Intracranial internal carotid arteries: Status post coiling of right communicating segment aneurysm without residual filling. Origin of the right P-comm remains patent. There is a medially directed aneurysm of the left ICA cavernous segment that measures 5 mm at its neck and 6 mm base to apex. --Anterior cerebral arteries (ACA): There is coiling of a A2 segment aneurysm. No residual filling identified. --Middle cerebral arteries (MCA): Normal. VENOUS SINUSES: As permitted by contrast timing, patent. ANATOMIC VARIANTS: None Review of the MIP images confirms the above findings. IMPRESSION: 1. No emergent large vessel occlusion or high-grade stenosis. 2. Status post coiling of right ICA communicating segment and right ACA A2 segment anterior cerebral artery aneurysms without residual filling. 3. 5 mm medially directed aneurysm of the left ICA cavernous segment, unchanged. 4. Aortic Atherosclerosis (ICD10-I70.0) and Emphysema (ICD10-J43.9). Electronically Signed   By: Deatra Robinson M.D.   On: 06/06/2020 03:14   IR Transcath/Emboliz  Result Date: 06/04/2020 PROCEDURE: COIL  EMBOLIZATION OF RIGHT ANTERIOR CEREBRAL ARTERY ANEURYSM HISTORY: The patient is a 74 history of subarachnoid hemorrhage in March 2021 which she underwent embolization the right posterior communicating artery aneurysm. 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 She has made an excellent recovery and presents today for elective treatment the unruptured right ACA aneurysm. ACCESS: The technical aspects of the procedure as well as its potential risks and benefits were reviewed with the patient and her family. These risks included but were not limited to stroke, intracranial hemorrhage, bleeding, infection, allergic reaction, damage to organs or vital structures, stroke, non-diagnostic procedure, and the catastrophic outcomes of heart attack, coma, and death. With an understanding of these risks, informed consent was obtained and witnessed. The patient was placed in the supine position on the angiography table and the skin of right groin prepped in the usual sterile fashion. The procedure was performed under general anesthesia. Ultrasound guidance was used to introduce a micropuncture needle into the right superficial femoral artery, with a high common femoral artery bifurcation noted. This allowed direct visualization of needle into the lumen of the right superficial femoral artery. Eight [place] access was then placed using Seldinger technique over a Glidewire. MEDICATIONS: HEPARIN: 2000 Units total. CONTRAST:  65mL OMNIPAQUE IOHEXOL 300 MG/ML  SOLNcc, Omnipaque 300 FLUOROSCOPY TIME:  FLUOROSCOPY TIME: See IR records TECHNIQUE: CATHETERS AND WIRES 5-French JB-1 catheter 180 cm 0.035" glidewire 6-French NeuronMax guide sheath 6-French Berenstein Select JB-1 catheter 0.058" CatV guidecatheter 150 cm Phenom  microcatheter Synchro 2 select standard microwire Synchro 2 standard exchange length microwire Excelsior XT-17 microcatheter COILS USED Target XL 360 4 mm x 12 cm Target XL 360 3  mm x 6 cm VESSELS CATHETERIZED Right internal carotid artery Right anterior cerebral artery Right common femoral artery VESSELS STUDIED Right internal carotid artery, head (pre-embolization) Right internal carotid artery, head (during embolization) Right internal carotid artery, head (immediate post-embolization) Right internal carotid artery, head (final control) Right common femoral PROCEDURAL NARRATIVE Under real-time fluoroscopy, the guide sheath was advanced over the select catheter and glidewire into the descending aorta. The select catheter was then advanced into the distal cervical right internal carotid artery. The  guide sheath was then advanced into the proximal cervical right internal carotid artery. The Select catheter was removed and the 058 guide catheter was coaxially introduced over the microcatheter and microwire. The microcatheter was then advanced under roadmap guidance into the right anterior cerebral artery. The guide catheter was then tracked over the microcatheter to its final position in the distal supraclinoid right internal carotid artery. At this point, the 27 catheter was in the distal right A1 segment. I therefore elected to perform an microcatheter exchange in order to maintain access to the anterior cerebral artery. The microwire was removed and replaced with the exchange length synchro 2 standard microwire. The Phenom microcatheter was then exchanged for the XT 17 microcatheter. Using the exchange length microwire, the aneurysm was selected in the aneurysm catheterized with the microcatheter. The wire was then removed. The above coils were then sequentially deployed with progressive exclusion of the aneurysm. Angiograms were taken during embolization. Cerebral angiography was performed immediately post embolization. The coiling catheter was then removed and the guide catheter withdrawn into the cervical internal carotid artery. Final control angiography was then performed from the  guide catheter. The guide catheter and guide sheath were then synchronously removed without incident. FINDINGS: Right internal carotid, head (pre-embolization): Injection reveals the presence of a widely patent ICA, M1, and A1 segments and their branches. 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 There is sub-millimeter neck residual with patency of the large fetal type posterior cerebral artery. Again seen is the more distal A2 A3 junction aneurysm with relatively narrow neck. This aneurysm measures approximately 4.5 x 4 x 5 mm. The parenchymal and venous phases are normal. The venous sinuses are widely patent. Right internal carotid artery, head (during embolization): Injection reveals the presence of a widely patent ICA that leads to a patent ACA and MCA. Coil mass within the aneurysm is stable, without coil prolapse or filling defect to suggest thrombus. The more distal branches of the anterior cerebral artery remain patent including the callosum marginal and pericallosal branches. Right internal carotid artery, head (immediate post-embolization): Injection reveals the presence of a widely patent ICA that leads to a patent ACA and MCA. Coil mass within the aneurysm is stable, without coil prolapse or filling defect to suggest thrombus. Distal ACA branches remain patent. No aneurysm filling is seen. Right internal carotid artery, head (final control): Injection reveals the presence of a widely patent ICA that leads to a patent ACA and MCA. No thrombus is visualized. Coil mass is seen within the aneurysm and is in stable position. There are no perfusion deficits during capillary phase. Venous phase is unremarkable. Right femoral: Arterial sheath in adequate position. 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 DISPOSITION: Upon completion of the study, the femoral sheath was  removed and hemostasis obtained using manual compression stenosis of distal common femoral and superficial femoral arteries. Good proximal and distal lower extremity pulses were documented upon achievement of hemostasis. The procedure was well tolerated and no early complications were observed. The patient was transferred to the postanesthesia care unit in stable hemodynamic condition. IMPRESSION: 1. Successful coil embolization of a distal right A2 A3 junction aneurysm without aneurysm filling post-treatment 2. 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 Electronically  Signed   By: Lisbeth Renshaw   On: 06/04/2020 11:23   IR Angiogram Follow Up Study  Result Date: 06/04/2020 PROCEDURE: COIL EMBOLIZATION OF RIGHT ANTERIOR CEREBRAL ARTERY ANEURYSM HISTORY: The patient is a 74 history of subarachnoid hemorrhage in March 2021 which she underwent embolization the right posterior communicating artery aneurysm. 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 She has made an excellent recovery and presents today for elective treatment the unruptured right ACA aneurysm. ACCESS: The technical aspects of the procedure as well as its potential risks and benefits were reviewed with the patient and her family. These risks included but were not limited to stroke, intracranial hemorrhage, bleeding, infection, allergic reaction, damage to organs or vital structures, stroke, non-diagnostic procedure, and the catastrophic outcomes of heart attack, coma, and death. With an understanding of these risks, informed consent was obtained and witnessed. The patient was placed in the supine position on the angiography table and the skin of right groin prepped in the usual sterile  fashion. The procedure was performed under general anesthesia. Ultrasound guidance was used to introduce a micropuncture needle into the right superficial femoral artery, with a high common femoral artery bifurcation noted. This allowed direct visualization of needle into the lumen of the right superficial femoral artery. Eight [place] access was then placed using Seldinger technique over a Glidewire. MEDICATIONS: HEPARIN: 2000 Units total. CONTRAST:  65mL OMNIPAQUE IOHEXOL 300 MG/ML  SOLNcc, Omnipaque 300 FLUOROSCOPY TIME:  FLUOROSCOPY TIME: See IR records TECHNIQUE: CATHETERS AND WIRES 5-French JB-1 catheter 180 cm 0.035" glidewire 6-French NeuronMax guide sheath 6-French Berenstein Select JB-1 catheter 0.058" CatV guidecatheter 150 cm Phenom  microcatheter Synchro 2 select standard microwire Synchro 2 standard exchange length microwire Excelsior XT-17 microcatheter COILS USED Target XL 360 4 mm x 12 cm Target XL 360 3 mm x 6 cm VESSELS CATHETERIZED Right internal carotid artery Right anterior cerebral artery Right common femoral artery VESSELS STUDIED Right internal carotid artery, head (pre-embolization) Right internal carotid artery, head (during embolization) Right internal carotid artery, head (immediate post-embolization) Right internal carotid artery, head (final control) Right common femoral PROCEDURAL NARRATIVE Under real-time fluoroscopy, the guide sheath was advanced over the select catheter and glidewire into the descending aorta. The select catheter was then advanced into the distal cervical right internal carotid artery. The guide sheath was then advanced into the proximal cervical right internal carotid artery. The Select catheter was removed and the 058 guide catheter was coaxially introduced over the microcatheter and microwire. The microcatheter was then advanced under roadmap guidance into the right anterior cerebral artery. The guide catheter was then tracked over the microcatheter to its final  position in the distal supraclinoid right internal carotid artery. At this point, the 27 catheter was in the distal right A1 segment. I therefore elected to perform an microcatheter exchange in order to maintain access to the anterior cerebral artery. The microwire was removed and replaced with the exchange length synchro 2 standard microwire. The Phenom microcatheter was then exchanged for the XT 17 microcatheter. Using the exchange length microwire, the aneurysm was selected in the aneurysm catheterized with the microcatheter. The wire was then removed. The above coils were then sequentially deployed with progressive exclusion of the aneurysm. Angiograms were taken during embolization. Cerebral angiography was performed immediately post embolization. The coiling catheter was then removed and the guide catheter withdrawn into the cervical internal carotid artery. Final control angiography was then performed from the guide catheter. The guide catheter  and guide sheath were then synchronously removed without incident. FINDINGS: Right internal carotid, head (pre-embolization): Injection reveals the presence of a widely patent ICA, M1, and A1 segments and their branches. 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 There is sub-millimeter neck residual with patency of the large fetal type posterior cerebral artery. Again seen is the more distal A2 A3 junction aneurysm with relatively narrow neck. This aneurysm measures approximately 4.5 x 4 x 5 mm. The parenchymal and venous phases are normal. The venous sinuses are widely patent. Right internal carotid artery, head (during embolization): Injection reveals the presence of a widely patent ICA that leads to a patent ACA and MCA. Coil mass within the aneurysm is stable, without coil prolapse or filling defect to suggest thrombus. The more distal branches of the anterior cerebral artery remain patent including the  callosum marginal and pericallosal branches. Right internal carotid artery, head (immediate post-embolization): Injection reveals the presence of a widely patent ICA that leads to a patent ACA and MCA. Coil mass within the aneurysm is stable, without coil prolapse or filling defect to suggest thrombus. Distal ACA branches remain patent. No aneurysm filling is seen. Right internal carotid artery, head (final control): Injection reveals the presence of a widely patent ICA that leads to a patent ACA and MCA. No thrombus is visualized. Coil mass is seen within the aneurysm and is in stable position. There are no perfusion deficits during capillary phase. Venous phase is unremarkable. Right femoral: Arterial sheath in adequate position. 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 DISPOSITION: Upon completion of the study, the femoral sheath was removed and hemostasis obtained using manual compression stenosis of distal common femoral and superficial femoral arteries. Good proximal and distal lower extremity pulses were documented upon achievement of hemostasis. The procedure was well tolerated and no early complications were observed. The patient was transferred to the postanesthesia care unit in stable hemodynamic condition. IMPRESSION: 1. Successful coil embolization of a distal right A2 A3 junction aneurysm without aneurysm filling post-treatment 2. 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 She has made an excellent recovery and presents today for elective treatment the unruptured right ACA aneurysm. ACCESS: The technical aspects of the procedure as well as its potential risks and benefits were reviewed with the patient and her family. These risks included but were not limited to stroke, intracranial hemorrhage, bleeding, infection, allergic reaction, damage to organs or vital structures, stroke, non-diagnostic procedure, and the catastrophic outcomes of heart attack, coma, and death. With an understanding of these risks, informed consent was obtained and witnessed. The patient was placed in the supine position on the angiography table and the skin of right groin prepped in the usual sterile fashion. The procedure was performed under general anesthesia. Ultrasound guidance was used to introduce a micropuncture needle into the right superficial  femoral artery, with a high common femoral artery bifurcation noted. This allowed direct visualization of needle into the lumen of the right superficial femoral artery. Eight [place] access was then placed using Seldinger technique over a Glidewire. MEDICATIONS: HEPARIN: 2000 Units total. CONTRAST:  65mL OMNIPAQUE IOHEXOL 300 MG/ML  SOLNcc, Omnipaque 300 FLUOROSCOPY TIME:  FLUOROSCOPY TIME: See IR records TECHNIQUE: CATHETERS AND WIRES 5-French JB-1 catheter 180 cm 0.035" glidewire 6-French NeuronMax guide sheath 6-French Berenstein Select JB-1 catheter 0.058" CatV guidecatheter 150 cm Phenom  microcatheter Synchro 2 select standard microwire Synchro 2 standard exchange length microwire Excelsior XT-17 microcatheter COILS USED Target XL 360 4 mm x 12 cm Target XL 360 3 mm x 6 cm VESSELS CATHETERIZED Right internal carotid artery Right anterior  cerebral artery Right common femoral artery VESSELS STUDIED Right internal carotid artery, head (pre-embolization) Right internal carotid artery, head (during embolization) Right internal carotid artery, head (immediate post-embolization) Right internal carotid artery, head (final control) Right common femoral PROCEDURAL NARRATIVE Under real-time fluoroscopy, the guide sheath was advanced over the select catheter and glidewire into the descending aorta. The select catheter was then advanced into the distal cervical right internal carotid artery. The guide sheath was then advanced into the proximal cervical right internal carotid artery. The Select catheter was removed and the 058 guide catheter was coaxially introduced over the microcatheter and microwire. The microcatheter was then advanced under roadmap guidance into the right anterior cerebral artery. The guide catheter was then tracked over the microcatheter to its final position in the distal supraclinoid right internal carotid artery. At this point, the 27 catheter was in the distal right A1 segment. I therefore elected to perform an microcatheter exchange in order to maintain access to the anterior cerebral artery. The microwire was removed and replaced with the exchange length synchro 2 standard microwire. The Phenom microcatheter was then exchanged for the XT 17 microcatheter. Using the exchange length microwire, the aneurysm was selected in the aneurysm catheterized with the microcatheter. The wire was then removed. The above coils were then sequentially deployed with progressive exclusion of the aneurysm. Angiograms were taken during embolization. Cerebral angiography was performed immediately post embolization. The coiling catheter was then removed and the guide catheter withdrawn into the cervical internal carotid artery. Final control angiography was then performed from the guide catheter. The guide catheter and guide sheath were then synchronously  removed without incident. FINDINGS: Right internal carotid, head (pre-embolization): Injection reveals the presence of a widely patent ICA, M1, and A1 segments and their branches. 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 There is sub-millimeter neck residual with patency of the large fetal type posterior cerebral artery. Again seen is the more distal A2 A3 junction aneurysm with relatively narrow neck. This aneurysm measures approximately 4.5 x 4 x 5 mm. The parenchymal and venous phases are normal. The venous sinuses are widely patent. Right internal carotid artery, head (during embolization): Injection reveals the presence of a widely patent ICA that leads to a patent ACA and MCA. Coil mass within the aneurysm is stable, without coil prolapse or filling defect to suggest thrombus. The more distal branches of the anterior cerebral artery remain patent including the callosum marginal and pericallosal branches. Right internal carotid artery, head (immediate post-embolization): Injection reveals the presence of a widely patent ICA that leads to a patent ACA and MCA. Coil mass within the aneurysm is stable, without coil prolapse or filling defect to suggest thrombus. Distal ACA branches  remain patent. No aneurysm filling is seen. Right internal carotid artery, head (final control): Injection reveals the presence of a widely patent ICA that leads to a patent ACA and MCA. No thrombus is visualized. Coil mass is seen within the aneurysm and is in stable position. There are no perfusion deficits during capillary phase. Venous phase is unremarkable. Right femoral: Arterial sheath in adequate position. 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 DISPOSITION: Upon completion of the study, the femoral sheath was removed and hemostasis obtained using manual compression stenosis of distal  common femoral and superficial femoral arteries. Good proximal and distal lower extremity pulses were documented upon achievement of hemostasis. The procedure was well tolerated and no early complications were observed. The patient was transferred to the postanesthesia care unit in stable hemodynamic condition. IMPRESSION: 1. Successful coil embolization of a distal right A2 A3 junction aneurysm without aneurysm filling post-treatment 2. 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 She has made an excellent recovery and presents today for elective treatment the unruptured right ACA aneurysm. ACCESS: The technical aspects of the procedure as well as its potential risks and benefits were reviewed with the patient and her family. These risks included but were not limited to stroke, intracranial hemorrhage, bleeding, infection, allergic reaction, damage to organs or vital structures, stroke, non-diagnostic procedure, and the catastrophic outcomes of heart attack, coma, and death. With an understanding of these risks, informed consent was obtained and witnessed. The patient was placed in the supine position on the angiography table and the skin of right groin prepped in the usual sterile fashion. The procedure was performed under general anesthesia. Ultrasound  guidance was used to introduce a micropuncture needle into the right superficial femoral artery, with a high common femoral artery bifurcation noted. This allowed direct visualization of needle into the lumen of the right superficial femoral artery. Eight [place] access was then placed using Seldinger technique over a Glidewire. MEDICATIONS: HEPARIN: 2000 Units total. CONTRAST:  65mL OMNIPAQUE IOHEXOL 300 MG/ML  SOLNcc, Omnipaque 300 FLUOROSCOPY TIME:  FLUOROSCOPY TIME: See IR records TECHNIQUE: CATHETERS AND WIRES 5-French JB-1 catheter 180 cm 0.035" glidewire 6-French NeuronMax guide sheath 6-French Berenstein Select JB-1 catheter 0.058" CatV guidecatheter 150 cm Phenom  microcatheter Synchro 2 select standard microwire Synchro 2 standard exchange length microwire Excelsior XT-17 microcatheter COILS USED Target XL 360 4 mm x 12 cm Target XL 360 3 mm x 6 cm VESSELS CATHETERIZED Right internal carotid artery Right anterior cerebral artery Right common femoral artery VESSELS STUDIED Right internal carotid artery, head (pre-embolization) Right internal carotid artery, head (during embolization) Right internal carotid artery, head (immediate post-embolization) Right internal carotid artery, head (final control) Right common femoral PROCEDURAL NARRATIVE Under real-time fluoroscopy, the guide sheath was advanced over the select catheter and glidewire into the descending aorta. The select catheter was then advanced into the distal cervical  right internal carotid artery. The guide sheath was then advanced into the proximal cervical right internal carotid artery. The Select catheter was removed and the 058 guide catheter was coaxially introduced over the microcatheter and microwire. The microcatheter was then advanced under roadmap guidance into the right anterior cerebral artery. The guide catheter was then tracked over the microcatheter to its final position in the distal supraclinoid right internal carotid artery. At this  point, the 27 catheter was in the distal right A1 segment. I therefore elected to perform an microcatheter exchange in order to maintain access to the anterior cerebral artery. The microwire was removed and replaced with the exchange length synchro 2 standard microwire. The Phenom microcatheter was then exchanged for the XT 17 microcatheter. Using the exchange length microwire, the aneurysm was selected in the aneurysm catheterized with the microcatheter. The wire was then removed. The above coils were then sequentially deployed with progressive exclusion of the aneurysm. Angiograms were taken during embolization. Cerebral angiography was performed immediately post embolization. The coiling catheter was then removed and the guide catheter withdrawn into the cervical internal carotid artery. Final control angiography was then performed from the guide catheter. The guide catheter and guide sheath were then synchronously removed without incident. FINDINGS: Right internal carotid, head (pre-embolization): Injection reveals the presence of a widely patent ICA, M1, and A1 segments and their branches. 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 There is sub-millimeter neck residual with patency of the large fetal type posterior cerebral artery. Again seen is the more distal A2 A3 junction aneurysm with relatively narrow neck. This aneurysm measures approximately 4.5 x 4 x 5 mm. The parenchymal and venous phases are normal. The venous sinuses are widely patent. Right internal carotid artery, head (during embolization): Injection reveals the presence of a widely patent ICA that leads to a patent ACA and MCA. Coil mass within the aneurysm is stable, without coil prolapse or filling defect to suggest thrombus. The more distal branches of the anterior cerebral artery remain patent including the callosum marginal and pericallosal branches. Right internal carotid artery, head  (immediate post-embolization): Injection reveals the presence of a widely patent ICA that leads to a patent ACA and MCA. Coil mass within the aneurysm is stable, without coil prolapse or filling defect to suggest thrombus. Distal ACA branches remain patent. No aneurysm filling is seen. Right internal carotid artery, head (final control): Injection reveals the presence of a widely patent ICA that leads to a patent ACA and MCA. No thrombus is visualized. Coil mass is seen within the aneurysm and is in stable position. There are no perfusion deficits during capillary phase. Venous phase is unremarkable. Right femoral: Arterial sheath in adequate position. 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 DISPOSITION: Upon completion of the study, the femoral sheath was removed and hemostasis obtained using manual compression stenosis of distal common femoral and superficial femoral arteries. Good proximal and distal lower extremity pulses were documented upon achievement of hemostasis. The procedure was well tolerated and no early complications were observed. The patient was transferred to the postanesthesia care unit in stable hemodynamic condition. IMPRESSION: 1. Successful coil embolization of a distal right A2 A3 junction aneurysm without aneurysm filling post-treatment 2. 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 Electronically Signed   By: Lisbeth Renshaw   On: 06/04/2020 11:23   DG Chest Port 1 View  Result Date: 06/06/2020 CLINICAL DATA:  Altered mental status EXAM: PORTABLE CHEST 1 VIEW COMPARISON:  CT chest dated January 23, 2020. FINDINGS: The heart size is normal. Aortic calcifications are noted. There  is no pneumothorax. Bronchitic changes are noted at the lung bases bilaterally with scattered areas of scarring and presumed atelectasis. There is no acute osseous abnormality. No large pleural effusion. IMPRESSION: No active disease. Electronically Signed   By: Katherine Mantle M.D.   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 She has made an excellent recovery and presents today for elective treatment the unruptured right ACA aneurysm. ACCESS: The technical aspects of the procedure as well as its potential risks and benefits were reviewed with the patient and her family. These risks included but were not limited to stroke, intracranial hemorrhage, bleeding, infection, allergic reaction, damage to organs or vital structures, stroke, non-diagnostic procedure, and the catastrophic outcomes of heart attack, coma, and death. With an understanding of these risks, informed consent was obtained and witnessed. The patient was placed in the supine position on the angiography table and the skin of right groin prepped in the usual sterile fashion. The procedure was performed under general anesthesia. Ultrasound guidance was used to introduce a micropuncture needle into the right superficial femoral artery, with a high common femoral artery bifurcation noted. This allowed direct visualization of needle into the lumen of the right superficial femoral artery. Eight [place] access was then placed using Seldinger technique over a Glidewire. MEDICATIONS: HEPARIN: 2000 Units total. CONTRAST:  58mL OMNIPAQUE IOHEXOL 300 MG/ML  SOLNcc, Omnipaque 300  FLUOROSCOPY TIME:  FLUOROSCOPY TIME: See IR records TECHNIQUE: CATHETERS AND WIRES 5-French JB-1 catheter 180 cm 0.035" glidewire 6-French NeuronMax guide sheath 6-French Berenstein Select JB-1 catheter 0.058" CatV guidecatheter 150 cm Phenom  microcatheter Synchro 2 select standard microwire Synchro 2 standard exchange length microwire Excelsior XT-17 microcatheter COILS USED Target XL 360 4 mm x 12 cm Target XL 360 3 mm x 6 cm VESSELS CATHETERIZED Right internal carotid artery Right anterior cerebral artery Right common femoral artery VESSELS STUDIED Right internal carotid artery, head (pre-embolization) Right internal carotid artery, head (during embolization) Right internal carotid artery, head (immediate post-embolization) Right internal carotid artery, head (final control) Right common femoral PROCEDURAL NARRATIVE Under real-time fluoroscopy, the guide sheath was advanced over the select catheter and glidewire into the descending aorta. The select catheter was then advanced into the distal cervical right internal carotid artery. The guide sheath was then advanced into the proximal cervical right internal carotid artery. The Select catheter was removed and the 058 guide catheter was coaxially introduced over the microcatheter and microwire. The microcatheter was then advanced under roadmap guidance into the right anterior cerebral artery. The guide catheter was then tracked over the microcatheter to its final position in the distal supraclinoid right internal carotid artery. At this point, the 27 catheter was in the distal right A1 segment. I therefore elected to perform an microcatheter exchange in order to maintain access to the anterior cerebral artery. The  microwire was removed and replaced with the exchange length synchro 2 standard microwire. The Phenom microcatheter was then exchanged for the XT 17 microcatheter. Using the exchange length microwire, the aneurysm was selected in the aneurysm catheterized  with the microcatheter. The wire was then removed. The above coils were then sequentially deployed with progressive exclusion of the aneurysm. Angiograms were taken during embolization. Cerebral angiography was performed immediately post embolization. The coiling catheter was then removed and the guide catheter withdrawn into the cervical internal carotid artery. Final control angiography was then performed from the guide catheter. The guide catheter and guide sheath were then synchronously removed without incident. FINDINGS: Right internal carotid, head (pre-embolization): Injection reveals the presence of a widely patent ICA, M1, and A1 segments and their branches. 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 There is sub-millimeter neck residual with patency of the large fetal type posterior cerebral artery. Again seen is the more distal A2 A3 junction aneurysm with relatively narrow neck. This aneurysm measures approximately 4.5 x 4 x 5 mm. The parenchymal and venous phases are normal. The venous sinuses are widely patent. Right internal carotid artery, head (during embolization): Injection reveals the presence of a widely patent ICA that leads to a patent ACA and MCA. Coil mass within the aneurysm is stable, without coil prolapse or filling defect to suggest thrombus. The more distal branches of the anterior cerebral artery remain patent including the callosum marginal and pericallosal branches. Right internal carotid artery, head (immediate post-embolization): Injection reveals the presence of a widely patent ICA that leads to a patent ACA and MCA. Coil mass within the aneurysm is stable, without coil prolapse or filling defect to suggest thrombus. Distal ACA branches remain patent. No aneurysm filling is seen. Right internal carotid artery, head (final control): Injection reveals the presence of a widely patent ICA that leads to a patent ACA and MCA. No  thrombus is visualized. Coil mass is seen within the aneurysm and is in stable position. There are no perfusion deficits during capillary phase. Venous phase is unremarkable. Right femoral: Arterial sheath in adequate position. 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 DISPOSITION: Upon completion of the study, the femoral sheath was removed and hemostasis obtained using manual compression stenosis of distal common femoral and superficial femoral arteries. Good proximal and distal lower extremity pulses were documented upon achievement of hemostasis. The procedure was well tolerated and no early complications were observed. The patient was transferred to the postanesthesia care unit in stable hemodynamic condition. IMPRESSION: 1. Successful coil embolization of a distal right A2 A3 junction aneurysm without aneurysm filling post-treatment 2. 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 She has made an excellent recovery and presents today for elective treatment the unruptured right ACA aneurysm. ACCESS: The technical aspects of the procedure as well  as its potential risks and benefits were reviewed with the patient and her family. These risks included but  were not limited to stroke, intracranial hemorrhage, bleeding, infection, allergic reaction, damage to organs or vital structures, stroke, non-diagnostic procedure, and the catastrophic outcomes of heart attack, coma, and death. With an understanding of these risks, informed consent was obtained and witnessed. The patient was placed in the supine position on the angiography table and the skin of right groin prepped in the usual sterile fashion. The procedure was performed under general anesthesia. Ultrasound guidance was used to introduce a micropuncture needle into the right superficial femoral artery, with a high common femoral artery bifurcation noted. This allowed direct visualization of needle into the lumen of the right superficial femoral artery. Eight [place] access was then placed using Seldinger technique over a Glidewire. MEDICATIONS: HEPARIN: 2000 Units total. CONTRAST:  65mL OMNIPAQUE IOHEXOL 300 MG/ML  SOLNcc, Omnipaque 300 FLUOROSCOPY TIME:  FLUOROSCOPY TIME: See IR records TECHNIQUE: CATHETERS AND WIRES 5-French JB-1 catheter 180 cm 0.035" glidewire 6-French NeuronMax guide sheath 6-French Berenstein Select JB-1 catheter 0.058" CatV guidecatheter 150 cm Phenom  microcatheter Synchro 2 select standard microwire Synchro 2 standard exchange length microwire Excelsior XT-17 microcatheter COILS USED Target XL 360 4 mm x 12 cm Target XL 360 3 mm x 6 cm VESSELS CATHETERIZED Right internal carotid artery Right anterior cerebral artery Right common femoral artery VESSELS STUDIED Right internal carotid artery, head (pre-embolization) Right internal carotid artery, head (during embolization) Right internal carotid artery, head (immediate post-embolization) Right internal carotid artery, head (final control) Right common femoral PROCEDURAL NARRATIVE Under real-time fluoroscopy, the guide sheath was advanced over the select catheter and glidewire into the descending aorta. The select catheter was then advanced  into the distal cervical right internal carotid artery. The guide sheath was then advanced into the proximal cervical right internal carotid artery. The Select catheter was removed and the 058 guide catheter was coaxially introduced over the microcatheter and microwire. The microcatheter was then advanced under roadmap guidance into the right anterior cerebral artery. The guide catheter was then tracked over the microcatheter to its final position in the distal supraclinoid right internal carotid artery. At this point, the 27 catheter was in the distal right A1 segment. I therefore elected to perform an microcatheter exchange in order to maintain access to the anterior cerebral artery. The microwire was removed and replaced with the exchange length synchro 2 standard microwire. The Phenom microcatheter was then exchanged for the XT 17 microcatheter. Using the exchange length microwire, the aneurysm was selected in the aneurysm catheterized with the microcatheter. The wire was then removed. The above coils were then sequentially deployed with progressive exclusion of the aneurysm. Angiograms were taken during embolization. Cerebral angiography was performed immediately post embolization. The coiling catheter was then removed and the guide catheter withdrawn into the cervical internal carotid artery. Final control angiography was then performed from the guide catheter. The guide catheter and guide sheath were then synchronously removed without incident. FINDINGS: Right internal carotid, head (pre-embolization): Injection reveals the presence of a widely patent ICA, M1, and A1 segments and their branches. 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 There is sub-millimeter neck residual with patency of the large fetal type posterior cerebral artery. Again seen is the more distal A2 A3 junction aneurysm with relatively narrow neck. This aneurysm measures  approximately 4.5 x 4 x 5 mm. The parenchymal and venous phases are normal. The venous  sinuses are widely patent. Right internal carotid artery, head (during embolization): Injection reveals the presence of a widely patent ICA that leads to a patent ACA and MCA. Coil mass within the aneurysm is stable, without coil prolapse or filling defect to suggest thrombus. The more distal branches of the anterior cerebral artery remain patent including the callosum marginal and pericallosal branches. Right internal carotid artery, head (immediate post-embolization): Injection reveals the presence of a widely patent ICA that leads to a patent ACA and MCA. Coil mass within the aneurysm is stable, without coil prolapse or filling defect to suggest thrombus. Distal ACA branches remain patent. No aneurysm filling is seen. Right internal carotid artery, head (final control): Injection reveals the presence of a widely patent ICA that leads to a patent ACA and MCA. No thrombus is visualized. Coil mass is seen within the aneurysm and is in stable position. There are no perfusion deficits during capillary phase. Venous phase is unremarkable. Right femoral: Arterial sheath in adequate position. 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 DISPOSITION: Upon completion of the study, the femoral sheath was removed and hemostasis obtained using manual compression stenosis of distal common femoral and superficial femoral arteries. Good proximal and distal lower extremity pulses were documented upon achievement of hemostasis. The procedure was well tolerated and no early complications were observed. The patient was transferred to the postanesthesia care unit in stable hemodynamic condition. IMPRESSION: 1. Successful coil embolization of a distal right A2 A3 junction aneurysm without aneurysm filling post-treatment 2. Continued occlusion of previously treated right  posterior communicating artery aneurysm with miniscule, sub-millimeter neck residual and patency of the fetal type posterior cerebral artery The preliminary results of this procedure were shared with the patient's family. Electronically Signed   By: Lisbeth Renshaw   On: 06/04/2020 11:23    Procedures Procedures (including critical care time)  Medications Ordered in ED Medications  sodium chloride flush (NS) 0.9 % injection 3 mL (3 mLs Intravenous Not Given 06/06/20 0041)  iohexol (OMNIPAQUE) 350 MG/ML injection 75 mL (75 mLs Intravenous Contrast Given 06/06/20 0249)    ED Course  I have reviewed the triage vital signs and the nursing notes.  Pertinent labs & imaging results that were available during my care of the patient were reviewed by me and considered in my medical decision making (see chart for details).    MDM Rules/Calculators/A&P    74 y.o. F presenting to the ED with AMS and left leg weakness.  She underwent coiling of right ICA by Dr. Conchita Paris on 06/04/2020.  Was discharged earlier today.  While at home with sister she was noticed to be somewhat confused, leaving water running, standing in front of the shower and not knowing what to do.  Sister states these behaviors are very abnormal for her.  She also reports that while walking she felt like her left leg was "heavy".  No symptoms like this prior to coiling.  She is awake, alert, properly oriented here.  She is able to follow commands and answer questions appropriately.  She does not have any focal strength or sensory deficit on my exam.  Will obtain labs.  Given recent coiling, will obtain CTA of the head and neck.  CTA head/neck without acute findings.  Patient was able to get out of bed and ambulate in the room without difficulty.  States left leg no longer feels heavy, feels she is walking normally now.  4:44 AM Discussed with neurology, Dr. Amada Jupiter-- would recommend MRI brain without contrast and if negative can  discharge home.  Did request that we speak with neurosurgery given recent intervention and discharge from their service to see if they have any other input.  4:52 AM Discussed with neurosurgery, Dr. Franky Macho-- feels MRI is fine.  Would not recommend further work-up beyond that since CTA reassuring and patient back to baseline.  6:37 AM MRI pending.  Care will be signed out to oncoming provider to follow-up on results.  If negative, plan to d/c home with OP neurosurgery follow-up and strict return precautions for any new/acute changes.  Final Clinical Impression(s) / ED Diagnoses Final diagnoses:  Transient alteration of awareness    Rx / DC Orders ED Discharge Orders    None       Garlon Hatchet, PA-C 06/06/20 3220    Gilda Crease, MD 06/06/20 2542    Gilda Crease, MD 06/06/20 [phone]

## 2020-06-06 NOTE — Progress Notes (Signed)
Ok for dischARGE. There are no neurosurgical issues.  Alert, oriented x4 Perrl, full eom No drift Hearing intact to finger rub  Shoulder shrug is normal, facies are symmetric 5/5 strength Sensation intact;  Mri reflects changes from coiling.  We will see in follow up

## 2020-06-06 NOTE — Discharge Instructions (Signed)
You were seen for altered mental status after being discharged after your brain procedure. Your workup showed some new ischemia on your MRI. However, your case was reviewed and seen by the neurosurgeon again today as well as neurology and the ER team and feel that you are ok for discharge home and there is no need for further treatment at this time. Thank you for allowing me to care for you today. Please return to the emergency department if you have new or worsening symptoms. Take your medications as instructed.

## 2020-06-06 NOTE — ED Notes (Signed)
Patient transported to CT 

## 2020-06-06 NOTE — ED Triage Notes (Signed)
Pt BIB EMS from home with sister. Pt. Was discharged yesterday after undergoing surgery for cerebral aneurysm. Since discharge sister Susie noted pt was 'not acting right." states she has lost bowel continence. Would leave water running. Unable to shower self. This behavior was not her normal piror to surgery. Sister states "her behavior is not right." When asked Pt reports she is here for loss of bowel continence.

## 2020-06-06 NOTE — ED Notes (Signed)
AVS reviewed with patient and patient sister Susie. Both verbalize understanding. Opportunity to ask question provided. Susie states she will be here to pick up patient in about an hour and a half.

## 2020-06-06 NOTE — ED Notes (Signed)
Patient transported to MRI 

## 2020-06-07 ENCOUNTER — Encounter (HOSPITAL_COMMUNITY): Payer: Self-pay | Admitting: Neurosurgery

## 2020-06-11 LAB — URINE CULTURE: Culture: 30000 — AB

## 2020-06-13 ENCOUNTER — Telehealth (HOSPITAL_BASED_OUTPATIENT_CLINIC_OR_DEPARTMENT_OTHER): Payer: Self-pay | Admitting: Emergency Medicine

## 2020-06-13 NOTE — Telephone Encounter (Signed)
Post ED Visit - Positive Culture Follow-up  Culture report reviewed by antimicrobial stewardship pharmacist: [facility] []  , Pharm.D. []  Enzo Bi, Pharm.D., BCPS AQ-ID []  , Pharm.D., BCPS []  Celedonio Miyamoto, Pharm.D., BCPS []  [place], Garvin Fila.D., BCPS, AAHIVP []  , Pharm.D., BCPS, AAHIVP []  Georgina Pillion, PharmD, BCPS []  , PharmD, BCPS []  [place], PharmD, BCPS [x]  [place], PharmD []  , PharmD, BCPS []  Estella Husk, PharmD  Pharmacy Team []  Lysle Pearl, PharmD []  , PharmD []  Phillips Climes, PharmD []  , Rph []  Agapito Games) , PharmD []  Joaquim Lai, PharmD []  , PharmD []  Mervyn Gay, PharmD []  , PharmD []  Vinnie Level, PharmD []  Wonda Olds, PharmD []  , PharmD []  Len Childs, PharmD   Positive urine culture No further patient follow-up is required at this time.  Janyce Ellinger 06/13/2020, 11:30 AM

## 2020-06-30 DIAGNOSIS — I609 Nontraumatic subarachnoid hemorrhage, unspecified: Secondary | ICD-10-CM | POA: Diagnosis not present

## 2020-06-30 DIAGNOSIS — I671 Cerebral aneurysm, nonruptured: Secondary | ICD-10-CM | POA: Diagnosis not present

## 2020-06-30 DIAGNOSIS — Z681 Body mass index (BMI) 19 or less, adult: Secondary | ICD-10-CM | POA: Diagnosis not present

## 2020-06-30 DIAGNOSIS — R030 Elevated blood-pressure reading, without diagnosis of hypertension: Secondary | ICD-10-CM | POA: Diagnosis not present

## 2020-07-14 DIAGNOSIS — Z0000 Encounter for general adult medical examination without abnormal findings: Secondary | ICD-10-CM | POA: Diagnosis not present

## 2020-07-14 DIAGNOSIS — R438 Other disturbances of smell and taste: Secondary | ICD-10-CM | POA: Diagnosis not present

## 2020-07-21 DIAGNOSIS — R439 Unspecified disturbances of smell and taste: Secondary | ICD-10-CM | POA: Diagnosis not present

## 2020-07-27 ENCOUNTER — Other Ambulatory Visit: Payer: Self-pay

## 2020-07-27 DIAGNOSIS — I714 Abdominal aortic aneurysm, without rupture, unspecified: Secondary | ICD-10-CM

## 2020-08-10 DIAGNOSIS — Z720 Tobacco use: Secondary | ICD-10-CM | POA: Diagnosis not present

## 2020-08-12 ENCOUNTER — Ambulatory Visit (HOSPITAL_COMMUNITY)
Admission: RE | Admit: 2020-08-12 | Discharge: 2020-08-12 | Disposition: A | Discharge status: Home / Self Care | Payer: Medicare HMO | Source: Ambulatory Visit | Attending: Vascular Surgery | Admitting: Vascular Surgery

## 2020-08-12 ENCOUNTER — Other Ambulatory Visit: Payer: Self-pay

## 2020-08-12 ENCOUNTER — Ambulatory Visit: Payer: Medicare HMO | Admitting: Vascular Surgery

## 2020-08-12 ENCOUNTER — Ambulatory Visit (INDEPENDENT_AMBULATORY_CARE_PROVIDER_SITE_OTHER): Payer: Medicare HMO | Admitting: Physician Assistant

## 2020-08-12 ENCOUNTER — Other Ambulatory Visit (HOSPITAL_COMMUNITY): Payer: Medicare HMO

## 2020-08-12 VITALS — BP 136/65 | HR 56 | Temp 97.8°F | Resp 20 | Ht 64.0 in | Wt 107.9 lb

## 2020-08-12 DIAGNOSIS — I714 Abdominal aortic aneurysm, without rupture, unspecified: Secondary | ICD-10-CM

## 2020-08-12 NOTE — Progress Notes (Signed)
HISTORY AND PHYSICAL     CC:  follow up. Requesting Provider:  Marisue IvanLinthavong, Kanhka, MD  HPI: This is a 74 y.o. female who is here today for follow up for PAD.  In march 2021, Vascular surgery was consulted for an incidental finding for a 4.6cm AAA on lumbar spine for chronic back pain.  She was in CIR at the time for treatment of an intracranial aneurysm. She had an u/s confirming AAA of 4.6cm.  She was asymptomatic and scheduled for follow up in 6 months.   The pt returns today for follow up.  She states she has been doing well.  She states she underwent another coiling of an aneurysm in her brain a couple of months ago and is doing well.  She denies any chest pain, MI or stroke.      The pt is not on a statin for cholesterol management.   She does not like to take medicine and refuses.  She states that her PCP suggested a statin and she did not want to take anymore pills.  The pt is not on an aspirin.    Other AC:  none The pt is on CCB for hypertension.  The pt does not have diabetes. Tobacco hx:  former  Pt does have family hx of AAA as her father survived ruptured AAA and she has two siblings who also have AAA.  Past Medical History:  Diagnosis Date  . AAA (abdominal aortic aneurysm) (HCC)    4.6 cm AAA 01/2020, 6 month f/u rec (Vascular surgeon: Fabienne Brunsharles Fields, MD)  . COPD (chronic obstructive pulmonary disease) (HCC)    patient denies - states she has no issues  . Degenerative joint disease of cervical spine   . GERD (gastroesophageal reflux disease)   . HLD (hyperlipidemia)   . Hypertension   . Meniere's disease in remission, bilateral   . Osteopenia   . Subarachnoid hemorrhage (HCC)   . Vertigo     Past Surgical History:  Procedure Laterality Date  . CATARACT EXTRACTION W/ INTRAOCULAR LENS  IMPLANT, BILATERAL    . EYE SURGERY    . IR ANGIO INTRA EXTRACRAN SEL INTERNAL CAROTID BILAT MOD SED  01/23/2020  . IR ANGIO INTRA EXTRACRAN SEL INTERNAL CAROTID UNI R MOD SED   06/04/2020  . IR ANGIO VERTEBRAL SEL VERTEBRAL UNI L MOD SED  01/23/2020  . IR ANGIOGRAM FOLLOW UP STUDY  01/23/2020  . IR ANGIOGRAM FOLLOW UP STUDY  01/23/2020  . IR ANGIOGRAM FOLLOW UP STUDY  01/23/2020  . IR ANGIOGRAM FOLLOW UP STUDY  01/23/2020  . IR ANGIOGRAM FOLLOW UP STUDY  06/04/2020  . IR ANGIOGRAM FOLLOW UP STUDY  06/04/2020  . IR NEURO EACH ADD'L AFTER BASIC UNI RIGHT (MS)  06/04/2020  . IR TRANSCATH/EMBOLIZ  01/23/2020  . IR TRANSCATH/EMBOLIZ  06/04/2020  . IR US GUIDE VASC ACCESS RIGHT  06/04/2020  . RADIOLOGY WITH ANESTHESIA N/A 01/23/2020   Procedure: RADIOLOGY WITH ANESTHESIA;  Surgeon: Lisbeth RenshawNundkumar, Neelesh, MD;  [facility];  Service: Radiology;  Laterality: N/A;  . RADIOLOGY WITH ANESTHESIA N/A 06/04/2020   Procedure: Coil embolization of left ACA aneurysm;  Surgeon: Lisbeth RenshawNundkumar, Neelesh, MD;  [facility];  Service: Radiology;  Laterality: N/A;  . TONSILLECTOMY      Allergies  Allergen Reactions  . Latex Hives    Current Outpatient Medications  Medication Sig Dispense Refill  . acetaminophen (TYLENOL) 325 MG tablet Take 650 mg by mouth every 6 (six) hours as needed for  headache (pain).    Marland Kitchen amLODipine (NORVASC) 2.5 MG tablet Take 1 tablet (2.5 mg total) by mouth daily. 30 tablet 0  . Calcium Carbonate (CALCIUM 600 PO) Take 600 mg by mouth at bedtime.    . Cholecalciferol 50 MCG (2000 UT) CAPS Take 2,000 Units by mouth at bedtime.     . fluticasone (FLONASE) 50 MCG/ACT nasal spray Place 2 sprays into both nostrils daily.     . fluticasone furoate-vilanterol (BREO ELLIPTA) 100-25 MCG/INH AEPB Inhale 1 puff into the lungs daily.    Marland Kitchen loratadine (CLARITIN) 10 MG tablet Take 10 mg by mouth at bedtime.     Marland Kitchen niMODipine (NIMOTOP) 30 MG capsule Take 2 capsules (60 mg total) by mouth every 4 (four) hours. Thorough 02/13/19 36 capsule 0  . pantoprazole (PROTONIX) 40 MG tablet Take 1 tablet (40 mg total) by mouth daily. 30 tablet 0  . topiramate (TOPAMAX) 25 MG tablet Take 1 tablet (25  mg total) by mouth at bedtime. 30 tablet 0   No current facility-administered medications for this visit.    Family History  Problem Relation Age of Onset  . Breast cancer Maternal Aunt     Social History   Socioeconomic History  . Marital status: Single    Spouse name: Not on file  . Number of children: Not on file  . Years of education: Not on file  . Highest education level: Not on file  Occupational History  . Not on file  Tobacco Use  . Smoking status: Former Smoker    Quit date: 01/23/2020    Years since quitting: 0.5  . Smokeless tobacco: Never Used  Vaping Use  . Vaping Use: Never used  Substance and Sexual Activity  . Alcohol use: Yes    Comment: occasional glass of wine   . Drug use: Not Currently  . Sexual activity: Not on file  Other Topics Concern  . Not on file  Social History Narrative  . Not on file   Social Determinants of Health   Financial Resource Strain:   . Difficulty of Paying Living Expenses: Not on file  Food Insecurity:   . Worried About Programme researcher, broadcasting/film/video in the Last Year: Not on file  . Ran Out of Food in the Last Year: Not on file  Transportation Needs:   . Lack of Transportation (Medical): Not on file  . Lack of Transportation (Non-Medical): Not on file  Physical Activity:   . Days of Exercise per Week: Not on file  . Minutes of Exercise per Session: Not on file  Stress:   . Feeling of Stress : Not on file  Social Connections:   . Frequency of Communication with Friends and Family: Not on file  . Frequency of Social Gatherings with Friends and Family: Not on file  . Attends Religious Services: Not on file  . Active Member of Clubs or Organizations: Not on file  . Attends Banker Meetings: Not on file  . Marital Status: Not on file  Intimate Partner Violence:   . Fear of Current or Ex-Partner: Not on file  . Emotionally Abused: Not on file  . Physically Abused: Not on file  . Sexually Abused: Not on file      REVIEW OF SYSTEMS:   [X]  denotes positive finding, [ ]  denotes negative finding Cardiac  Comments:  Chest pain or chest pressure:    Shortness of breath upon exertion:    Short of breath when lying flat:  Irregular heart rhythm:        Vascular    Pain in calf, thigh, or hip brought on by ambulation:    Pain in feet at night that wakes you up from your sleep:     Blood clot in your veins:    Leg swelling:         Pulmonary    Oxygen at home:    Productive cough:     Wheezing:         Neurologic    Sudden weakness in arms or legs:     Sudden numbness in arms or legs:     Sudden onset of difficulty speaking or slurred speech:    Temporary loss of vision in one eye:     Problems with dizziness:         Gastrointestinal    Blood in stool:     Vomited blood:         Genitourinary    Burning when urinating:     Blood in urine:        Psychiatric    Major depression:         Hematologic    Bleeding problems:    Problems with blood clotting too easily:        Skin    Rashes or ulcers:        Constitutional    Fever or chills:      PHYSICAL EXAMINATION:  Today's Vitals   08/12/20 0827  BP: 136/65  Pulse: (!) 56  Resp: 20  Temp: 97.8 F (36.6 C)  TempSrc: Temporal  SpO2: 100%  Weight: 107 lb 14.4 oz (48.9 kg)  Height: 5\' 4"  (1.626 m)   Body mass index is 18.52 kg/m.   General:  WDWN in NAD; vital signs documented above Gait: Normal  HENT: WNL, normocephalic Pulmonary: normal non-labored breathing , without wheezing Cardiac: regular HR, without  Murmur; without carotid bruits Abdomen: soft, NT, no masses; aortic pulse is palpable Skin: without rashes Vascular Exam/Pulses:  Right Left  Radial trace 2+ (normal)  Ulnar Unable to palpate 2+ (normal)  Femoral 2+ (normal) 2+ (normal)  Popliteal Unable to palpate Unable to palpate  DP 2+ (normal) 2+ (normal)  PT Unable to palpate Unable to palpate   Extremities: without ischemic changes,  without Gangrene , without cellulitis; without open wounds;  Musculoskeletal: no muscle wasting or atrophy  Neurologic: A&O X 3;  No focal weakness or paresthesias are detected Psychiatric:  The pt has Normal affect.   Non-Invasive Vascular Imaging:   AAA Arterial duplex on 08/12/2020: Summary:  Abdominal Aorta: There is evidence of abnormal dilatation of the distal  Abdominal aorta. The largest aortic measurement is 4.3 cm. Previous  diameter measurement was 4.6 cm obtained on CT 02/05/2020.  Previous AAA u/s 02/05/2020: Distal abdominal aortic aneurysm with maximum transverse diameter of4.2 x 4.6 cm. Aneurysm extends over approximately 4.6 cm. Peripheral thrombus noted in the area of aneurysm  ASSESSMENT/PLAN:: 74 y.o. female here for follow up for incidental finding of AAA earlier this year measuring 4.6cm.   -pt AAA today measures 4.3cm. she remains asymptomatic.  She does have family hx with her father surviving rupture AAA and she has two siblings that also have AAA.  She has two other siblings and we talked about them getting tested and they have been. -she does not want to take a statin as she does not want to take anymore pills.  She also does not want to  take an aspirin.   She is on amlodipine for hypertension.  I discussed the importance of good blood pressure control in setting of AAA.  She expressed understanding. -discussed s/s of ruptured AAA and if she develops any of these, she should call 911 and get to [facility] ER as soon as possible.  -will have her f/u in 6 months with AAA duplex.  Given her hx of aneurysms personally and her family hx, will also get duplex of popliteal arteries to check for popliteal aneurysms.  Discussed with pt and she is in agreement.  If AAA stable in 6 months, we can go to yearly if AAA stays below 4.5cm.   Doreatha Massed, [facility] [phone]  Clinic MD:   Darrick Penna

## 2020-08-17 ENCOUNTER — Other Ambulatory Visit: Payer: Self-pay | Admitting: *Deleted

## 2020-08-17 DIAGNOSIS — I714 Abdominal aortic aneurysm, without rupture, unspecified: Secondary | ICD-10-CM

## 2020-08-17 DIAGNOSIS — M79606 Pain in leg, unspecified: Secondary | ICD-10-CM

## 2020-09-08 DIAGNOSIS — I671 Cerebral aneurysm, nonruptured: Secondary | ICD-10-CM | POA: Diagnosis not present

## 2020-09-08 DIAGNOSIS — I609 Nontraumatic subarachnoid hemorrhage, unspecified: Secondary | ICD-10-CM | POA: Diagnosis not present

## 2020-10-12 DIAGNOSIS — E782 Mixed hyperlipidemia: Secondary | ICD-10-CM | POA: Diagnosis not present

## 2020-10-19 DIAGNOSIS — F17210 Nicotine dependence, cigarettes, uncomplicated: Secondary | ICD-10-CM | POA: Diagnosis not present

## 2020-10-19 DIAGNOSIS — E782 Mixed hyperlipidemia: Secondary | ICD-10-CM | POA: Diagnosis not present

## 2020-10-19 DIAGNOSIS — J438 Other emphysema: Secondary | ICD-10-CM | POA: Diagnosis not present

## 2020-12-23 ENCOUNTER — Encounter: Payer: Medicare HMO | Attending: Family Medicine | Admitting: Dietician

## 2020-12-23 ENCOUNTER — Other Ambulatory Visit: Payer: Self-pay

## 2020-12-23 VITALS — Ht 64.0 in | Wt 105.5 lb

## 2020-12-23 DIAGNOSIS — Z720 Tobacco use: Secondary | ICD-10-CM | POA: Insufficient documentation

## 2020-12-23 DIAGNOSIS — E782 Mixed hyperlipidemia: Secondary | ICD-10-CM | POA: Diagnosis not present

## 2020-12-23 NOTE — Progress Notes (Signed)
Medical Nutrition Therapy: Visit start time: 1500  end time: 1600  Assessment:  Diagnosis: hyperlipidemia, mixed Past medical history: HTN Psychosocial issues/ stress concerns: none  Preferred learning method:  . Auditory . Visual . Hands-on   Current weight: 105.5lbs Height: 5'4" Medications, supplements: reconciled list in medical record  Progress and evaluation:   Patient reports she recently lost about 20lbs during hospital stay with brain aneurysm. She is struggling to regain the weight; her personal goal is a weight of 125-130lbs.   She states she has stopped BP meds since recovering from brain aneurysm surgery.   Recent labwork (10/19/20) indicates total cholesterol of 247, LDL 171, HDL 47.5, and triglycerides 144.   She reports being a meat-eater, and eats large portions with her supper meal. She does not eat breakfast, states she has never been able to eat in mornings, taking after her father.   Working on weight gain, lowering cholesterol, and managing BP all at once is overwhelming to patient.     Physical activity: balance exercises, leg and arm strengthening 30-45 minutes 5 times a week at gym  Dietary Intake:  Usual eating pattern includes 2 meals and 2 snacks per day. Dining out frequency: 1 meals per week.  Breakfast: none; unable to eat in am Snack: premier protein shake before going to gym at 11:30 Lunch: 1-1:30pm -- toast, sausage; oatmeal 1/2 banana Snack: none Supper: 6:30-7pm -- likes meats pork, occ beef roast or burger but now reduced; fish/ shrimp/ occ chicken + potato/ creamed corn/ occ green beans. Not in habit of eating much veg, mostly meat Snack: strawberries, yogurt, pudding, pastry (wants sweets in evenings) Beverages: 5-7 bottles water, coffee with creamer  Nutrition Care Education: Topics covered:  Basic nutrition: basic food groups, appropriate nutrient balance, appropriate meal and snack schedule, general nutrition guidelines    Weight  gain: choosing foods with healthy fats to boost calorie density; eating at regular intervals Hypertension:  identifying goal for sodium itntake, identifying food sources of potassium, magnesium Hyperlipidemia:  healthy and unhealthy fats, role of fiber especially soluble fiber, plant sterols and stanols, role of exercise Other: making gradual changes, a few at a time, to avoid fleeing overwhelmed.   Nutritional Diagnosis:  [place]-2.2 Altered nutrition-related laboratory As related to hyperlipidemia.  As evidenced by elevated total cholesterol and LDL. [place]-3.2 Unintentional weight loss As related to recent extended hospital stay for brain aneurysm and surgery.  As evidenced by patient with current BMI of 18.  Intervention:  . Instruction and discussion as noted above. . Patient is unsure if she can choose lower fat foods due to struggle with weight gain. Discussed limiting unhealthy fats and choosing foods with healthy fats. . Established goals for change with direction from patient, to meet her primary concern of regaining weight while decreasing total and LDL cholesterol.   Education Materials given:  . General diet guidelines for Cholesterol-lowering/ Heart health . Foods to Choose to Lower Cholesterol . Gaining Weight in a Healthy Way . Visit summary with goals/ instructions   Learner/ who was taught:  . Patient   Level of understanding: Marland Kitchen Verbalizes/ demonstrates competency   Demonstrated degree of understanding via:   Teach back Learning barriers: . None  Willingness to learn/ readiness for change: . Eager, change in progress -- small incremental changes  Monitoring and Evaluation:  Dietary intake, exercise, blood lipids, and body weight      follow up: 01/31/21 at 1:15pm

## 2020-12-23 NOTE — Patient Instructions (Signed)
   Goal for sodium is averaging 400-600mg  each meal (or less). Daily goal is 1500-2000mg .   Choose foods low in saturated fat to help lower cholesterol Lean pork products-- tenderloin, loin, chops (trim fat on edges). Lean beef -- loin, flank, round, 90% (or higher%) lean ground meat.  Choose foods with mostly unsaturated fats to help boost calories: nuts, peanut butter, salad dressing, vegetable oils (canola, olive, corn, soybean, peanut). Try sauteeing vegetables or potatoes in a little oil, or coat potatoes or veggies in oil, then roast on a pan at 400-425F for 15-20 minutes.   Increase vegetables and fruits with meals and/or snacks. They help lower cholesterol and blood pressure.

## 2020-12-29 DIAGNOSIS — I609 Nontraumatic subarachnoid hemorrhage, unspecified: Secondary | ICD-10-CM | POA: Diagnosis not present

## 2020-12-29 DIAGNOSIS — I671 Cerebral aneurysm, nonruptured: Secondary | ICD-10-CM | POA: Diagnosis not present

## 2020-12-29 DIAGNOSIS — I10 Essential (primary) hypertension: Secondary | ICD-10-CM | POA: Diagnosis not present

## 2021-01-19 DIAGNOSIS — I671 Cerebral aneurysm, nonruptured: Secondary | ICD-10-CM | POA: Diagnosis not present

## 2021-01-31 ENCOUNTER — Encounter: Payer: Medicare HMO | Attending: Family Medicine | Admitting: Dietician

## 2021-01-31 ENCOUNTER — Encounter: Payer: Self-pay | Admitting: Dietician

## 2021-01-31 ENCOUNTER — Other Ambulatory Visit: Payer: Self-pay

## 2021-01-31 VITALS — Ht 64.0 in | Wt 105.1 lb

## 2021-01-31 DIAGNOSIS — E782 Mixed hyperlipidemia: Secondary | ICD-10-CM | POA: Diagnosis not present

## 2021-01-31 NOTE — Progress Notes (Signed)
Medical Nutrition Therapy: Visit start time: 1315  end time: 1345  Assessment:  Diagnosis: hyperlipidemia Medical history changes: no changes Psychosocial issues/ stress concerns: none  Current weight: 105.1lbs Height: 5'4" BMI: 18.04 Medications, supplement changes: no changes  Progress and evaluation:  . Patient reports following diet recommendations (low saturated fat) since previous visit, states she has lost 5 more lbs. In-office weight measurement is within 1lb from previous visit on 12/23/20. . Reports she has become sensitive to salty taste in foods, so many convenience foods do not work.  . She states she often does not know what she wants to eat, effort/ stress with meal preparation sometimes decreases appetite.     Physical activity: gym weight training, balance 3-4 times a week  Dietary Intake:  Usual eating pattern includes 2 meals and 1-2 snacks per day. Dining out frequency: not assessed today.  Breakfast: none, not hungry Snack: premier protein shake Lunch: toast, sausage; oatmeal + 1/2 banana Snack: none Supper: lean meat + potatoes/ corn/ rice/ pasta; occ pot pie Snack: strawberries and yogurt or pudding Beverages: 5-7 bottles of water, coffee with creamer  Nutrition Care Education: Topics covered:      Weight gain: reviewed progress since previous visit; discussed options to boost calories in foods including nuts, oils, margarine, mayo, salad dressings, gravies made without animal fat; adding a nutrition shake such as Ensure or Boost or breakfast drink for nighttime snack Advanced nutrition: convenient foods low in saturated fat and sodium, batch cooking and freezing portions for future meals Hyperlipidemia:  target goals for lipids; reviewed healthy and unhealthy fats   Nutritional Diagnosis:  [place]-2.2 Altered nutrition-related laboratory As related to hyperlipidemia.  As evidenced by elevated total and LDL cholesterol. [place]-3.4 Unintentional weight gain As related  to recent extended hospital stay for brain aneurysm and surgery.  As evidenced by patient with current BMI of 18.  Intervention:  . Instruction and discussion as noted above. . Patient continues to struggle to gain weight.  . Established goals to promote weight gain while lowering cholesterol  Education Materials given:  Marland Kitchen Visit summary with goals/ instructions   Learner/ who was taught:  . Patient   Level of understanding: Marland Kitchen Verbalizes/ demonstrates competency  Demonstrated degree of understanding via:   Teach back Learning barriers: . None  Willingness to learn/ readiness for change: . Eager, change in progress  Monitoring and Evaluation:  Dietary intake, exercise, blood lipids, BP control, and body weight      follow up: 03/03/21 at 2:45pm

## 2021-01-31 NOTE — Patient Instructions (Addendum)
   Try a higher calorie breakfast drink such as Ensure or [employer] -- have it for a nighttime snack. This would add 200-350 calories to the day.  Include 1/4 cup of nuts with oatmeal, pudding or yogurt. This would add 100-200 calories.   Make food convenient, keep using low sodium options. [place] brand potatoes makes a low sodium version. Use pre-cooked frozen meats and veg. Or cook extra and freeze individual portions for convenient meals later.   Add vegetable oil, salad dressing, margarine, or mayonnaise to foods to add calories from healthy fats. Use a cream soup (low fat is ok) as a sauce to add moisture and flavor to meat, rice, and pastas.  Most recent cholesterol results (10/19/20)   Total cholesterol 247 (goal <200), LDL (bad cholesterol) 171, (goal <100), HDL (good cholesterol) 47.5  (goal 45 or more), and Triglycerides 144 (goal 150 or less)

## 2021-02-04 ENCOUNTER — Other Ambulatory Visit: Payer: Self-pay | Admitting: *Deleted

## 2021-02-04 DIAGNOSIS — M79606 Pain in leg, unspecified: Secondary | ICD-10-CM

## 2021-02-04 DIAGNOSIS — H6592 Unspecified nonsuppurative otitis media, left ear: Secondary | ICD-10-CM | POA: Diagnosis not present

## 2021-02-04 DIAGNOSIS — I714 Abdominal aortic aneurysm, without rupture, unspecified: Secondary | ICD-10-CM

## 2021-02-09 ENCOUNTER — Ambulatory Visit (HOSPITAL_COMMUNITY): Payer: Medicare HMO

## 2021-02-09 ENCOUNTER — Ambulatory Visit: Payer: Medicare HMO

## 2021-02-10 DIAGNOSIS — Z8679 Personal history of other diseases of the circulatory system: Secondary | ICD-10-CM | POA: Diagnosis not present

## 2021-02-10 DIAGNOSIS — Z720 Tobacco use: Secondary | ICD-10-CM | POA: Diagnosis not present

## 2021-02-10 DIAGNOSIS — E782 Mixed hyperlipidemia: Secondary | ICD-10-CM | POA: Diagnosis not present

## 2021-02-14 DIAGNOSIS — H6692 Otitis media, unspecified, left ear: Secondary | ICD-10-CM | POA: Diagnosis not present

## 2021-02-14 DIAGNOSIS — H9202 Otalgia, left ear: Secondary | ICD-10-CM | POA: Diagnosis not present

## 2021-02-14 DIAGNOSIS — H9222 Otorrhagia, left ear: Secondary | ICD-10-CM | POA: Diagnosis not present

## 2021-02-15 DIAGNOSIS — H60532 Acute contact otitis externa, left ear: Secondary | ICD-10-CM | POA: Diagnosis not present

## 2021-02-15 DIAGNOSIS — H61122 Hematoma of pinna, left ear: Secondary | ICD-10-CM | POA: Diagnosis not present

## 2021-02-17 DIAGNOSIS — J438 Other emphysema: Secondary | ICD-10-CM | POA: Diagnosis not present

## 2021-02-17 DIAGNOSIS — Z720 Tobacco use: Secondary | ICD-10-CM | POA: Diagnosis not present

## 2021-02-17 DIAGNOSIS — E782 Mixed hyperlipidemia: Secondary | ICD-10-CM | POA: Diagnosis not present

## 2021-02-23 ENCOUNTER — Ambulatory Visit (INDEPENDENT_AMBULATORY_CARE_PROVIDER_SITE_OTHER)
Admission: RE | Admit: 2021-02-23 | Discharge: 2021-02-23 | Disposition: A | Discharge status: Home / Self Care | Payer: Medicare HMO | Source: Ambulatory Visit | Attending: Vascular Surgery | Admitting: Vascular Surgery

## 2021-02-23 ENCOUNTER — Ambulatory Visit (HOSPITAL_COMMUNITY)
Admission: RE | Admit: 2021-02-23 | Discharge: 2021-02-23 | Disposition: A | Discharge status: Home / Self Care | Payer: Medicare HMO | Source: Ambulatory Visit | Attending: Vascular Surgery | Admitting: Vascular Surgery

## 2021-02-23 ENCOUNTER — Other Ambulatory Visit: Payer: Self-pay

## 2021-02-23 ENCOUNTER — Ambulatory Visit (INDEPENDENT_AMBULATORY_CARE_PROVIDER_SITE_OTHER): Payer: Medicare HMO | Admitting: Physician Assistant

## 2021-02-23 ENCOUNTER — Other Ambulatory Visit: Payer: Self-pay | Admitting: Family Medicine

## 2021-02-23 VITALS — BP 133/73 | HR 63 | Temp 97.9°F | Resp 20 | Ht 64.0 in | Wt 104.1 lb

## 2021-02-23 DIAGNOSIS — I714 Abdominal aortic aneurysm, without rupture, unspecified: Secondary | ICD-10-CM

## 2021-02-23 DIAGNOSIS — M79606 Pain in leg, unspecified: Secondary | ICD-10-CM | POA: Insufficient documentation

## 2021-02-23 DIAGNOSIS — Z1231 Encounter for screening mammogram for malignant neoplasm of breast: Secondary | ICD-10-CM

## 2021-02-23 NOTE — Progress Notes (Signed)
Office Note     CC:  follow up Requesting Provider:  Marisue Ivan, MD  HPI: Autumn Le is a 75 y.o. (July 30, 1946) female who presents for surveillance of abdominal aortic aneurysm.  She was seen 6 months ago and at that time AAA measured 4.3 cm based on ultrasound.  She denies any new or changing abdominal or back pain.  She denies any claudication, rest pain, or nonhealing wounds of bilateral lower extremities.  She is followed regularly by her PCP for management of hyperlipidemia and hypertension.  She is an everyday tobacco user and smokes half a pack a day with no interest in quitting.  She is also followed by Dr. Conchita Paris for cerebral artery aneurysms.  In the past she has had a cerebral artery aneurysm that has ruptured and also had another aneurysm coiled.  Patient denies any postprandial pain, fear of food, or weight loss.   Past Medical History:  Diagnosis Date  . AAA (abdominal aortic aneurysm) (HCC)    4.6 cm AAA 01/2020, 6 month f/u rec (Vascular surgeon: Fabienne Bruns, MD)  . COPD (chronic obstructive pulmonary disease) (HCC)    patient denies - states she has no issues  . Degenerative joint disease of cervical spine   . GERD (gastroesophageal reflux disease)   . HLD (hyperlipidemia)   . Hypertension   . Meniere's disease in remission, bilateral   . Osteopenia   . Subarachnoid hemorrhage (HCC)   . Vertigo     Past Surgical History:  Procedure Laterality Date  . CATARACT EXTRACTION W/ INTRAOCULAR LENS  IMPLANT, BILATERAL    . EYE SURGERY    . IR ANGIO INTRA EXTRACRAN SEL INTERNAL CAROTID BILAT MOD SED  01/23/2020  . IR ANGIO INTRA EXTRACRAN SEL INTERNAL CAROTID UNI R MOD SED  06/04/2020  . IR ANGIO VERTEBRAL SEL VERTEBRAL UNI L MOD SED  01/23/2020  . IR ANGIOGRAM FOLLOW UP STUDY  01/23/2020  . IR ANGIOGRAM FOLLOW UP STUDY  01/23/2020  . IR ANGIOGRAM FOLLOW UP STUDY  01/23/2020  . IR ANGIOGRAM FOLLOW UP STUDY  01/23/2020  . IR ANGIOGRAM FOLLOW UP STUDY   06/04/2020  . IR ANGIOGRAM FOLLOW UP STUDY  06/04/2020  . IR NEURO EACH ADD'L AFTER BASIC UNI RIGHT (MS)  06/04/2020  . IR TRANSCATH/EMBOLIZ  01/23/2020  . IR TRANSCATH/EMBOLIZ  06/04/2020  . IR US GUIDE VASC ACCESS RIGHT  06/04/2020  . RADIOLOGY WITH ANESTHESIA N/A 01/23/2020   Procedure: RADIOLOGY WITH ANESTHESIA;  Surgeon: Lisbeth Renshaw, MD;  Location: [facility] OR;  Service: Radiology;  Laterality: N/A;  . RADIOLOGY WITH ANESTHESIA N/A 06/04/2020   Procedure: Coil embolization of left ACA aneurysm;  Surgeon: Lisbeth Renshaw, MD;  Location: [facility] OR;  Service: Radiology;  Laterality: N/A;  . TONSILLECTOMY      Social History   Socioeconomic History  . Marital status: Single    Spouse name: Not on file  . Number of children: Not on file  . Years of education: Not on file  . Highest education level: Not on file  Occupational History  . Not on file  Tobacco Use  . Smoking status: Former Smoker    Packs/day: 1.00    Types: Cigarettes    Quit date: 01/23/2020    Years since quitting: 1.0  . Smokeless tobacco: Never Used  Vaping Use  . Vaping Use: Never used  Substance and Sexual Activity  . Alcohol use: Yes    Comment: occasional glass of wine   . Drug use:  Not Currently  . Sexual activity: Not on file  Other Topics Concern  . Not on file  Social History Narrative  . Not on file   Social Determinants of Health   Financial Resource Strain: Not on file  Food Insecurity: Not on file  Transportation Needs: Not on file  Physical Activity: Not on file  Stress: Not on file  Social Connections: Not on file  Intimate Partner Violence: Not on file    Family History  Problem Relation Age of Onset  . Breast cancer Maternal Aunt     Current Outpatient Medications  Medication Sig Dispense Refill  . acetaminophen (TYLENOL) 325 MG tablet Take 650 mg by mouth every 6 (six) hours as needed for headache (pain).    . Calcium Carbonate (CALCIUM 600 PO) Take 600 mg by mouth at bedtime.     . Cholecalciferol 50 MCG (2000 UT) CAPS Take 2,000 Units by mouth at bedtime.     . Coenzyme Q10 (CO Q 10 PO) Take by mouth.    . ezetimibe (ZETIA) 10 MG tablet Take by mouth.    . fluticasone (FLONASE) 50 MCG/ACT nasal spray Place 2 sprays into both nostrils daily.    . fluticasone furoate-vilanterol (BREO ELLIPTA) 100-25 MCG/INH AEPB Inhale 1 puff into the lungs daily.    Marland Kitchen loratadine (CLARITIN) 10 MG tablet Take 10 mg by mouth at bedtime.    . Red Yeast Rice Extract 600 MG CAPS Take by mouth.     No current facility-administered medications for this visit.    Allergies  Allergen Reactions  . Latex Hives     REVIEW OF SYSTEMS:   [X]  denotes positive finding, [ ]  denotes negative finding Cardiac  Comments:  Chest pain or chest pressure:    Shortness of breath upon exertion:    Short of breath when lying flat:    Irregular heart rhythm:        Vascular    Pain in calf, thigh, or hip brought on by ambulation:    Pain in feet at night that wakes you up from your sleep:     Blood clot in your veins:    Leg swelling:         Pulmonary    Oxygen at home:    Productive cough:     Wheezing:         Neurologic    Sudden weakness in arms or legs:     Sudden numbness in arms or legs:     Sudden onset of difficulty speaking or slurred speech:    Temporary loss of vision in one eye:     Problems with dizziness:         Gastrointestinal    Blood in stool:     Vomited blood:         Genitourinary    Burning when urinating:     Blood in urine:        Psychiatric    Major depression:         Hematologic    Bleeding problems:    Problems with blood clotting too easily:        Skin    Rashes or ulcers:        Constitutional    Fever or chills:      PHYSICAL EXAMINATION:  Vitals:   02/23/21 0838  BP: 133/73  Pulse: 63  Resp: 20  Temp: 97.9 F (36.6 C)  TempSrc: Temporal  SpO2: 98%  Weight: 104  lb 1.6 oz (47.2 kg)  Height: 5\' 4"  (1.626 m)    General:   WDWN in NAD; vital signs documented above Gait: Not observed HENT: WNL, normocephalic Pulmonary: normal non-labored breathing , without Rales, rhonchi,  wheezing Cardiac: regular HR Abdomen: soft, NT, no masses Skin: without rashes Vascular Exam/Pulses:  Right Left  Radial 2+ (normal) 2+ (normal)  DP 2+ (normal) 2+ (normal)   Extremities: without ischemic changes, without Gangrene , without cellulitis; without open wounds;  Musculoskeletal: no muscle wasting or atrophy  Neurologic: A&O X 3;  No focal weakness or paresthesias are detected Psychiatric:  The pt has Normal affect.   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

## 2021-03-01 DIAGNOSIS — H6522 Chronic serous otitis media, left ear: Secondary | ICD-10-CM | POA: Diagnosis not present

## 2021-03-01 DIAGNOSIS — H6982 Other specified disorders of Eustachian tube, left ear: Secondary | ICD-10-CM | POA: Diagnosis not present

## 2021-03-03 ENCOUNTER — Encounter: Payer: Self-pay | Admitting: Dietician

## 2021-03-03 ENCOUNTER — Other Ambulatory Visit: Payer: Self-pay

## 2021-03-03 ENCOUNTER — Encounter: Payer: Medicare HMO | Attending: Family Medicine | Admitting: Dietician

## 2021-03-03 VITALS — Ht 64.0 in | Wt 102.8 lb

## 2021-03-03 DIAGNOSIS — E782 Mixed hyperlipidemia: Secondary | ICD-10-CM | POA: Insufficient documentation

## 2021-03-03 NOTE — Patient Instructions (Addendum)
   Eat healthy fats with meals like salad dressings, mayonnaise, vegetable oils, peanut butter or nuts. Even 1 Tablespoon can add 50-100 calories.   Try drinking a few swallows of water if a food starts to taste too salty. Or "fix" the food with salt free broth, water, something creamy like sour cream or yogurt, or lemon juice or vinegar, or a little bit of sugar.   Keep working to figure out a cause of the salty taste

## 2021-03-03 NOTE — Progress Notes (Signed)
Medical Nutrition Therapy: Visit start time: 1320  end time: 1400  Assessment:  Diagnosis: hyperlipidemia, mixed Medical history changes: no changes Psychosocial issues/ stress concerns: none  Current weight: 102.8lbs Height: 5'4" BMI: 17.65 Medications, supplement changes: no changes per patient  Progress and evaluation:  . Weight has decreased further by 2.3lbs since 01/31/21 visit. . Patient feels she is unable to consume enough calories for weight gain due to reduction in animal foods/ fats. She also reports ongoing excessive salty taste from many foods. She cooked a beef roast which she was unable to eat because it tasted too salty. She has been to ENT without finding any cause.  Marland Kitchen Recent labwork (02/10/21) indicates improvement in blood lipids with total cholesterol of 196, LDL 115, HDL 41.2, Triglycerides 199. Marland Kitchen She has resumed buying full fat dairy products to increase her caloric intake.  . Patient reaffirms that she is unable to add more meals or snacks in her day as she is not hungry.    Physical activity: gym weight training, balance 3-4 times a week  Dietary Intake:  Usual eating pattern includes 3 meals and 0-1 snacks per day. Dining out frequency: not assessed today.  Breakfast: ensure or premier protein shake Snack: none Lunch: toast, sausage; oatmeal + 1/2 banana Snack: protein shake Supper: lean meat pork chop or roast + potatoes/ corn/ rice/ pasta Snack: sometimes protein shake; strawberries and yogurt or pudding Beverages: water, coffee with creamer  Nutrition Care Education: Topics covered:  Basic nutrition: reviewed appropriate meal and snack schedule, general nutrition guidelines    Weight gain: reviewed progress since previous visit; reviewed options for healthy fats to include with meals ie oils, mayo, salad dressings, nuts and nut butters; calculated calorie needs for weight gain at 1600-1800kcal per patient request; provided sample 1800kcal  menus Hyperlipidemia:  target goals for lipids; reviewed healthy and unhealthy fats Other:  Possible causes of salty taste sensitivity; ways to mask or dilute salty taste in foods; encouraged drinking water when eating salty tasting foods.  Nutritional Diagnosis:  [place]-2.2 Altered nutrition-related laboratory As related to hyperlipidemia.  As evidenced by elevated total and LDL cholesterol. [place]-3.2 Unintentional weight loss As related to weight loss during and after hospital stay for brain aneurysm and surgery.  As evidenced by patient with current BMI of 17.65.  Intervention:  . Instruction and discussion as noted above. . Reviewed lab results with patient; unable to find evidence of electrolyte imbalance as possible cause of salty taste sensitivity. Marland Kitchen Updated nutrition goals with input from patient.  . No additional MNT follow up scheduled; patient to call with any questions or concerns.  Education Materials given:  . 1800 calorie balanced menus . 02/10/21 lipid panel results per patient request . Visit summary with goals/ instructions   Learner/ who was taught:  . Patient   Level of understanding: Marland Kitchen Verbalizes/ demonstrates competency   Demonstrated degree of understanding via:   Teach back Learning barriers: . None  Willingness to learn/ readiness for change: . Eager, change in progress   Monitoring and Evaluation:  Dietary intake, exercise, blood lipids, and body weight      follow up: prn

## 2021-03-04 ENCOUNTER — Ambulatory Visit
Admission: RE | Admit: 2021-03-04 | Discharge: 2021-03-04 | Disposition: A | Discharge status: Home / Self Care | Payer: Medicare HMO | Source: Ambulatory Visit | Attending: Family Medicine | Admitting: Family Medicine

## 2021-03-04 DIAGNOSIS — Z1231 Encounter for screening mammogram for malignant neoplasm of breast: Secondary | ICD-10-CM | POA: Insufficient documentation

## 2021-03-08 ENCOUNTER — Other Ambulatory Visit: Payer: Self-pay | Admitting: Family Medicine

## 2021-03-08 DIAGNOSIS — R928 Other abnormal and inconclusive findings on diagnostic imaging of breast: Secondary | ICD-10-CM

## 2021-03-08 DIAGNOSIS — N6310 Unspecified lump in the right breast, unspecified quadrant: Secondary | ICD-10-CM

## 2021-03-23 DIAGNOSIS — H6982 Other specified disorders of Eustachian tube, left ear: Secondary | ICD-10-CM | POA: Diagnosis not present

## 2021-03-23 DIAGNOSIS — H6522 Chronic serous otitis media, left ear: Secondary | ICD-10-CM | POA: Diagnosis not present

## 2021-03-23 DIAGNOSIS — H903 Sensorineural hearing loss, bilateral: Secondary | ICD-10-CM | POA: Diagnosis not present

## 2021-03-25 NOTE — Discharge Instructions (Signed)
[facility] °DISCHARGE INSTRUCTIONS FOR MYRINGOTOMY AND TUBE INSERTION ° °[facility] °PAUL JUENGEL, M.D. ° °Diet:   After surgery, the patient should take only liquids and foods as tolerated.  The patient may then have a regular diet after the effects of anesthesia have worn off, usually about four to six hours after surgery. ° °Activities:   The patient should rest until the effects of anesthesia have worn off.  After this, there are no restrictions on the normal daily activities. ° °Medications:   You will be given antibiotic drops to be used in the ears postoperatively.  It is recommended to use 3 drops 3 times a day for 3 days, then the drops should be saved for possible future use. ° °The tubes should not cause any discomfort to the patient, but if there is any question, Tylenol should be given according to the instructions for the age of the patient. ° °Other medications should be continued normally. ° °Precautions:   Should there be recurrent drainage after the tubes are placed, the drops should be used for approximately 3-4 days.  If it does not clear, you should call the ENT office. ° °Earplugs:   Earplugs are only needed for those who are going to be submerged under water.  When taking a bath or shower and using a cup or showerhead to rinse hair, it is not necessary to wear earplugs.  These come in a variety of fashions, all of which can be obtained at our office.  However, if one is not able to come by the office, then silicone plugs can be found at most pharmacies.  It is not advised to stick anything in the ear that is not approved as an earplug.  Silly putty is not to be used as an earplug.  Swimming is allowed in patients after ear tubes are inserted, however, they must wear earplugs if they are going to be submerged under water.  For those children who are going to be swimming a lot, it is recommended to use a fitted ear mold, which can be made by our audiologist.   If discharge is noticed from the ears, this most likely represents an ear infection.  We would recommend getting your eardrops and using them as indicated above.  If it does not clear, then you should call the ENT office.  For follow up, the patient should return to the ENT office three weeks postoperatively and then every six months as required by the doctor. ° ° °General Anesthesia, Adult, Care After °This sheet gives you information about how to care for yourself after your procedure. Your health care provider may also give you more specific instructions. If you have problems or questions, contact your health care provider. °What can I expect after the procedure? °After the procedure, the following side effects are common: °· Pain or discomfort at the IV site. °· Nausea. °· Vomiting. °· Sore throat. °· Trouble concentrating. °· Feeling cold or chills. °· Feeling weak or tired. °· Sleepiness and fatigue. °· Soreness and body aches. These side effects can affect parts of the body that were not involved in surgery. °Follow these instructions at home: °For the time period you were told by your health care provider: °· Rest. °· Do not participate in activities where you could fall or become injured. °· Do not drive or use machinery. °· Do not drink alcohol. °· Do not take sleeping pills or medicines that cause drowsiness. °· Do   not make important decisions or sign legal documents. °· Do not take care of children on your own.   °Eating and drinking °· Follow any instructions from your health care provider about eating or drinking restrictions. °· When you feel hungry, start by eating small amounts of foods that are soft and easy to digest (bland), such as toast. Gradually return to your regular diet. °· Drink enough fluid to keep your urine pale yellow. °· If you vomit, rehydrate by drinking water, juice, or clear broth. °General instructions °· If you have sleep apnea, surgery and certain medicines can increase your  risk for breathing problems. Follow instructions from your health care provider about wearing your sleep device: °? Anytime you are sleeping, including during daytime naps. °? While taking prescription pain medicines, sleeping medicines, or medicines that make you drowsy. °· Have a responsible adult stay with you for the time you are told. It is important to have someone help care for you until you are awake and alert. °· Return to your normal activities as told by your health care provider. Ask your health care provider what activities are safe for you. °· Take over-the-counter and prescription medicines only as told by your health care provider. °· If you smoke, do not smoke without supervision. °· Keep all follow-up visits as told by your health care provider. This is important. °Contact a health care provider if: °· You have nausea or vomiting that does not get better with medicine. °· You cannot eat or drink without vomiting. °· You have pain that does not get better with medicine. °· You are unable to pass urine. °· You develop a skin rash. °· You have a fever. °· You have redness around your IV site that gets worse. °Get help right away if: °· You have difficulty breathing. °· You have chest pain. °· You have blood in your urine or stool, or you vomit blood. °Summary °· After the procedure, it is common to have a sore throat or nausea. It is also common to feel tired. °· Have a responsible adult stay with you for the time you are told. It is important to have someone help care for you until you are awake and alert. °· When you feel hungry, start by eating small amounts of foods that are soft and easy to digest (bland), such as toast. Gradually return to your regular diet. °· Drink enough fluid to keep your urine pale yellow. °· Return to your normal activities as told by your health care provider. Ask your health care provider what activities are safe for you. °This information is not intended to replace  advice given to you by your health care provider. Make sure you discuss any questions you have with your health care provider. °Document Revised: 07/15/2020 Document Reviewed: 02/12/2020 °Elsevier Patient Education © 2021 [employer] ° °

## 2021-03-28 ENCOUNTER — Other Ambulatory Visit: Payer: Self-pay

## 2021-03-28 ENCOUNTER — Ambulatory Visit
Admission: RE | Admit: 2021-03-28 | Discharge: 2021-03-28 | Disposition: A | Discharge status: Home / Self Care | Payer: Medicare HMO | Source: Ambulatory Visit | Attending: Family Medicine | Admitting: Family Medicine

## 2021-03-28 DIAGNOSIS — N6310 Unspecified lump in the right breast, unspecified quadrant: Secondary | ICD-10-CM | POA: Insufficient documentation

## 2021-03-28 DIAGNOSIS — R928 Other abnormal and inconclusive findings on diagnostic imaging of breast: Secondary | ICD-10-CM | POA: Diagnosis not present

## 2021-03-28 DIAGNOSIS — N6311 Unspecified lump in the right breast, upper outer quadrant: Secondary | ICD-10-CM | POA: Diagnosis not present

## 2021-03-29 DIAGNOSIS — J40 Bronchitis, not specified as acute or chronic: Secondary | ICD-10-CM | POA: Diagnosis not present

## 2021-03-29 DIAGNOSIS — R059 Cough, unspecified: Secondary | ICD-10-CM | POA: Diagnosis not present

## 2021-03-29 DIAGNOSIS — R058 Other specified cough: Secondary | ICD-10-CM | POA: Diagnosis not present

## 2021-03-29 DIAGNOSIS — Z20822 Contact with and (suspected) exposure to covid-19: Secondary | ICD-10-CM | POA: Diagnosis not present

## 2021-03-29 DIAGNOSIS — Z03818 Encounter for observation for suspected exposure to other biological agents ruled out: Secondary | ICD-10-CM | POA: Diagnosis not present

## 2021-03-31 ENCOUNTER — Encounter: Admission: RE | Payer: Self-pay | Source: Home / Self Care

## 2021-03-31 ENCOUNTER — Ambulatory Visit: Admission: RE | Admit: 2021-03-31 | Payer: Medicare HMO | Source: Home / Self Care | Admitting: Otolaryngology

## 2021-03-31 SURGERY — MYRINGOTOMY WITH TUBE PLACEMENT
Anesthesia: General | Site: Ear | Laterality: Left

## 2021-05-03 DIAGNOSIS — S0006XA Insect bite (nonvenomous) of scalp, initial encounter: Secondary | ICD-10-CM | POA: Diagnosis not present

## 2021-05-03 DIAGNOSIS — W57XXXA Bitten or stung by nonvenomous insect and other nonvenomous arthropods, initial encounter: Secondary | ICD-10-CM | POA: Diagnosis not present

## 2021-05-03 NOTE — ED Provider Notes (Signed)
[facility]    HPI:  05/03/21,   Time: 10:09 PM EDT         Wendy Weiss is a 75 y.o. female presenting to the ED for bite.  Patient states that she was walking outside doing yard work today when she showered this evening and pulled a tick out of her hair.  Patient states that she knows that the tick has not been in her hair for more than 24 hours.  Patient states that she just had a tetanus vaccine less than 1 year ago.  Patient denies any fever or chills.  She denies any open wounds to the scalp.  Patient states that she did look for any bleeding there was none.  Patient did not bring the tick in a Ziploc bag with her.  Patient the tick is not engorged.    ROS:   Pertinent positives and negatives are stated within HPI, all other systems reviewed and are negative.  --------------------------------------------- PAST HISTORY ---------------------------------------------  Past Medical History:  has no past medical history on file.    Past Surgical History:  has no past surgical history on file.    Social History:  reports that she has never smoked. She has never used smokeless tobacco.    Family History: family history is not on file.     The patient???s home medications have been reviewed.    Allergies: Patient has no known allergies.    -------------------------------------------------- RESULTS -------------------------------------------------  All laboratory and radiology results have been personally reviewed by myself   LABS:  No results found for this visit on 05/03/21.    RADIOLOGY:  Interpreted by Radiologist.  No orders to display       ------------------------- NURSING NOTES AND VITALS REVIEWED ---------------------------   The nursing notes within the ED encounter and vital signs as below have been reviewed.   BP (!) 141/71    Pulse 90    Temp 97.2 ??F (36.2 ??C) (Temporal)    Resp 16    Wt 101 lb (45.8 kg)    SpO2 98%   Oxygen Saturation Interpretation:  Normal      ---------------------------------------------------PHYSICAL EXAM--------------------------------------      Constitutional/General: Alert and oriented x3, well appearing, non toxic in NAD  Head: NC/AT  Eyes: PERRL, EOMI  Mouth: Oropharynx clear, handling secretions, no trismus  Neck: Supple, full ROM, no meningeal signs  Pulmonary: Lungs clear to auscultation bilaterally, no wheezes, rales, or rhonchi. Not in respiratory distress  Cardiovascular:  Regular rate and rhythm, no murmurs, gallops, or rubs. 2+ distal pulses  Abdomen: Soft, non tender, non distended,   Extremities: Moves all extremities x 4. Warm and well perfused  Skin: warm and dry without rash  Neurologic: GCS 15,  Psych: Normal Affect      ------------------------------ ED COURSE/MEDICAL DECISION MAKING----------------------  Medications   doxycycline hyclate (VIBRAMYCIN) capsule 200 mg (200 mg Oral Given 05/03/21 2146)         Medical Decision Making:    At this time the patient is without objective evidence of an acute process requiring hospitalization or inpatient management.  They have remained hemodynamically stable throughout their entire ED visit and are stable for discharge with outpatient follow-up.     The plan has been discussed in detail and they are aware of the specific conditions for emergent return, as well as the importance of follow-up.  Patient at this time has no visible lesions or wounds to the scalp as her hair was combed through by  myself.  Patient does have AN intact all alive tick in a Ziploc bag which is not engorged and is briskly moving around and crawling.  Patient was given 1 dose of 200 mg of doxycycline p.o. in the ER she was educated to follow-up with her primary care physician.  Patient was educated that this is a prophylactic dose as the tick is been on her for less than 72 hours.  Tick is not engorged at this time.  Patient verbalized understanding all questions were answered.  Patient nontoxic in  appearance and in no distress.patient    Counseling:   The emergency provider has spoken with the patient and discussed today???s results, in addition to providing specific details for the plan of care and counseling regarding the diagnosis and prognosis.  Questions are answered at this time and they are agreeable with the plan.      --------------------------------- IMPRESSION AND DISPOSITION ---------------------------------    IMPRESSION  1. Tick bite of scalp, initial encounter        DISPOSITION  Disposition: Discharge to home  Patient condition is good                 Pauline Good, APRN - CNP  05/03/21 2211

## 2021-05-04 ENCOUNTER — Inpatient Hospital Stay: Admit: 2021-05-04 | Discharge: 2021-05-04 | Disposition: A | Discharge status: Home / Self Care | Payer: MEDICARE

## 2021-05-04 MED ORDER — DOXYCYCLINE HYCLATE 100 MG PO CAPS
100 MG | Freq: Once | ORAL | Status: AC
Start: 2021-05-04 — End: 2021-05-03
  Administered 2021-05-04: 02:00:00 200 mg via ORAL

## 2021-05-04 MED FILL — DOXYCYCLINE HYCLATE 100 MG PO CAPS: 100 mg | ORAL | Qty: 2

## 2021-05-18 DIAGNOSIS — L989 Disorder of the skin and subcutaneous tissue, unspecified: Secondary | ICD-10-CM | POA: Diagnosis not present

## 2021-05-18 DIAGNOSIS — R0982 Postnasal drip: Secondary | ICD-10-CM | POA: Diagnosis not present

## 2021-06-14 DIAGNOSIS — L720 Epidermal cyst: Secondary | ICD-10-CM | POA: Diagnosis not present

## 2021-06-14 DIAGNOSIS — B353 Tinea pedis: Secondary | ICD-10-CM | POA: Diagnosis not present

## 2021-06-21 DIAGNOSIS — H6982 Other specified disorders of Eustachian tube, left ear: Secondary | ICD-10-CM | POA: Diagnosis not present

## 2021-06-22 ENCOUNTER — Encounter: Payer: Self-pay | Admitting: Otolaryngology

## 2021-06-22 ENCOUNTER — Other Ambulatory Visit: Payer: Self-pay

## 2021-06-22 NOTE — Discharge Instructions (Signed)
[facility] DISCHARGE INSTRUCTIONS FOR MYRINGOTOMY AND TUBE INSERTION  [facility] PAUL JUENGEL, M.D.  Diet:   After surgery, the patient should take only liquids and foods as tolerated.  The patient may then have a regular diet after the effects of anesthesia have worn off, usually about four to six hours after surgery.  Activities:   The patient should rest until the effects of anesthesia have worn off.  After this, there are no restrictions on the normal daily activities.  Medications:   You will be given antibiotic drops to be used in the ears postoperatively.  It is recommended to use 3 drops 3 times a day for 3 days, then the drops should be saved for possible future use.  The tubes should not cause any discomfort to the patient, but if there is any question, Tylenol should be given according to the instructions for the age of the patient.  Other medications should be continued normally.  Precautions:   Should there be recurrent drainage after the tubes are placed, the drops should be used for approximately 3-4 days.  If it does not clear, you should call the ENT office.  Earplugs:   Earplugs are only needed for those who are going to be submerged under water.  When taking a bath or shower and using a cup or showerhead to rinse hair, it is not necessary to wear earplugs.  These come in a variety of fashions, all of which can be obtained at our office.  However, if one is not able to come by the office, then silicone plugs can be found at most pharmacies.  It is not advised to stick anything in the ear that is not approved as an earplug.  Silly putty is not to be used as an earplug.  Swimming is allowed in patients after ear tubes are inserted, however, they must wear earplugs if they are going to be submerged under water.  For those children who are going to be swimming a lot, it is recommended to use a fitted ear mold, which can be made by our audiologist.   If discharge is noticed from the ears, this most likely represents an ear infection.  We would recommend getting your eardrops and using them as indicated above.  If it does not clear, then you should call the ENT office.  For follow up, the patient should return to the ENT office three weeks postoperatively and then every six months as required by the doctor.  

## 2021-06-22 NOTE — Anesthesia Preprocedure Evaluation (Addendum)
Anesthesia Evaluation  Patient identified by MRN, date of birth, ID band Patient awake    Reviewed: Allergy & Precautions, NPO status , Patient's Chart, lab work & pertinent test results  History of Anesthesia Complications Negative for: history of anesthetic complications  Airway Mallampati: II  TM Distance: >3 FB Neck ROM: Full    Dental no notable dental hx.    Pulmonary COPD, Current Smoker,    Pulmonary exam normal breath sounds clear to auscultation       Cardiovascular Exercise Tolerance: Good hypertension, Normal cardiovascular exam Rhythm:Regular Rate:Normal  AAA 4.5 cm 01/2020 SMA and celiac artery stenosis, asymptomatic   Neuro/Psych Hx multiple cerebral artery aneurysms, one causing SAH s/p coiling 02/04/20, another unruptured s/p coiling 06/04/20    GI/Hepatic GERD  ,  Endo/Other    Renal/GU      Musculoskeletal  (+) Arthritis ,   Abdominal Normal abdominal exam  (+) - obese,   Peds  Hematology  (+) Blood dyscrasia, anemia ,   Anesthesia Other Findings Vascular surgery note 02/23/21:  ASSESSMENT/PLAN:: 75 y.o. female here for surveillance of abdominal aortic aneurysm; incidental finding on duplex of SMA and celiac artery stenosis  -AAA essentially unchanged over a 88-month interval; measuring 4.5 cm in maximal diameter -Recheck AAA duplex in 1 year per protocol  -Incidental finding of celiac and SMA stenosis however based on history these are asymptomatic -No indication for revascularization; we will recheck in 1 year   Neurosurgery note 01/19/21: Follow Up of Aneurysm (chief complaint). Description: Mrs. Haskin is a 75 year old woman I am seeing in follow-up. Briefly, she suffered subarachnoid hemorrhage and underwent coil embolization of a posterior communicating artery aneurysm approximately one year ago. She subsequently made a good recovery and underwent elective treatment of a distal right ACA  aneurysm about six months ago. She has done very well since that procedure. She has an unruptured 8 millimeter right cavernous aneurysm. Our last visit we elected to repeat MRA for surveillance prior to deciding about treatment versus continued expectant observation.   Reproductive/Obstetrics                            Anesthesia Physical Anesthesia Plan  ASA: 3  Anesthesia Plan: General   Post-op Pain Management:    Induction:   PONV Risk Score and Plan: Treatment may vary due to age or medical condition  Airway Management Planned: Mask  Additional Equipment:   Intra-op Plan:   Post-operative Plan:   Informed Consent:     Dental advisory given  Plan Discussed with: CRNA  Anesthesia Plan Comments:         Anesthesia Quick Evaluation  Patient Active Problem List   Diagnosis Date Noted  . Body mass index (BMI) 19.9 or less, adult 06/30/2020  . Cerebral aneurysm, nonruptured 06/04/2020  . Elevated blood-pressure reading, without diagnosis of hypertension 03/31/2020  . History of ruptured arterial aneurysm 02/25/2020  . AAA (abdominal aortic aneurysm) without rupture (HCC)   . Acute blood loss anemia   . Hypokalemia   . Leukocytosis   . Essential hypertension   . Agitation   . Ruptured cerebral aneurysm (HCC) 01/31/2020  . SAH (subarachnoid hemorrhage) (HCC) 01/23/2020  . DNR (do not resuscitate) 07/09/2019  . Hyperlipidemia, mixed 07/09/2019  . Osteopenia of lumbar spine 01/08/2019  . Meniere's disease of both ears 06/23/2016  . Lung nodule 02/14/2016  . Other emphysema (HCC) 02/14/2016  . Degenerative cervical disc 11/30/2015  .  Tobacco abuse 11/30/2015    CBC Latest Ref Rng & Units 06/06/2020 06/06/2020 05/28/2020  WBC 4.0 - 10.5 K/uL - 8.5 7.9  Hemoglobin 12.0 - 15.0 g/dL 11.6(L) 11.3(L) 12.6  Hematocrit 36.0 - 46.0 % 34.0(L) 36.8 41.0  Platelets 150 - 400 K/uL - 192 192   BMP Latest Ref Rng & Units 06/06/2020 06/06/2020  05/28/2020  Glucose 70 - 99 mg/dL 440(H) 474(Q) 595(G)  BUN 8 - 23 mg/dL 13 12 20   Creatinine 0.44 - 1.00 mg/dL 3.87 5.64  Sodium 135 - 145 mmol/L 141 139 139  Potassium 3.5 - 5.1 mmol/L 4.3 4.2 4.0  Chloride 98 - 111 mmol/L 109 111 108  CO2 22 - 32 mmol/L - 20(L) 22  Calcium 8.9 - 10.3 mg/dL - 9.1 9.3    Risks and benefits of anesthesia discussed at length, patient or surrogate demonstrates understanding. Appropriately NPO. Plan to proceed with anesthesia.  3.32, MD 06/23/21

## 2021-06-23 ENCOUNTER — Ambulatory Visit
Admission: RE | Admit: 2021-06-23 | Discharge: 2021-06-23 | Disposition: A | Discharge status: Home / Self Care | Payer: Medicare HMO | Attending: Otolaryngology | Admitting: Otolaryngology

## 2021-06-23 ENCOUNTER — Ambulatory Visit: Payer: Medicare HMO | Admitting: Anesthesiology

## 2021-06-23 ENCOUNTER — Encounter
Admission: RE | Disposition: A | Discharge status: Home / Self Care | Payer: Self-pay | Source: Home / Self Care | Attending: Otolaryngology

## 2021-06-23 ENCOUNTER — Other Ambulatory Visit: Payer: Self-pay

## 2021-06-23 ENCOUNTER — Encounter: Payer: Self-pay | Admitting: Otolaryngology

## 2021-06-23 DIAGNOSIS — F17200 Nicotine dependence, unspecified, uncomplicated: Secondary | ICD-10-CM | POA: Insufficient documentation

## 2021-06-23 DIAGNOSIS — Z79891 Long term (current) use of opiate analgesic: Secondary | ICD-10-CM | POA: Diagnosis not present

## 2021-06-23 DIAGNOSIS — Z7951 Long term (current) use of inhaled steroids: Secondary | ICD-10-CM | POA: Diagnosis not present

## 2021-06-23 DIAGNOSIS — Z79899 Other long term (current) drug therapy: Secondary | ICD-10-CM | POA: Diagnosis not present

## 2021-06-23 DIAGNOSIS — H6982 Other specified disorders of Eustachian tube, left ear: Secondary | ICD-10-CM | POA: Insufficient documentation

## 2021-06-23 HISTORY — PX: MYRINGOTOMY WITH TUBE PLACEMENT: SHX5663

## 2021-06-23 HISTORY — DX: Unspecified eustachian tube disorder, unspecified ear: H69.90

## 2021-06-23 HISTORY — DX: Other specified disorders of Eustachian tube, unspecified ear: H69.80

## 2021-06-23 HISTORY — DX: Emphysema, unspecified: J43.9

## 2021-06-23 SURGERY — MYRINGOTOMY WITH TUBE PLACEMENT
Anesthesia: General | Site: Ear | Laterality: Left

## 2021-06-23 MED ORDER — ACETAMINOPHEN 325 MG PO TABS: 325.0000 mg | ORAL_TABLET | ORAL | Status: DC | PRN

## 2021-06-23 MED ORDER — LACTATED RINGERS IV SOLN: INTRAVENOUS | Status: DC

## 2021-06-23 MED ORDER — ONDANSETRON HCL 4 MG/2ML IJ SOLN
INTRAMUSCULAR | Status: DC | PRN
  Administered 2021-06-23: 4 mg via INTRAVENOUS

## 2021-06-23 MED ORDER — CIPROFLOXACIN-DEXAMETHASONE 0.3-0.1 % OT SUSP
OTIC | Status: DC | PRN
  Administered 2021-06-23: 4 [drp] via OTIC

## 2021-06-23 MED ORDER — PROPOFOL 10 MG/ML IV BOLUS
INTRAVENOUS | Status: DC | PRN
  Administered 2021-06-23: 60 mg via INTRAVENOUS

## 2021-06-23 MED ORDER — ONDANSETRON HCL 4 MG/2ML IJ SOLN: 4.0000 mg | Freq: Once | INTRAMUSCULAR | Status: DC | PRN

## 2021-06-23 MED ORDER — ACETAMINOPHEN 160 MG/5ML PO SOLN: 325.0000 mg | ORAL | Status: DC | PRN

## 2021-06-23 MED ORDER — GLYCOPYRROLATE 0.2 MG/ML IJ SOLN
INTRAMUSCULAR | Status: DC | PRN
  Administered 2021-06-23: .1 mg via INTRAVENOUS

## 2021-06-23 MED ORDER — MIDAZOLAM HCL 5 MG/5ML IJ SOLN
INTRAMUSCULAR | Status: DC | PRN
  Administered 2021-06-23: 1 mg via INTRAVENOUS

## 2021-06-23 MED ORDER — LIDOCAINE HCL (CARDIAC) PF 100 MG/5ML IV SOSY
PREFILLED_SYRINGE | INTRAVENOUS | Status: DC | PRN
  Administered 2021-06-23: 50 mg via INTRAVENOUS

## 2021-06-23 SURGICAL SUPPLY — 12 items
BALL CTTN LRG ABS STRL LF (GAUZE/BANDAGES/DRESSINGS) ×1
BLADE MYR LANCE NRW W/HDL (BLADE) ×2 IMPLANT
CANISTER SUCT 1200ML W/VALVE (MISCELLANEOUS) ×2 IMPLANT
COTTONBALL LRG STERILE PKG (GAUZE/BANDAGES/DRESSINGS) ×2 IMPLANT
GLOVE SURG GAMMEX PI TX LF 7.5 (GLOVE) ×2 IMPLANT
STRAP BODY AND KNEE 60X3 (MISCELLANEOUS) ×2 IMPLANT
TOWEL OR 17X26 4PK STRL BLUE (TOWEL DISPOSABLE) ×2 IMPLANT
TUBE EAR ARMSTRONG FL 1.14X4.5 (OTOLOGIC RELATED) ×3 IMPLANT
TUBE EAR T 1.27X4.5 GO LF (OTOLOGIC RELATED) IMPLANT
TUBE EAR T 1.27X5.3 BFLY (OTOLOGIC RELATED) IMPLANT
TUBING CONN 6MMX3.1M (TUBING) ×1
TUBING SUCTION CONN 0.25 STRL (TUBING) ×1 IMPLANT

## 2021-06-23 NOTE — Transfer of Care (Signed)
Immediate Anesthesia Transfer of Care Note  Patient: Autumn Le  Procedure(s) Performed: MYRINGOTOMY WITH TUBE PLACEMENT (Left: Ear)  Patient Location: PACU  Anesthesia Type: General  Level of Consciousness: awake, alert  and patient cooperative  Airway and Oxygen Therapy: Patient Spontanous Breathing and Patient connected to supplemental oxygen  Post-op Assessment: Post-op Vital signs reviewed, Patient's Cardiovascular Status Stable, Respiratory Function Stable, Patent Airway and No signs of Nausea or vomiting  Post-op Vital Signs: Reviewed and stable  Complications: No notable events documented.

## 2021-06-23 NOTE — H&P (Signed)
H&P has been reviewed and patient reevaluated, no changes necessary. To be downloaded later.  

## 2021-06-23 NOTE — Op Note (Signed)
06/23/2021  7:53 AM    Jorje Guild  [id number]   Pre-Op Dx: Junita Push tube dysfunction left side  Post-op Dx: Same  Proc: Left myringotomy with tube  Surg: Cammy Copa  Anes:  General by mask  EBL:  None  Comp: None  Findings: The eardrum was inflated because of the positive pressure mask anesthesia.  There was dried wax in the outside of the eardrum which I cleaned out but the skin was very thin here and she got a small amount of ooze from the anterior skin.  There is minimal fluid behind the eardrum.  A short Armstrong 5 tube was placed.  Only the left side was done.  Procedure: With the patient in a comfortable supine position, general mask anesthesia was administered.  At an appropriate level, microscope and speculum were used to examine and clean the LEFT ear canal.  The findings were as described above.  An anterior inferior radial myringotomy incision was sharply executed.  Middle ear contents were suctioned clear.  A PE tube was placed without difficulty.  Ciprodex otic solution was instilled into the external canal, and insufflated into the middle ear.  A cotton ball was placed at the external meatus. Hemostasis was observed.  This side was completed.    Following this  The patient was returned to anesthesia, awakened, and transferred to recovery in stable condition.  Dispo:  PACU to home  Plan: Routine drop use and water precautions.  Recheck my office three weeks.   Beverly Sessions Nollan Muldrow 7:53 AM 06/23/2021

## 2021-06-23 NOTE — Anesthesia Procedure Notes (Signed)
Procedure Name: General with mask airway Date/Time: 06/23/2021 7:39 AM Performed by: Jimmy Picket, CRNA Pre-anesthesia Checklist: Patient identified, Patient being monitored, Emergency Drugs available, Timeout performed and Suction available Patient Re-evaluated:Patient Re-evaluated prior to induction Oxygen Delivery Method: Circle system utilized Preoxygenation: Pre-oxygenation with 100% oxygen Induction Type: Combination inhalational/ intravenous induction Ventilation: Mask ventilation without difficulty Dental Injury: Teeth and Oropharynx as per pre-operative assessment

## 2021-06-23 NOTE — Anesthesia Postprocedure Evaluation (Addendum)
Anesthesia Post Note  Patient: Autumn Le  Procedure(s) Performed: MYRINGOTOMY WITH TUBE PLACEMENT (Left: Ear)     Patient location during evaluation: PACU Anesthesia Type: General Level of consciousness: awake and alert Pain management: pain level controlled Vital Signs Assessment: post-procedure vital signs reviewed and stable Respiratory status: spontaneous breathing, nonlabored ventilation, respiratory function stable and patient connected to nasal cannula oxygen Cardiovascular status: stable and blood pressure returned to baseline Postop Assessment: no apparent nausea or vomiting Anesthetic complications: no   No notable events documented.  Edwyna Ready

## 2021-06-24 ENCOUNTER — Encounter: Payer: Self-pay | Admitting: Otolaryngology

## 2021-07-11 DIAGNOSIS — H903 Sensorineural hearing loss, bilateral: Secondary | ICD-10-CM | POA: Diagnosis not present

## 2021-07-11 DIAGNOSIS — H6982 Other specified disorders of Eustachian tube, left ear: Secondary | ICD-10-CM | POA: Diagnosis not present

## 2021-08-12 DIAGNOSIS — J438 Other emphysema: Secondary | ICD-10-CM | POA: Diagnosis not present

## 2021-08-12 DIAGNOSIS — E782 Mixed hyperlipidemia: Secondary | ICD-10-CM | POA: Diagnosis not present

## 2021-08-12 DIAGNOSIS — Z720 Tobacco use: Secondary | ICD-10-CM | POA: Diagnosis not present

## 2021-08-22 DIAGNOSIS — E785 Hyperlipidemia, unspecified: Secondary | ICD-10-CM | POA: Diagnosis not present

## 2021-08-22 DIAGNOSIS — Z1389 Encounter for screening for other disorder: Secondary | ICD-10-CM | POA: Diagnosis not present

## 2021-08-22 DIAGNOSIS — Z720 Tobacco use: Secondary | ICD-10-CM | POA: Diagnosis not present

## 2021-08-22 DIAGNOSIS — Z0000 Encounter for general adult medical examination without abnormal findings: Secondary | ICD-10-CM | POA: Diagnosis not present

## 2021-11-11 DIAGNOSIS — F5101 Primary insomnia: Secondary | ICD-10-CM | POA: Diagnosis not present

## 2022-02-21 ENCOUNTER — Other Ambulatory Visit: Payer: Self-pay | Admitting: Family Medicine

## 2022-02-21 DIAGNOSIS — Z1231 Encounter for screening mammogram for malignant neoplasm of breast: Secondary | ICD-10-CM

## 2022-03-02 ENCOUNTER — Other Ambulatory Visit: Payer: Self-pay

## 2022-03-02 DIAGNOSIS — I714 Abdominal aortic aneurysm, without rupture, unspecified: Secondary | ICD-10-CM

## 2022-03-03 NOTE — Progress Notes (Signed)
?Office Note  ? ? ? ?CC:  follow up ?Requesting Provider:  Marisue IvanLinthavong, Kanhka, MD ? ?HPI: Autumn Le is a 76 y.o. (12-25-1945) female who presents for routine follow up of AAA. We have been following her AAA for several years. Her last duplex showed a 4.5 cm aneurysm. She additionally has known Celiac and SMA disease. She has been without any associated symptoms.  ? ?She returns today for 1 year follow up with duplex. She denies any abdominal pain or back pain. No food fear or post prandial pain. She has had unintentional weight loss over the past several months. She says her appetite is still good. She is also drinking ensure twice daily. She has discussed this with her PCP and all her lab work has been normal. She has decreased her physical activity following back injury in the gym in January. She is currently going to Chiropractor for this. She feels she has not lost any of her strength. She says overall she feels really good. She denies any claudication, rest pain or tissue loss. Continues to smoke 1 ppd ? ?The pt is not on a statin for cholesterol management. Takes Zetia for HLD ?The pt is not on a daily aspirin.   Other AC:  None ?The pt is not on medications for hypertension.   ?The pt is not diabetic.   ?Tobacco hx:  Current, 1 ppd ? ?Past Medical History:  ?Diagnosis Date  ? AAA (abdominal aortic aneurysm) (HCC)   ? 4.6 cm AAA 01/2020, 6 month f/u rec (Vascular surgeon: Fabienne Brunsharles Fields, MD)  ? COPD (chronic obstructive pulmonary disease) (HCC)   ? patient denies - states she has no issues  ? Degenerative joint disease of cervical spine   ? Emphysema lung (HCC)   ? Eustachian tube dysfunction   ? GERD (gastroesophageal reflux disease)   ? HLD (hyperlipidemia)   ? Hypertension   ? Meniere's disease in remission, bilateral   ? Osteopenia   ? Subarachnoid hemorrhage (HCC)   ? Vertigo   ? ? ?Past Surgical History:  ?Procedure Laterality Date  ? BRAIN SURGERY  2021  ? CATARACT EXTRACTION W/ INTRAOCULAR LENS   IMPLANT, BILATERAL    ? EYE SURGERY    ? IR ANGIO INTRA EXTRACRAN SEL INTERNAL CAROTID BILAT MOD SED  01/23/2020  ? IR ANGIO INTRA EXTRACRAN SEL INTERNAL CAROTID UNI R MOD SED  06/04/2020  ? IR ANGIO VERTEBRAL SEL VERTEBRAL UNI L MOD SED  01/23/2020  ? IR ANGIOGRAM FOLLOW UP STUDY  01/23/2020  ? IR ANGIOGRAM FOLLOW UP STUDY  01/23/2020  ? IR ANGIOGRAM FOLLOW UP STUDY  01/23/2020  ? IR ANGIOGRAM FOLLOW UP STUDY  01/23/2020  ? IR ANGIOGRAM FOLLOW UP STUDY  06/04/2020  ? IR ANGIOGRAM FOLLOW UP STUDY  06/04/2020  ? IR NEURO EACH ADD'L AFTER BASIC UNI RIGHT (MS)  06/04/2020  ? IR TRANSCATH/EMBOLIZ  01/23/2020  ? IR TRANSCATH/EMBOLIZ  06/04/2020  ? IR US GUIDE VASC ACCESS RIGHT  06/04/2020  ? MYRINGOTOMY WITH TUBE PLACEMENT Left 06/23/2021  ? Procedure: MYRINGOTOMY WITH TUBE PLACEMENT;  Surgeon: Vernie MurdersJuengel, Paul, MD;  [facility];  Service: ENT;  Laterality: Left;  ? RADIOLOGY WITH ANESTHESIA N/A 01/23/2020  ? Procedure: RADIOLOGY WITH ANESTHESIA;  Surgeon: Lisbeth RenshawNundkumar, Neelesh, MD;  [facility];  Service: Radiology;  Laterality: N/A;  ? RADIOLOGY WITH ANESTHESIA N/A 06/04/2020  ? Procedure: Coil embolization of left ACA aneurysm;  Surgeon: Lisbeth RenshawNundkumar, Neelesh, MD;  [facility];  Service: Radiology;  Laterality: N/A;  ? TONSILLECTOMY    ? ? ?Social History  ? ?Socioeconomic History  ? Marital status: Single  ?  Spouse name: Not on file  ? Number of children: Not on file  ? Years of education: Not on file  ? Highest education level: Not on file  ?Occupational History  ? Not on file  ?Tobacco Use  ? Smoking status: Every Day  ?  Packs/day: 0.25  ?  Years: 20.00  ?  Pack years: 5.00  ?  Types: Cigarettes  ?  Last attempt to quit: 01/23/2020  ?  Years since quitting: 2.1  ? Smokeless tobacco: Never  ?Vaping Use  ? Vaping Use: Never used  ?Substance and Sexual Activity  ? Alcohol use: Yes  ?  Comment: occasional glass of wine   ? Drug use: Not Currently  ? Sexual activity: Not on file  ?Other Topics Concern   ? Not on file  ?Social History Narrative  ? Not on file  ? ?Social Determinants of Health  ? ?Financial Resource Strain: Not on file  ?Food Insecurity: Not on file  ?Transportation Needs: Not on file  ?Physical Activity: Not on file  ?Stress: Not on file  ?Social Connections: Not on file  ?Intimate Partner Violence: Not on file  ? ? ?Family History  ?Problem Relation Age of Onset  ? Heart disease Mother   ? Breast cancer Maternal Aunt   ? ? ?Current Outpatient Medications  ?Medication Sig Dispense Refill  ? acetaminophen (TYLENOL) 325 MG tablet Take 650 mg by mouth every 6 (six) hours as needed for headache (pain).    ? Cholecalciferol 50 MCG (2000 UT) CAPS Take 2,000 Units by mouth at bedtime.     ? fluticasone (FLONASE) 50 MCG/ACT nasal spray Place 2 sprays into both nostrils daily.    ? fluticasone furoate-vilanterol (BREO ELLIPTA) 100-25 MCG/INH AEPB Inhale 1 puff into the lungs daily.    ? loratadine (CLARITIN) 10 MG tablet Take 10 mg by mouth at bedtime.    ? Multiple Vitamins-Minerals (VITAMIN D3 COMPLETE PO) Take by mouth.    ? Red Yeast Rice Extract 600 MG CAPS Take by mouth.    ? Calcium Carbonate (CALCIUM 600 PO) Take 600 mg by mouth at bedtime. (Patient not taking: Reported on 03/07/2022)    ? ezetimibe (ZETIA) 10 MG tablet Take 20 mg by mouth.    ? topiramate (TOPAMAX) 25 MG tablet Take 25 mg by mouth 2 (two) times daily. (Patient not taking: Reported on 06/22/2021)    ? ?No current facility-administered medications for this visit.  ? ? ?Allergies  ?Allergen Reactions  ? Latex Hives  ?  bandaids  ? Nickel   ? ? ? ?REVIEW OF SYSTEMS:  ?[X]  denotes positive finding, [ ]  denotes negative finding ?Cardiac  Comments:  ?Chest pain or chest pressure:    ?Shortness of breath upon exertion:    ?Short of breath when lying flat:    ?Irregular heart rhythm:    ?    ?Vascular    ?Pain in calf, thigh, or hip brought on by ambulation:    ?Pain in feet at night that wakes you up from your sleep:     ?Blood clot in your  veins:    ?Leg swelling:     ?    ?Pulmonary    ?Oxygen at home:    ?Productive cough:     ?Wheezing:     ?    ?Neurologic    ?  Sudden weakness in arms or legs:     ?Sudden numbness in arms or legs:     ?Sudden onset of difficulty speaking or slurred speech:    ?Temporary loss of vision in one eye:     ?Problems with dizziness:     ?    ?Gastrointestinal    ?Blood in stool:     ?Vomited blood:     ?    ?Genitourinary    ?Burning when urinating:     ?Blood in urine:    ?    ?Psychiatric    ?Major depression:     ?    ?Hematologic    ?Bleeding problems:    ?Problems with blood clotting too easily:    ?    ?Skin    ?Rashes or ulcers:    ?    ?Constitutional    ?Fever or chills:    ? ? ?PHYSICAL EXAMINATION: ? ?Vitals:  ? 03/07/22 0946  ?BP: 134/72  ?Pulse: (!) 57  ?Resp: 18  ?Temp: 97.6 ?F (36.4 ?C)  ?TempSrc: Temporal  ?SpO2: 99%  ?Weight: 88 lb 4.8 oz (40.1 kg)  ?Height: 5\' 2"  (1.575 m)  ? ? ?General:  thin but well appearing, pleasant female in NAD; vital signs documented above ?Gait: Normal ?HENT: WNL, normocephalic ?Pulmonary: normal non-labored breathing , without wheezing ?Cardiac: regular HR, without  Murmurs without carotid bruit ?Abdomen: flat, soft, NT, no masses ?Vascular Exam/Pulses: ? Right Left  ?Radial 2+ (normal) 2+ (normal)  ? ?Femoral 2+ (normal) 2+ (normal)  ?Popliteal Not palpable Not palpable  ?DP 2+ (normal) 2+ (normal)  ?PT 1+ (weak) 1+ (weak)  ? ?Extremities: without ischemic changes, without Gangrene , without cellulitis; without open wounds;  ?Musculoskeletal: no muscle wasting or atrophy  ?Neurologic: A&O X 3;  No focal weakness or paresthesias are detected ?Psychiatric:  The pt has Normal affect. ? ? ?Non-Invasive Vascular Imaging:   ?Abdominal Aorta Findings:  ?+-----------+-------+----------+----------+--------+--------+--------+  ?Location   AP (cm)Trans (cm)PSV (cm/s)WaveformThrombusComments  ?+-----------+-------+----------+----------+--------+--------+--------+  ?Proximal    2.71   2.85      47                        plaque    ?+-----------+-------+----------+----------+--------+--------+--------+  ?Mid        4.48   4.58      24                Present           ?+-----------+-------+

## 2022-03-07 ENCOUNTER — Ambulatory Visit (INDEPENDENT_AMBULATORY_CARE_PROVIDER_SITE_OTHER): Payer: Medicare Other | Admitting: Physician Assistant

## 2022-03-07 ENCOUNTER — Ambulatory Visit (HOSPITAL_COMMUNITY)
Admission: RE | Admit: 2022-03-07 | Discharge: 2022-03-07 | Disposition: A | Discharge status: Home / Self Care | Payer: Medicare Other | Source: Ambulatory Visit | Attending: Physician Assistant | Admitting: Physician Assistant

## 2022-03-07 VITALS — BP 134/72 | HR 57 | Temp 97.6°F | Resp 18 | Ht 62.0 in | Wt 88.3 lb

## 2022-03-07 DIAGNOSIS — I714 Abdominal aortic aneurysm, without rupture, unspecified: Secondary | ICD-10-CM

## 2022-03-28 ENCOUNTER — Ambulatory Visit
Admission: RE | Admit: 2022-03-28 | Discharge: 2022-03-28 | Disposition: A | Discharge status: Home / Self Care | Payer: Medicare Other | Source: Ambulatory Visit | Attending: Family Medicine | Admitting: Family Medicine

## 2022-03-28 DIAGNOSIS — Z1231 Encounter for screening mammogram for malignant neoplasm of breast: Secondary | ICD-10-CM | POA: Insufficient documentation

## 2022-03-31 ENCOUNTER — Other Ambulatory Visit: Payer: Self-pay | Admitting: Family Medicine

## 2022-03-31 DIAGNOSIS — R928 Other abnormal and inconclusive findings on diagnostic imaging of breast: Secondary | ICD-10-CM

## 2022-03-31 DIAGNOSIS — N6489 Other specified disorders of breast: Secondary | ICD-10-CM

## 2022-03-31 DIAGNOSIS — N63 Unspecified lump in unspecified breast: Secondary | ICD-10-CM

## 2022-05-18 ENCOUNTER — Other Ambulatory Visit: Payer: Medicare Other

## 2022-05-29 ENCOUNTER — Other Ambulatory Visit: Payer: Medicare Other

## 2022-05-29 ENCOUNTER — Ambulatory Visit
Admission: RE | Admit: 2022-05-29 | Discharge: 2022-05-29 | Disposition: A | Discharge status: Home / Self Care | Payer: Medicare Other | Source: Ambulatory Visit | Attending: Family Medicine | Admitting: Family Medicine

## 2022-05-29 DIAGNOSIS — N6489 Other specified disorders of breast: Secondary | ICD-10-CM | POA: Insufficient documentation

## 2022-05-29 DIAGNOSIS — N63 Unspecified lump in unspecified breast: Secondary | ICD-10-CM | POA: Insufficient documentation

## 2022-05-29 DIAGNOSIS — R928 Other abnormal and inconclusive findings on diagnostic imaging of breast: Secondary | ICD-10-CM | POA: Insufficient documentation

## 2022-05-30 ENCOUNTER — Other Ambulatory Visit: Payer: Self-pay | Admitting: *Deleted

## 2022-05-30 DIAGNOSIS — F17210 Nicotine dependence, cigarettes, uncomplicated: Secondary | ICD-10-CM

## 2022-05-30 DIAGNOSIS — Z122 Encounter for screening for malignant neoplasm of respiratory organs: Secondary | ICD-10-CM

## 2022-06-14 ENCOUNTER — Ambulatory Visit
Admission: RE | Admit: 2022-06-14 | Discharge: 2022-06-14 | Disposition: A | Discharge status: Home / Self Care | Payer: Medicare Other | Source: Ambulatory Visit | Attending: Acute Care | Admitting: Acute Care

## 2022-06-14 ENCOUNTER — Ambulatory Visit (INDEPENDENT_AMBULATORY_CARE_PROVIDER_SITE_OTHER): Payer: Medicare Other | Admitting: Acute Care

## 2022-06-14 ENCOUNTER — Encounter: Payer: Self-pay | Admitting: Acute Care

## 2022-06-14 DIAGNOSIS — F17210 Nicotine dependence, cigarettes, uncomplicated: Secondary | ICD-10-CM | POA: Diagnosis not present

## 2022-06-14 DIAGNOSIS — Z122 Encounter for screening for malignant neoplasm of respiratory organs: Secondary | ICD-10-CM | POA: Insufficient documentation

## 2022-06-14 NOTE — Patient Instructions (Signed)
Thank you for participating in the [facility]. It was our pleasure to meet you today. We will call you with the results of your scan within the next few days. Your scan will be assigned a Lung RADS category score by the physicians reading the scans.  This Lung RADS score determines follow up scanning.  See below for description of categories, and follow up screening recommendations. We will be in touch to schedule your follow up screening annually or based on recommendations of our providers. We will fax a copy of your scan results to your Primary Care Physician, or the physician who referred you to the program, to ensure they have the results. Please call the office if you have any questions or concerns regarding your scanning experience or results.  Our office number is [phone]. Please speak with Denise Phelps, RN. , or  Denise Buckner RN, They are  our Lung Cancer Screening RN.'s If They are unavailable when you call, Please leave a message on the voice mail. We will return your call at our earliest convenience.This voice mail is monitored several times a day.  Remember, if your scan is normal, we will scan you annually as long as you continue to meet the criteria for the program. (Age 55-77, Current smoker or smoker who has quit within the last 15 years). If you are a smoker, remember, quitting is the single most powerful action that you can take to decrease your risk of lung cancer and other pulmonary, breathing related problems. We know quitting is hard, and we are here to help.  Please let us know if there is anything we can do to help you meet your goal of quitting. If you are a former smoker, congratulations. We are proud of you! Remain smoke free! Remember you can refer friends or family members through the number above.  We will screen them to make sure they meet criteria for the program. Thank you for helping us take better care of you by  participating in Lung Screening.  You can receive free nicotine replacement therapy ( patches, gum or mints) by calling 1-800-QUIT NOW. Please call so we can get you on the path to becoming  a non-smoker. I know it is hard, but you can do this!  Lung RADS Categories:  Lung RADS 1: no nodules or definitely non-concerning nodules.  Recommendation is for a repeat annual scan in 12 months.  Lung RADS 2:  nodules that are non-concerning in appearance and behavior with a very low likelihood of becoming an active cancer. Recommendation is for a repeat annual scan in 12 months.  Lung RADS 3: nodules that are probably non-concerning , includes nodules with a low likelihood of becoming an active cancer.  Recommendation is for a 6-month repeat screening scan. Often noted after an upper respiratory illness. We will be in touch to make sure you have no questions, and to schedule your 6-month scan.  Lung RADS 4 A: nodules with concerning findings, recommendation is most often for a follow up scan in 3 months or additional testing based on our provider's assessment of the scan. We will be in touch to make sure you have no questions and to schedule the recommended 3 month follow up scan.  Lung RADS 4 B:  indicates findings that are concerning. We will be in touch with you to schedule additional diagnostic testing based on our provider's  assessment of the scan.  Other options for assistance in smoking cessation (   As covered by your insurance benefits)  Hypnosis for smoking cessation  [employer] [phone]  Acupuncture for smoking cessation  [facility] [phone]   

## 2022-06-14 NOTE — Progress Notes (Signed)
Virtual Visit via Telephone Note  I connected with Autumn Le on 06/14/22 at 11:30 AM EDT by telephone and verified that I am speaking with the correct person using two identifiers.  Location: Patient:  At home Provider: [address], [place], [place], [address]    I discussed the limitations, risks, security and privacy concerns of performing an evaluation and management service by telephone and the availability of in person appointments. I also discussed with the patient that there may be a patient responsible charge related to this service. The patient expressed understanding and agreed to proceed.  Shared Decision Making Visit Lung Cancer Screening Program [phone])   Eligibility: Age 76 y.o. Pack Years Smoking History Calculation 55 pack year smoking history (# packs/per year x # years smoked) Recent History of coughing up blood  no Unexplained weight loss? no ( >Than 15 pounds within the last 6 months ) Prior History Lung / other cancer no (Diagnosis within the last 5 years already requiring surveillance chest CT Scans). Smoking Status Current Smoker Former Smokers: Years since quit:  NA  Quit Date:  NA  Visit Components: Discussion included one or more decision making aids. yes Discussion included risk/benefits of screening. yes Discussion included potential follow up diagnostic testing for abnormal scans. yes Discussion included meaning and risk of over diagnosis. yes Discussion included meaning and risk of False Positives. yes Discussion included meaning of total radiation exposure. yes  Counseling Included: Importance of adherence to annual lung cancer LDCT screening. yes Impact of comorbidities on ability to participate in the program. yes Ability and willingness to under diagnostic treatment. yes  Smoking Cessation Counseling: Current Smokers:  Discussed importance of smoking cessation. yes Information about tobacco cessation classes and interventions  provided to patient. yes Patient provided with "ticket" for LDCT Scan. yes Symptomatic Patient. no  Counseling NA Diagnosis Code: Tobacco Use Z72.0 Asymptomatic Patient yes  Counseling (Intermediate counseling: > three minutes counseling) V2536 Former Smokers:  Discussed the importance of maintaining cigarette abstinence. yes Diagnosis Code: Personal History of Nicotine Dependence. U44.034 Information about tobacco cessation classes and interventions provided to patient. Yes Patient provided with "ticket" for LDCT Scan. yes Written Order for Lung Cancer Screening with LDCT placed in Epic. Yes (CT Chest Lung Cancer Screening Low Dose W/O CM) [id number] Z12.2-Screening of respiratory organs Z87.891-Personal history of nicotine dependence  I have spent 25 minutes of face to face/ virtual visit   time with  Autumn Le discussing the risks and benefits of lung cancer screening. We viewed / discussed a power point together that explained in detail the above noted topics. We paused at intervals to allow for questions to be asked and answered to ensure understanding.We discussed that the single most powerful action that she can take to decrease her risk of developing lung cancer is to quit smoking. We discussed whether or not she is ready to commit to setting a quit date. We discussed options for tools to aid in quitting smoking including nicotine replacement therapy, non-nicotine medications, support groups, Quit Smart classes, and behavior modification. We discussed that often times setting smaller, more achievable goals, such as eliminating 1 cigarette a day for a week and then 2 cigarettes a day for a week can be helpful in slowly decreasing the number of cigarettes smoked. This allows for a sense of accomplishment as well as providing a clinical benefit. I provided  her  with smoking cessation  information  with contact information for community resources, classes, free nicotine  replacement therapy, and  access to mobile apps, text messaging, and on-line smoking cessation help. I have also provided  her  the office contact information in the event she needs to contact me, or the screening staff. We discussed the time and location of the scan, and that either Abigail Miyamoto RN, Karlton Lemon, RN  or I will call / send a letter with the results within 24-72 hours of receiving them. The patient verbalized understanding of all of  the above and had no further questions upon leaving the office. They have my contact information in the event they have any further questions.  I spent 3 minutes counseling on smoking cessation and the health risks of continued tobacco abuse.  I explained to the patient that there has been a high incidence of coronary artery disease noted on these exams. I explained that this is a non-gated exam therefore degree or severity cannot be determined. This patient is not on statin therapy. I have asked the patient to follow-up with their PCP regarding any incidental finding of coronary artery disease and management with diet or medication as their PCP  feels is clinically indicated. The patient verbalized understanding of the above and had no further questions upon completion of the visit.      Bevelyn Ngo, NP 06/14/2022

## 2022-06-16 ENCOUNTER — Other Ambulatory Visit: Payer: Self-pay | Admitting: Acute Care

## 2022-06-16 DIAGNOSIS — F17210 Nicotine dependence, cigarettes, uncomplicated: Secondary | ICD-10-CM

## 2022-06-16 DIAGNOSIS — Z87891 Personal history of nicotine dependence: Secondary | ICD-10-CM

## 2022-06-16 DIAGNOSIS — Z122 Encounter for screening for malignant neoplasm of respiratory organs: Secondary | ICD-10-CM

## 2022-09-07 ENCOUNTER — Other Ambulatory Visit: Payer: Self-pay | Admitting: Family Medicine

## 2022-09-07 DIAGNOSIS — R634 Abnormal weight loss: Secondary | ICD-10-CM

## 2022-09-19 ENCOUNTER — Ambulatory Visit
Admission: RE | Admit: 2022-09-19 | Discharge: 2022-09-19 | Disposition: A | Discharge status: Home / Self Care | Payer: Medicare Other | Source: Ambulatory Visit | Attending: Family Medicine | Admitting: Family Medicine

## 2022-09-19 DIAGNOSIS — R634 Abnormal weight loss: Secondary | ICD-10-CM | POA: Insufficient documentation

## 2022-09-19 LAB — POCT I-STAT CREATININE: Creatinine, Ser: 0.3 mg/dL — ABNORMAL LOW (ref 0.44–1.00)

## 2022-09-19 MED ORDER — IOHEXOL 300 MG/ML  SOLN
75.0000 mL | Freq: Once | INTRAMUSCULAR | Status: AC | PRN
  Administered 2022-09-19: 75 mL via INTRAVENOUS

## 2022-10-10 ENCOUNTER — Encounter (INDEPENDENT_AMBULATORY_CARE_PROVIDER_SITE_OTHER): Payer: Self-pay | Admitting: Vascular Surgery

## 2022-10-10 ENCOUNTER — Ambulatory Visit (INDEPENDENT_AMBULATORY_CARE_PROVIDER_SITE_OTHER): Payer: Medicare Other | Admitting: Vascular Surgery

## 2022-10-10 VITALS — BP 127/78 | HR 71 | Resp 15 | Wt 83.6 lb

## 2022-10-10 DIAGNOSIS — I10 Essential (primary) hypertension: Secondary | ICD-10-CM

## 2022-10-10 DIAGNOSIS — I7143 Infrarenal abdominal aortic aneurysm, without rupture: Secondary | ICD-10-CM | POA: Diagnosis not present

## 2022-10-10 DIAGNOSIS — I607 Nontraumatic subarachnoid hemorrhage from unspecified intracranial artery: Secondary | ICD-10-CM | POA: Diagnosis not present

## 2022-10-10 DIAGNOSIS — E782 Mixed hyperlipidemia: Secondary | ICD-10-CM

## 2022-10-10 NOTE — Assessment & Plan Note (Signed)
lipid control important in reducing the progression of atherosclerotic disease.   

## 2022-10-10 NOTE — H&P (View-Only) (Signed)
Patient ID: Autumn Le, female   DOB: 1946/10/11, 76 y.o.   MRN: [id number]  No chief complaint on file.   HPI Autumn Le is a 76 y.o. female.  I am asked to see the patient by Debbra RidingJason Whitaker for evaluation of her AAA.  The patient has had a known abdominal aortic aneurysm for several years.  She has a strong history in her family of aneurysm disease with a brother and a father who both had abdominal aortic aneurysms.  She has also undergone treatment for an intracranial aneurysm in the past.  She was followed at another center and as best I can tell her last diameter of her abdominal aortic aneurysm is roughly 4.6 cm.  She has had significant weight loss and her aneurysm is very easily palpable and she complains of some pain and tenderness in her abdomen.  She recently underwent a CT scan of the abdomen pelvis which I have independently reviewed which demonstrates an approximately 5.3 cm infrarenal abdominal aortic aneurysm.  There did appear to be some degree of iliac artery occlusive disease present as well.  Interestingly, the official report says a 5 cm abdominal aortic aneurysm but the measurements both by myself as well as the radiologist that are visible on the scan measure between 52 and 53 mm in largest diameter.   Past Medical History:  Diagnosis Date   AAA (abdominal aortic aneurysm) (HCC)    4.6 cm AAA 01/2020, 6 month f/u rec (Vascular surgeon: Fabienne Brunsharles Fields, MD)   COPD (chronic obstructive pulmonary disease) [facility](HCC)    patient denies - states she has no issues   Degenerative joint disease of cervical spine    Emphysema lung (HCC)    Eustachian tube dysfunction    GERD (gastroesophageal reflux disease)    HLD (hyperlipidemia)    Hypertension    Meniere's disease in remission, bilateral    Osteopenia    Subarachnoid hemorrhage (HCC)    Vertigo     Past Surgical History:  Procedure Laterality Date   BRAIN SURGERY  2021   CATARACT EXTRACTION W/ INTRAOCULAR LENS   IMPLANT, BILATERAL     EYE SURGERY     IR ANGIO INTRA EXTRACRAN SEL INTERNAL CAROTID BILAT MOD SED  01/23/2020   IR ANGIO INTRA EXTRACRAN SEL INTERNAL CAROTID UNI R MOD SED  06/04/2020   IR ANGIO VERTEBRAL SEL VERTEBRAL UNI L MOD SED  01/23/2020   IR ANGIOGRAM FOLLOW UP STUDY  01/23/2020   IR ANGIOGRAM FOLLOW UP STUDY  01/23/2020   IR ANGIOGRAM FOLLOW UP STUDY  01/23/2020   IR ANGIOGRAM FOLLOW UP STUDY  01/23/2020   IR ANGIOGRAM FOLLOW UP STUDY  06/04/2020   IR ANGIOGRAM FOLLOW UP STUDY  06/04/2020   IR NEURO EACH ADD'L AFTER BASIC UNI RIGHT (MS)  06/04/2020   IR TRANSCATH/EMBOLIZ  01/23/2020   IR TRANSCATH/EMBOLIZ  06/04/2020   IR US GUIDE VASC ACCESS RIGHT  06/04/2020   MYRINGOTOMY WITH TUBE PLACEMENT Left 06/23/2021   Procedure: MYRINGOTOMY WITH TUBE PLACEMENT;  Surgeon: Vernie MurdersJuengel, Paul, MD;  [facility];  Service: ENT;  Laterality: Left;   RADIOLOGY WITH ANESTHESIA N/A 01/23/2020   Procedure: RADIOLOGY WITH ANESTHESIA;  Surgeon: Lisbeth RenshawNundkumar, Neelesh, MD;  [facility];  Service: Radiology;  Laterality: N/A;   RADIOLOGY WITH ANESTHESIA N/A 06/04/2020   Procedure: Coil embolization of left ACA aneurysm;  Surgeon: Lisbeth RenshawNundkumar, Neelesh, MD;  [facility];  Service: Radiology;  Laterality: N/A;   TONSILLECTOMY  Family History  Problem Relation Age of Onset   Heart disease Mother    Breast cancer Maternal Aunt   Father and brother with AAA   Social History   Tobacco Use   Smoking status: Every Day    Packs/day: 0.25    Years: 20.00    Total pack years: 5.00    Types: Cigarettes    Last attempt to quit: 01/23/2020    Years since quitting: 2.7   Smokeless tobacco: Never  Vaping Use   Vaping Use: Never used  Substance Use Topics   Alcohol use: Yes    Comment: occasional glass of wine    Drug use: Not Currently     Allergies  Allergen Reactions   Latex Hives    bandaids   Nickel     Current Outpatient Medications  Medication Sig Dispense  Refill   acetaminophen (TYLENOL) 325 MG tablet Take 650 mg by mouth every 6 (six) hours as needed for headache (pain).     Ascorbic Acid (VITAMIN C) 1000 MG tablet Take 1,000 mg by mouth daily.     Cholecalciferol 50 MCG (2000 UT) CAPS Take 2,000 Units by mouth at bedtime.      clopidogrel (PLAVIX) 75 MG tablet Take 75 mg by mouth daily.     Docusate Calcium (STOOL SOFTENER PO) Take 1-2 tablets by mouth daily.     fluticasone (FLONASE) 50 MCG/ACT nasal spray Place 2 sprays into both nostrils daily.     fluticasone furoate-vilanterol (BREO ELLIPTA) 100-25 MCG/INH AEPB Inhale 1 puff into the lungs daily.     loratadine (CLARITIN) 10 MG tablet Take 10 mg by mouth at bedtime.     Multiple Vitamins-Minerals (VITAMIN D3 COMPLETE PO) Take by mouth.     Red Yeast Rice Extract 600 MG CAPS Take by mouth.     Calcium Carbonate (CALCIUM 600 PO) Take 600 mg by mouth at bedtime. (Patient not taking: Reported on 03/07/2022)     ezetimibe (ZETIA) 10 MG tablet Take 20 mg by mouth.     topiramate (TOPAMAX) 25 MG tablet Take 25 mg by mouth 2 (two) times daily. (Patient not taking: Reported on 06/22/2021)     No current facility-administered medications for this visit.      REVIEW OF SYSTEMS (Negative unless checked)  Constitutional: [x] Weight loss  [] Fever  [] Chills Cardiac: [] Chest pain   [] Chest pressure   [] Palpitations   [] Shortness of breath when laying flat   [] Shortness of breath at rest   [] Shortness of breath with exertion. Vascular:  [] Pain in legs with walking   [] Pain in legs at rest   [] Pain in legs when laying flat   [] Claudication   [] Pain in feet when walking  [] Pain in feet at rest  [] Pain in feet when laying flat   [] History of DVT   [] Phlebitis   [] Swelling in legs   [] Varicose veins   [] Non-healing ulcers Pulmonary:   [] Uses home oxygen   [] Productive cough   [] Hemoptysis   [] Wheeze  [x] COPD   [] Asthma Neurologic:  [x] Dizziness  [] Blackouts   [] Seizures   [] History of stroke   [] History of  TIA  [] Aphasia   [] Temporary blindness   [] Dysphagia   [] Weakness or numbness in arms   [] Weakness or numbness in legs Musculoskeletal:  [] Arthritis   [] Joint swelling   [] Joint pain   [] Low back pain Hematologic:  [] Easy bruising  [] Easy bleeding   [] Hypercoagulable state   [] Anemic  [] Hepatitis Gastrointestinal:  [] Blood in stool   []   Vomiting blood  [x] Gastroesophageal reflux/heartburn   [] Abdominal pain Genitourinary:  [] Chronic kidney disease   [] Difficult urination  [] Frequent urination  [] Burning with urination   [] Hematuria Skin:  [] Rashes   [] Ulcers   [] Wounds Psychological:  [] History of anxiety   []  History of major depression.    Physical Exam BP 127/78 (BP Location: Left Arm)   Pulse 71   Resp 15   Wt 83 lb 9.6 oz (37.9 kg)   BMI 15.29 kg/m  Gen:   NAD, thin and somewhat frail appearing.  Head: [place]/AT, + temporalis wasting. Prominent temp pulse not noted. Ear/Nose/Throat: Hearing grossly intact, nares w/o erythema or drainage, oropharynx w/o Erythema/Exudate Eyes: Conjunctiva clear, sclera non-icteric  Neck: trachea midline.  No JVD.  Pulmonary:  Good air movement, respirations not labored, no use of accessory muscles  Cardiac: RRR, no JVD Vascular:  Vessel Right Left  Radial Palpable Palpable                                   Gastrointestinal:. No masses, surgical incisions, or scars. The aortic impulse is significantly enlarged and easily palpable.  Her aortic pulse is actually visible on the left side of her abdomen.  Musculoskeletal: M/S 5/5 throughout.  Extremities without ischemic changes.  No deformity or atrophy. No edema. Neurologic: Sensation grossly intact in extremities.  Symmetrical.  Speech is fluent. Motor exam as listed above. Psychiatric: Judgment intact, Mood & affect appropriate for pt's clinical situation. Dermatologic: No rashes or ulcers noted.  No cellulitis or open wounds.    Radiology CT ABDOMEN PELVIS W CONTRAST  Result Date:  09/20/2022 CLINICAL DATA:  Unintentional weight loss. EXAM: CT ABDOMEN AND PELVIS WITH CONTRAST TECHNIQUE: Multidetector CT imaging of the abdomen and pelvis was performed using the standard protocol following bolus administration of intravenous contrast. RADIATION DOSE REDUCTION: This exam was performed according to the departmental dose-optimization program which includes automated exposure control, adjustment of the mA and/or kV according to patient size and/or use of iterative reconstruction technique. CONTRAST:  67mL OMNIPAQUE IOHEXOL 300 MG/ML  SOLN COMPARISON:  None Available. FINDINGS: Lower chest: Bibasilar subpleural atelectasis/scarring. The visualized lung bases are otherwise clear. There is coronary vascular calcification. No intra-abdominal free air or free fluid. Hepatobiliary: There is a 12 mm cyst in the anterior liver. Additional subcentimeter hepatic hypodense lesions are too small to characterize. No biliary dilatation. Multiple noncalcified gallstones. No pericholecystic fluid or evidence of acute cholecystitis by CT. Pancreas: Unremarkable. No pancreatic ductal dilatation or surrounding inflammatory changes. Spleen: Normal in size without focal abnormality. Adrenals/Urinary Tract: The adrenal glands unremarkable. There is no hydronephrosis on either side. There is symmetric enhancement and excretion of contrast by both kidneys. Subcentimeter right renal hypodense lesion is too small to characterize. The visualized ureters and urinary bladder appear unremarkable. Stomach/Bowel: Evaluation of the bowel is limited in the absence of oral contrast and due to paucity of intra-abdominal fat. There is sigmoid diverticulosis without active inflammatory changes. There is no bowel obstruction or active inflammation. The appendix is normal. Vascular/Lymphatic: Advanced aortoiliac atherosclerotic disease. There is a partially thrombosed fusiform infrarenal abdominal aortic aneurysm measuring approximately 5  cm in diameter and 7.7 cm in craniocaudal length. The aneurysm extends to the level of the bifurcation of the abdominal aorta. No periaortic fluid collection or inflammatory changes. The IVC is unremarkable. No portal venous gas. There is no adenopathy. Reproductive: The uterus is poorly visualized grossly unremarkable.  No adnexal masses. Other: Loss of subcutaneous fat and cachexia. Musculoskeletal: Osteopenia with degenerative changes of the spine. Age indeterminate compression fracture of superior endplate of L1 with approximately 50% loss of vertebral body height and anterior wedging. Correlation with clinical exam and point tenderness recommended. IMPRESSION: 1. No acute intra-abdominal or pelvic pathology. No CT findings to account for the patient's weight loss. 2. Cholelithiasis. 3. Sigmoid diverticulosis. No bowel obstruction. Normal appendix. 4. Partially thrombosed fusiform infrarenal abdominal aortic aneurysm measuring up to 5 cm in diameter. Recommend follow-up CT/MR every 6 months and vascular consultation. This recommendation follows ACR consensus guidelines: White Paper of the ACR Incidental Findings Committee II on Vascular Findings. J Am Coll Radiol 2013; 10:789-794. 5. Age indeterminate compression fracture of superior endplate of L1 with approximately 50% loss of vertebral body height and anterior wedging. Correlation with clinical exam and point tenderness recommended. Electronically Signed   By: Elgie Collard M.D.   On: 09/20/2022 22:26    Labs Recent Results (from the past 2160 hour(s))  I-STAT creatinine     Status: Abnormal   Collection Time: 09/19/22 12:24 PM  Result Value Ref Range   Creatinine, Ser 0.30 (L) 0.44 - 1.00 mg/dL    Assessment/Plan:  AAA (abdominal aortic aneurysm) without rupture (HCC) She recently underwent a CT scan of the abdomen pelvis which I have independently reviewed which demonstrates an approximately 5.3 cm infrarenal abdominal aortic aneurysm.  There  did appear to be some degree of iliac artery occlusive disease present as well.  Interestingly, the official report says a 5 cm abdominal aortic aneurysm but the measurements both by myself as well as the radiologist that are visible on the scan measure between 52 and 53 mm in largest diameter. We had a long discussion today with the patient regarding her aneurysm.  This is well above the threshold size for prophylactic repair, particularly in a tiny woman this represents a quite large aneurysm.  This also had significant growth.  For these reasons, I think fixing this sooner rather than later would be prudent.  We will try to expedite her approval process as best possible and get her scheduled in the near future.  Essential hypertension blood pressure control important in reducing the progression of atherosclerotic disease and aneurysmal growth. On appropriate oral medications.   Ruptured cerebral aneurysm (HCC) Had SAH. S/p repair in the past.   Hyperlipidemia, mixed lipid control important in reducing the progression of atherosclerotic disease.       [name] 10/10/2022, 1:24 PM   This note was created with Dragon medical transcription system.  Any errors from dictation are unintentional.

## 2022-10-10 NOTE — Assessment & Plan Note (Signed)
blood pressure control important in reducing the progression of atherosclerotic disease and aneurysmal growth. On appropriate oral medications.  

## 2022-10-10 NOTE — Patient Instructions (Signed)
Abdominal Aortic Aneurysm Endograft Repair, Care After This sheet gives you information about how to care for yourself after your procedure. Your health care provider may also give you more specific instructions. If you have problems or questions, contact your health care provider. What can I expect after the procedure? After the procedure, it is common to have: Pain or soreness at the incision site. Tiredness (fatigue). Follow these instructions at home: Medicines Take over-the-counter and prescription medicines only as told by your health care provider. If you were prescribed an antibiotic medicine, take it as told by your health care provider. Do not stop using the antibiotic even if you start to feel better. If you are taking blood thinners: Talk with your health care provider before you take any medicines that contain aspirin or NSAIDs, such as ibuprofen. These medicines increase your risk for dangerous bleeding. Take your medicine exactly as told, at the same time every day. Avoid activities that could cause injury or bruising, and follow instructions about how to prevent falls. Wear a medical alert bracelet or carry a card that lists what medicines you take. Incision care  Follow instructions from your health care provider about how to take care of your incisions. Make sure you: Wash your hands with soap and water for at least 20 seconds before and after you change your bandage (dressing). If soap and water are not available, use hand sanitizer. Change your dressing as told by your health care provider. Leave stitches (sutures), skin glue, or adhesive strips in place. These skin closures may need to stay in place for 2 weeks or longer. If adhesive strip edges start to loosen and curl up, you may trim the loose edges. Do not remove adhesive strips completely unless your health care provider tells you to do that. Check your incision area every day for signs of infection. Check  for: Redness, swelling, or more pain. Fluid or blood. Warmth. Pus or a bad smell. Keep the incision area clean and dry. Do not take baths, swim, or use a hot tub until your health care provider approves. Ask your health care provider if you may take showers. You may only be allowed to take sponge baths. Activity  Rest as told by your health care provider. Avoid sitting for a long time without moving. Get up to take short walks every 1-2 hours. This is important to improve blood flow and breathing. Ask for help if you feel weak or unsteady. Do not lift anything that is heavier than 10 lb (4.5 kg), or the limit that you are told, until your health care provider says that it is safe. Do not drive until your health care provider says it is okay. Limit your other activities as told. Return to your normal activities as told by your health care provider. Ask your health care provider what activities are safe for you. Lifestyle  Do not use any products that contain nicotine or tobacco, such as cigarettes, e-cigarettes, and chewing tobacco. These can delay incision healing after surgery. If you need help quitting, ask your health care provider. Make any other lifestyle changes that your health care provider suggests. These may include: Keeping your blood pressure under control. Finding ways to lower stress. Eating healthy foods that are good for your heart, such as vegetables, fruits, and whole grains that add fiber to your diet. Getting regular exercise once your health care provider tells you it is safe to do so. General instructions Drink enough fluid to keep your urine   pale yellow. Before any dental work, tell your dentist about the graft in your aorta. You may need to take antibiotics to prevent your graft from getting infected. Keep all follow-up visits as told by your health care provider. This is important. Contact a health care provider if: You have pain in your abdomen, chest, or  back. You have redness or swelling around an incision. You have more pain around an incision. You have fluid or blood coming from an incision. Your incision feels warm to the touch. You have pus or a bad smell coming from an incision. You have a fever. Get help right away if: You have trouble breathing. You suddenly have pain in your legs, or you have trouble moving either of your legs. You faint, or you feel very light-headed. These symptoms may represent a serious problem that is an emergency. Do not wait to see if the symptoms will go away. Get medical help right away. Call your local emergency services (911 in the [place] Do not drive yourself to the hospital. Summary After abdominal aortic aneurysm endograft repair, it is common to feel tired or have pain or soreness at the incision site. Take all medicines as told by your health care provider. If you were given blood thinners, take them exactly as told by your health care provider. Wash your hands before and after caring for your incision. Leave sutures, skin glue, or adhesive strips in place for 2 weeks or longer. Rest as told. Avoid sitting for a long time without moving. Get up to take short walks every 1-2 hours. Let your health care provider know if you have pain in your abdomen, chest, or back. Get help right away if you have trouble breathing or have sudden pain in your legs. This information is not intended to replace advice given to you by your health care provider. Make sure you discuss any questions you have with your health care provider. Document Revised: 10/16/2019 Document Reviewed: 10/16/2019 Elsevier Patient Education  2023 [employer]  

## 2022-10-10 NOTE — Progress Notes (Signed)
  Patient ID: Autumn Le, female   DOB: 08/22/1946, 76 y.o.   MRN: [id number]  No chief complaint on file.   HPI Autumn Le is a 76 y.o. female.  I am asked to see the patient by Graydon Fofana Whitaker for evaluation of her AAA.  The patient has had a known abdominal aortic aneurysm for several years.  She has a strong history in her family of aneurysm disease with a brother and a father who both had abdominal aortic aneurysms.  She has also undergone treatment for an intracranial aneurysm in the past.  She was followed at another center and as best I can tell her last diameter of her abdominal aortic aneurysm is roughly 4.6 cm.  She has had significant weight loss and her aneurysm is very easily palpable and she complains of some pain and tenderness in her abdomen.  She recently underwent a CT scan of the abdomen pelvis which I have independently reviewed which demonstrates an approximately 5.3 cm infrarenal abdominal aortic aneurysm.  There did appear to be some degree of iliac artery occlusive disease present as well.  Interestingly, the official report says a 5 cm abdominal aortic aneurysm but the measurements both by myself as well as the radiologist that are visible on the scan measure between 52 and 53 mm in largest diameter.   Past Medical History:  Diagnosis Date   AAA (abdominal aortic aneurysm) (HCC)    4.6 cm AAA 01/2020, 6 month f/u rec (Vascular surgeon: Charles Fields, MD)   COPD (chronic obstructive pulmonary disease) (HCC)    patient denies - states she has no issues   Degenerative joint disease of cervical spine    Emphysema lung (HCC)    Eustachian tube dysfunction    GERD (gastroesophageal reflux disease)    HLD (hyperlipidemia)    Hypertension    Meniere's disease in remission, bilateral    Osteopenia    Subarachnoid hemorrhage (HCC)    Vertigo     Past Surgical History:  Procedure Laterality Date   BRAIN SURGERY  2021   CATARACT EXTRACTION W/ INTRAOCULAR LENS   IMPLANT, BILATERAL     EYE SURGERY     IR ANGIO INTRA EXTRACRAN SEL INTERNAL CAROTID BILAT MOD SED  01/23/2020   IR ANGIO INTRA EXTRACRAN SEL INTERNAL CAROTID UNI R MOD SED  06/04/2020   IR ANGIO VERTEBRAL SEL VERTEBRAL UNI L MOD SED  01/23/2020   IR ANGIOGRAM FOLLOW UP STUDY  01/23/2020   IR ANGIOGRAM FOLLOW UP STUDY  01/23/2020   IR ANGIOGRAM FOLLOW UP STUDY  01/23/2020   IR ANGIOGRAM FOLLOW UP STUDY  01/23/2020   IR ANGIOGRAM FOLLOW UP STUDY  06/04/2020   IR ANGIOGRAM FOLLOW UP STUDY  06/04/2020   IR NEURO EACH ADD'L AFTER BASIC UNI RIGHT (MS)  06/04/2020   IR TRANSCATH/EMBOLIZ  01/23/2020   IR TRANSCATH/EMBOLIZ  06/04/2020   IR US GUIDE VASC ACCESS RIGHT  06/04/2020   MYRINGOTOMY WITH TUBE PLACEMENT Left 06/23/2021   Procedure: MYRINGOTOMY WITH TUBE PLACEMENT;  Surgeon: Juengel, Paul, MD;  Location: [facility];  Service: ENT;  Laterality: Left;   RADIOLOGY WITH ANESTHESIA N/A 01/23/2020   Procedure: RADIOLOGY WITH ANESTHESIA;  Surgeon: Nundkumar, Neelesh, MD;  Location: [facility];  Service: Radiology;  Laterality: N/A;   RADIOLOGY WITH ANESTHESIA N/A 06/04/2020   Procedure: Coil embolization of left ACA aneurysm;  Surgeon: Nundkumar, Neelesh, MD;  Location: [facility];  Service: Radiology;  Laterality: N/A;   TONSILLECTOMY         Family History  Problem Relation Age of Onset   Heart disease Mother    Breast cancer Maternal Aunt   Father and brother with AAA   Social History   Tobacco Use   Smoking status: Every Day    Packs/day: 0.25    Years: 20.00    Total pack years: 5.00    Types: Cigarettes    Last attempt to quit: 01/23/2020    Years since quitting: 2.7   Smokeless tobacco: Never  Vaping Use   Vaping Use: Never used  Substance Use Topics   Alcohol use: Yes    Comment: occasional glass of wine    Drug use: Not Currently     Allergies  Allergen Reactions   Latex Hives    bandaids   Nickel     Current Outpatient Medications  Medication Sig Dispense  Refill   acetaminophen (TYLENOL) 325 MG tablet Take 650 mg by mouth every 6 (six) hours as needed for headache (pain).     Ascorbic Acid (VITAMIN C) 1000 MG tablet Take 1,000 mg by mouth daily.     Cholecalciferol 50 MCG (2000 UT) CAPS Take 2,000 Units by mouth at bedtime.      clopidogrel (PLAVIX) 75 MG tablet Take 75 mg by mouth daily.     Docusate Calcium (STOOL SOFTENER PO) Take 1-2 tablets by mouth daily.     fluticasone (FLONASE) 50 MCG/ACT nasal spray Place 2 sprays into both nostrils daily.     fluticasone furoate-vilanterol (BREO ELLIPTA) 100-25 MCG/INH AEPB Inhale 1 puff into the lungs daily.     loratadine (CLARITIN) 10 MG tablet Take 10 mg by mouth at bedtime.     Multiple Vitamins-Minerals (VITAMIN D3 COMPLETE PO) Take by mouth.     Red Yeast Rice Extract 600 MG CAPS Take by mouth.     Calcium Carbonate (CALCIUM 600 PO) Take 600 mg by mouth at bedtime. (Patient not taking: Reported on 03/07/2022)     ezetimibe (ZETIA) 10 MG tablet Take 20 mg by mouth.     topiramate (TOPAMAX) 25 MG tablet Take 25 mg by mouth 2 (two) times daily. (Patient not taking: Reported on 06/22/2021)     No current facility-administered medications for this visit.      REVIEW OF SYSTEMS (Negative unless checked)  Constitutional: [x] Weight loss  [] Fever  [] Chills Cardiac: [] Chest pain   [] Chest pressure   [] Palpitations   [] Shortness of breath when laying flat   [] Shortness of breath at rest   [] Shortness of breath with exertion. Vascular:  [] Pain in legs with walking   [] Pain in legs at rest   [] Pain in legs when laying flat   [] Claudication   [] Pain in feet when walking  [] Pain in feet at rest  [] Pain in feet when laying flat   [] History of DVT   [] Phlebitis   [] Swelling in legs   [] Varicose veins   [] Non-healing ulcers Pulmonary:   [] Uses home oxygen   [] Productive cough   [] Hemoptysis   [] Wheeze  [x] COPD   [] Asthma Neurologic:  [x] Dizziness  [] Blackouts   [] Seizures   [] History of stroke   [] History of  TIA  [] Aphasia   [] Temporary blindness   [] Dysphagia   [] Weakness or numbness in arms   [] Weakness or numbness in legs Musculoskeletal:  [] Arthritis   [] Joint swelling   [] Joint pain   [] Low back pain Hematologic:  [] Easy bruising  [] Easy bleeding   [] Hypercoagulable state   [] Anemic  [] Hepatitis Gastrointestinal:  [] Blood in stool   []   Vomiting blood  [x] Gastroesophageal reflux/heartburn   [] Abdominal pain Genitourinary:  [] Chronic kidney disease   [] Difficult urination  [] Frequent urination  [] Burning with urination   [] Hematuria Skin:  [] Rashes   [] Ulcers   [] Wounds Psychological:  [] History of anxiety   []  History of major depression.    Physical Exam BP 127/78 (BP Location: Left Arm)   Pulse 71   Resp 15   Wt 83 lb 9.6 oz (37.9 kg)   BMI 15.29 kg/m  Gen:   NAD, thin and somewhat frail appearing.  Head: [place]/AT, + temporalis wasting. Prominent temp pulse not noted. Ear/Nose/Throat: Hearing grossly intact, nares w/o erythema or drainage, oropharynx w/o Erythema/Exudate Eyes: Conjunctiva clear, sclera non-icteric  Neck: trachea midline.  No JVD.  Pulmonary:  Good air movement, respirations not labored, no use of accessory muscles  Cardiac: RRR, no JVD Vascular:  Vessel Right Left  Radial Palpable Palpable                                   Gastrointestinal:. No masses, surgical incisions, or scars. The aortic impulse is significantly enlarged and easily palpable.  Her aortic pulse is actually visible on the left side of her abdomen.  Musculoskeletal: M/S 5/5 throughout.  Extremities without ischemic changes.  No deformity or atrophy. No edema. Neurologic: Sensation grossly intact in extremities.  Symmetrical.  Speech is fluent. Motor exam as listed above. Psychiatric: Judgment intact, Mood & affect appropriate for pt's clinical situation. Dermatologic: No rashes or ulcers noted.  No cellulitis or open wounds.    Radiology CT ABDOMEN PELVIS W CONTRAST  Result Date:  09/20/2022 CLINICAL DATA:  Unintentional weight loss. EXAM: CT ABDOMEN AND PELVIS WITH CONTRAST TECHNIQUE: Multidetector CT imaging of the abdomen and pelvis was performed using the standard protocol following bolus administration of intravenous contrast. RADIATION DOSE REDUCTION: This exam was performed according to the departmental dose-optimization program which includes automated exposure control, adjustment of the mA and/or kV according to patient size and/or use of iterative reconstruction technique. CONTRAST:  67mL OMNIPAQUE IOHEXOL 300 MG/ML  SOLN COMPARISON:  None Available. FINDINGS: Lower chest: Bibasilar subpleural atelectasis/scarring. The visualized lung bases are otherwise clear. There is coronary vascular calcification. No intra-abdominal free air or free fluid. Hepatobiliary: There is a 12 mm cyst in the anterior liver. Additional subcentimeter hepatic hypodense lesions are too small to characterize. No biliary dilatation. Multiple noncalcified gallstones. No pericholecystic fluid or evidence of acute cholecystitis by CT. Pancreas: Unremarkable. No pancreatic ductal dilatation or surrounding inflammatory changes. Spleen: Normal in size without focal abnormality. Adrenals/Urinary Tract: The adrenal glands unremarkable. There is no hydronephrosis on either side. There is symmetric enhancement and excretion of contrast by both kidneys. Subcentimeter right renal hypodense lesion is too small to characterize. The visualized ureters and urinary bladder appear unremarkable. Stomach/Bowel: Evaluation of the bowel is limited in the absence of oral contrast and due to paucity of intra-abdominal fat. There is sigmoid diverticulosis without active inflammatory changes. There is no bowel obstruction or active inflammation. The appendix is normal. Vascular/Lymphatic: Advanced aortoiliac atherosclerotic disease. There is a partially thrombosed fusiform infrarenal abdominal aortic aneurysm measuring approximately 5  cm in diameter and 7.7 cm in craniocaudal length. The aneurysm extends to the level of the bifurcation of the abdominal aorta. No periaortic fluid collection or inflammatory changes. The IVC is unremarkable. No portal venous gas. There is no adenopathy. Reproductive: The uterus is poorly visualized grossly unremarkable.  No adnexal masses. Other: Loss of subcutaneous fat and cachexia. Musculoskeletal: Osteopenia with degenerative changes of the spine. Age indeterminate compression fracture of superior endplate of L1 with approximately 50% loss of vertebral body height and anterior wedging. Correlation with clinical exam and point tenderness recommended. IMPRESSION: 1. No acute intra-abdominal or pelvic pathology. No CT findings to account for the patient's weight loss. 2. Cholelithiasis. 3. Sigmoid diverticulosis. No bowel obstruction. Normal appendix. 4. Partially thrombosed fusiform infrarenal abdominal aortic aneurysm measuring up to 5 cm in diameter. Recommend follow-up CT/MR every 6 months and vascular consultation. This recommendation follows ACR consensus guidelines: White Paper of the ACR Incidental Findings Committee II on Vascular Findings. J Am Coll Radiol 2013; 10:789-794. 5. Age indeterminate compression fracture of superior endplate of L1 with approximately 50% loss of vertebral body height and anterior wedging. Correlation with clinical exam and point tenderness recommended. Electronically Signed   By: Elgie Collard M.D.   On: 09/20/2022 22:26    Labs Recent Results (from the past 2160 hour(s))  I-STAT creatinine     Status: Abnormal   Collection Time: 09/19/22 12:24 PM  Result Value Ref Range   Creatinine, Ser 0.30 (L) 0.44 - 1.00 mg/dL    Assessment/Plan:  AAA (abdominal aortic aneurysm) without rupture (HCC) She recently underwent a CT scan of the abdomen pelvis which I have independently reviewed which demonstrates an approximately 5.3 cm infrarenal abdominal aortic aneurysm.  There  did appear to be some degree of iliac artery occlusive disease present as well.  Interestingly, the official report says a 5 cm abdominal aortic aneurysm but the measurements both by myself as well as the radiologist that are visible on the scan measure between 52 and 53 mm in largest diameter. We had a long discussion today with the patient regarding her aneurysm.  This is well above the threshold size for prophylactic repair, particularly in a tiny woman this represents a quite large aneurysm.  This also had significant growth.  For these reasons, I think fixing this sooner rather than later would be prudent.  We will try to expedite her approval process as best possible and get her scheduled in the near future.  Essential hypertension blood pressure control important in reducing the progression of atherosclerotic disease and aneurysmal growth. On appropriate oral medications.   Ruptured cerebral aneurysm (HCC) Had SAH. S/p repair in the past.   Hyperlipidemia, mixed lipid control important in reducing the progression of atherosclerotic disease.       [name] 10/10/2022, 1:24 PM   This note was created with Dragon medical transcription system.  Any errors from dictation are unintentional.

## 2022-10-10 NOTE — Assessment & Plan Note (Signed)
She recently underwent a CT scan of the abdomen pelvis which I have independently reviewed which demonstrates an approximately 5.3 cm infrarenal abdominal aortic aneurysm.  There did appear to be some degree of iliac artery occlusive disease present as well.  Interestingly, the official report says a 5 cm abdominal aortic aneurysm but the measurements both by myself as well as the radiologist that are visible on the scan measure between 52 and 53 mm in largest diameter. We had a long discussion today with the patient regarding her aneurysm.  This is well above the threshold size for prophylactic repair, particularly in a tiny woman this represents a quite large aneurysm.  This also had significant growth.  For these reasons, I think fixing this sooner rather than later would be prudent.  We will try to expedite her approval process as best possible and get her scheduled in the near future.

## 2022-10-10 NOTE — Assessment & Plan Note (Signed)
Had SAH. S/p repair in the past.

## 2022-10-13 HISTORY — PX: ABDOMINAL AORTIC ANEURYSM REPAIR: SUR1152

## 2022-10-13 HISTORY — PX: OTHER SURGICAL HISTORY: SHX169

## 2022-10-18 ENCOUNTER — Telehealth (INDEPENDENT_AMBULATORY_CARE_PROVIDER_SITE_OTHER): Payer: Self-pay

## 2022-10-18 NOTE — Telephone Encounter (Signed)
Spoke with the patient and she is scheduled with Dr. Wyn Quaker for a AAA repair on 11/01/22 with a 9:30 am [facility]. Pre-op phone call is on 10/27/22 between 8-1 pm. Pre-surgical instructions were discussed and will be mailed.

## 2022-10-25 ENCOUNTER — Telehealth (INDEPENDENT_AMBULATORY_CARE_PROVIDER_SITE_OTHER): Payer: Self-pay

## 2022-10-25 NOTE — Telephone Encounter (Addendum)
I attempted to contact the patient to give her an updated arrival time [facility] for her AAA repair with Dr. Wyn Quaker. A message was left for a return call. Patient returned my call and was made aware of the time change for her surgery at the [facility] of 6:45 am. Patient stated she understood.

## 2022-10-26 ENCOUNTER — Other Ambulatory Visit (INDEPENDENT_AMBULATORY_CARE_PROVIDER_SITE_OTHER): Payer: Self-pay | Admitting: Nurse Practitioner

## 2022-10-26 DIAGNOSIS — I7143 Infrarenal abdominal aortic aneurysm, without rupture: Secondary | ICD-10-CM

## 2022-10-27 ENCOUNTER — Encounter
Admission: RE | Admit: 2022-10-27 | Discharge: 2022-10-27 | Disposition: A | Discharge status: Home / Self Care | Payer: Medicare Other | Source: Ambulatory Visit | Attending: Vascular Surgery | Admitting: Vascular Surgery

## 2022-10-27 HISTORY — DX: Tobacco use: Z72.0

## 2022-10-27 HISTORY — DX: Hypokalemia: E87.6

## 2022-10-27 HISTORY — DX: Anemia, unspecified: D64.9

## 2022-10-27 HISTORY — DX: Solitary pulmonary nodule: R91.1

## 2022-10-27 NOTE — Patient Instructions (Addendum)
Your procedure is scheduled on:11-01-22 Wednesday [facility] @ 6:45 am per Dr Driscilla Grammes office  REMEMBER: Instructions that are not followed completely may result in serious medical risk, up to and including death; or upon the discretion of your surgeon and anesthesiologist your surgery may need to be rescheduled.  Do not eat food Or drink any liquids after midnight the night before surgery.  No gum chewing, lozengers or hard candies.  Do NOT take any medication the day of surgery  Use your BREO ELLIPTA Inhaler the morning of surgery  Continue your clopidogrel (PLAVIX) up until the day prior to surgery-Do NOT take the morning of surgery  One week prior to surgery: Stop Anti-inflammatories (NSAIDS) such as Advil, Aleve, Ibuprofen, Motrin, Naproxen, Naprosyn and Aspirin based products such as Excedrin, Goodys Powder, BC Powder.You may however, take Tylenol if needed for pain up until the day of surgery.  Stop ANY OVER THE COUNTER supplements/vitamins NOW (10-27-22) until after surgery (Vitamin C, Calcium, Vitamin D, Red Yeast Rice)  No Alcohol for 24 hours before or after surgery.  No Smoking including e-cigarettes for 24 hours prior to surgery.  No chewable tobacco products for at least 6 hours prior to surgery.  No nicotine patches on the day of surgery.  Do not use any "recreational" drugs for at least a week prior to your surgery.  Please be advised that the combination of cocaine and anesthesia may have negative outcomes, up to and including death. If you test positive for cocaine, your surgery will be cancelled.  On the morning of surgery brush your teeth with toothpaste and water, you may rinse your mouth with mouthwash if you wish. Do not swallow any toothpaste or mouthwash.  Use CHG Soap as directed on instruction sheet.  Do not wear jewelry, make-up, hairpins, clips or nail polish.  Do not wear lotions, powders, or perfumes.   Do not shave body from  the neck down 48 hours prior to surgery just in case you cut yourself which could leave a site for infection.  Also, freshly shaved skin may become irritated if using the CHG soap.  Contact lenses, hearing aids and dentures may not be worn into surgery.  Do not bring valuables to the hospital. [facility] is not responsible for any missing/lost belongings or valuables.   Notify your doctor if there is any change in your medical condition (cold, fever, infection).  Wear comfortable clothing (specific to your surgery type) to the hospital.  After surgery, you can help prevent lung complications by doing breathing exercises.  Take deep breaths and cough every 1-2 hours. Your doctor may order a device called an Incentive Spirometer to help you take deep breaths. When coughing or sneezing, hold a pillow firmly against your incision with both hands. This is called "splinting." Doing this helps protect your incision. It also decreases belly discomfort.  If you are being admitted to the hospital overnight, leave your suitcase in the car. After surgery it may be brought to your room.  If you are being discharged the day of surgery, you will not be allowed to drive home. You will need a responsible adult (18 years or older) to drive you home and stay with you that night.   If you are taking public transportation, you will need to have a responsible adult (18 years or older) with you. Please confirm with your physician that it is acceptable to use public transportation.   Please call the Pre-admissions Testing  Dept. at [phone] if you have any questions about these instructions.  Surgery Visitation Policy:  Patients undergoing a surgery or procedure may have two family members or support persons with them as long as the person is not COVID-19 positive or experiencing its symptoms.   Inpatient Visitation:    Visiting hours are 7 a.m. to 8 p.m. Up to four visitors are allowed at one time in  a patient room. The visitors may rotate out with other people during the day. One designated support person (adult) may remain overnight.  Due to an increase in RSV and influenza rates and associated hospitalizations, children ages 31 and under will not be able to visit patients in [facility]. Masks continue to be strongly recommended.

## 2022-10-30 ENCOUNTER — Telehealth (INDEPENDENT_AMBULATORY_CARE_PROVIDER_SITE_OTHER): Payer: Self-pay

## 2022-10-30 ENCOUNTER — Encounter
Admission: RE | Admit: 2022-10-30 | Discharge: 2022-10-30 | Disposition: A | Discharge status: Home / Self Care | Payer: Medicare Other | Source: Ambulatory Visit | Attending: Vascular Surgery | Admitting: Vascular Surgery

## 2022-10-30 ENCOUNTER — Encounter: Payer: Self-pay | Admitting: Vascular Surgery

## 2022-10-30 DIAGNOSIS — I7143 Infrarenal abdominal aortic aneurysm, without rupture: Secondary | ICD-10-CM | POA: Insufficient documentation

## 2022-10-30 DIAGNOSIS — Z01812 Encounter for preprocedural laboratory examination: Secondary | ICD-10-CM | POA: Insufficient documentation

## 2022-10-30 LAB — CBC WITH DIFFERENTIAL/PLATELET
Abs Immature Granulocytes: 0.02 10*3/uL (ref 0.00–0.07)
Basophils Absolute: 0.1 10*3/uL (ref 0.0–0.1)
Basophils Relative: 1 %
Eosinophils Absolute: 0.2 10*3/uL (ref 0.0–0.5)
Eosinophils Relative: 4 %
HCT: 40.6 % (ref 36.0–46.0)
Hemoglobin: 13.6 g/dL (ref 12.0–15.0)
Immature Granulocytes: 0 %
Lymphocytes Relative: 24 %
Lymphs Abs: 1.5 10*3/uL (ref 0.7–4.0)
MCH: 33.2 pg (ref 26.0–34.0)
MCHC: 33.5 g/dL (ref 30.0–36.0)
MCV: 99 fL (ref 80.0–100.0)
Monocytes Absolute: 0.5 10*3/uL (ref 0.1–1.0)
Monocytes Relative: 8 %
Neutro Abs: 3.9 10*3/uL (ref 1.7–7.7)
Neutrophils Relative %: 63 %
Platelets: 225 10*3/uL (ref 150–400)
RBC: 4.1 MIL/uL (ref 3.87–5.11)
RDW: 12.8 % (ref 11.5–15.5)
WBC: 6.2 10*3/uL (ref 4.0–10.5)
nRBC: 0 % (ref 0.0–0.2)

## 2022-10-30 LAB — TYPE AND SCREEN
ABO/RH(D): A POS
Antibody Screen: NEGATIVE

## 2022-10-30 LAB — BASIC METABOLIC PANEL
Anion gap: 6 (ref 5–15)
BUN: 17 mg/dL (ref 8–23)
CO2: 26 mmol/L (ref 22–32)
Calcium: 9.3 mg/dL (ref 8.9–10.3)
Chloride: 107 mmol/L (ref 98–111)
Creatinine, Ser: 0.59 mg/dL (ref 0.44–1.00)
GFR, Estimated: >60 mL/min (ref >60)
Glucose, Bld: 100 mg/dL — ABNORMAL HIGH (ref 70–99)
Potassium: 3.9 mmol/L (ref 3.5–5.1)
Sodium: 139 mmol/L (ref 135–145)

## 2022-10-30 NOTE — Progress Notes (Signed)
Perioperative Services  Pre-Admission/Anesthesia Testing Clinical Review  Date: 10/31/22  Patient Demographics:  Name: Autumn Le DOB:   12/25/1945 MRN:   [id number]  Planned Surgical Procedure(s):    Case: [id number] Date/Time: 11/01/22 0800   Procedure: ENDOVASCULAR REPAIR/STENT GRAFT   Anesthesia type: General   Diagnosis: Abdominal aortic aneurysm (AAA) without rupture, unspecified part (HCC) [I71.40]   Pre-op diagnosis: Endovascular Stent   GORE   AAA   Location: [facility]   Providers: Annice Needyew, Jason S, MD   NOTE: Available PAT nursing documentation and vital signs have been reviewed. Clinical nursing staff has updated patient's PMH/PSHx, current medication list, and drug allergies/intolerances to ensure comprehensive history available to assist in medical decision making as it pertains to the aforementioned surgical procedure and anticipated anesthetic course. Extensive review of available clinical information performed. [place] PM[facility] and PSHx updated with any diagnoses/procedures that  may have been inadvertently omitted during her intake with the pre-admission testing department's nursing staff.  Clinical Discussion:  Autumn Le is a 76 y.o. female who is submitted for pre-surgical anesthesia review and clearance prior to her undergoing the above procedure. Patient is a Current Smoker (27.5 pack years). Pertinent PMH includes: CAD, PAF, cerebral aneurysms (s/p coil embolization), infrarenal AAA, LEFT ICA aneurysm, aortic atherosclerosis, HTN, HLD, COPD, pulmonary nodules, GERD (no daily Tx), anemia, cervical DDD/DJD, L1 compression fracture, insomnia.   Patient is followed by cardiology Juliann Pares([place], MD). She was last seen in the cardiology clinic on 10/24/2022. At time of PAT consult, notes from cardiology unavailable for review; pending dictation. Information gathered from extensive record review and patient report.   Patient with a history significant for  cerebral aneurysms in the past. She suffered an extensive subarachnoid bleed on 01/23/2020. IR angiography study found that SAH was secondary to a ruptured RIGHT posterior communicating artery aneurysm. Coil emobolization was performed.   3.9 x 4.6 mm anterior communicating artery aneurysm was also noted at the bifurcation of the A2/A3 segments extending into the pericallosal and callosal marginal arteries. Given the rupture of the previously mentioned defect, treatment of this intact defect was delayed allowing for patient stabilization. This area was ultimately treated, with coil embolization, on 06/04/2020.  8.0 x 5.6 mm wide necked aneurysm was also noted arising from the horizontal segment of the cavernous LEFT ICA.   Patient with known infrarenal AAA that was noted on aortic doppler study back in 01/2020. At that time, aneurysmal defect measured 4.2 x 4.6 cm. Area of concern has been serially monitored since discovery. Most recent CT imaging of the abdomen on 09/19/2022 revealed interval increase in size with defect measuring up to 5.0 cm. Aneurysm noted to be partially thrombosed at this point. Imaging personally review by vascular provider who noted that the size of the aneurysmal defect was underestimated at the reported 5.0 cm, and was felt to be larger at 5.2 - 5.3 cm.   Given known vascular disease (AAA) with noted thrombosis, patient has been prescribed daily antiplatelet therapy. She remains on standard dose clopidogrel and is reported to be compliant with prescribed therapy. Blood pressure well controlled at 118/64 mmHg without the use of pharmacological interventions. She is taking ezetimibe + red yeast rice for her HLD diagnosis and ASCVD prevention. Patient is not reported to be diabetic. Patient does not have an OSAH diagnosis. Based on DASI assessment, patient felt to be able to achieve at least 4 METS of physical activity without experiencing any degree of angina/anginal equivalent  symptoms. There do not appear to have been any changes to patient's medication regimen during her visit with cardiology. With upcoming vascular surgery, cardiology wished to further risk stratify patient through non-invasive cardiovascular testing. Patient to follow up with outpatient cardiology following completion of the ordered testing for result review and ongoing management of her cardiovascular diagnoses.   Since being seen by cardiology, patient has undergone the recommended noninvasive studies as follows:  TTE performed on 10/31/2022 - results pending  Myocardial perfusion imaging study performed on 10/31/2022 - results pending   Autumn Le is scheduled for ENDOVASCULAR REPAIR/STENT GRAFT AAA REPAIR on 11/01/2022 with Dr. [name], MD.  Given patient's past medical history significant for cardiovascular diagnoses, presurgical cardiac clearance was sought by the PAT team. Vascular team also requested clearance from patient's PCP. Obtained clearances are as follows:  Per internal medicine Burnadette Pop, MD), "Stable. Patient anticipating AAA repair per Dr. Wyn Quaker. Medically cleared from my standpoint to proceed with surgery. Low risk candidate. Emphasized importance of quitting smoking. Would recommend holding Plavix 5 days prior to surgery".   Cardiology clearance pending. Reached out to office on the afternoon of 11/01/2019 and the morning of 11/02/2019, however there has been no return communication as of 10 AM to my inquiries both via fax and phone.   Again, in review of her medication reconciliation, it is noted that patient is currently on prescribed daily antiplatelet therapy. Per standing orders for this procedure from Dr. Wyn Quaker, the patient will continue her daily clopidogrel dose throughout her perioperative course.   Patient denies previous perioperative complications with anesthesia in the past. In review of the available records, it is noted that patient underwent a general  anesthetic course at [facility] (ASA III) in 06/2021 without documented complications.      10/27/2022    1:00 PM 10/10/2022   11:34 AM 06/14/2022    1:00 PM  Vitals with BMI  Height     Weight 83 lbs 9 oz 83 lbs 10 oz 87 lbs  BMI 15.28  15.91  Systolic  127   Diastolic  78   Pulse  71     Providers/Specialists:   NOTE: Primary physician provider listed below. Patient may have been seen by APP or partner within same practice.   PROVIDER ROLE / SPECIALTY LAST Shanna Cisco, MD Vascular Surgery (Surgeon) 10/10/2022  Marisue Ivan, MD Primary Care Provider 10/13/2022  Rudean Hitt, MD Cardiology 10/24/2022   Allergies:  Latex and Nickel  Current Home Medications:   No current facility-administered medications for this encounter.    acetaminophen (TYLENOL) 325 MG tablet   Ascorbic Acid (VITAMIN C) 1000 MG tablet   Calcium Carbonate (CALCIUM 600 PO)   Cholecalciferol 50 MCG (2000 UT) CAPS   clopidogrel (PLAVIX) 75 MG tablet   Docusate Calcium (STOOL SOFTENER PO)   ezetimibe (ZETIA) 10 MG tablet   fluticasone (FLONASE) 50 MCG/ACT nasal spray   fluticasone furoate-vilanterol (BREO ELLIPTA) 100-25 MCG/INH AEPB   loratadine (CLARITIN) 10 MG tablet   Multiple Vitamins-Minerals (VITAMIN D3 COMPLETE PO)   Red Yeast Rice Extract 600 MG CAPS   History:   Past Medical History:  Diagnosis Date   AAA (abdominal aortic aneurysm) (HCC)    a.) aortic US 02/05/2020: infrarenal AAA measuring 4.2 x 4.6 cm; b.) AAA duplex 08/12/2020: measured 4.3 cm; c.) AAA duplex 02/23/2021: measured 4.53 x 4.31 cm; d.) AAA doppler 03/07/2022: measured 4.53 x 4.31 cm; e.) LD chest CT 06/14/2022:  measured 4.6 cm; f.) CT abd: partially thrombosed measuring up to 5.0 cm (overread by vascular surgery suggested size actually larger at 5.2-5.3 cm)   Anemia    Aneurysm of cavernous portion of left internal carotid artery 01/23/2020   a.) IR angio 01/23/2020: 8.0 x 5.6 mm wide  necked aneurysm arising from the horizontal segment of the cavernous LEFT ICA   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   a.) IR angio 01/23/2020: 3.9 x 4.6 mm aneurysm at the bifurcation of the A2/A3 segment into the pericallosal and callosal marginal arteries; b.) s/p coli embolization 06/04/2020   Aortic atherosclerosis (HCC)    CAD (coronary artery disease)    Compression fracture of L1 lumbar vertebra (HCC)    COPD (chronic obstructive pulmonary disease) (HCC)    DDD (degenerative disc disease), cervical    Degenerative joint disease of cervical spine    Diverticulosis    Emphysema lung (HCC)    Eustachian tube dysfunction    GERD (gastroesophageal reflux disease)    HLD (hyperlipidemia)    HTN (hypertension)    Hypokalemia    Insomnia    Long term current use of antithrombotics/antiplatelets    a.) clopidogrel   Lung nodule    Meniere's disease in remission, bilateral    Osteopenia    PAF (paroxysmal atrial fibrillation) (HCC)    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   a.) IR angio 01/23/2020: 6.2 x 6.1 x 8.9 ruptured aneurysm arising from the origion of the RIGHT PCA projecting laterally --> coil embolization performed 01/23/2020   Subarachnoid 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 (HCC) 01/23/2020   a.) CT head 01/23/2020: extensive SAH about the RIGHT cerebral hemisphere and basal cisterns secondary to RIGHT PCA aneurysm rupture   Tobacco abuse    Unintentional weight loss    a.) reports eating well; eats 4-5 x/day and still feels hungry; b.) weight decreasing: 104 lb (02/2021), 88 lb (02/2022), 86 lb (08/2022), 83 lb (10/2022)   Vertigo    Past Surgical History:  Procedure Laterality Date   BRAIN SURGERY  2021   CATARACT EXTRACTION W/ INTRAOCULAR LENS  IMPLANT, BILATERAL     EYE SURGERY     IR ANGIO INTRA EXTRACRAN SEL INTERNAL CAROTID BILAT MOD SED  01/23/2020   IR ANGIO INTRA EXTRACRAN SEL  INTERNAL CAROTID UNI R MOD SED  06/04/2020   IR ANGIO VERTEBRAL SEL VERTEBRAL UNI L MOD SED  01/23/2020   IR ANGIOGRAM FOLLOW UP STUDY  01/23/2020   IR ANGIOGRAM FOLLOW UP STUDY  01/23/2020   IR ANGIOGRAM FOLLOW UP STUDY  01/23/2020   IR ANGIOGRAM FOLLOW UP STUDY  01/23/2020   IR ANGIOGRAM FOLLOW UP STUDY  06/04/2020   IR ANGIOGRAM FOLLOW UP STUDY  06/04/2020   IR NEURO EACH ADD'L AFTER BASIC UNI RIGHT (MS)  06/04/2020   IR TRANSCATH/EMBOLIZ  01/23/2020   IR TRANSCATH/EMBOLIZ  06/04/2020   IR US GUIDE VASC ACCESS RIGHT  06/04/2020   MYRINGOTOMY WITH TUBE PLACEMENT Left 06/23/2021   Procedure: MYRINGOTOMY WITH TUBE PLACEMENT;  Surgeon: Vernie Murders, MD;  [facility]: [facility];  Service: ENT;  Laterality: Left;   RADIOLOGY WITH ANESTHESIA N/A 01/23/2020   Procedure: RADIOLOGY WITH ANESTHESIA;  Surgeon: Lisbeth Renshaw, MD;  Location: [facility] OR;  Service: Radiology;  Laterality: N/A;   RADIOLOGY WITH ANESTHESIA N/A 06/04/2020   Procedure: Coil embolization of left ACA aneurysm;  Surgeon: Lisbeth Renshaw, MD;  Location: [facility] OR;  Service: Radiology;  Laterality:  N/A;   TONSILLECTOMY     Family History  Problem Relation Age of Onset   Heart disease Mother    Breast cancer Maternal Aunt    Social History   Tobacco Use   Smoking status: Every Day    Packs/day: 0.50    Years: 55.00    Total pack years: 27.50    Types: Cigarettes   Smokeless tobacco: Never  Vaping Use   Vaping Use: Never used  Substance Use Topics   Alcohol use: Not Currently    Comment: occasional glass of wine    Drug use: Not Currently    Pertinent Clinical Results:  LABS: Labs reviewed: Acceptable for surgery.  Hospital Outpatient Visit on 10/30/2022  Component Date Value Ref Range Status   ABO/RH(D) 10/30/2022 A POS   Final   Antibody Screen 10/30/2022 NEG   Final   Sample Expiration 10/30/2022 11/13/2022,2359   Final   Extend sample reason 10/30/2022    Final                   Value:NO  TRANSFUSIONS OR PREGNANCY IN THE PAST 3 MONTHS Performed at [facility], [address] [place], [place] [address]    WBC 10/30/2022 6.2  4.0 - 10.5 K/uL Final   RBC 10/30/2022 4.10  3.87 - 5.11 MIL/uL Final   Hemoglobin 10/30/2022 13.6  12.0 - 15.0 g/dL Final   HCT 26/71/2458 40.6  36.0 - 46.0 % Final   MCV 10/30/2022 99.0  80.0 - 100.0 fL Final   MCH 10/30/2022 33.2  26.0 - 34.0 pg Final   MCHC 10/30/2022 33.5  30.0 - 36.0 g/dL Final   RDW 09/98/3382 12.8  11.5 - 15.5 % Final   Platelets 10/30/2022 225  150 - 400 K/uL Final   nRBC 10/30/2022 0.0  0.0 - 0.2 % Final   Neutrophils Relative % 10/30/2022 63  % Final   Neutro Abs 10/30/2022 3.9  1.7 - 7.7 K/uL Final   Lymphocytes Relative 10/30/2022 24  % Final   Lymphs Abs 10/30/2022 1.5  0.7 - 4.0 K/uL Final   Monocytes Relative 10/30/2022 8  % Final   Monocytes Absolute 10/30/2022 0.5  0.1 - 1.0 K/uL Final   Eosinophils Relative 10/30/2022 4  % Final   Eosinophils Absolute 10/30/2022 0.2  0.0 - 0.5 K/uL Final   Basophils Relative 10/30/2022 1  % Final   Basophils Absolute 10/30/2022 0.1  0.0 - 0.1 K/uL Final   Immature Granulocytes 10/30/2022 0  % Final   Abs Immature Granulocytes 10/30/2022 0.02  0.00 - 0.07 K/uL Final   Performed at [facility], [address] [place], [place] [address]   Sodium 10/30/2022 139  135 - 145 mmol/L Final   Potassium 10/30/2022 3.9  3.5 - 5.1 mmol/L Final   Chloride 10/30/2022 107  98 - 111 mmol/L Final   CO2 10/30/2022 26  22 - 32 mmol/L Final   Glucose, Bld 10/30/2022 100 (H)  70 - 99 mg/dL Final   Glucose reference range applies only to samples taken after fasting for at least 8 hours.   BUN 10/30/2022 17  8 - 23 mg/dL Final   Creatinine, Ser 10/30/2022 0.59  0.44 - 1.00 mg/dL Final   Calcium 76/73/4193 9.3  8.9 - 10.3 mg/dL Final   GFR, Estimated 10/30/2022 >60  >60 mL/min Final   Comment: (NOTE) Calculated using the CKD-EPI Creatinine Equation (2021)    Anion gap  10/30/2022 6  5 -  15 Final   Performed at [facility], [address] [place], [place] [address]    ECG: Date: 10/24/2022 Time ECG obtained: 1040 AM Rate: 68 bpm Rhythm: normal sinus Axis (leads I and aVF): Normal Intervals: PR 128 ms. QRS 74 ms. QTc 416 ms. ST segment and T wave changes: No evidence of acute ST segment elevation or depression. Evidence of an age undetermined septal infarct noted. Comparison: Similar to previous tracing obtained on 06/07/2020   IMAGING / PROCEDURES: CT ABDOMEN PELVIS W CONTRAST performed on 09/19/2022 No acute intra-abdominal or pelvic pathology. No CT findings to account for the patient's weight loss. Cholelithiasis. Sigmoid diverticulosis. No bowel obstruction. Normal appendix. Partially thrombosed fusiform infrarenal abdominal aortic aneurysm measuring up to 5 cm in diameter. Recommend follow-up CT/MR every 6 months and vascular consultation. Age indeterminate compression fracture of superior endplate of L1 with approximately 50% loss of vertebral body height and anterior wedging. Correlation with clinical exam and point tenderness recommended.  CT CHEST LUNG CA SCREEN LOW DOSE W/O CM performed on 06/14/2022 Lung-RADS 2, benign appearance or behavior. Continue annual screening with low-dose chest CT without contrast in 12 months. Aortic atherosclerosis  Emphysema  Coronary artery calcifications Known infrarenal abdominal aortic aneurysm which measured 4.2 x 4.6 cm on abdominal sonogram from 02/05/2020. Recommend follow-up every 6 months and vascular consultation.  Age-indeterminate L1 vertebral body compression deformity with loss of approximately 40% of the anterior vertebral body height. New from 02/04/2020.  AAA DUPLEX performed on 03/07/2022 There is evidence of abnormal dilatation of the mid and distal Abdominal aorta.  The largest aortic diameter remains essentially unchanged compared to prior exam.  Previous diameter  measurement was 4.53 x 4.31 cm obtained on 02/23/21.   MR BRAIN WO CONTRAST performed on 06/06/2020 Multifocal acute ischemia within the right anterior cerebral artery territory and posterior limb of the right internal capsule. No hemorrhage or mass effect. Status post right communicating segment ICA and right ACA A2 segment aneurysm coiling. Medially projecting aneurysm of the cavernous segment of the left ICA.  Impression and Plan:  Autumn Le has been referred for pre-anesthesia review and clearance prior to her undergoing the planned anesthetic and procedural courses. Available labs, pertinent testing, and imaging results were personally reviewed by me. This patient has been had noninvasive CV testing yesterday. As of 10 am today (11/02/2019) results and clearance pending. We did receive clearance from internal medicine advising that patient is at LOW risk for the procedure.  Based on clinical review performed today (10/31/22), barring any significant acute changes in the patient's overall condition, it is anticipated that she will be able to proceed with the planned surgical intervention. Any acute changes in clinical condition may necessitate her procedure being postponed and/or cancelled. Patient will meet with anesthesia team (MD and/or CRNA) on the day of her procedure for preoperative evaluation/assessment. Questions regarding anesthetic course will be fielded at that time.   Pre-surgical instructions were reviewed with the patient during her PAT appointment and questions were fielded by PAT clinical staff. Patient was advised that if any questions or concerns arise prior to her procedure then she should return a call to PAT and/or her surgeon's office to discuss.  Quentin Mulling, MSN, APRN, FNP-C, CEN [facility]  Peri-operative Services Nurse Practitioner Phone: [phone] Fax: [phone] 10/31/22 5:25 PM  NOTE: This note has been prepared using Dragon  dictation software. Despite my best ability to proofread, there is always the potential that unintentional  transcriptional errors may still occur from this process.

## 2022-10-30 NOTE — Telephone Encounter (Signed)
Patient sister called concerned about the patient being able to take care of herself after her surgery. I advised that this information be brought up after her surgery so that if she needs to go to a rehab facility it can be arranged at that time.

## 2022-10-31 ENCOUNTER — Encounter: Payer: Self-pay | Admitting: Vascular Surgery

## 2022-11-01 ENCOUNTER — Inpatient Hospital Stay
Admission: RE | Admit: 2022-11-01 | Discharge: 2022-11-09 | DRG: 271 | Disposition: A | Discharge status: Skilled Nursing Facility | Payer: Medicare Other | Attending: Vascular Surgery | Admitting: Vascular Surgery

## 2022-11-01 ENCOUNTER — Encounter
Admission: RE | Disposition: A | Discharge status: Skilled Nursing Facility | Payer: Self-pay | Source: Home / Self Care | Attending: Vascular Surgery

## 2022-11-01 ENCOUNTER — Inpatient Hospital Stay: Payer: Medicare Other | Admitting: Urgent Care

## 2022-11-01 ENCOUNTER — Encounter: Payer: Self-pay | Admitting: Vascular Surgery

## 2022-11-01 ENCOUNTER — Other Ambulatory Visit: Payer: Self-pay

## 2022-11-01 DIAGNOSIS — Z8249 Family history of ischemic heart disease and other diseases of the circulatory system: Secondary | ICD-10-CM | POA: Diagnosis not present

## 2022-11-01 DIAGNOSIS — I700 Atherosclerosis of aorta: Secondary | ICD-10-CM | POA: Diagnosis present

## 2022-11-01 DIAGNOSIS — E782 Mixed hyperlipidemia: Secondary | ICD-10-CM | POA: Diagnosis present

## 2022-11-01 DIAGNOSIS — Z91048 Other nonmedicinal substance allergy status: Secondary | ICD-10-CM

## 2022-11-01 DIAGNOSIS — M5030 Other cervical disc degeneration, unspecified cervical region: Secondary | ICD-10-CM | POA: Diagnosis present

## 2022-11-01 DIAGNOSIS — J439 Emphysema, unspecified: Secondary | ICD-10-CM | POA: Diagnosis present

## 2022-11-01 DIAGNOSIS — M8580 Other specified disorders of bone density and structure, unspecified site: Secondary | ICD-10-CM | POA: Diagnosis present

## 2022-11-01 DIAGNOSIS — Z9842 Cataract extraction status, left eye: Secondary | ICD-10-CM | POA: Diagnosis not present

## 2022-11-01 DIAGNOSIS — Z79899 Other long term (current) drug therapy: Secondary | ICD-10-CM

## 2022-11-01 DIAGNOSIS — M47812 Spondylosis without myelopathy or radiculopathy, cervical region: Secondary | ICD-10-CM | POA: Diagnosis present

## 2022-11-01 DIAGNOSIS — I70202 Unspecified atherosclerosis of native arteries of extremities, left leg: Secondary | ICD-10-CM | POA: Diagnosis present

## 2022-11-01 DIAGNOSIS — I2510 Atherosclerotic heart disease of native coronary artery without angina pectoris: Secondary | ICD-10-CM | POA: Diagnosis present

## 2022-11-01 DIAGNOSIS — F17210 Nicotine dependence, cigarettes, uncomplicated: Secondary | ICD-10-CM | POA: Diagnosis present

## 2022-11-01 DIAGNOSIS — Z9841 Cataract extraction status, right eye: Secondary | ICD-10-CM | POA: Diagnosis not present

## 2022-11-01 DIAGNOSIS — I10 Essential (primary) hypertension: Secondary | ICD-10-CM | POA: Diagnosis present

## 2022-11-01 DIAGNOSIS — I480 Paroxysmal atrial fibrillation: Secondary | ICD-10-CM | POA: Diagnosis present

## 2022-11-01 DIAGNOSIS — I714 Abdominal aortic aneurysm, without rupture, unspecified: Principal | ICD-10-CM | POA: Diagnosis present

## 2022-11-01 DIAGNOSIS — Z538 Procedure and treatment not carried out for other reasons: Secondary | ICD-10-CM | POA: Diagnosis not present

## 2022-11-01 DIAGNOSIS — Z961 Presence of intraocular lens: Secondary | ICD-10-CM | POA: Diagnosis present

## 2022-11-01 DIAGNOSIS — G4700 Insomnia, unspecified: Secondary | ICD-10-CM | POA: Diagnosis present

## 2022-11-01 DIAGNOSIS — Z681 Body mass index (BMI) 19 or less, adult: Secondary | ICD-10-CM | POA: Diagnosis not present

## 2022-11-01 DIAGNOSIS — Z95828 Presence of other vascular implants and grafts: Secondary | ICD-10-CM | POA: Diagnosis not present

## 2022-11-01 DIAGNOSIS — R634 Abnormal weight loss: Secondary | ICD-10-CM | POA: Diagnosis present

## 2022-11-01 DIAGNOSIS — Z7902 Long term (current) use of antithrombotics/antiplatelets: Secondary | ICD-10-CM

## 2022-11-01 DIAGNOSIS — K219 Gastro-esophageal reflux disease without esophagitis: Secondary | ICD-10-CM | POA: Diagnosis present

## 2022-11-01 DIAGNOSIS — I70222 Atherosclerosis of native arteries of extremities with rest pain, left leg: Secondary | ICD-10-CM | POA: Diagnosis not present

## 2022-11-01 DIAGNOSIS — Z91040 Latex allergy status: Secondary | ICD-10-CM | POA: Diagnosis not present

## 2022-11-01 DIAGNOSIS — Z9889 Other specified postprocedural states: Secondary | ICD-10-CM | POA: Diagnosis not present

## 2022-11-01 DIAGNOSIS — I701 Atherosclerosis of renal artery: Secondary | ICD-10-CM

## 2022-11-01 HISTORY — DX: Atherosclerosis of aorta: I70.0

## 2022-11-01 HISTORY — DX: Atherosclerotic heart disease of native coronary artery without angina pectoris: I25.10

## 2022-11-01 HISTORY — DX: Diverticulosis of intestine, part unspecified, without perforation or abscess without bleeding: K57.90

## 2022-11-01 HISTORY — DX: Wedge compression fracture of first lumbar vertebra, initial encounter for closed fracture: S32.010A

## 2022-11-01 HISTORY — DX: Paroxysmal atrial fibrillation: I48.0

## 2022-11-01 HISTORY — PX: ENDOVASCULAR STENT GRAFT (AAA): CATH118280

## 2022-11-01 HISTORY — DX: Other cervical disc degeneration, unspecified cervical region: M50.30

## 2022-11-01 HISTORY — DX: Abnormal weight loss: R63.4

## 2022-11-01 HISTORY — DX: Insomnia, unspecified: G47.00

## 2022-11-01 HISTORY — DX: Long term (current) use of antithrombotics/antiplatelets: Z79.02

## 2022-11-01 HISTORY — DX: Essential (primary) hypertension: I10

## 2022-11-01 LAB — CBC
HCT: 32.9 % — ABNORMAL LOW (ref 36.0–46.0)
Hemoglobin: 10.7 g/dL — ABNORMAL LOW (ref 12.0–15.0)
MCH: 32.3 pg (ref 26.0–34.0)
MCHC: 32.5 g/dL (ref 30.0–36.0)
MCV: 99.4 fL (ref 80.0–100.0)
Platelets: 194 10*3/uL (ref 150–400)
RBC: 3.31 MIL/uL — ABNORMAL LOW (ref 3.87–5.11)
RDW: 12.9 % (ref 11.5–15.5)
WBC: 10.8 10*3/uL — ABNORMAL HIGH (ref 4.0–10.5)
nRBC: 0 % (ref 0.0–0.2)

## 2022-11-01 LAB — BASIC METABOLIC PANEL
Anion gap: 7 (ref 5–15)
BUN: 13 mg/dL (ref 8–23)
CO2: 22 mmol/L (ref 22–32)
Calcium: 8.1 mg/dL — ABNORMAL LOW (ref 8.9–10.3)
Chloride: 110 mmol/L (ref 98–111)
Creatinine, Ser: 0.45 mg/dL (ref 0.44–1.00)
GFR, Estimated: >60 mL/min (ref >60)
Glucose, Bld: 189 mg/dL — ABNORMAL HIGH (ref 70–99)
Potassium: 3.7 mmol/L (ref 3.5–5.1)
Sodium: 139 mmol/L (ref 135–145)

## 2022-11-01 LAB — PROTIME-INR
INR: 1.2 (ref 0.8–1.2)
Prothrombin Time: 14.9 seconds (ref 11.4–15.2)

## 2022-11-01 LAB — APTT: aPTT: 147 seconds — ABNORMAL HIGH (ref 24–36)

## 2022-11-01 LAB — MAGNESIUM: Magnesium: 1.8 mg/dL (ref 1.7–2.4)

## 2022-11-01 LAB — MRSA NEXT GEN BY PCR, NASAL: MRSA by PCR Next Gen: NOT DETECTED

## 2022-11-01 LAB — GLUCOSE, CAPILLARY: Glucose-Capillary: 195 mg/dL — ABNORMAL HIGH (ref 70–99)

## 2022-11-01 SURGERY — ENDOVASCULAR STENT GRAFT (AAA)
Anesthesia: General

## 2022-11-01 MED ORDER — HEPARIN SODIUM (PORCINE) 1000 UNIT/ML IJ SOLN
INTRAMUSCULAR | Status: DC | PRN
  Administered 2022-11-01: 5000 [IU] via INTRAVENOUS

## 2022-11-01 MED ORDER — ORAL CARE MOUTH RINSE: 15.0000 mL | Freq: Once | OROMUCOSAL | Status: DC

## 2022-11-01 MED ORDER — IODIXANOL 320 MG/ML IV SOLN
INTRAVENOUS | Status: DC | PRN
  Administered 2022-11-01: 85 mL

## 2022-11-01 MED ORDER — CEFAZOLIN SODIUM-DEXTROSE 2-4 GM/100ML-% IV SOLN
2.0000 g | INTRAVENOUS | Status: AC
  Administered 2022-11-01: 2 g via INTRAVENOUS

## 2022-11-01 MED ORDER — LABETALOL HCL 5 MG/ML IV SOLN
10.0000 mg | INTRAVENOUS | Status: DC | PRN
  Administered 2022-11-01: 10 mg via INTRAVENOUS

## 2022-11-01 MED ORDER — HYDROMORPHONE HCL 1 MG/ML IJ SOLN
1.0000 mg | INTRAMUSCULAR | Status: AC | PRN
  Administered 2022-11-02: 1 mg via INTRAVENOUS
  Filled 2022-11-01: qty 1

## 2022-11-01 MED ORDER — OXYCODONE-ACETAMINOPHEN 5-325 MG PO TABS
ORAL_TABLET | ORAL | Status: AC
  Filled 2022-11-01: qty 1

## 2022-11-01 MED ORDER — VITAMIN D 25 MCG (1000 UNIT) PO TABS
2000.0000 [IU] | ORAL_TABLET | Freq: Every day | ORAL | Status: DC
  Administered 2022-11-01 – 2022-11-08 (×8): 2000 [IU] via ORAL
  Filled 2022-11-01 (×8): qty 2

## 2022-11-01 MED ORDER — ROCURONIUM BROMIDE 100 MG/10ML IV SOLN
INTRAVENOUS | Status: DC | PRN
  Administered 2022-11-01: 10 mg via INTRAVENOUS
  Administered 2022-11-01: 50 mg via INTRAVENOUS

## 2022-11-01 MED ORDER — EPHEDRINE SULFATE (PRESSORS) 50 MG/ML IJ SOLN
INTRAMUSCULAR | Status: DC | PRN
  Administered 2022-11-01: 5 mg via INTRAVENOUS

## 2022-11-01 MED ORDER — CALCIUM CARBONATE 1250 (500 CA) MG PO TABS
1.0000 | ORAL_TABLET | Freq: Every day | ORAL | Status: DC
  Administered 2022-11-01 – 2022-11-08 (×8): 1250 mg via ORAL
  Filled 2022-11-01 (×7): qty 1

## 2022-11-01 MED ORDER — HYDROMORPHONE HCL 1 MG/ML IJ SOLN
INTRAMUSCULAR | Status: AC
  Filled 2022-11-01: qty 1

## 2022-11-01 MED ORDER — ONDANSETRON HCL 4 MG/2ML IJ SOLN
INTRAMUSCULAR | Status: DC | PRN
  Administered 2022-11-01 (×2): 4 mg via INTRAVENOUS

## 2022-11-01 MED ORDER — SODIUM CHLORIDE 0.9 % IV SOLN: INTRAVENOUS | Status: DC

## 2022-11-01 MED ORDER — ASPIRIN 81 MG PO TBEC
81.0000 mg | DELAYED_RELEASE_TABLET | Freq: Every day | ORAL | Status: DC
  Administered 2022-11-02 – 2022-11-09 (×8): 81 mg via ORAL
  Filled 2022-11-01 (×9): qty 1

## 2022-11-01 MED ORDER — PHENOL 1.4 % MT LIQD: 1.0000 | OROMUCOSAL | Status: DC | PRN

## 2022-11-01 MED ORDER — FAMOTIDINE IN NACL 20-0.9 MG/50ML-% IV SOLN
20.0000 mg | Freq: Two times a day (BID) | INTRAVENOUS | Status: DC
  Administered 2022-11-01 – 2022-11-04 (×5): 20 mg via INTRAVENOUS
  Filled 2022-11-01 (×6): qty 50

## 2022-11-01 MED ORDER — CEFAZOLIN SODIUM-DEXTROSE 2-4 GM/100ML-% IV SOLN
2.0000 g | Freq: Three times a day (TID) | INTRAVENOUS | Status: AC
  Administered 2022-11-01 – 2022-11-02 (×2): 2 g via INTRAVENOUS
  Filled 2022-11-01 (×2): qty 100

## 2022-11-01 MED ORDER — METOPROLOL TARTRATE 5 MG/5ML IV SOLN: 2.0000 mg | INTRAVENOUS | Status: DC | PRN

## 2022-11-01 MED ORDER — ACETAMINOPHEN 325 MG PO TABS: 650.0000 mg | ORAL_TABLET | Freq: Four times a day (QID) | ORAL | Status: DC | PRN

## 2022-11-01 MED ORDER — ACETAMINOPHEN 325 MG PO TABS
325.0000 mg | ORAL_TABLET | ORAL | Status: DC | PRN
  Administered 2022-11-02 – 2022-11-08 (×2): 650 mg via ORAL
  Filled 2022-11-01 (×3): qty 2

## 2022-11-01 MED ORDER — DOCUSATE SODIUM 100 MG PO CAPS
100.0000 mg | ORAL_CAPSULE | Freq: Two times a day (BID) | ORAL | Status: DC
  Administered 2022-11-01 – 2022-11-09 (×14): 100 mg via ORAL
  Filled 2022-11-01 (×16): qty 1

## 2022-11-01 MED ORDER — FAMOTIDINE 20 MG PO TABS
20.0000 mg | ORAL_TABLET | Freq: Once | ORAL | Status: AC
  Administered 2022-11-01: 20 mg via ORAL

## 2022-11-01 MED ORDER — CEFAZOLIN SODIUM-DEXTROSE 2-4 GM/100ML-% IV SOLN
INTRAVENOUS | Status: AC
  Filled 2022-11-01: qty 100

## 2022-11-01 MED ORDER — ONDANSETRON HCL 4 MG/2ML IJ SOLN
4.0000 mg | Freq: Four times a day (QID) | INTRAMUSCULAR | Status: DC | PRN
  Filled 2022-11-01 (×2): qty 2

## 2022-11-01 MED ORDER — MAGNESIUM SULFATE 2 GM/50ML IV SOLN: 2.0000 g | Freq: Every day | INTRAVENOUS | Status: DC | PRN

## 2022-11-01 MED ORDER — CLOPIDOGREL BISULFATE 75 MG PO TABS
75.0000 mg | ORAL_TABLET | Freq: Every day | ORAL | Status: DC
  Administered 2022-11-01 – 2022-11-09 (×8): 75 mg via ORAL
  Filled 2022-11-01 (×8): qty 1

## 2022-11-01 MED ORDER — ONDANSETRON HCL 4 MG/2ML IJ SOLN: 4.0000 mg | Freq: Once | INTRAMUSCULAR | Status: DC | PRN

## 2022-11-01 MED ORDER — LACTATED RINGERS IV SOLN: INTRAVENOUS | Status: DC

## 2022-11-01 MED ORDER — ORAL CARE MOUTH RINSE: 15.0000 mL | OROMUCOSAL | Status: DC | PRN

## 2022-11-01 MED ORDER — CHLORHEXIDINE GLUCONATE CLOTH 2 % EX PADS
6.0000 | MEDICATED_PAD | Freq: Once | CUTANEOUS | Status: AC
  Administered 2022-11-01: 6 via TOPICAL

## 2022-11-01 MED ORDER — HEPARIN (PORCINE) 25000 UT/250ML-% IV SOLN
650.0000 [IU]/h | INTRAVENOUS | Status: DC
  Administered 2022-11-01: 500 [IU]/h via INTRAVENOUS
  Filled 2022-11-01: qty 250

## 2022-11-01 MED ORDER — LABETALOL HCL 5 MG/ML IV SOLN
INTRAVENOUS | Status: AC
  Filled 2022-11-01: qty 4

## 2022-11-01 MED ORDER — DEXAMETHASONE SODIUM PHOSPHATE 10 MG/ML IJ SOLN
INTRAMUSCULAR | Status: DC | PRN
  Administered 2022-11-01: 5 mg via INTRAVENOUS

## 2022-11-01 MED ORDER — ADULT MULTIVITAMIN W/MINERALS CH
ORAL_TABLET | Freq: Every day | ORAL | Status: DC
  Administered 2022-11-02 – 2022-11-09 (×7): 1 via ORAL
  Filled 2022-11-01 (×7): qty 1

## 2022-11-01 MED ORDER — ESMOLOL HCL 100 MG/10ML IV SOLN
INTRAVENOUS | Status: DC | PRN
  Administered 2022-11-01 (×2): 20 mg via INTRAVENOUS

## 2022-11-01 MED ORDER — ROSUVASTATIN CALCIUM 5 MG PO TABS
5.0000 mg | ORAL_TABLET | Freq: Every day | ORAL | Status: DC
  Administered 2022-11-01 – 2022-11-09 (×8): 5 mg via ORAL
  Filled 2022-11-01 (×8): qty 1

## 2022-11-01 MED ORDER — LIDOCAINE HCL (CARDIAC) PF 100 MG/5ML IV SOSY
PREFILLED_SYRINGE | INTRAVENOUS | Status: DC | PRN
  Administered 2022-11-01: 50 mg via INTRAVENOUS

## 2022-11-01 MED ORDER — CHLORHEXIDINE GLUCONATE CLOTH 2 % EX PADS: 6.0000 | MEDICATED_PAD | Freq: Once | CUTANEOUS | Status: AC

## 2022-11-01 MED ORDER — PHENYLEPHRINE 80 MCG/ML (10ML) SYRINGE FOR IV PUSH (FOR BLOOD PRESSURE SUPPORT)
PREFILLED_SYRINGE | INTRAVENOUS | Status: DC | PRN
  Administered 2022-11-01: 80 ug via INTRAVENOUS

## 2022-11-01 MED ORDER — CHLORHEXIDINE GLUCONATE 0.12 % MT SOLN
15.0000 mL | Freq: Once | OROMUCOSAL | Status: DC
  Filled 2022-11-01: qty 15

## 2022-11-01 MED ORDER — SUGAMMADEX SODIUM 200 MG/2ML IV SOLN
INTRAVENOUS | Status: DC | PRN
  Administered 2022-11-01: 200 mg via INTRAVENOUS

## 2022-11-01 MED ORDER — LORATADINE 10 MG PO TABS
10.0000 mg | ORAL_TABLET | Freq: Every day | ORAL | Status: DC
  Administered 2022-11-01 – 2022-11-08 (×8): 10 mg via ORAL
  Filled 2022-11-01 (×8): qty 1

## 2022-11-01 MED ORDER — PROPOFOL 10 MG/ML IV BOLUS
INTRAVENOUS | Status: AC
  Filled 2022-11-01: qty 20

## 2022-11-01 MED ORDER — FENTANYL CITRATE (PF) 100 MCG/2ML IJ SOLN
INTRAMUSCULAR | Status: DC | PRN
  Administered 2022-11-01 (×4): 25 ug via INTRAVENOUS

## 2022-11-01 MED ORDER — FAMOTIDINE 20 MG PO TABS
ORAL_TABLET | ORAL | Status: AC
  Filled 2022-11-01: qty 1

## 2022-11-01 MED ORDER — VITAMIN C 500 MG PO TABS
1000.0000 mg | ORAL_TABLET | Freq: Every day | ORAL | Status: DC
  Administered 2022-11-01 – 2022-11-09 (×8): 1000 mg via ORAL
  Filled 2022-11-01 (×8): qty 2

## 2022-11-01 MED ORDER — GUAIFENESIN-DM 100-10 MG/5ML PO SYRP
15.0000 mL | ORAL_SOLUTION | ORAL | Status: DC | PRN
  Filled 2022-11-01: qty 20

## 2022-11-01 MED ORDER — FENTANYL CITRATE (PF) 100 MCG/2ML IJ SOLN
INTRAMUSCULAR | Status: AC
  Filled 2022-11-01: qty 2

## 2022-11-01 MED ORDER — CALCIUM CARBONATE 600 MG PO TABS: 600.0000 mg | ORAL_TABLET | Freq: Every day | ORAL | Status: DC

## 2022-11-01 MED ORDER — SODIUM CHLORIDE 0.9 % IV SOLN: 500.0000 mL | Freq: Once | INTRAVENOUS | Status: DC | PRN

## 2022-11-01 MED ORDER — OXYCODONE-ACETAMINOPHEN 5-325 MG PO TABS
1.0000 | ORAL_TABLET | ORAL | Status: DC | PRN
  Administered 2022-11-01: 1 via ORAL
  Administered 2022-11-01 (×2): 2 via ORAL
  Administered 2022-11-03 – 2022-11-05 (×4): 1 via ORAL
  Filled 2022-11-01: qty 1
  Filled 2022-11-01 (×2): qty 2
  Filled 2022-11-01 (×3): qty 1
  Filled 2022-11-01: qty 2

## 2022-11-01 MED ORDER — FENTANYL CITRATE (PF) 100 MCG/2ML IJ SOLN: 25.0000 ug | INTRAMUSCULAR | Status: DC | PRN

## 2022-11-01 MED ORDER — NITROGLYCERIN IN D5W 200-5 MCG/ML-% IV SOLN: 5.0000 ug/min | INTRAVENOUS | Status: DC

## 2022-11-01 MED ORDER — ONDANSETRON HCL 4 MG/2ML IJ SOLN
4.0000 mg | Freq: Four times a day (QID) | INTRAMUSCULAR | Status: DC | PRN
  Administered 2022-11-01 – 2022-11-02 (×2): 4 mg via INTRAVENOUS

## 2022-11-01 MED ORDER — HYDROMORPHONE HCL 1 MG/ML IJ SOLN
1.0000 mg | Freq: Once | INTRAMUSCULAR | Status: AC | PRN
  Administered 2022-11-01: 1 mg via INTRAVENOUS

## 2022-11-01 MED ORDER — DOCUSATE SODIUM 100 MG PO CAPS: 100.0000 mg | ORAL_CAPSULE | Freq: Every day | ORAL | Status: DC

## 2022-11-01 MED ORDER — DOPAMINE-DEXTROSE 3.2-5 MG/ML-% IV SOLN: 3.0000 ug/kg/min | INTRAVENOUS | Status: DC

## 2022-11-01 MED ORDER — PROPOFOL 10 MG/ML IV BOLUS
INTRAVENOUS | Status: DC | PRN
  Administered 2022-11-01: 120 mg via INTRAVENOUS

## 2022-11-01 MED ORDER — HYDRALAZINE HCL 20 MG/ML IJ SOLN
5.0000 mg | INTRAMUSCULAR | Status: DC | PRN
  Filled 2022-11-01: qty 1

## 2022-11-01 MED ORDER — ACETAMINOPHEN 325 MG RE SUPP: 325.0000 mg | RECTAL | Status: DC | PRN

## 2022-11-01 MED ORDER — POTASSIUM CHLORIDE CRYS ER 20 MEQ PO TBCR: 20.0000 meq | EXTENDED_RELEASE_TABLET | Freq: Every day | ORAL | Status: DC | PRN

## 2022-11-01 MED ORDER — FLUTICASONE PROPIONATE 50 MCG/ACT NA SUSP: 2.0000 | NASAL | Status: DC | PRN

## 2022-11-01 MED ORDER — CHLORHEXIDINE GLUCONATE CLOTH 2 % EX PADS
6.0000 | MEDICATED_PAD | Freq: Every day | CUTANEOUS | Status: DC
  Administered 2022-11-01 – 2022-11-09 (×6): 6 via TOPICAL

## 2022-11-01 MED ORDER — EZETIMIBE 10 MG PO TABS
10.0000 mg | ORAL_TABLET | Freq: Every day | ORAL | Status: DC
  Administered 2022-11-01 – 2022-11-09 (×8): 10 mg via ORAL
  Filled 2022-11-01 (×10): qty 1

## 2022-11-01 MED ORDER — GLYCOPYRROLATE 0.2 MG/ML IJ SOLN
INTRAMUSCULAR | Status: DC | PRN
  Administered 2022-11-01: .2 mg via INTRAVENOUS

## 2022-11-01 MED ORDER — ATORVASTATIN CALCIUM 10 MG PO TABS
10.0000 mg | ORAL_TABLET | Freq: Every day | ORAL | Status: DC
  Filled 2022-11-01: qty 1

## 2022-11-01 MED ORDER — PHENYLEPHRINE HCL-NACL 20-0.9 MG/250ML-% IV SOLN
INTRAVENOUS | Status: DC | PRN
  Administered 2022-11-01: 30 ug/min via INTRAVENOUS

## 2022-11-01 MED ORDER — SODIUM CHLORIDE 0.9 % IV SOLN: INTRAVENOUS | Status: DC | PRN

## 2022-11-01 MED ORDER — MORPHINE SULFATE (PF) 4 MG/ML IV SOLN
2.0000 mg | INTRAVENOUS | Status: DC | PRN
  Administered 2022-11-01 (×3): 2 mg via INTRAVENOUS
  Filled 2022-11-01 (×4): qty 1

## 2022-11-01 MED ORDER — ALUM & MAG HYDROXIDE-SIMETH 200-200-20 MG/5ML PO SUSP: 15.0000 mL | ORAL | Status: DC | PRN

## 2022-11-01 MED ORDER — FLUTICASONE FUROATE-VILANTEROL 100-25 MCG/ACT IN AEPB
1.0000 | INHALATION_SPRAY | RESPIRATORY_TRACT | Status: DC
  Administered 2022-11-03 – 2022-11-08 (×6): 1 via RESPIRATORY_TRACT
  Filled 2022-11-01 (×2): qty 28

## 2022-11-01 SURGICAL SUPPLY — 34 items
CATH ACCU-VU SIZ PIG 5F 70CM (CATHETERS) IMPLANT
CATH BALLN CODA 9X100X32 (BALLOONS) IMPLANT
CATH BEACON 5 .035 65 KMP TIP (CATHETERS) IMPLANT
CATH OMNI 2 5FR 80CM SOS (CATHETERS) IMPLANT
CLOSURE PERCLOSE PROSTYLE (VASCULAR PRODUCTS) IMPLANT
COVER DRAPE FLUORO 36X44 (DRAPES) IMPLANT
COVER PROBE ULTRASOUND 5X96 (MISCELLANEOUS) IMPLANT
DEVICE SAFEGUARD 24CM (GAUZE/BANDAGES/DRESSINGS) IMPLANT
DEVICE TORQUE .025-.038 (MISCELLANEOUS) IMPLANT
DRYSEAL FLEXSHEATH 12FR 33CM (SHEATH) ×1
DRYSEAL FLEXSHEATH 16FR 33CM (SHEATH) ×1
EXCLDR TRNK 28.5X14.5X12 16F (Endovascular Graft) ×1 IMPLANT
EXCLUDER TNK 28.5X14.5X12 16F (Endovascular Graft) IMPLANT
GLIDEWIRE STIFF .35X180X3 HYDR (WIRE) IMPLANT
KIT ENCORE 26 ADVANTAGE (KITS) IMPLANT
LEG CONTRALATERAL 16X12X10 (Vascular Products) ×1 IMPLANT
LEG CONTRALATERAL 16X14.5X12 (Vascular Products) IMPLANT
NDL ENTRY 21GA 7CM ECHOTIP (NEEDLE) IMPLANT
NEEDLE ENTRY 21GA 7CM ECHOTIP (NEEDLE) ×1 IMPLANT
PACK ANGIOGRAPHY (CUSTOM PROCEDURE TRAY) ×1 IMPLANT
SET INTRO CAPELLA COAXIAL (SET/KITS/TRAYS/PACK) IMPLANT
SHEATH BRITE TIP 6FRX11 (SHEATH) IMPLANT
SHEATH BRITE TIP 8FRX11 (SHEATH) IMPLANT
SHEATH DRYSEAL FLEX 12FR 33CM (SHEATH) IMPLANT
SHEATH DRYSEAL FLEX 16FR 33CM (SHEATH) IMPLANT
SHEATH FLEXOR ANSEL2 7FRX45 (SHEATH) IMPLANT
SPONGE XRAY 4X4 16PLY STRL (MISCELLANEOUS) IMPLANT
STENT GRAFT CONTRALAT 16X12X10 (Vascular Products) IMPLANT
STENT LIFESTREAM 6X26X80 (Permanent Stent) IMPLANT
SYR MEDRAD MARK 7 150ML (SYRINGE) IMPLANT
TUBING CONTRAST HIGH PRESS 72 (TUBING) IMPLANT
WIRE AMPLATZ SSTIFF .035X260CM (WIRE) IMPLANT
WIRE GUIDERIGHT .035X150 (WIRE) IMPLANT
WIRE SUPRACORE 300CM (MISCELLANEOUS) IMPLANT

## 2022-11-01 NOTE — Transfer of Care (Signed)
Immediate Anesthesia Transfer of Care Note  Patient: Autumn Le  Procedure(s) Performed: ENDOVASCULAR REPAIR/STENT GRAFT  Patient Location: PACU  Anesthesia Type:General  Level of Consciousness: awake, drowsy, and patient cooperative  Airway & Oxygen Therapy: Patient Spontanous Breathing and Patient connected to face mask oxygen  Post-op Assessment: Report given to RN and Post -op Vital signs reviewed and stable  Post vital signs: Reviewed and stable  Last Vitals:  Vitals Value Taken Time  BP 180/78 11/01/22 1027  Temp 36.3 C 11/01/22 1022  Pulse 76 11/01/22 1029  Resp 16 11/01/22 1029  SpO2 100 % 11/01/22 1029  Vitals shown include unvalidated device data.  Last Pain:  Vitals:   11/01/22 1022  PainSc: 0-No pain         Complications: No notable events documented.

## 2022-11-01 NOTE — Anesthesia Postprocedure Evaluation (Signed)
Anesthesia Post Note  Patient: Autumn Le  Procedure(s) Performed: ENDOVASCULAR REPAIR/STENT GRAFT  Patient location during evaluation: PACU Anesthesia Type: General Level of consciousness: awake Pain management: satisfactory to patient Vital Signs Assessment: post-procedure vital signs reviewed and stable Respiratory status: spontaneous breathing Cardiovascular status: stable Anesthetic complications: no  No notable events documented.   Last Vitals:  Vitals:   11/01/22 1045 11/01/22 1100  BP: (!) 150/82 135/68  Pulse: (!) 53 62  Resp: 10 11  Temp:  (!) 36.1 C  SpO2: 100% 96%    Last Pain:  Vitals:   11/01/22 1100  PainSc: 5                  VAN STAVEREN,Dexter Signor

## 2022-11-01 NOTE — Op Note (Signed)
OPERATIVE NOTE   PROCEDURE: US guidance for vascular access, bilateral femoral arteries Catheter placement into aorta from bilateral femoral approaches Placement of a 28 x 14 x 12 conformable Gore Excluder Endoprosthesis main body with a 14 x 12 ipsilateral extension with a 12 x 10 contralateral limb Selective right renal artery angiography first-order catheter placement with stent placement to the ostia of the right renal artery ProGlide closure devices bilateral femoral arteries  PRE-OPERATIVE DIAGNOSIS: AAA  POST-OPERATIVE DIAGNOSIS: same  SURGEON: Autumn Pilar, MD and Leotis Pain, MD - Co-surgeons  ANESTHESIA: general  ESTIMATED BLOOD LOSS: 25 cc  FINDING(S): 1.  AAA  SPECIMEN(S):  none  INDICATIONS:   Autumn Le is a 76 y.o. y.o. female who presents with large abdominal aortic aneurysm.  The aneurysm is greater than 5 cm and therefore she is at risk for lethal rupture.  She is an endovascular candidate and therefore is undergoing stent graft repair to prevent lethal rupture.  Risk and benefits of been reviewed all questions answered patient agrees to proceed.  DESCRIPTION: After obtaining full informed written consent, the patient was brought back to the operating room and placed supine upon the operating table.  The patient received IV antibiotics prior to induction.  After obtaining adequate anesthesia, the patient was prepped and draped in the standard fashion for endovascular AAA repair.  Co-surgeons are required because this is a complex bilateral procedure with work being performed simultaneously from both the right femoral and left femoral approach.  This also expedites the procedure making a shorter operative time reducing complications and improving patient safety.  We then began by gaining access to both femoral arteries with US guidance with me working on the patient's left and Dr. Lucky Cowboy working on the patient's right.  The femoral arteries were found to be  patent and accessed without difficulty with a needle under ultrasound guidance without difficulty on each side and permanent images were recorded.  We then placed 2 proglide devices on each side in a pre-close fashion and placed 8 French sheaths.  The patient was then given 5000 units of intravenous heparin.   At this point with magnified imaging of the renal arteries to better line up the main body of the stent graft we identified a greater than 90% stenosis at the origin of the right renal artery.  Working from the right side with Dr. Lucky Cowboy is the primary and myself as the assistant he used a V S1 catheter to select the right renal artery hand-injection of contrast was then used to demonstrate the renal artery distally was widely patent with a full nephrogram.  A supra core wire was then advanced followed by placement of a 7 [place] Ansell sheath through the large 16 [place] sheath.  This was then positioned with its tip at the wall of the aorta.  A 6 mm x 26 mm Lifestream stent was then selected and advanced across the 90% ostial stenosis imaging was performed by hand-injection through the 7 French sheath to verify positioning and the stent was deployed to 10 atm without difficulty.  Follow-up imaging demonstrated less than 10% residual stenosis with excellent stent position.  The Pigtail catheter was placed into the aorta from the left side. Using this image, we selected a C3 28 x 14 x 12 Main body device.  Over a stiff wire, an 49 French sheath was placed. The main body was then placed through the 16 French sheath. A Kumpe catheter was placed up the left  side and a magnified image at the renal arteries was performed. The main body was then deployed just below the lowest renal artery. The Kumpe catheter was used to cannulate the contralateral gate without difficulty and successful cannulation was confirmed by twirling the pigtail catheter in the main body. We then placed a stiff wire and a retrograde  arteriogram was performed through the left femoral sheath. We upsized to the 12 [place] sheath for the contralateral limb and a 12 x 10 limb was selected and deployed. The main body deployment was then completed. Based off the angiographic findings, extension limbs were necessary on the ipsilateral side.  A 14 x 12 ipsilateral extension limb was then deployed on the right. All junction points and seals zones were treated with the compliant balloon.   The pigtail catheter was then replaced and a completion angiogram was performed.   Very faint type II endoleak was detected on completion angiography. The renal arteries were found to be widely patent.    At this point we elected to terminate the procedure. We secured the pro glide devices for hemostasis on the femoral arteries. The skin incision was closed with a 4-0 Monocryl. Dermabond and pressure dressing were placed. The patient was taken to the recovery room in stable condition having tolerated the procedure well.  COMPLICATIONS: none  CONDITION: stable  Autumn Le  11/01/2022, 10:11 AM

## 2022-11-01 NOTE — Op Note (Signed)
OPERATIVE NOTE   PROCEDURE: US guidance for vascular access, bilateral femoral arteries Catheter placement into aorta from bilateral femoral approaches into the right renal artery from right femoral approach Right renal angiogram Placement of a stent in the right renal artery with a 6 mm diameter by 26 mm length Lifestream stent Placement of a 28 mm diameter proximal 12 cm length Gore Excluder Endoprosthesis main body right with a 12 mm diameter by 10 cm length left iliac contralateral limb Placement of a 14 mm diameter by 12 cm length right iliac extension limb ProGlide closure devices bilateral femoral arteries  PRE-OPERATIVE DIAGNOSIS: AAA  POST-OPERATIVE DIAGNOSIS: same  SURGEON: Leotis Pain, MD and Hortencia Pilar, MD - Co-surgeons  ANESTHESIA: General  ESTIMATED BLOOD LOSS: 25 cc  FINDING(S): 1.  AAA  SPECIMEN(S):  none  INDICATIONS:   Autumn Le is a 76 y.o. female who presents with a greater than 5 cm abdominal aortic aneurysm and a tiny woman which is also had significant growth and she has abdominal pain with no other obvious cause requiring repair. The anatomy was suitable for endovascular repair.  Risks and benefits of repair in an endovascular fashion were discussed and informed consent was obtained. Co-surgeons are used to expedite the procedure and reduce operative time as bilateral work needs to be done.  DESCRIPTION: After obtaining full informed written consent, the patient was brought back to the operating room and placed supine upon the operating table.  The patient received IV antibiotics prior to induction.  After obtaining adequate anesthesia, the patient was prepped and draped in the standard fashion for endovascular AAA repair.  We then began by gaining access to both femoral arteries with US guidance with me working on the right and Dr. Delana Meyer working on the left.  The femoral arteries were found to be patent and accessed without difficulty with a  needle under ultrasound guidance without difficulty on each side and permanent images were recorded.  We then placed 2 proglide devices on each side in a pre-close fashion and placed 8 French sheaths. The patient was then given 5000 units of intravenous heparin. The Pigtail catheter was placed into the aorta from the left side.  With an RAO and cranial projection we were able to see the renal arteries and there was a relatively high-grade stenosis of the right renal artery which was the lowest renal artery.  I then used a V S1 catheter from the right side selectively cannulated the right renal artery and perform selective imaging which demonstrated a greater than 75% stenosis of the right renal artery.  I elected to go ahead and stent this and then parked the aneurysm stent graft just below the right renal artery stent. Over a stiff wire, an 16 French sheath was placed on the right side.  After exchanging for a supra core wire, a 7 [place] Ansell sheath was placed through the large 79 French sheath up that was on the right side and into the proximal right renal artery.  I then selected a 6 mm diameter by 26 mm length Lifestream stent and deployed this in the right renal artery extending out into the aorta about 2 to 3 mm.  A tight waist was seen which resolved at about 10 atm and there was less than 10% residual stenosis after stent placement.  Completion imaging was then performed in the aorta through the pigtail catheter to help localize and place the main body device.  Using this image, we selected  a 28 mm diameter by 12 cm length Gore excluder Main body device.  The main body was then placed through the 16 French sheath. A Kumpe catheter was placed up the left side and a magnified image at the renal arteries was performed. The main body was then deployed just below the stent in the right renal artery. The Kumpe catheter was used to cannulate the contralateral gate without difficulty and successful cannulation was  confirmed by twirling the pigtail catheter in the main body. We then placed a stiff wire and a retrograde arteriogram was performed through the left femoral sheath. We upsized to the 12 [place] sheath for the contralateral limb and a 12 mm diameter by 10 cm length left iliac limb was selected and deployed. The main body deployment was then completed. Based off the angiographic findings, extension limbs were necessary.  A 14 mm diameter by 12 cm length right iliac extension limb was then taken down to just above the right hypogastric artery.. All junction points and seals zones were treated with the compliant balloon. The pigtail catheter was then replaced and a completion angiogram was performed.  No endoleak was detected on completion angiography. The renal arteries were found to be widely patent. At this point we elected to terminate the procedure. We secured the pro glide devices for hemostasis on the femoral arteries. The skin incision was closed with a 4-0 Monocryl. Dermabond and pressure dressing were placed. The patient was taken to the recovery room in stable condition having tolerated the procedure well.  COMPLICATIONS: none  CONDITION: stable  Leotis Pain  11/01/2022, 10:09 AM   This note was created with Dragon Medical transcription system. Any errors in dictation are purely unintentional.

## 2022-11-01 NOTE — Progress Notes (Addendum)
Dr. Wyn Quaker notified that PT and DP pulses are absent on left lower extremity. Left popliteal pulse doppler. Foot is paler and cooler than right lower extremity. Dr. Wyn Quaker to bedside. Verbal order to deflate left PAD and keep deflated.

## 2022-11-01 NOTE — Anesthesia Procedure Notes (Signed)
Procedure Name: Intubation Date/Time: 11/01/2022 8:26 AM  Performed by: Mohammed Kindle, CRNAPre-anesthesia Checklist: Patient identified, Emergency Drugs available, Suction available and Patient being monitored Patient Re-evaluated:Patient Re-evaluated prior to induction Oxygen Delivery Method: Circle system utilized Preoxygenation: Pre-oxygenation with 100% oxygen Induction Type: IV induction Ventilation: Mask ventilation without difficulty Laryngoscope Size: McGraph and 3 Grade View: Grade I Tube type: Oral Tube size: 6.5 mm Number of attempts: 1 Airway Equipment and Method: Stylet and Oral airway Placement Confirmation: ETT inserted through vocal cords under direct vision, positive ETCO2, breath sounds checked- equal and bilateral and CO2 detector Secured at: 21 cm Tube secured with: Tape Dental Injury: Teeth and Oropharynx as per pre-operative assessment

## 2022-11-01 NOTE — Anesthesia Preprocedure Evaluation (Signed)
Anesthesia Evaluation  Patient identified by MRN, date of birth, ID band Patient awake    Reviewed: Allergy & Precautions, NPO status , Patient's Chart, lab work & pertinent test results  Airway Mallampati: II  TM Distance: <3 FB Neck ROM: Full    Dental  (+) Partial Upper   Pulmonary neg pulmonary ROS, COPD, Current Smoker and Patient abstained from smoking.   Pulmonary exam normal  + decreased breath sounds      Cardiovascular Exercise Tolerance: Good hypertension, + CAD  negative cardio ROS Normal cardiovascular exam Rhythm:Regular     Neuro/Psych S/p repair / clipping cerebral aneurysm negative neurological ROS  negative psych ROS   GI/Hepatic negative GI ROS, Neg liver ROS,GERD  Controlled,,  Endo/Other  negative endocrine ROS    Renal/GU negative Renal ROS     Musculoskeletal  (+) Arthritis ,    Abdominal Normal abdominal exam  (+)   Peds negative pediatric ROS (+)  Hematology negative hematology ROS (+)   Anesthesia Other Findings Past Medical History: No date: AAA (abdominal aortic aneurysm) (HCC)     Comment:  a.) aortic US 02/05/2020: infrarenal AAA measuring 4.2 x              4.6 cm; b.) AAA duplex 08/12/2020: measured 4.3 cm; c.)               AAA duplex 02/23/2021: measured 4.53 x 4.31 cm; d.) AAA               doppler 03/07/2022: measured 4.53 x 4.31 cm; e.) LD chest              CT 06/14/2022: measured 4.6 cm; f.) CT abd: partially               thrombosed measuring up to 5.0 cm (overread by vascular               surgery suggested size actually larger at 5.2-5.3 cm) No date: Anemia 01/23/2020: 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     Comment:  a.) IR angio 01/23/2020: 8.0 x 5.6 mm wide necked               aneurysm arising from the horizontal segment of the               cavernous LEFT ICA 01/23/2020: Anterior communicating artery aneurysm     Comment:  a.) IR  angio 01/23/2020: 3.9 x 4.6 mm aneurysm at the               bifurcation of the A2/A3 segment into the pericallosal               and callosal marginal arteries; b.) s/p coli embolization              06/04/2020 No date: Aortic atherosclerosis (HCC) No date: CAD (coronary artery disease) No date: Compression fracture of L1 lumbar vertebra (HCC) No date: COPD (chronic obstructive pulmonary disease) (HCC) No date: DDD (degenerative disc disease), cervical No date: Degenerative joint disease of cervical spine No date: Diverticulosis No date: Emphysema lung (HCC) No date: Eustachian tube dysfunction No date: GERD (gastroesophageal reflux disease) No date: HLD (hyperlipidemia) No date: HTN (hypertension) No date: Hypokalemia No date: Insomnia No date: Long term current use of antithrombotics/antiplatelets     Comment:  a.) clopidogrel No date: Lung nodule No date: Meniere's disease in remission, bilateral No date: Osteopenia No date: PAF (paroxysmal atrial fibrillation) (HCC)  01/23/2020: 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)     Comment:  a.) IR angio 01/23/2020: 6.2 x 6.1 x 8.9 ruptured               aneurysm arising from the origion of the RIGHT PCA               projecting laterally --> coil embolization performed               01/23/2020 01/23/2020: 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 (HCC)     Comment:  a.) CT head 01/23/2020: extensive SAH about the RIGHT               cerebral hemisphere and basal cisterns secondary to RIGHT              PCA aneurysm rupture No date: Tobacco abuse No date: Unintentional weight loss     Comment:  a.) reports eating well; eats 4-5 x/day and still feels               hungry; b.) weight decreasing: 104 lb (02/2021), 88 lb               (02/2022), 86 lb (08/2022), 83 lb (10/2022) No date: Vertigo  Past Surgical History: 2021: BRAIN SURGERY No date: CATARACT EXTRACTION W/  INTRAOCULAR LENS  IMPLANT, BILATERAL No date: EYE SURGERY 01/23/2020: IR ANGIO INTRA EXTRACRAN SEL INTERNAL CAROTID BILAT MOD  SED 06/04/2020: IR ANGIO INTRA EXTRACRAN SEL INTERNAL CAROTID UNI R MOD  SED 01/23/2020: IR ANGIO VERTEBRAL SEL VERTEBRAL UNI L MOD SED 01/23/2020: IR ANGIOGRAM FOLLOW UP STUDY 01/23/2020: IR ANGIOGRAM FOLLOW UP STUDY 01/23/2020: IR ANGIOGRAM FOLLOW UP STUDY 01/23/2020: IR ANGIOGRAM FOLLOW UP STUDY 06/04/2020: IR ANGIOGRAM FOLLOW UP STUDY 06/04/2020: IR ANGIOGRAM FOLLOW UP STUDY 06/04/2020: IR NEURO EACH ADD'L AFTER BASIC UNI RIGHT (MS) 01/23/2020: IR TRANSCATH/EMBOLIZ 06/04/2020: IR TRANSCATH/EMBOLIZ 06/04/2020: IR US GUIDE VASC ACCESS RIGHT 06/23/2021: MYRINGOTOMY WITH TUBE PLACEMENT; Left     Comment:  Procedure: MYRINGOTOMY WITH TUBE PLACEMENT;  Surgeon:               Vernie Murders, MD;  Location[facility];                Service: ENT;  Laterality: Left; 01/23/2020: RADIOLOGY WITH ANESTHESIA; N/A     Comment:  Procedure: RADIOLOGY WITH ANESTHESIA;  Surgeon:               Lisbeth Renshaw, MD;  Location: [facility] OR;  Service:               Radiology;  Laterality: N/A; 06/04/2020: RADIOLOGY WITH ANESTHESIA; N/A     Comment:  Procedure: Coil embolization of left ACA aneurysm;                Surgeon: Lisbeth Renshaw, MD;  Location: [facility] OR;                Service: Radiology;  Laterality: N/A; No date: TONSILLECTOMY  BMI    Body Mass Index: 15.68 kg/m      Reproductive/Obstetrics negative OB ROS                             Anesthesia Physical Anesthesia Plan  ASA: 3  Anesthesia Plan: General   Post-op Pain Management:    Induction: Intravenous  PONV Risk Score and Plan: Ondansetron, Dexamethasone, Midazolam and Treatment may  vary due to age or medical condition  Airway Management Planned: Oral ETT  Additional Equipment:   Intra-op Plan:   Post-operative Plan:   Informed Consent: I have reviewed the  patients History and Physical, chart, labs and discussed the procedure including the risks, benefits and alternatives for the proposed anesthesia with the patient or authorized representative who has indicated his/her understanding and acceptance.     Dental Advisory Given  Plan Discussed with: CRNA and Surgeon  Anesthesia Plan Comments:        Anesthesia Quick Evaluation

## 2022-11-01 NOTE — Progress Notes (Signed)
Vascular NP to bedside approximately an hour after Left PAD deflated. Left popliteal, PT, and DP pulses absent. Left femoral pulse doppler. Extremity pale and cool. Patient complaining of constant 8 out of 10 pain despite PRN medications.   1700- Dr. Gilda Crease to bedside to assess patient. Spoke with patient about plan of care for initiation of heparin gtt and plan for follow up angiogram in AM.

## 2022-11-01 NOTE — Interval H&P Note (Signed)
History and Physical Interval Note:  11/01/2022 8:15 AM  Autumn Le  has presented today for surgery, with the diagnosis of Endovascular Stent   GORE   AAA.  The various methods of treatment have been discussed with the patient and family. After consideration of risks, benefits and other options for treatment, the patient has consented to  Procedure(s): ENDOVASCULAR REPAIR/STENT GRAFT (N/A) as a surgical intervention.  The patient's history has been reviewed, patient examined, no change in status, stable for surgery.  I have reviewed the patient's chart and labs.  Questions were answered to the patient's satisfaction.     [name]

## 2022-11-01 NOTE — Consult Note (Signed)
ANTICOAGULATION CONSULT NOTE - Initial Consult  Pharmacy Consult for Heparin Infusion Indication:  arterial occlusion  Allergies  Allergen Reactions   Latex Hives    bandaids   Nickel Other (See Comments)    blisters    Patient Measurements: Height: 5\' 1"  (154.9 cm) Weight: 37.4 kg (82 lb 7.2 oz) IBW/kg (Calculated) : 47.8 Heparin Dosing Weight: 37.4 kg  Vital Signs: Temp: 97.7 F (36.5 C) (12/20 1647) Temp Source: Oral (12/20 1647) BP: 167/69 (12/20 1730) Pulse Rate: 57 (12/20 1730)  Labs: Recent Labs    10/30/22 1152 11/01/22 1110  HGB 13.6 10.7*  HCT 40.6 32.9*  PLT 225 194  APTT  --  147*  LABPROT  --  14.9  INR  --  1.2  CREATININE 0.59 0.45    Estimated Creatinine Clearance: 35.9 mL/min (by C-G formula based on SCr of 0.45 mg/dL).   Medical History: Past Medical History:  Diagnosis Date   AAA (abdominal aortic aneurysm) (HCC)    a.) aortic 11/03/22 02/05/2020: infrarenal AAA measuring 4.2 x 4.6 cm; b.) AAA duplex 08/12/2020: measured 4.3 cm; c.) AAA duplex 02/23/2021: measured 4.53 x 4.31 cm; d.) AAA doppler 03/07/2022: measured 4.53 x 4.31 cm; e.) LD chest CT 06/14/2022: measured 4.6 cm; f.) CT abd: partially thrombosed measuring up to 5.0 cm (overread by vascular surgery suggested size actually larger at 5.2-5.3 cm)   Anemia    Aneurysm of cavernous portion of left internal carotid artery 01/23/2020   a.) IR angio 01/23/2020: 8.0 x 5.6 mm wide necked aneurysm arising from the horizontal segment of the cavernous LEFT ICA   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   a.) IR angio 01/23/2020: 3.9 x 4.6 mm aneurysm at the bifurcation of the A2/A3 segment into the pericallosal and callosal marginal arteries; b.) s/p coli embolization 06/04/2020   Aortic atherosclerosis (HCC)    CAD (coronary artery disease)    Compression fracture of L1 lumbar vertebra (HCC)    COPD (chronic obstructive pulmonary disease) (HCC)    DDD (degenerative disc disease), cervical     Degenerative joint disease of cervical spine    Diverticulosis    Emphysema lung (HCC)    Eustachian tube dysfunction    GERD (gastroesophageal reflux disease)    HLD (hyperlipidemia)    HTN (hypertension)    Hypokalemia    Insomnia    Long term current use of antithrombotics/antiplatelets    a.) clopidogrel   Lung nodule    Meniere's disease in remission, bilateral    Osteopenia    PAF (paroxysmal atrial fibrillation) (HCC)    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   a.) IR angio 01/23/2020: 6.2 x 6.1 x 8.9 ruptured aneurysm arising from the origion of the RIGHT PCA projecting laterally --> coil embolization performed 01/23/2020   Subarachnoid 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 (HCC) 01/23/2020   a.) CT head 01/23/2020: extensive SAH about the RIGHT cerebral hemisphere and basal cisterns secondary to RIGHT PCA aneurysm rupture   Tobacco abuse    Unintentional weight loss    a.) reports eating well; eats 4-5 x/day and still feels hungry; b.) weight decreasing: 104 lb (02/2021), 88 lb (02/2022), 86 lb (08/2022), 83 lb (10/2022)   Vertigo     Medications:  Scheduled:   vitamin C  1,000 mg Oral Daily   [START ON 11/02/2022] aspirin EC  81 mg Oral Q0600   calcium carbonate  1 tablet Oral QHS   Chlorhexidine Gluconate Cloth  6 each Topical Daily   cholecalciferol  2,000 Units Oral QHS   clopidogrel  75 mg Oral Daily   docusate sodium  100 mg Oral BID   ezetimibe  10 mg Oral Daily   [START ON 11/02/2022] fluticasone furoate-vilanterol  1 puff Inhalation [id number]   labetalol       loratadine  10 mg Oral QHS   [START ON 11/02/2022] multivitamin with minerals   Oral Q0600   rosuvastatin  5 mg Oral Daily   Infusions:   sodium chloride Stopped (11/01/22 1640)   sodium chloride      ceFAZolin (ANCEF) IV 200 mL/hr at 11/01/22 1652   DOPamine     famotidine (PEPCID) IV     magnesium sulfate bolus IVPB      nitroGLYCERIN     PRN: sodium chloride, acetaminophen **OR** acetaminophen, alum & mag hydroxide-simeth, fluticasone, guaiFENesin-dextromethorphan, hydrALAZINE, labetalol, labetalol, magnesium sulfate bolus IVPB, metoprolol tartrate, morphine injection, ondansetron (ZOFRAN) IV, ondansetron, mouth rinse, oxyCODONE-acetaminophen, phenol, potassium chloride  Assessment: Autumn Le is a 76 y.o. female presenting for AAA repair. PMH significant for AAA, COPD, GERD, HLD, HTN, hx SAH. Patient was not on [facility] PTA per chart review. Patient underwent AAA repair 12/20 with vascular surgery. Patient found to have diminished pulses in LLE. Plan for angiogram 12/21. Baseline aPTT likely elevated due to heparin 5000 units given in OR 12/20 @ 0856. Pharmacy has been consulted to initiate and manage heparin infusion.   Baseline Labs (12/20 @ 1110): aPTT 147, PT 14.9, INR 1.2, Hgb 10.7, Hct 32.9, Plt 194   Goal of Therapy:  Heparin level 0.3-0.7 units/ml Monitor platelets by anticoagulation protocol: Yes   Plan:  No bolus per vascular  Start heparin infusion at 500 units/hr Check HL in 8 hours  Continue to monitor H&H and platelets daily while on heparin infusion   Celene Squibb, PharmD PGY1 Pharmacy Resident 11/01/2022 7:49 PM

## 2022-11-02 ENCOUNTER — Encounter
Admission: RE | Disposition: A | Discharge status: Skilled Nursing Facility | Payer: Self-pay | Source: Home / Self Care | Attending: Vascular Surgery

## 2022-11-02 ENCOUNTER — Encounter: Payer: Self-pay | Admitting: Vascular Surgery

## 2022-11-02 DIAGNOSIS — I70222 Atherosclerosis of native arteries of extremities with rest pain, left leg: Secondary | ICD-10-CM | POA: Diagnosis not present

## 2022-11-02 DIAGNOSIS — Z9889 Other specified postprocedural states: Secondary | ICD-10-CM

## 2022-11-02 DIAGNOSIS — Z95828 Presence of other vascular implants and grafts: Secondary | ICD-10-CM | POA: Diagnosis not present

## 2022-11-02 HISTORY — PX: LOWER EXTREMITY ANGIOGRAPHY: CATH118251

## 2022-11-02 LAB — CBC
HCT: 30.7 % — ABNORMAL LOW (ref 36.0–46.0)
Hemoglobin: 10.2 g/dL — ABNORMAL LOW (ref 12.0–15.0)
MCH: 32.9 pg (ref 26.0–34.0)
MCHC: 33.2 g/dL (ref 30.0–36.0)
MCV: 99 fL (ref 80.0–100.0)
Platelets: 182 10*3/uL (ref 150–400)
RBC: 3.1 MIL/uL — ABNORMAL LOW (ref 3.87–5.11)
RDW: 12.9 % (ref 11.5–15.5)
WBC: 10.4 10*3/uL (ref 4.0–10.5)
nRBC: 0 % (ref 0.0–0.2)

## 2022-11-02 LAB — BASIC METABOLIC PANEL
Anion gap: 6 (ref 5–15)
BUN: 9 mg/dL (ref 8–23)
CO2: 24 mmol/L (ref 22–32)
Calcium: 8.2 mg/dL — ABNORMAL LOW (ref 8.9–10.3)
Chloride: 110 mmol/L (ref 98–111)
Creatinine, Ser: 0.54 mg/dL (ref 0.44–1.00)
GFR, Estimated: >60 mL/min (ref >60)
Glucose, Bld: 119 mg/dL — ABNORMAL HIGH (ref 70–99)
Potassium: 3.8 mmol/L (ref 3.5–5.1)
Sodium: 140 mmol/L (ref 135–145)

## 2022-11-02 LAB — HEPARIN LEVEL (UNFRACTIONATED)
Heparin Unfractionated: 0.14 IU/mL — ABNORMAL LOW (ref 0.30–0.70)
Heparin Unfractionated: 0.14 IU/mL — ABNORMAL LOW (ref 0.30–0.70)

## 2022-11-02 SURGERY — LOWER EXTREMITY ANGIOGRAPHY
Anesthesia: Moderate Sedation | Laterality: Left

## 2022-11-02 MED ORDER — HEPARIN BOLUS VIA INFUSION
1100.0000 [IU] | Freq: Once | INTRAVENOUS | Status: AC
  Administered 2022-11-02: 1100 [IU] via INTRAVENOUS
  Filled 2022-11-02: qty 1100

## 2022-11-02 MED ORDER — CEFAZOLIN SODIUM-DEXTROSE 2-4 GM/100ML-% IV SOLN
2.0000 g | INTRAVENOUS | Status: AC
  Administered 2022-11-02: 2 g via INTRAVENOUS
  Filled 2022-11-02: qty 100

## 2022-11-02 MED ORDER — FENTANYL CITRATE PF 50 MCG/ML IJ SOSY
PREFILLED_SYRINGE | INTRAMUSCULAR | Status: AC
  Filled 2022-11-02: qty 1

## 2022-11-02 MED ORDER — SODIUM CHLORIDE 0.9% FLUSH: 3.0000 mL | INTRAVENOUS | Status: DC | PRN

## 2022-11-02 MED ORDER — DIPHENHYDRAMINE HCL 50 MG/ML IJ SOLN: 50.0000 mg | Freq: Once | INTRAMUSCULAR | Status: DC | PRN

## 2022-11-02 MED ORDER — CEFAZOLIN SODIUM-DEXTROSE 2-4 GM/100ML-% IV SOLN
INTRAVENOUS | Status: AC
  Filled 2022-11-02: qty 100

## 2022-11-02 MED ORDER — NITROGLYCERIN 1 MG/10 ML FOR IR/CATH LAB
INTRA_ARTERIAL | Status: DC | PRN
  Administered 2022-11-02: 400 ug via INTRA_ARTERIAL
  Administered 2022-11-02: 300 ug via INTRA_ARTERIAL

## 2022-11-02 MED ORDER — ONDANSETRON HCL 4 MG/2ML IJ SOLN
4.0000 mg | Freq: Four times a day (QID) | INTRAMUSCULAR | Status: DC | PRN
  Administered 2022-11-03: 4 mg via INTRAVENOUS

## 2022-11-02 MED ORDER — SODIUM CHLORIDE 0.9% FLUSH
3.0000 mL | Freq: Two times a day (BID) | INTRAVENOUS | Status: DC
  Administered 2022-11-02 – 2022-11-09 (×14): 3 mL via INTRAVENOUS

## 2022-11-02 MED ORDER — MIDAZOLAM HCL 2 MG/2ML IJ SOLN
INTRAMUSCULAR | Status: DC | PRN
  Administered 2022-11-02 (×2): 1 mg via INTRAVENOUS

## 2022-11-02 MED ORDER — HYDROMORPHONE HCL 1 MG/ML IJ SOLN: 1.0000 mg | Freq: Once | INTRAMUSCULAR | Status: DC | PRN

## 2022-11-02 MED ORDER — HEPARIN (PORCINE) 25000 UT/250ML-% IV SOLN
800.0000 [IU]/h | INTRAVENOUS | Status: DC
  Administered 2022-11-03: 800 [IU]/h via INTRAVENOUS
  Filled 2022-11-02: qty 250

## 2022-11-02 MED ORDER — MIDAZOLAM HCL 2 MG/ML PO SYRP: 8.0000 mg | ORAL_SOLUTION | Freq: Once | ORAL | Status: DC | PRN

## 2022-11-02 MED ORDER — HEPARIN SODIUM (PORCINE) 1000 UNIT/ML IJ SOLN
INTRAMUSCULAR | Status: AC
  Filled 2022-11-02: qty 10

## 2022-11-02 MED ORDER — SODIUM CHLORIDE 0.9 % IV SOLN: INTRAVENOUS | Status: DC

## 2022-11-02 MED ORDER — SODIUM CHLORIDE 0.9 % IV SOLN
250.0000 mL | INTRAVENOUS | Status: DC | PRN
  Administered 2022-11-03: 250 mL via INTRAVENOUS

## 2022-11-02 MED ORDER — FENTANYL CITRATE (PF) 100 MCG/2ML IJ SOLN
INTRAMUSCULAR | Status: DC | PRN
  Administered 2022-11-02: 25 ug via INTRAVENOUS
  Administered 2022-11-02: 50 ug via INTRAVENOUS

## 2022-11-02 MED ORDER — METHYLPREDNISOLONE SODIUM SUCC 125 MG IJ SOLR: 125.0000 mg | Freq: Once | INTRAMUSCULAR | Status: DC | PRN

## 2022-11-02 MED ORDER — MIDAZOLAM HCL 2 MG/2ML IJ SOLN
INTRAMUSCULAR | Status: AC
  Filled 2022-11-02: qty 2

## 2022-11-02 MED ORDER — NITROGLYCERIN 1 MG/10 ML FOR IR/CATH LAB
INTRA_ARTERIAL | Status: AC
  Filled 2022-11-02: qty 10

## 2022-11-02 MED ORDER — IODIXANOL 320 MG/ML IV SOLN
INTRAVENOUS | Status: DC | PRN
  Administered 2022-11-02: 40 mL

## 2022-11-02 MED ORDER — SODIUM CHLORIDE 0.9 % IV SOLN: INTRAVENOUS | Status: AC

## 2022-11-02 MED ORDER — FAMOTIDINE 20 MG PO TABS: 40.0000 mg | ORAL_TABLET | Freq: Once | ORAL | Status: DC | PRN

## 2022-11-02 SURGICAL SUPPLY — 19 items
BALLN LUTONIX 018 5X100X130 (BALLOONS) ×1
BALLN LUTONIX 018 6X60X130 (BALLOONS) ×1
BALLOON LUTONIX 018 5X100X130 (BALLOONS) IMPLANT
BALLOON LUTONIX 018 6X60X130 (BALLOONS) IMPLANT
CANNULA 5F STIFF (CANNULA) IMPLANT
CATH ANGIO 5F PIGTAIL 100CM (CATHETERS) IMPLANT
CATH NAVICROSS ANGLED 135CM (MICROCATHETER) IMPLANT
CATH VERT 5FR 125CM (CATHETERS) IMPLANT
COVER PROBE ULTRASOUND 5X96 (MISCELLANEOUS) IMPLANT
DEVICE RAD COMP TR BAND LRG (VASCULAR PRODUCTS) IMPLANT
DRAPE BRACHIAL (DRAPES) IMPLANT
GLIDEWIRE ADV .035X260CM (WIRE) IMPLANT
GUIDEWIRE PFTE-COATED .018X300 (WIRE) IMPLANT
KIT ENCORE 26 ADVANTAGE (KITS) IMPLANT
PACK ANGIOGRAPHY (CUSTOM PROCEDURE TRAY) ×1 IMPLANT
SHEATH HALO 035 5FRX10 (SHEATH) IMPLANT
SYR MEDRAD MARK 7 150ML (SYRINGE) IMPLANT
TUBING CONTRAST HIGH PRESS 72 (TUBING) IMPLANT
WIRE GUIDERIGHT .035X150 (WIRE) IMPLANT

## 2022-11-02 NOTE — Consult Note (Signed)
ANTICOAGULATION CONSULT NOTE - Initial Consult  Pharmacy Consult for Heparin Infusion Indication:  arterial occlusion  Allergies  Allergen Reactions   Latex Hives    bandaids   Nickel Other (See Comments)    blisters    Patient Measurements: Height: 5\' 1"  (154.9 cm) Weight: 37.4 kg (82 lb 7.2 oz) IBW/kg (Calculated) : 47.8 Heparin Dosing Weight: 37.4 kg  Vital Signs: Temp: 99.6 F (37.6 C) (12/21 1752) Temp Source: Oral (12/21 1752) BP: 103/48 (12/21 1800) Pulse Rate: 85 (12/21 1800)  Labs: Recent Labs    11/01/22 1110 11/02/22 0230 11/02/22 1813  HGB 10.7* 10.2*  --   HCT 32.9* 30.7*  --   PLT 194 182  --   APTT 147*  --   --   LABPROT 14.9  --   --   INR 1.2  --   --   HEPARINUNFRC  --  0.14* 0.14*  CREATININE 0.45 0.54  --      Estimated Creatinine Clearance: 35.9 mL/min (by C-G formula based on SCr of 0.54 mg/dL).   Medical History: Past Medical History:  Diagnosis Date   AAA (abdominal aortic aneurysm) (Impact)    a.) aortic US 02/05/2020: infrarenal AAA measuring 4.2 x 4.6 cm; b.) AAA duplex 08/12/2020: measured 4.3 cm; c.) AAA duplex 02/23/2021: measured 4.53 x 4.31 cm; d.) AAA doppler 03/07/2022: measured 4.53 x 4.31 cm; e.) LD chest CT 06/14/2022: measured 4.6 cm; f.) CT abd: partially thrombosed measuring up to 5.0 cm (overread by vascular surgery suggested size actually larger at 5.2-5.3 cm)   Anemia    Aneurysm of cavernous portion of left internal carotid artery 01/23/2020   a.) IR angio 01/23/2020: 8.0 x 5.6 mm wide necked aneurysm arising from the horizontal segment of the cavernous LEFT ICA   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   a.) IR angio 01/23/2020: 3.9 x 4.6 mm aneurysm at the bifurcation of the A2/A3 segment into the pericallosal and callosal marginal arteries; b.) s/p coli embolization 06/04/2020   Aortic atherosclerosis (HCC)    CAD (coronary artery disease)    Compression fracture of L1 lumbar vertebra (HCC)    COPD  (chronic obstructive pulmonary disease) (HCC)    DDD (degenerative disc disease), cervical    Degenerative joint disease of cervical spine    Diverticulosis    Emphysema lung (HCC)    Eustachian tube dysfunction    GERD (gastroesophageal reflux disease)    HLD (hyperlipidemia)    HTN (hypertension)    Hypokalemia    Insomnia    Long term current use of antithrombotics/antiplatelets    a.) clopidogrel   Lung nodule    Meniere's disease in remission, bilateral    Osteopenia    PAF (paroxysmal atrial fibrillation) (HCC)    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 ([place]) 01/23/2020   a.) IR angio 01/23/2020: 6.2 x 6.1 x 8.9 ruptured aneurysm arising from the origion of the RIGHT PCA projecting laterally --> coil embolization performed 01/23/2020   Subarachnoid 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 ([place]) 01/23/2020   a.) CT head 01/23/2020: extensive SAH about the RIGHT cerebral hemisphere and basal cisterns secondary to RIGHT PCA aneurysm rupture   Tobacco abuse    Unintentional weight loss    a.) reports eating well; eats 4-5 x/day and still feels hungry; b.) weight decreasing: 104 lb (02/2021), 88 lb (02/2022), 86 lb (08/2022), 83 lb (10/2022)   Vertigo     Medications:  Scheduled:   vitamin C  1,000 mg Oral Daily   aspirin EC  81 mg Oral Q0600   calcium carbonate  1 tablet Oral QHS   Chlorhexidine Gluconate Cloth  6 each Topical Daily   cholecalciferol  2,000 Units Oral QHS   clopidogrel  75 mg Oral Daily   docusate sodium  100 mg Oral BID   ezetimibe  10 mg Oral Daily   fluticasone furoate-vilanterol  1 puff Inhalation BH-q7a   loratadine  10 mg Oral QHS   multivitamin with minerals   Oral Q0600   rosuvastatin  5 mg Oral Daily   sodium chloride flush  3 mL Intravenous Q12H   Infusions:   sodium chloride Stopped (11/02/22 1000)   sodium chloride     sodium chloride     DOPamine     famotidine (PEPCID) IV Stopped (11/02/22 1627)    heparin 650 Units/hr (11/02/22 1831)   magnesium sulfate bolus IVPB     nitroGLYCERIN     PRN: sodium chloride, sodium chloride, acetaminophen **OR** acetaminophen, alum & mag hydroxide-simeth, fluticasone, guaiFENesin-dextromethorphan, hydrALAZINE, HYDROmorphone (DILAUDID) injection, labetalol, magnesium sulfate bolus IVPB, metoprolol tartrate, morphine injection, ondansetron (ZOFRAN) IV, mouth rinse, oxyCODONE-acetaminophen, phenol, potassium chloride, sodium chloride flush  Baseline Labs (12/20 @ 1110): aPTT 147, PT 14.9, INR 1.2, Hgb 10.7, Hct 32.9, Plt 194   Assessment: Autumn Le is a 76 y.o. female presenting for AAA repair. PMH significant for AAA, COPD, GERD, HLD, HTN, hx SAH. Patient was not on [facility] PTA per chart review. Patient underwent AAA repair 12/20 with vascular surgery. Patient found to have diminished pulses in LLE. Plan for angiogram 12/21. Pharmacy has been consulted to initiate and manage heparin infusion.   *HL below goal after holding infusion for procedure. Noted infusion resumed at 1449 (only 4hr prior to level) and no thrombolytics used during procedure.*  Goal of Therapy:  Heparin level 0.3-0.7 units/ml Monitor platelets by anticoagulation protocol: Yes   12/21@ 0230 HL  = 0.14, Subtherapeutic @ 500 un/hr 12/21@1813  HL = 0.14, subtherapeutic, heparin stopped for procedure and re-started at 1449  Plan:  Subtherapeutic level after holding infusion for procedure.  Will order heparin 1100 units IV X 1 bolus, but keep current rate unchanged. Repeat HL 8 hrs after bolus to determine if rate need to change.  Shubham Thackston Rodriguez-Guzman PharmD, BCPS 11/02/2022 7:34 PM

## 2022-11-02 NOTE — Progress Notes (Signed)
Patient transported to vascular lab for procedure prior to shift assessment.

## 2022-11-02 NOTE — Progress Notes (Signed)
Patient doing better but still has some numbness in her foot.  We discussed the angiogram findings and she is agreeable to proceeding with left femoral endarterectomy tomorrow.  Leg is marked and risks and benefits are discussed.

## 2022-11-02 NOTE — Interval H&P Note (Signed)
History and Physical Interval Note:  11/02/2022 7:42 AM  Autumn Le  has presented today for surgery, with the diagnosis of leg ischemia.  The various methods of treatment have been discussed with the patient and family. She has developed left leg ischemia. After consideration of risks, benefits and other options for treatment, the patient has consented to  Procedure(s): Lower Extremity Angiography (Left) as a surgical intervention.  The patient's history has been reviewed, patient examined, no change in status, stable for surgery.  I have reviewed the patient's chart and labs.  Questions were answered to the patient's satisfaction.     [name]

## 2022-11-02 NOTE — Progress Notes (Signed)
Patient attempted to get out of bed without assistance. RN saw patient standing at end of bed with cords, foley, and IV line pulling taut. RN assisted patient to sit back down on bed. Patient irritable and stated "I can't stand all of this stuff I need to get out of here". Educated patient on importance of medical equipment. Patient is alert and oriented and aware of plan of care, following commands, pupils equal round and reactive. Patient assisted back in bed, all equipment in place. Floor mats in place, bed alarm on, non-grip socks on patient. Educated patient on use of call bell and need for assistance when getting up.   Throughout shift patient has been forgetful in regards to situation and has stated multiple times that she does not remember having procedures. Dr. Wyn Quaker aware. I asked patient's niece at bedside this afternoon if patient has confusion at home. Niece admits that since patient's brain aneurysm the family has noticed some progressive worsening in forgetfulness and irritability over the years and are concerned about her going back home alone after current hospitalization.   TOC ordered placed.

## 2022-11-02 NOTE — Op Note (Signed)
[facility]  Percutaneous Study/Intervention Procedural Note   Date of Surgery: 11/02/2022  Surgeon(s):Dreshaun Stene    Assistants:none  Pre-operative Diagnosis: PAD with rest pain left lower extremity status post endovascular abdominal aortic aneurysm repair  Post-operative diagnosis:  Same  Procedure(s) Performed:             1.  Ultrasound guidance for vascular access left radial artery             2.  Catheter placement into left iliac artery from left radial approach             3.  Aortogram and selective left lower extremity angiogram             4.  Percutaneous transluminal angioplasty of left common femoral artery and superficial femoral artery with 5 mm and 6 mm diameter Lutonix drug-coated angioplasty balloons             5.  TR band placement left radial artery    EBL: 10 cc  Contrast: 40 cc  Fluoro Time: 6.1 minutes  Moderate Conscious Sedation Time: approximately 58 minutes using 2 mg of Versed and 75 mcg of Fentanyl              Indications:  Patient is a 76 y.o.female with rest pain of the left lower extremity after an endovascular abdominal aortic aneurysm repair yesterday.  The patient is brought in for angiography for further evaluation and potential treatment.  Due to the limb threatening nature of the situation, angiogram was performed for attempted limb salvage. The patient is aware that if the procedure fails, amputation would be expected.  The patient also understands that even with successful revascularization, amputation may still be required due to the severity of the situation.  Risks and benefits are discussed and informed consent is obtained.   Procedure:  The patient was identified and appropriate procedural time out was performed.  The patient was then placed supine on the table and prepped and draped in the usual sterile fashion. Moderate conscious sedation was administered during a face to face encounter with the patient  throughout the procedure with my supervision of the RN administering medicines and monitoring the patient's vital signs, pulse oximetry, telemetry and mental status throughout from the start of the procedure until the patient was taken to the recovery room. Ultrasound was used to evaluate the left radial artery.  It was patent but small.  A digital ultrasound image was acquired.  A micropuncture needle was used to access the left radial artery under direct ultrasound guidance and a permanent image was performed.  I then placed a micropuncture wire and a micropuncture sheath.  A 0.035 J wire was advanced without resistance and a 5Fr sheath was placed.  I then exchanged for an advantage wire and advanced a catheter down over the advantage wire into the descending thoracic aorta.  Pigtail catheter was placed into the proximal abdominal aorta just above the previous stent graft and an AP aortogram was performed. This demonstrated normal renal artery on the left and a patent stent on the right.  The aortic stent graft was patent and the limbs had good flow.  I then used a 125 cm Kumpe catheter and got into the left limb of the stent graft and advanced down to the left external iliac artery.  Selective left lower extremity angiogram was then performed. This demonstrated occlusion of the left common femoral artery with reconstitution of both the left profunda  femoris artery and left superficial femoral artery beyond the occlusion.  The vessel was heavily calcified and this was likely severe chronic disease in the area accessed with a large sheath yesterday. It was felt that it was in the patient's best interest to proceed with intervention after these images to avoid a second procedure and a larger amount of contrast and fluoroscopy based off of the findings from the initial angiogram, the radial artery was very small and I do not think it would take anything larger than the 5 French sheath. The patient was systemically  heparinized. I then used a Kumpe catheter and the 0.018 advantage wire to cross the occlusion and get into the mid SFA.  Using a 0.018 platform I took a 5 mm diameter by 10 cm length and 6 mm diameter by 6 cm length Lutonix drug-coated angioplasty balloon into the left common femoral artery and proximal SFA and inflated these up to 8 atm for 1 minute.  I then placed the Kumpe catheter back over the 0.018 wire with Suezanne Jacquet and performed completion imaging.  This now showed reasonable flow in the left common femoral artery but there still remained about a 50 to 60% residual stenosis.  There is now flow into the proximal superficial femoral artery and this appeared to proceed distally.  The left profunda femoris artery remained occluded.  At this point, we had done about all we could from an endovascular standpoint and her flow is certainly improved but not optimal.  A left femoral endarterectomy will likely be required. I elected to terminate the procedure. The sheath was removed and a TR band was placed on the left radial access site with excellent hemostatic result. The patient was taken to the recovery room in stable condition having tolerated the procedure well.  Findings:               Aortogram:  This demonstrated normal renal artery on the left and a patent stent on the right.  The aortic stent graft was patent and the limbs had good flow.              Left lower Extremity:  This demonstrated occlusion of the left common femoral artery with reconstitution of both the left profunda femoris artery and left superficial femoral artery beyond the occlusion.  The vessel was heavily calcified and this was likely severe chronic disease in the area accessed with a large sheath yesterday.   Disposition: Patient was taken to the recovery room in stable condition having tolerated the procedure well.  Complications: None  Leotis Pain 11/02/2022 9:28 AM   This note was created with Dragon [employer]  transcription system. Any errors in dictation are purely unintentional.

## 2022-11-02 NOTE — Progress Notes (Signed)
Verbal order from Dr. Wyn Quaker to begin deflating TR band at 1000

## 2022-11-02 NOTE — Consult Note (Signed)
ANTICOAGULATION CONSULT NOTE - Initial Consult  Pharmacy Consult for Heparin Infusion Indication:  arterial occlusion  Allergies  Allergen Reactions   Latex Hives    bandaids   Nickel Other (See Comments)    blisters    Patient Measurements: Height: 5\' 1"  (154.9 cm) Weight: 37.4 kg (82 lb 7.2 oz) IBW/kg (Calculated) : 47.8 Heparin Dosing Weight: 37.4 kg  Vital Signs: Temp: 98 F (36.7 C) (12/21 0000) Temp Source: Oral (12/21 0000) BP: 125/59 (12/21 0130) Pulse Rate: 67 (12/21 0130)  Labs: Recent Labs    10/30/22 1152 11/01/22 1110 11/02/22 0230  HGB 13.6 10.7* 10.2*  HCT 40.6 32.9* 30.7*  PLT 225 194 182  APTT  --  147*  --   LABPROT  --  14.9  --   INR  --  1.2  --   HEPARINUNFRC  --   --  0.14*  CREATININE 0.59 0.45  --      Estimated Creatinine Clearance: 35.9 mL/min (by C-G formula based on SCr of 0.45 mg/dL).   Medical History: Past Medical History:  Diagnosis Date   AAA (abdominal aortic aneurysm) (HCC)    a.) aortic 11/04/22 02/05/2020: infrarenal AAA measuring 4.2 x 4.6 cm; b.) AAA duplex 08/12/2020: measured 4.3 cm; c.) AAA duplex 02/23/2021: measured 4.53 x 4.31 cm; d.) AAA doppler 03/07/2022: measured 4.53 x 4.31 cm; e.) LD chest CT 06/14/2022: measured 4.6 cm; f.) CT abd: partially thrombosed measuring up to 5.0 cm (overread by vascular surgery suggested size actually larger at 5.2-5.3 cm)   Anemia    Aneurysm of cavernous portion of left internal carotid artery 01/23/2020   a.) IR angio 01/23/2020: 8.0 x 5.6 mm wide necked aneurysm arising from the horizontal segment of the cavernous LEFT ICA   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   a.) IR angio 01/23/2020: 3.9 x 4.6 mm aneurysm at the bifurcation of the A2/A3 segment into the pericallosal and callosal marginal arteries; b.) s/p coli embolization 06/04/2020   Aortic atherosclerosis (HCC)    CAD (coronary artery disease)    Compression fracture of L1 lumbar vertebra (HCC)    COPD (chronic  obstructive pulmonary disease) (HCC)    DDD (degenerative disc disease), cervical    Degenerative joint disease of cervical spine    Diverticulosis    Emphysema lung (HCC)    Eustachian tube dysfunction    GERD (gastroesophageal reflux disease)    HLD (hyperlipidemia)    HTN (hypertension)    Hypokalemia    Insomnia    Long term current use of antithrombotics/antiplatelets    a.) clopidogrel   Lung nodule    Meniere's disease in remission, bilateral    Osteopenia    PAF (paroxysmal atrial fibrillation) (HCC)    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   a.) IR angio 01/23/2020: 6.2 x 6.1 x 8.9 ruptured aneurysm arising from the origion of the RIGHT PCA projecting laterally --> coil embolization performed 01/23/2020   Subarachnoid 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 (HCC) 01/23/2020   a.) CT head 01/23/2020: extensive SAH about the RIGHT cerebral hemisphere and basal cisterns secondary to RIGHT PCA aneurysm rupture   Tobacco abuse    Unintentional weight loss    a.) reports eating well; eats 4-5 x/day and still feels hungry; b.) weight decreasing: 104 lb (02/2021), 88 lb (02/2022), 86 lb (08/2022), 83 lb (10/2022)   Vertigo     Medications:  Scheduled:   vitamin C  1,000 mg Oral Daily  aspirin EC  81 mg Oral Q0600   calcium carbonate  1 tablet Oral QHS   Chlorhexidine Gluconate Cloth  6 each Topical Daily   cholecalciferol  2,000 Units Oral QHS   clopidogrel  75 mg Oral Daily   docusate sodium  100 mg Oral BID   ezetimibe  10 mg Oral Daily   fluticasone furoate-vilanterol  1 puff Inhalation BH-q7a   heparin  1,100 Units Intravenous Once   loratadine  10 mg Oral QHS   multivitamin with minerals   Oral Q0600   rosuvastatin  5 mg Oral Daily   Infusions:   sodium chloride Stopped (11/02/22 0030)   sodium chloride     DOPamine     famotidine (PEPCID) IV Stopped (11/01/22 2244)   heparin 500 Units/hr (11/02/22  0100)   magnesium sulfate bolus IVPB     nitroGLYCERIN     PRN: sodium chloride, acetaminophen **OR** acetaminophen, alum & mag hydroxide-simeth, fluticasone, guaiFENesin-dextromethorphan, hydrALAZINE, labetalol, magnesium sulfate bolus IVPB, metoprolol tartrate, morphine injection, ondansetron (ZOFRAN) IV, ondansetron, mouth rinse, oxyCODONE-acetaminophen, phenol, potassium chloride  Assessment: Autumn Le is a 76 y.o. female presenting for AAA repair. PMH significant for AAA, COPD, GERD, HLD, HTN, hx SAH. Patient was not on [facility] PTA per chart review. Patient underwent AAA repair 12/20 with vascular surgery. Patient found to have diminished pulses in LLE. Plan for angiogram 12/21. Baseline aPTT likely elevated due to heparin 5000 units given in OR 12/20 @ 0856. Pharmacy has been consulted to initiate and manage heparin infusion.   Baseline Labs (12/20 @ 1110): aPTT 147, PT 14.9, INR 1.2, Hgb 10.7, Hct 32.9, Plt 194   Goal of Therapy:  Heparin level 0.3-0.7 units/ml Monitor platelets by anticoagulation protocol: Yes   Plan:  12/21:  HL @ 0230 = 0.14, SUBtherapeutic  Will order heparin 1100 units IV X 1 bolus and increase drip rate 650 units/hr.  Will recheck HL 8 hrs after rate change.   Autumn Le D 11/02/2022 3:10 AM

## 2022-11-03 ENCOUNTER — Encounter
Admission: RE | Disposition: A | Discharge status: Skilled Nursing Facility | Payer: Self-pay | Source: Home / Self Care | Attending: Vascular Surgery

## 2022-11-03 ENCOUNTER — Inpatient Hospital Stay: Payer: Medicare Other | Admitting: Anesthesiology

## 2022-11-03 ENCOUNTER — Other Ambulatory Visit: Payer: Self-pay

## 2022-11-03 ENCOUNTER — Encounter (INDEPENDENT_AMBULATORY_CARE_PROVIDER_SITE_OTHER): Payer: Medicare Other | Admitting: Vascular Surgery

## 2022-11-03 DIAGNOSIS — Z9889 Other specified postprocedural states: Secondary | ICD-10-CM | POA: Diagnosis not present

## 2022-11-03 DIAGNOSIS — Z95828 Presence of other vascular implants and grafts: Secondary | ICD-10-CM | POA: Diagnosis not present

## 2022-11-03 DIAGNOSIS — I70222 Atherosclerosis of native arteries of extremities with rest pain, left leg: Secondary | ICD-10-CM | POA: Diagnosis not present

## 2022-11-03 HISTORY — PX: ENDARTERECTOMY FEMORAL: SHX5804

## 2022-11-03 LAB — CBC
HCT: 28.5 % — ABNORMAL LOW (ref 36.0–46.0)
Hemoglobin: 9.4 g/dL — ABNORMAL LOW (ref 12.0–15.0)
MCH: 32.1 pg (ref 26.0–34.0)
MCHC: 33 g/dL (ref 30.0–36.0)
MCV: 97.3 fL (ref 80.0–100.0)
Platelets: 136 10*3/uL — ABNORMAL LOW (ref 150–400)
RBC: 2.93 MIL/uL — ABNORMAL LOW (ref 3.87–5.11)
RDW: 12.8 % (ref 11.5–15.5)
WBC: 10.8 10*3/uL — ABNORMAL HIGH (ref 4.0–10.5)
nRBC: 0 % (ref 0.0–0.2)

## 2022-11-03 LAB — HEPARIN LEVEL (UNFRACTIONATED): Heparin Unfractionated: 0.14 IU/mL — ABNORMAL LOW (ref 0.30–0.70)

## 2022-11-03 LAB — TYPE AND SCREEN
ABO/RH(D): A POS
Antibody Screen: NEGATIVE

## 2022-11-03 SURGERY — ENDARTERECTOMY, FEMORAL
Anesthesia: General | Site: Leg Upper

## 2022-11-03 MED ORDER — ROCURONIUM BROMIDE 100 MG/10ML IV SOLN
INTRAVENOUS | Status: DC | PRN
  Administered 2022-11-03: 50 mg via INTRAVENOUS

## 2022-11-03 MED ORDER — MIDAZOLAM HCL 2 MG/2ML IJ SOLN
INTRAMUSCULAR | Status: AC
  Filled 2022-11-03: qty 2

## 2022-11-03 MED ORDER — "VISTASEAL 4 ML SINGLE DOSE KIT "
PACK | CUTANEOUS | Status: DC | PRN
  Administered 2022-11-03: 4 mL via TOPICAL

## 2022-11-03 MED ORDER — FENTANYL CITRATE (PF) 100 MCG/2ML IJ SOLN
INTRAMUSCULAR | Status: AC
  Filled 2022-11-03: qty 2

## 2022-11-03 MED ORDER — FENTANYL CITRATE (PF) 100 MCG/2ML IJ SOLN
INTRAMUSCULAR | Status: DC | PRN
  Administered 2022-11-03 (×2): 50 ug via INTRAVENOUS

## 2022-11-03 MED ORDER — HEPARIN SODIUM (PORCINE) 5000 UNIT/ML IJ SOLN
INTRAMUSCULAR | Status: AC
  Filled 2022-11-03: qty 1

## 2022-11-03 MED ORDER — HEPARIN SODIUM (PORCINE) 1000 UNIT/ML IJ SOLN
INTRAMUSCULAR | Status: DC | PRN
  Administered 2022-11-03: 4000 [IU] via INTRAVENOUS

## 2022-11-03 MED ORDER — HEPARIN BOLUS VIA INFUSION
1100.0000 [IU] | Freq: Once | INTRAVENOUS | Status: AC
  Administered 2022-11-03: 1100 [IU] via INTRAVENOUS
  Filled 2022-11-03: qty 1100

## 2022-11-03 MED ORDER — HEPARIN 30,000 UNITS/1000 ML (OHS) CELLSAVER SOLUTION
Status: AC
  Filled 2022-11-03: qty 1000

## 2022-11-03 MED ORDER — "VISTASEAL 4 ML SINGLE DOSE KIT "
PACK | CUTANEOUS | Status: AC
  Filled 2022-11-03: qty 4

## 2022-11-03 MED ORDER — ACETAMINOPHEN 10 MG/ML IV SOLN
INTRAVENOUS | Status: DC | PRN
  Administered 2022-11-03: 500 mg via INTRAVENOUS

## 2022-11-03 MED ORDER — OXYCODONE HCL 5 MG PO TABS: 5.0000 mg | ORAL_TABLET | Freq: Once | ORAL | Status: DC | PRN

## 2022-11-03 MED ORDER — FENTANYL CITRATE (PF) 100 MCG/2ML IJ SOLN: 25.0000 ug | INTRAMUSCULAR | Status: DC | PRN

## 2022-11-03 MED ORDER — LIDOCAINE HCL (CARDIAC) PF 100 MG/5ML IV SOSY
PREFILLED_SYRINGE | INTRAVENOUS | Status: DC | PRN
  Administered 2022-11-03: 100 mg via INTRAVENOUS

## 2022-11-03 MED ORDER — SODIUM CHLORIDE 0.9 % IR SOLN: Status: DC | PRN

## 2022-11-03 MED ORDER — SUGAMMADEX SODIUM 200 MG/2ML IV SOLN
INTRAVENOUS | Status: DC | PRN
  Administered 2022-11-03: 100 mg via INTRAVENOUS

## 2022-11-03 MED ORDER — STERILE WATER FOR IRRIGATION IR SOLN
Status: DC | PRN
  Administered 2022-11-03: 500 mL

## 2022-11-03 MED ORDER — CEFAZOLIN SODIUM-DEXTROSE 2-4 GM/100ML-% IV SOLN
2.0000 g | INTRAVENOUS | Status: AC
  Administered 2022-11-03: 2 g via INTRAVENOUS
  Filled 2022-11-03: qty 100

## 2022-11-03 MED ORDER — MIDAZOLAM HCL 2 MG/2ML IJ SOLN
INTRAMUSCULAR | Status: DC | PRN
  Administered 2022-11-03 (×2): 1 mg via INTRAVENOUS

## 2022-11-03 MED ORDER — ACETAMINOPHEN 10 MG/ML IV SOLN
INTRAVENOUS | Status: AC
  Filled 2022-11-03: qty 100

## 2022-11-03 MED ORDER — DEXAMETHASONE SODIUM PHOSPHATE 10 MG/ML IJ SOLN
INTRAMUSCULAR | Status: DC | PRN
  Administered 2022-11-03: 5 mg via INTRAVENOUS

## 2022-11-03 MED ORDER — OXYCODONE HCL 5 MG/5ML PO SOLN: 5.0000 mg | Freq: Once | ORAL | Status: DC | PRN

## 2022-11-03 MED ORDER — PHENYLEPHRINE HCL-NACL 20-0.9 MG/250ML-% IV SOLN
INTRAVENOUS | Status: DC | PRN
  Administered 2022-11-03: 20 ug/min via INTRAVENOUS

## 2022-11-03 MED ORDER — PROPOFOL 10 MG/ML IV BOLUS
INTRAVENOUS | Status: DC | PRN
  Administered 2022-11-03: 120 mg via INTRAVENOUS

## 2022-11-03 MED ORDER — CHLORHEXIDINE GLUCONATE CLOTH 2 % EX PADS
6.0000 | MEDICATED_PAD | Freq: Once | CUTANEOUS | Status: AC
  Administered 2022-11-03: 6 via TOPICAL

## 2022-11-03 MED ORDER — DEXMEDETOMIDINE HCL IN NACL 80 MCG/20ML IV SOLN
INTRAVENOUS | Status: DC | PRN
  Administered 2022-11-03: 4 ug via BUCCAL

## 2022-11-03 MED ORDER — CHLORHEXIDINE GLUCONATE CLOTH 2 % EX PADS: 6.0000 | MEDICATED_PAD | Freq: Once | CUTANEOUS | Status: DC

## 2022-11-03 MED ORDER — PHENYLEPHRINE 80 MCG/ML (10ML) SYRINGE FOR IV PUSH (FOR BLOOD PRESSURE SUPPORT)
PREFILLED_SYRINGE | INTRAVENOUS | Status: DC | PRN
  Administered 2022-11-03 (×6): 80 ug via INTRAVENOUS

## 2022-11-03 SURGICAL SUPPLY — 71 items
ADH SKN CLS APL DERMABOND .7 (GAUZE/BANDAGES/DRESSINGS) ×2
APL PRP STRL LF DISP 70% ISPRP (MISCELLANEOUS) ×2
APPLIER CLIP 11 MED OPEN (CLIP)
APPLIER CLIP 9.375 SM OPEN (CLIP)
APR CLP MED 11 20 MLT OPN (CLIP)
APR CLP SM 9.3 20 MLT OPN (CLIP)
BAG DECANTER FOR FLEXI CONT (MISCELLANEOUS) ×2 IMPLANT
BAG ISL LRG 20X20 DRWSTRG (DRAPES)
BAG ISOLATATION DRAPE 20X20 ST (DRAPES) IMPLANT
BLADE SURG 15 STRL LF DISP TIS (BLADE) ×2 IMPLANT
BLADE SURG 15 STRL SS (BLADE) ×2
BLADE SURG SZ11 CARB STEEL (BLADE) ×2 IMPLANT
BOOT SUTURE AID YELLOW STND (SUTURE) ×2 IMPLANT
BRUSH SCRUB EZ  4% CHG (MISCELLANEOUS) ×2
BRUSH SCRUB EZ 4% CHG (MISCELLANEOUS) ×2 IMPLANT
CHLORAPREP W/TINT 26 (MISCELLANEOUS) ×2 IMPLANT
CLIP APPLIE 11 MED OPEN (CLIP) IMPLANT
CLIP APPLIE 9.375 SM OPEN (CLIP) IMPLANT
DERMABOND ADVANCED .7 DNX12 (GAUZE/BANDAGES/DRESSINGS) ×2 IMPLANT
DRAPE INCISE IOBAN 66X45 STRL (DRAPES) ×2 IMPLANT
DRAPE ISOLATE BAG 20X20 STRL (DRAPES)
DRSG OPSITE POSTOP 4X6 (GAUZE/BANDAGES/DRESSINGS) IMPLANT
ELECT CAUTERY BLADE 6.4 (BLADE) ×2 IMPLANT
ELECT REM PT RETURN 9FT ADLT (ELECTROSURGICAL) ×2
ELECTRODE REM PT RTRN 9FT ADLT (ELECTROSURGICAL) ×2 IMPLANT
GAUZE 4X4 16PLY ~~LOC~~+RFID DBL (SPONGE) ×2 IMPLANT
GLOVE BIO SURGEON STRL SZ7 (GLOVE) ×6 IMPLANT
GOWN STRL REUS W/ TWL LRG LVL3 (GOWN DISPOSABLE) ×4 IMPLANT
GOWN STRL REUS W/ TWL XL LVL3 (GOWN DISPOSABLE) ×4 IMPLANT
GOWN STRL REUS W/TWL LRG LVL3 (GOWN DISPOSABLE) ×4
GOWN STRL REUS W/TWL XL LVL3 (GOWN DISPOSABLE) ×4
GRAFT VASC PATCH XENOSURE 1X14 (Vascular Products) IMPLANT
HEMOSTAT SURGICEL 2X3 (HEMOSTASIS) ×2 IMPLANT
IV NS 500ML (IV SOLUTION) ×2
IV NS 500ML BAXH (IV SOLUTION) ×2 IMPLANT
KIT TURNOVER KIT A (KITS) ×2 IMPLANT
LABEL OR SOLS (LABEL) ×2 IMPLANT
LOOP RED MAXI  1X406MM (MISCELLANEOUS) ×4
LOOP VESSEL MAXI 1X406 RED (MISCELLANEOUS) ×4 IMPLANT
LOOP VESSEL MINI 0.8X406 BLUE (MISCELLANEOUS) ×4 IMPLANT
LOOPS BLUE MINI 0.8X406MM (MISCELLANEOUS) ×4
MANIFOLD NEPTUNE II (INSTRUMENTS) ×2 IMPLANT
NDL SAFETY ECLIP 18X1.5 (MISCELLANEOUS) ×2 IMPLANT
NS IRRIG 500ML POUR BTL (IV SOLUTION) ×2 IMPLANT
PACK BASIN MAJOR ARMC (MISCELLANEOUS) ×2 IMPLANT
PACK UNIVERSAL (MISCELLANEOUS) ×2 IMPLANT
RETRACTOR TRAXI PANNICULUS (MISCELLANEOUS) IMPLANT
SET WALTER ACTIVATION W/DRAPE (SET/KITS/TRAYS/PACK) ×2 IMPLANT
SPONGE T-LAP 18X18 ~~LOC~~+RFID (SPONGE) ×4 IMPLANT
SUT MNCRL 4-0 (SUTURE) ×2
SUT MNCRL 4-0 27XMFL (SUTURE) ×2
SUT PROLENE 5 0 RB 1 DA (SUTURE) ×4 IMPLANT
SUT PROLENE 6 0 BV (SUTURE) ×8 IMPLANT
SUT PROLENE 7 0 BV 1 (SUTURE) ×4 IMPLANT
SUT SILK 2 0 (SUTURE) ×2
SUT SILK 2-0 18XBRD TIE 12 (SUTURE) ×2 IMPLANT
SUT SILK 3 0 (SUTURE) ×2
SUT SILK 3-0 18XBRD TIE 12 (SUTURE) ×2 IMPLANT
SUT SILK 4 0 (SUTURE) ×2
SUT SILK 4-0 18XBRD TIE 12 (SUTURE) ×2 IMPLANT
SUT VIC AB 2-0 CT1 27 (SUTURE) ×4
SUT VIC AB 2-0 CT1 TAPERPNT 27 (SUTURE) ×4 IMPLANT
SUT VIC AB 3-0 SH 27 (SUTURE) ×2
SUT VIC AB 3-0 SH 27X BRD (SUTURE) ×2 IMPLANT
SUT VICRYL+ 3-0 36IN CT-1 (SUTURE) ×4 IMPLANT
SUTURE MNCRL 4-0 27XMF (SUTURE) ×2 IMPLANT
SYR 20ML LL LF (SYRINGE) ×2 IMPLANT
SYR 5ML LL (SYRINGE) ×2 IMPLANT
TRAP FLUID SMOKE EVACUATOR (MISCELLANEOUS) ×2 IMPLANT
TRAY FOLEY MTR SLVR 16FR STAT (SET/KITS/TRAYS/PACK) ×2 IMPLANT
WATER STERILE IRR 500ML POUR (IV SOLUTION) ×2 IMPLANT

## 2022-11-03 NOTE — Anesthesia Postprocedure Evaluation (Signed)
Anesthesia Post Note  Patient: Autumn Le  Procedure(s) Performed: ENDARTERECTOMY FEMORAL (Left: Leg Upper) APPLICATION OF CELL SAVER (Leg Upper)  Patient location during evaluation: PACU Anesthesia Type: General Level of consciousness: awake and alert Pain management: pain level controlled Vital Signs Assessment: post-procedure vital signs reviewed and stable Respiratory status: spontaneous breathing, nonlabored ventilation, respiratory function stable and patient connected to nasal cannula oxygen Cardiovascular status: blood pressure returned to baseline and stable Postop Assessment: no apparent nausea or vomiting Anesthetic complications: no   No notable events documented.   Last Vitals:  Vitals:   11/03/22 1445 11/03/22 1500  BP: (!) 120/54 (!) 126/58  Pulse: (!) 58 (!) 59  Resp: 12 12  Temp:    SpO2: 98% 99%    Last Pain:  Vitals:   11/03/22 1434  TempSrc:   PainSc: Asleep                 Cleda Mccreedy Nayleah Gamel

## 2022-11-03 NOTE — Anesthesia Preprocedure Evaluation (Addendum)
Anesthesia Evaluation  Patient identified by MRN, date of birth, ID band Patient awake    Reviewed: Allergy & Precautions, NPO status , Patient's Chart, lab work & pertinent test results  History of Anesthesia Complications Negative for: history of anesthetic complications  Airway Mallampati: III  TM Distance: <3 FB Neck ROM: full    Dental  (+) Chipped, Poor Dentition, Missing   Pulmonary shortness of breath and with exertion, COPD, Current Smoker and Patient abstained from smoking.   Pulmonary exam normal        Cardiovascular Exercise Tolerance: Good hypertension, (-) angina + CAD  Normal cardiovascular exam     Neuro/Psych negative neurological ROS  negative psych ROS   GI/Hepatic Neg liver ROS,GERD  Controlled,,  Endo/Other  negative endocrine ROS    Renal/GU      Musculoskeletal   Abdominal   Peds  Hematology negative hematology ROS (+)   Anesthesia Other Findings Past Medical History: No date: AAA (abdominal aortic aneurysm) (HCC)     Comment:  a.) aortic US 02/05/2020: infrarenal AAA measuring 4.2 x              4.6 cm; b.) AAA duplex 08/12/2020: measured 4.3 cm; c.)               AAA duplex 02/23/2021: measured 4.53 x 4.31 cm; d.) AAA               doppler 03/07/2022: measured 4.53 x 4.31 cm; e.) LD chest              CT 06/14/2022: measured 4.6 cm; f.) CT abd: partially               thrombosed measuring up to 5.0 cm (overread by vascular               surgery suggested size actually larger at 5.2-5.3 cm) No date: Anemia 01/23/2020: 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     Comment:  a.) IR angio 01/23/2020: 8.0 x 5.6 mm wide necked               aneurysm arising from the horizontal segment of the               cavernous LEFT ICA 01/23/2020: Anterior communicating artery aneurysm     Comment:  a.) IR angio 01/23/2020: 3.9 x 4.6 mm aneurysm at the               bifurcation  of the A2/A3 segment into the pericallosal               and callosal marginal arteries; b.) s/p coli embolization              06/04/2020 No date: Aortic atherosclerosis (HCC) No date: CAD (coronary artery disease) No date: Compression fracture of L1 lumbar vertebra (HCC) No date: COPD (chronic obstructive pulmonary disease) (HCC) No date: DDD (degenerative disc disease), cervical No date: Degenerative joint disease of cervical spine No date: Diverticulosis No date: Emphysema lung (HCC) No date: Eustachian tube dysfunction No date: GERD (gastroesophageal reflux disease) No date: HLD (hyperlipidemia) No date: HTN (hypertension) No date: Hypokalemia No date: Insomnia No date: Long term current use of antithrombotics/antiplatelets     Comment:  a.) clopidogrel No date: Lung nodule No date: Meniere's disease in remission, bilateral No date: Osteopenia No date: PAF (paroxysmal atrial fibrillation) ([place]) 01/23/2020: 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  ([place])  Comment:  a.) IR angio 01/23/2020: 6.2 x 6.1 x 8.9 ruptured               aneurysm arising from the origion of the RIGHT PCA               projecting laterally --> coil embolization performed               01/23/2020 01/23/2020: 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 [facility])     Comment:  a.) CT head 01/23/2020: extensive SAH about the RIGHT               cerebral hemisphere and basal cisterns secondary to RIGHT              PCA aneurysm rupture No date: Tobacco abuse No date: Unintentional weight loss     Comment:  a.) reports eating well; eats 4-5 x/day and still feels               hungry; b.) weight decreasing: 104 lb (02/2021), 88 lb               (02/2022), 86 lb (08/2022), 83 lb (10/2022) No date: Vertigo  Past Surgical History: 2021: BRAIN SURGERY No date: CATARACT EXTRACTION W/ INTRAOCULAR LENS  IMPLANT, BILATERAL 11/01/2022: ENDOVASCULAR REPAIR/STENT  GRAFT; N/A     Comment:  Procedure: ENDOVASCULAR REPAIR/STENT GRAFT;  Surgeon:               Algernon Huxley, MD;  Location: [facility];                Service: Cardiovascular;  Laterality: N/A; No date: EYE SURGERY 01/23/2020: IR ANGIO INTRA EXTRACRAN SEL INTERNAL CAROTID BILAT MOD  SED 06/04/2020: IR ANGIO INTRA EXTRACRAN SEL INTERNAL CAROTID UNI R MOD  SED 01/23/2020: IR ANGIO VERTEBRAL SEL VERTEBRAL UNI L MOD SED 01/23/2020: IR ANGIOGRAM FOLLOW UP STUDY 01/23/2020: IR ANGIOGRAM FOLLOW UP STUDY 01/23/2020: IR ANGIOGRAM FOLLOW UP STUDY 01/23/2020: IR ANGIOGRAM FOLLOW UP STUDY 06/04/2020: IR ANGIOGRAM FOLLOW UP STUDY 06/04/2020: IR ANGIOGRAM FOLLOW UP STUDY 06/04/2020: IR NEURO EACH ADD'L AFTER BASIC UNI RIGHT (MS) 01/23/2020: IR TRANSCATH/EMBOLIZ 06/04/2020: IR TRANSCATH/EMBOLIZ 06/04/2020: IR US GUIDE VASC ACCESS RIGHT 06/23/2021: MYRINGOTOMY WITH TUBE PLACEMENT; Left     Comment:  Procedure: MYRINGOTOMY WITH TUBE PLACEMENT;  Surgeon:               Margaretha Sheffield, MD;  Location: [facility];                Service: ENT;  Laterality: Left; 01/23/2020: RADIOLOGY WITH ANESTHESIA; N/A     Comment:  Procedure: RADIOLOGY WITH ANESTHESIA;  Surgeon:               Consuella Lose, MD;  Location: [facility];  Service:               Radiology;  Laterality: N/A; 06/04/2020: RADIOLOGY WITH ANESTHESIA; N/A     Comment:  Procedure: Coil embolization of left ACA aneurysm;                Surgeon: Consuella Lose, MD;  Location: [facility];                Service: Radiology;  Laterality: N/A; No date: TONSILLECTOMY  BMI    Body Mass Index: 15.58 kg/m      Reproductive/Obstetrics negative OB ROS  Anesthesia Physical Anesthesia Plan  ASA: 3  Anesthesia Plan: General ETT   Post-op Pain Management:    Induction: Intravenous  PONV Risk Score and Plan: Ondansetron, Dexamethasone, Midazolam and Treatment may vary due to age  or medical condition  Airway Management Planned: Oral ETT  Additional Equipment:   Intra-op Plan:   Post-operative Plan: Extubation in OR and Possible Post-op intubation/ventilation  Informed Consent: I have reviewed the patients History and Physical, chart, labs and discussed the procedure including the risks, benefits and alternatives for the proposed anesthesia with the patient or authorized representative who has indicated his/her understanding and acceptance.     Dental Advisory Given  Plan Discussed with: Anesthesiologist, CRNA and Surgeon  Anesthesia Plan Comments: (Patient consented for risks of anesthesia including but not limited to:  - adverse reactions to medications - damage to eyes, teeth, lips or other oral mucosa - nerve damage due to positioning  - sore throat or hoarseness - Damage to heart, brain, nerves, lungs, other parts of body or loss of life  Patient voiced understanding.)        Anesthesia Quick Evaluation

## 2022-11-03 NOTE — TOC Initial Note (Signed)
Transition of Care [facility]) - Initial/Assessment Note    Patient Details  Name: Autumn Le MRN: [id number] Date of Birth: 07-29-1946  [facility] CM/SW Contact:    Georgie Chard, LCSW Phone Number: 11/03/2022, 10:26 AM  Clinical Narrative:                 CSW spoke to the patient, The patient I really wanting to go to a rehab states that this is the best thing to do at this time. The patient has a niece and a sister that are her support. The patient stated that she is wanting to get stronger and the only way to do that is to go to a rehab at this time. TOC will start the process for SNF placement.  Expected Discharge Plan: Skilled Nursing Facility     Patient Goals and CMS Choice Patient states their goals for this hospitalization and ongoing recovery are:: Patient stated that she would like to go to rehab and get stronger. CMS Medicare.gov Compare Post Acute Care list provided to:: Patient Choice offered to / list presented to : Patient      Expected Discharge Plan and Services     Post Acute Care Choice: Skilled Nursing Facility Living arrangements for the past 2 months: Apartment                                      Prior Living Arrangements/Services Living arrangements for the past 2 months: Apartment Lives with:: Self Patient language and need for interpreter reviewed:: No Do you feel safe going back to the place where you live?: Yes        Care giver support system in place?: Yes (comment) (Niece)      Activities of Daily Living Home Assistive Devices/Equipment: None ADL Screening (condition at time of admission) Patient's cognitive ability adequate to safely complete daily activities?: Yes Is the patient deaf or have difficulty hearing?: No Does the patient have difficulty seeing, even when wearing glasses/contacts?: No Does the patient have difficulty concentrating, remembering, or making decisions?: No Patient able to express need  for assistance with ADLs?: Yes Does the patient have difficulty dressing or bathing?: No Independently performs ADLs?: Yes (appropriate for developmental age) Does the patient have difficulty walking or climbing stairs?: No Weakness of Legs: None Weakness of Arms/Hands: None  Permission Sought/Granted Permission sought to share information with : Case Manager Permission granted to share information with : Yes, Release of Information Signed              Emotional Assessment Appearance:: Appears younger than stated age   Affect (typically observed): Accepting, Hopeful Orientation: : Oriented to Self, Oriented to Place, Oriented to  Time, Oriented to Situation, Fluctuating Orientation (Suspected and/or reported Sundowners) Alcohol / Substance Use: Tobacco Use Psych Involvement: No (comment)  Admission diagnosis:  AAA (abdominal aortic aneurysm) without rupture (HCC) [I71.40] Patient Active Problem List   Diagnosis Date Noted   Body mass index (BMI) 19.9 or less, adult 06/30/2020   Cerebral aneurysm, nonruptured 06/04/2020   Elevated blood-pressure reading, without diagnosis of hypertension 03/31/2020   History of ruptured arterial aneurysm 02/25/2020   AAA (abdominal aortic aneurysm) without rupture (HCC)    Acute blood loss anemia    Hypokalemia    Leukocytosis    Essential hypertension    Agitation    Ruptured cerebral aneurysm (HCC) 01/31/2020   SAH (  subarachnoid hemorrhage) (HCC) 01/23/2020   DNR (do not resuscitate) 07/09/2019   Hyperlipidemia, mixed 07/09/2019   Osteopenia of lumbar spine 01/08/2019   Meniere's disease of both ears 06/23/2016   Lung nodule 02/14/2016   Other emphysema (HCC) 02/14/2016   Degenerative cervical disc 11/30/2015   Tobacco abuse 11/30/2015   PCP:  Marisue Ivan, MD Pharmacy:   [facility] [phone] - [place], [place] - [address] [place] [address] Phone: [phone] Fax: [phone]  [facility]  - [place], [place] - [address] [place] [address] Phone: [phone] Fax: [phone]  [facility] [address] [place] [address] Phone: [phone] Fax: [phone]     Social Determinants of Health (SDOH) Social History: SDOH Screenings   Food Insecurity: No Food Insecurity (11/01/2022)  Housing: Low Risk  (11/01/2022)  Transportation Needs: No Transportation Needs (11/01/2022)  Utilities: Not At Risk (11/01/2022)  Depression (PHQ2-9): Low Risk  (12/23/2020)  Tobacco Use: High Risk (11/02/2022)   SDOH Interventions:     Readmission Risk Interventions     No data to display

## 2022-11-03 NOTE — Interval H&P Note (Signed)
History and Physical Interval Note:  11/03/2022 11:28 AM  Autumn Le  has presented today for surgery, with the diagnosis of Large Blood Vessel in Left Leg with Plaque Buildup.  The various methods of treatment have been discussed with the patient and family. After consideration of risks, benefits and other options for treatment, the patient has consented to  Procedure(s): ENDARTERECTOMY FEMORAL (Left) APPLICATION OF CELL SAVER (N/A) as a surgical intervention.  The patient's history has been reviewed, patient examined, no change in status, stable for surgery.  I have reviewed the patient's chart and labs.  Questions were answered to the patient's satisfaction.     [name]

## 2022-11-03 NOTE — Consult Note (Signed)
ANTICOAGULATION CONSULT NOTE - Initial Consult  Pharmacy Consult for Heparin Infusion Indication:  arterial occlusion  Allergies  Allergen Reactions   Latex Hives    bandaids   Nickel Other (See Comments)    blisters    Patient Measurements: Height: 5\' 1"  (154.9 cm) Weight: 37.4 kg (82 lb 7.2 oz) IBW/kg (Calculated) : 47.8 Heparin Dosing Weight: 37.4 kg  Vital Signs: Temp: 99.1 F (37.3 C) (12/22 0000) Temp Source: Oral (12/22 0000) BP: 112/54 (12/22 0400) Pulse Rate: 69 (12/22 0400)  Labs: Recent Labs    11/01/22 1110 11/02/22 0230 11/02/22 1813 11/03/22 0337  HGB 10.7* 10.2*  --  9.4*  HCT 32.9* 30.7*  --  28.5*  PLT 194 182  --  136*  APTT 147*  --   --   --   LABPROT 14.9  --   --   --   INR 1.2  --   --   --   HEPARINUNFRC  --  0.14* 0.14* 0.14*  CREATININE 0.45 0.54  --   --      Estimated Creatinine Clearance: 35.9 mL/min (by C-G formula based on SCr of 0.54 mg/dL).   Medical History: Past Medical History:  Diagnosis Date   AAA (abdominal aortic aneurysm) (HCC)    a.) aortic 11/05/22 02/05/2020: infrarenal AAA measuring 4.2 x 4.6 cm; b.) AAA duplex 08/12/2020: measured 4.3 cm; c.) AAA duplex 02/23/2021: measured 4.53 x 4.31 cm; d.) AAA doppler 03/07/2022: measured 4.53 x 4.31 cm; e.) LD chest CT 06/14/2022: measured 4.6 cm; f.) CT abd: partially thrombosed measuring up to 5.0 cm (overread by vascular surgery suggested size actually larger at 5.2-5.3 cm)   Anemia    Aneurysm of cavernous portion of left internal carotid artery 01/23/2020   a.) IR angio 01/23/2020: 8.0 x 5.6 mm wide necked aneurysm arising from the horizontal segment of the cavernous LEFT ICA   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   a.) IR angio 01/23/2020: 3.9 x 4.6 mm aneurysm at the bifurcation of the A2/A3 segment into the pericallosal and callosal marginal arteries; b.) s/p coli embolization 06/04/2020   Aortic atherosclerosis (HCC)    CAD (coronary artery disease)     Compression fracture of L1 lumbar vertebra (HCC)    COPD (chronic obstructive pulmonary disease) (HCC)    DDD (degenerative disc disease), cervical    Degenerative joint disease of cervical spine    Diverticulosis    Emphysema lung (HCC)    Eustachian tube dysfunction    GERD (gastroesophageal reflux disease)    HLD (hyperlipidemia)    HTN (hypertension)    Hypokalemia    Insomnia    Long term current use of antithrombotics/antiplatelets    a.) clopidogrel   Lung nodule    Meniere's disease in remission, bilateral    Osteopenia    PAF (paroxysmal atrial fibrillation) (HCC)    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   a.) IR angio 01/23/2020: 6.2 x 6.1 x 8.9 ruptured aneurysm arising from the origion of the RIGHT PCA projecting laterally --> coil embolization performed 01/23/2020   Subarachnoid 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 (HCC) 01/23/2020   a.) CT head 01/23/2020: extensive SAH about the RIGHT cerebral hemisphere and basal cisterns secondary to RIGHT PCA aneurysm rupture   Tobacco abuse    Unintentional weight loss    a.) reports eating well; eats 4-5 x/day and still feels hungry; b.) weight decreasing: 104 lb (02/2021), 88 lb (02/2022), 86 lb (08/2022),  83 lb (10/2022)   Vertigo     Medications:  Scheduled:   vitamin C  1,000 mg Oral Daily   aspirin EC  81 mg Oral Q0600   calcium carbonate  1 tablet Oral QHS   Chlorhexidine Gluconate Cloth  6 each Topical Daily   Chlorhexidine Gluconate Cloth  6 each Topical Once   cholecalciferol  2,000 Units Oral QHS   clopidogrel  75 mg Oral Daily   docusate sodium  100 mg Oral BID   ezetimibe  10 mg Oral Daily   fluticasone furoate-vilanterol  1 puff Inhalation [id number]   heparin  1,100 Units Intravenous Once   loratadine  10 mg Oral QHS   multivitamin with minerals   Oral Q0600   rosuvastatin  5 mg Oral Daily   sodium chloride flush  3 mL Intravenous Q12H    Infusions:   sodium chloride Stopped (11/02/22 1000)   sodium chloride     sodium chloride      ceFAZolin (ANCEF) IV     DOPamine     famotidine (PEPCID) IV Stopped (11/02/22 1627)   heparin 650 Units/hr (11/03/22 0300)   magnesium sulfate bolus IVPB     nitroGLYCERIN     PRN: sodium chloride, sodium chloride, acetaminophen **OR** acetaminophen, alum & mag hydroxide-simeth, fluticasone, guaiFENesin-dextromethorphan, hydrALAZINE, HYDROmorphone (DILAUDID) injection, labetalol, magnesium sulfate bolus IVPB, metoprolol tartrate, morphine injection, ondansetron (ZOFRAN) IV, mouth rinse, oxyCODONE-acetaminophen, phenol, potassium chloride, sodium chloride flush  Baseline Labs (12/20 @ 1110): aPTT 147, PT 14.9, INR 1.2, Hgb 10.7, Hct 32.9, Plt 194   Assessment: Autumn Le is a 76 y.o. female presenting for AAA repair. PMH significant for AAA, COPD, GERD, HLD, HTN, hx SAH. Patient was not on [facility] PTA per chart review. Patient underwent AAA repair 12/20 with vascular surgery. Patient found to have diminished pulses in LLE. Plan for angiogram 12/21. Pharmacy has been consulted to initiate and manage heparin infusion.   *HL below goal after holding infusion for procedure. Noted infusion resumed at 1449 (only 4hr prior to level) and no thrombolytics used during procedure.*  Goal of Therapy:  Heparin level 0.3-0.7 units/ml Monitor platelets by anticoagulation protocol: Yes   12/21@ 0230 HL  = 0.14, Subtherapeutic @ 500 un/hr 12/21@1813  HL = 0.14, subtherapeutic, heparin stopped for procedure and re-started at 1449 12/2 @ 0337 HL = 0.14, SUBtherapeutic   Plan:  12/22: HL @ 0337 = 0.14, SUBtherapeutic  Will order heparin 1100 units IV X 1 bolus and increase drip rate to 800 units/hr.  Will recheck HL 8 hrs after rate change.   Richrd Kuzniar D 11/03/2022 4:58 AM

## 2022-11-03 NOTE — Op Note (Signed)
OPERATIVE NOTE   PROCEDURE: 1.   Left common femoral, profunda femoris, and superficial femoral artery endarterectomies and patch angioplasty    PRE-OPERATIVE DIAGNOSIS: 1.Atherosclerotic occlusive disease left lower extremities with rest pain   POST-OPERATIVE DIAGNOSIS: Same  SURGEON: Leotis Pain, MD  ANESTHESIA:  general  ESTIMATED BLOOD LOSS: 50 cc  FINDING(S): 1.  significant plaque in left common femoral, profunda femoris, and superficial femoral arteries  SPECIMEN(S):  Left common femoral, profunda femoris, and superficial femoral artery plaque.  INDICATIONS:    Patient presents with rest pain of the left leg.  She has femoral artery occlusion after previous endovascular aneurysm repair and a large amount of chronic disease seen in the left femoral artery bifurcation on an angiogram.  Left femoral endarterectomy is planned to try to improve perfusion.  The risks and benefits as well as alternative therapies including intervention were reviewed in detail all questions were answered the patient agrees to proceed with surgery.  DESCRIPTION: After obtaining full informed written consent, the patient was brought back to the operating room and placed supine upon the operating table.  The patient received IV antibiotics prior to induction.  After obtaining adequate anesthesia, the patient was prepped and draped in the standard fashion appropriate time out is called.    Vertical incision was created overlying the left femoral arteries. The common femoral artery proximally, and superficial femoral artery, and primary profunda femoris artery branches were encircled with vessel loops and prepared for control. The left femoral arteries were found to have significant plaque from the common femoral artery into the profunda and superficial femoral arteries.   4000 units of heparin was given and allowed circulate for 5 minutes.   Attention is then turned to the left femoral artery.  An  arteriotomy is made with 11 blade and extended with Potts scissors in the common femoral artery and carried down onto the first 2-3 cm of the superficial femoral artery. An endarterectomy was then performed. The [facility] was used to create a plane. The proximal endpoint was cut flush with tenotomy scissors. This was in the proximal common femoral artery. An extensive eversion endarterectomy was then performed for the first 3-4 cm of the profunda femorus artery with a nice feathered endpoint created. Good backbleeding was then seen. The distal endpoint of the superficial femoral endarterectomy was created with gentle traction and the distal endpoint was tacked down with three 7-0 Prolene sutures.  The Bovine pericardial patcth is then selected and prepared for a patch angioplasty.  It is cut and beveled and started at the proximal endpoint with a 6-0 Prolene suture.  Approximately one half of the suture line is run medially and laterally and the distal end point was cut and bevelled to match the arteriotomy.  A second 6-0 Prolene was started at the distal end point and run to the mid portion to complete the arteriotomy.  The vessel was flushed prior to release of control and completion of the anastomosis.  At this point, flow was established first to the profunda femoris artery and then to the superficial femoral artery. Easily palpable pulses are noted well beyond the anastomosis and both arteries.  Surgicel and Vistacel topical hemostatic agents were placed in the femoral incision and hemostasis was complete. The femoral incision was then closed in a layered fashion with 2 layers of 2-0 Vicryl, 2 layers of 3-0 Vicryl, and 4-0 Monocryl for the skin closure. Dermabond and sterile dressing were then placed over the incision.  The patient  was then awakened from anesthesia and taken to the recovery room in stable condition having tolerated the procedure well.  COMPLICATIONS: None  CONDITION:  Stable     [name] 11/03/2022 2:32 PM   This note was created with Dragon Medical transcription system. Any errors in dictation are purely unintentional.

## 2022-11-03 NOTE — Transfer of Care (Signed)
Immediate Anesthesia Transfer of Care Note  Patient: Autumn Le  Procedure(s) Performed: ENDARTERECTOMY FEMORAL (Left: Leg Upper) APPLICATION OF CELL SAVER (Leg Upper)  Patient Location: PACU  Anesthesia Type:General  Level of Consciousness: sedated  Airway & Oxygen Therapy: Patient Spontanous Breathing  Post-op Assessment: Report given to RN and Post -op Vital signs reviewed and stable  Post vital signs: Reviewed and stable  Last Vitals:  Vitals Value Taken Time  BP 143/52 11/03/22 1433  Temp    Pulse 75 11/03/22 1436  Resp 26 11/03/22 1437  SpO2 100 % 11/03/22 1436  Vitals shown include unvalidated device data.  Last Pain:  Vitals:   11/03/22 0900  TempSrc: Oral  PainSc:       Patients Stated Pain Goal: 0 (11/02/22 0906)  Complications: No notable events documented.

## 2022-11-03 NOTE — Plan of Care (Signed)
  Problem: Clinical Measurements: Goal: Ability to maintain clinical measurements within normal limits will improve Outcome: Progressing   

## 2022-11-03 NOTE — Progress Notes (Signed)
CSW has reached out to the provider. PT will see the patient after surgery.

## 2022-11-03 NOTE — Anesthesia Procedure Notes (Signed)
Procedure Name: Intubation Date/Time: 11/03/2022 12:23 PM  Performed by: Cheral Bay, CRNAPre-anesthesia Checklist: Patient identified, Emergency Drugs available, Suction available and Patient being monitored Patient Re-evaluated:Patient Re-evaluated prior to induction Oxygen Delivery Method: Circle system utilized Preoxygenation: Pre-oxygenation with 100% oxygen Induction Type: IV induction Ventilation: Mask ventilation without difficulty Laryngoscope Size: McGraph and 3 Grade View: Grade I Tube type: Oral Tube size: 7.0 mm Number of attempts: 1 Airway Equipment and Method: Stylet Placement Confirmation: ETT inserted through vocal cords under direct vision, positive ETCO2 and breath sounds checked- equal and bilateral Secured at: 21 cm Tube secured with: Tape Dental Injury: Teeth and Oropharynx as per pre-operative assessment

## 2022-11-03 NOTE — Social Work (Signed)
CSW called niece, N/A left VM

## 2022-11-03 NOTE — Progress Notes (Signed)
Pt was received from OR via bed accompanied by RN and NT. Report received from RN at bedside. Pt responsive and appropriate to verbal stimulation; denies pain. Left groin incision site is CDI and covered with honeycomb dressing. Left lower extremity is warm with good DP pulse. NSR on monitor. VSS, see flowsheet. Message sent to Dr. Wyn Quaker for clarification regarding diet and IVF.

## 2022-11-04 LAB — CBC
HCT: 25.1 % — ABNORMAL LOW (ref 36.0–46.0)
Hemoglobin: 8.5 g/dL — ABNORMAL LOW (ref 12.0–15.0)
MCH: 33.2 pg (ref 26.0–34.0)
MCHC: 33.9 g/dL (ref 30.0–36.0)
MCV: 98 fL (ref 80.0–100.0)
Platelets: 152 10*3/uL (ref 150–400)
RBC: 2.56 MIL/uL — ABNORMAL LOW (ref 3.87–5.11)
RDW: 12.8 % (ref 11.5–15.5)
WBC: 12.2 10*3/uL — ABNORMAL HIGH (ref 4.0–10.5)
nRBC: 0 % (ref 0.0–0.2)

## 2022-11-04 MED ORDER — NICOTINE 21 MG/24HR TD PT24
21.0000 mg | MEDICATED_PATCH | Freq: Every day | TRANSDERMAL | Status: DC
  Administered 2022-11-04 – 2022-11-09 (×6): 21 mg via TRANSDERMAL
  Filled 2022-11-04 (×6): qty 1

## 2022-11-04 MED ORDER — FAMOTIDINE 20 MG PO TABS
20.0000 mg | ORAL_TABLET | Freq: Two times a day (BID) | ORAL | Status: DC
  Administered 2022-11-04 – 2022-11-09 (×10): 20 mg via ORAL
  Filled 2022-11-04 (×10): qty 1

## 2022-11-04 NOTE — [place] FL2 (Signed)
MEDICAID [id number] LEVEL OF CARE FORM     IDENTIFICATION  Patient Name: Autumn Le Birthdate: 04-20-1946 Sex: female Admission Date (Current Location): 11/01/2022  [facility] and [place] Number:  Chiropodist and Address:  [facility], [address] [place], [place] [address]      Provider Number: [id number]  Attending Physician Name and Address:  Annice Needy, MD  Relative Name and Phone Number:  Arletta Bale [phone]    Current Level of Care: Hospital Recommended Level of Care: Skilled Nursing Facility Prior Approval Number:    Date Approved/Denied: 11/03/22 PASRR Number: [id number]  Discharge Plan: SNF    Current Diagnoses: Patient Active Problem List   Diagnosis Date Noted   Body mass index (BMI) 19.9 or less, adult 06/30/2020   Cerebral aneurysm, nonruptured 06/04/2020   Elevated blood-pressure reading, without diagnosis of hypertension 03/31/2020   History of ruptured arterial aneurysm 02/25/2020   AAA (abdominal aortic aneurysm) without rupture (HCC)    Acute blood loss anemia    Hypokalemia    Leukocytosis    Essential hypertension    Agitation    Ruptured cerebral aneurysm (HCC) 01/31/2020   SAH (subarachnoid hemorrhage) (HCC) 01/23/2020   DNR (do not resuscitate) 07/09/2019   Hyperlipidemia, mixed 07/09/2019   Osteopenia of lumbar spine 01/08/2019   Meniere's disease of both ears 06/23/2016   Lung nodule 02/14/2016   Other emphysema (HCC) 02/14/2016   Degenerative cervical disc 11/30/2015   Tobacco abuse 11/30/2015    Orientation RESPIRATION BLADDER Height & Weight     Self, Time, Situation  Normal Incontinent, External catheter Weight: 82 lb 7.2 oz (37.4 kg) Height:  5\' 1"  (154.9 cm)  BEHAVIORAL SYMPTOMS/MOOD NEUROLOGICAL BOWEL NUTRITION STATUS      Continent Diet (see dc summary)  AMBULATORY STATUS COMMUNICATION OF NEEDS Skin   Limited Assist Verbally Surgical wounds (Closed incison 11/03/22-R  and L groin)                       Personal Care Assistance Level of Assistance  Bathing, Feeding, Dressing Bathing Assistance: Limited assistance Feeding assistance: Independent Dressing Assistance: Limited assistance     Functional Limitations Info  Sight, Hearing, Speech Sight Info: Adequate Hearing Info: Adequate Speech Info: Adequate    SPECIAL CARE FACTORS FREQUENCY  OT (By licensed OT), PT (By licensed PT)     PT Frequency: 5x week OT Frequency: 5x week            Contractures Contractures Info: Not present    Additional Factors Info  Code Status, Allergies Code Status Info: Full Allergies Info: Latex, Nickel           Current Medications (11/04/2022):  This is the current hospital active medication list Current Facility-Administered Medications  Medication Dose Route Frequency Provider Last Rate Last Admin   0.9 %  sodium chloride infusion   Intravenous Continuous 11/06/2022, MD   Stopped at 11/04/22 1227   0.9 %  sodium chloride infusion  500 mL Intravenous Once PRN 11/06/22, MD       0.9 %  sodium chloride infusion  250 mL Intravenous PRN Annice Needy, MD 10 mL/hr at 11/03/22 1209 Restarted at 11/03/22 1421   acetaminophen (TYLENOL) tablet 325-650 mg  325-650 mg Oral Q4H PRN 11/05/22, MD   650 mg at 11/02/22 2001   Or   acetaminophen (TYLENOL) suppository 325-650 mg  325-650 mg Rectal Q4H PRN  Annice Needy, MD       alum & mag hydroxide-simeth (MAALOX/MYLANTA) 200-200-20 MG/5ML suspension 15-30 mL  15-30 mL Oral Q2H PRN Annice Needy, MD       ascorbic acid (VITAMIN C) tablet 1,000 mg  1,000 mg Oral Daily Annice Needy, MD   1,000 mg at 11/04/22 0912   aspirin EC tablet 81 mg  81 mg Oral Q0600 Annice Needy, MD   81 mg at 11/04/22 9211   calcium carbonate (OS-CAL - dosed in mg of elemental calcium) tablet 1,250 mg  1 tablet Oral QHS Annice Needy, MD   1,250 mg at 11/03/22 2138   Chlorhexidine Gluconate Cloth 2 % PADS 6 each  6 each Topical Daily  Annice Needy, MD   6 each at 11/03/22 1056   Chlorhexidine Gluconate Cloth 2 % PADS 6 each  6 each Topical Once Annice Needy, MD       cholecalciferol (VITAMIN D3) 25 MCG (1000 UNIT) tablet 2,000 Units  2,000 Units Oral QHS Annice Needy, MD   2,000 Units at 11/03/22 2138   clopidogrel (PLAVIX) tablet 75 mg  75 mg Oral Daily Annice Needy, MD   75 mg at 11/04/22 0911   docusate sodium (COLACE) capsule 100 mg  100 mg Oral BID Annice Needy, MD   100 mg at 11/03/22 2138   DOPamine (INTROPIN) 800 mg in dextrose 5 % 250 mL (3.2 mg/mL) infusion  3-5 mcg/kg/min Intravenous Continuous Annice Needy, MD       ezetimibe (ZETIA) tablet 10 mg  10 mg Oral Daily Annice Needy, MD   10 mg at 11/04/22 1228   famotidine (PEPCID) tablet 20 mg  20 mg Oral BID Sharen Hones, RPH       fluticasone (FLONASE) 50 MCG/ACT nasal spray 2 spray  2 spray Each Nare PRN Annice Needy, MD       fluticasone furoate-vilanterol (BREO ELLIPTA) 100-25 MCG/ACT 1 puff  1 puff Inhalation BH-q7a Dew, Jason S, MD   1 puff at 11/04/22 9417   guaiFENesin-dextromethorphan (ROBITUSSIN DM) 100-10 MG/5ML syrup 15 mL  15 mL Oral Q4H PRN Annice Needy, MD       hydrALAZINE (APRESOLINE) injection 5 mg  5 mg Intravenous Q20 Min PRN Dew, Marlow Baars, MD       HYDROmorphone (DILAUDID) injection 1 mg  1 mg Intravenous Once PRN Annice Needy, MD       labetalol (NORMODYNE) injection 10 mg  10 mg Intravenous Q10 min PRN Annice Needy, MD   10 mg at 11/01/22 1030   loratadine (CLARITIN) tablet 10 mg  10 mg Oral QHS Annice Needy, MD   10 mg at 11/03/22 2138   magnesium sulfate IVPB 2 g 50 mL  2 g Intravenous Daily PRN Annice Needy, MD       metoprolol tartrate (LOPRESSOR) injection 2-5 mg  2-5 mg Intravenous Q2H PRN Annice Needy, MD       morphine (PF) 4 MG/ML injection 2-5 mg  2-5 mg Intravenous Q1H PRN Annice Needy, MD   2 mg at 11/01/22 2010   multivitamin with minerals tablet   Oral Q0600 Annice Needy, MD   1 tablet at 11/04/22 1228   nitroGLYCERIN 50 mg in  dextrose 5 % 250 mL (0.2 mg/mL) infusion  5-250 mcg/min Intravenous Titrated Annice Needy, MD       ondansetron [facility]) injection 4  mg  4 mg Intravenous Q6H PRN Annice Needy, MD   4 mg at 11/03/22 1403   Oral care mouth rinse  15 mL Mouth Rinse PRN Dew, Marlow Baars, MD       oxyCODONE-acetaminophen (PERCOCET/ROXICET) 5-325 MG per tablet 1-2 tablet  1-2 tablet Oral Q4H PRN Annice Needy, MD   1 tablet at 11/04/22 0912   phenol (CHLORASEPTIC) mouth spray 1 spray  1 spray Mouth/Throat PRN Annice Needy, MD       potassium chloride SA (KLOR-CON M) CR tablet 20-40 mEq  20-40 mEq Oral Daily PRN Annice Needy, MD       rosuvastatin (CRESTOR) tablet 5 mg  5 mg Oral Daily Annice Needy, MD   5 mg at 11/04/22 0911   sodium chloride flush (NS) 0.9 % injection 3 mL  3 mL Intravenous Q12H Annice Needy, MD   3 mL at 11/03/22 2146   sodium chloride flush (NS) 0.9 % injection 3 mL  3 mL Intravenous PRN Annice Needy, MD         Discharge Medications: Please see discharge summary for a list of discharge medications.  Relevant Imaging Results:  Relevant Lab Results:   Additional Information SSN [id number]  Carmina Miller, LCSWA

## 2022-11-04 NOTE — Evaluation (Signed)
Physical Therapy Evaluation Patient Details Name: Autumn Le MRN: [id number] DOB: November 19, 1945 Today's Date: 11/04/2022  History of Present Illness  Patient is a 76 year old female who underwent an endarterectomy femoral surgery on 11/01/22 for her AAA. After surgery pulses were absent on LLE resulting in additional surgery 12/121 of LE angiography. On 12/22 patient underwent L common femoral, profunda femoris, and superficial femoral artery endarterectomies and patch angioplasty. Pmh includes AAA, COPD, DJD of cervical spine, GERD, HLD, HTN, meniere's disease, osteopenia, subarachnoid hemorrhage, and vertigo.   Clinical Impression  Patient is a 76 year old female with generalized weakness and limited mobility s/p surgery. Prior to hospital admission, pt was independent and lives alone.  Patient is in bed upon PT arrival and agreeable to evaluation. She does have limited L foot sensation; nursing aware of deficit. She has generalized LE weakness present. Upon ambulation her RR raised to >35 requiring her to return to seated position. Supine strengthening interventions tolerated well with patient demonstrated understanding. Patient agreeable to SNF as she is cognizant of her current fall risk. Patient is in bed with all needs met. Pt would benefit from skilled PT to address noted impairments and functional limitations (see below for any additional details).  Upon hospital discharge, pt would benefit from SNF due to fall risk and limited mobility.        Recommendations for follow up therapy are one component of a multi-disciplinary discharge planning process, led by the attending physician.  Recommendations may be updated based on patient status, additional functional criteria and insurance authorization.  Follow Up Recommendations Skilled nursing-short term rehab (<3 hours/day) Can patient physically be transported by private vehicle: Yes    Assistance Recommended at Discharge Frequent or  constant Supervision/Assistance  Patient can return home with the following  A little help with walking and/or transfers;A little help with bathing/dressing/bathroom;Assistance with cooking/housework;Direct supervision/assist for medications management;Assist for transportation;Help with stairs or ramp for entrance    Equipment Recommendations Rolling walker (2 wheels)  Recommendations for Other Services       Functional Status Assessment Patient has had a recent decline in their functional status and demonstrates the ability to make significant improvements in function in a reasonable and predictable amount of time.     Precautions / Restrictions Precautions Precautions: Fall Restrictions Weight Bearing Restrictions: No      Mobility  Bed Mobility Overal bed mobility: Needs Assistance Bed Mobility: Supine to Sit, Sit to Supine     Supine to sit: Min assist Sit to supine: Supervision   General bed mobility comments: Patient requires min A for trunk assist from supine to sitting position, but is able to pull legs and transision sitting to supine with extra time.    Transfers Overall transfer level: Needs assistance Equipment used: Rolling walker (2 wheels) Transfers: Sit to/from Stand Sit to Stand: Min guard           General transfer comment: Patient requires cues for safety with CGA.    Ambulation/Gait Ambulation/Gait assistance: Min guard Gait Distance (Feet): 45 Feet Assistive device: Rolling walker (2 wheels) Gait Pattern/deviations: Shuffle, Decreased stride length, Decreased stance time - left Gait velocity: decreased     General Gait Details: Patient has increased RR>35 with ambulation limiting ambulation duration  Stairs            Wheelchair Mobility    Modified Rankin (Stroke Patients Only)       Balance Overall balance assessment: Needs assistance Sitting-balance support: Feet supported, No  upper extremity supported Sitting  balance-Leahy Scale: Fair Sitting balance - Comments: able to static sit without LOB   Standing balance support: Bilateral upper extremity supported, During functional activity Standing balance-Leahy Scale: Fair Standing balance comment: requires use of RW for ambulation due to fatigue                             Pertinent Vitals/Pain Pain Assessment Pain Assessment: 0-10 Pain Score: 2  Pain Location: buttocks Pain Descriptors / Indicators: Aching Pain Intervention(s): Repositioned, Monitored during session    Home Living Family/patient expects to be discharged to:: Private residence Living Arrangements: Alone Available Help at Discharge: Family;Available PRN/intermittently Type of Home: Apartment Home Access: Elevator;Stairs to enter Entrance Stairs-Rails: Right;Left Entrance Stairs-Number of Steps: 4 steps to enter building   Home Layout: Other (Comment) (lives on 5th floor, has elevator) Home Equipment: Grab bars - tub/shower Additional Comments: sits in shower; denies having shower seat to shower, ind at baseline    Prior Function Prior Level of Function : Independent/Modified Independent             Mobility Comments: per patient does not use AD at baseline ADLs Comments: ind per patient     Hand Dominance   Dominant Hand: Right    Extremity/Trunk Assessment   Upper Extremity Assessment Upper Extremity Assessment: Defer to OT evaluation    Lower Extremity Assessment Lower Extremity Assessment: Generalized weakness (deferred LLE testing due to recent procedure. RLE 3/5 ; decreased sensation to L foot)       Communication   Communication: No difficulties  Cognition Arousal/Alertness: Awake/alert Behavior During Therapy: WFL for tasks assessed/performed Overall Cognitive Status: No family/caregiver present to determine baseline cognitive functioning                                 General Comments: Patient A and O x 4,  intermittent agitation noted        General Comments General comments (skin integrity, edema, etc.): patient appears very thin, limited muscle mass    Exercises Low Level/ICU Exercises Ankle Circles/Pumps: Strengthening, Both, 10 reps, Supine Hip ABduction/ADduction: Strengthening, Both, 10 reps, Supine Heel Slides: Strengthening, Both, 10 reps, Supine Other Exercises Other Exercises: Patient educated on safe mobility and transfers, role of PT in acute care setting, supine strengthening exercises: glute squeezes 10x, SLR 10x, heel slide 10x, hip abduction 10x each LE   Assessment/Plan    PT Assessment Patient needs continued PT services  PT Problem List Decreased strength;Decreased activity tolerance;Decreased mobility;Decreased balance;Impaired sensation       PT Treatment Interventions DME instruction;Gait training;Stair training;Therapeutic exercise;Therapeutic activities;Functional mobility training;Balance training;Neuromuscular re-education;Cognitive remediation;Patient/family education    PT Goals (Current goals can be found in the Care Plan section)  Acute Rehab PT Goals Patient Stated Goal: to get stronger PT Goal Formulation: With patient Time For Goal Achievement: 11/18/22 Potential to Achieve Goals: Fair    Frequency Min 2X/week     Co-evaluation               AM-PAC PT "6 Clicks" Mobility  Outcome Measure Help needed turning from your back to your side while in a flat bed without using bedrails?: A Little Help needed moving from lying on your back to sitting on the side of a flat bed without using bedrails?: A Lot Help needed moving to and from a bed to a chair (  including a wheelchair)?: A Little Help needed standing up from a chair using your arms (e.g., wheelchair or bedside chair)?: A Little Help needed to walk in hospital room?: A Little Help needed climbing 3-5 steps with a railing? : A Lot 6 Click Score: 16    End of Session Equipment Utilized  During Treatment: Gait belt Activity Tolerance: Patient limited by fatigue Patient left: in bed;with call bell/phone within reach;with bed alarm set;Other (comment) (floor mats re-applied.) Nurse Communication: Mobility status PT Visit Diagnosis: Unsteadiness on feet (R26.81);Other abnormalities of gait and mobility (R26.89);Muscle weakness (generalized) (M62.81);Difficulty in walking, not elsewhere classified (R26.2)    Time: 2725-3664 PT Time Calculation (min) (ACUTE ONLY): 28 min   Charges:   PT Evaluation $PT Eval Moderate Complexity: 1 Mod PT Treatments $Therapeutic Exercise: 8-22 mins        Janna Arch, PT, DPT  11/04/2022, 1:58 PM

## 2022-11-04 NOTE — Progress Notes (Signed)
1 Day Post-Op   Subjective/Chief Complaint: Complains of left groin incisional pain- controlled with current regimen. Notes some numbness in LEFT foot- improving. Otherwise without complaint. Per nursing- some confusion last night- improving.  Objective: Vital signs in last 24 hours: Temp:  [97.6 F (36.4 C)-99.2 F (37.3 C)] 99.2 F (37.3 C) (12/23 0400) Pulse Rate:  [57-78] 73 (12/23 0700) Resp:  [12-21] 19 (12/23 0700) BP: (88-145)/(41-99) 131/57 (12/23 0700) SpO2:  [97 %-100 %] 98 % (12/23 0700) Last BM Date :  (PTA)  Intake/Output from previous day: 12/22 0701 - 12/23 0700 In: 902500.1 [P.O.:360; I.V.:902040.1; IV Piggyback:100] Out: 920 [Urine:900; Blood:20] Intake/Output this shift: No intake/output data recorded.  General appearance: alert and no distress Resp: clear to auscultation bilaterally Cardio: regular rate and rhythm GI: soft, non-tender; bowel sounds normal; no masses,  no organomegaly Extremities: warm, RIGHT groin access site- soft, no hematoma or ecchymosis; LEFT groin incision- C/D/I Pulses: 2+ and symmetric LEFT- palpable DP/PT; Right multiphasic DP/PT  Lab Results:  Recent Labs    11/03/22 0337 11/04/22 0716  WBC 10.8* 12.2*  HGB 9.4* 8.5*  HCT 28.5* 25.1*  PLT 136* 152   BMET Recent Labs    11/01/22 1110 11/02/22 0230  NA 139 140  K 3.7 3.8  CL 110 110  CO2 22 24  GLUCOSE 189* 119*  BUN 13 9  CREATININE 0.45 0.54  CALCIUM 8.1* 8.2*   PT/INR Recent Labs    11/01/22 1110  LABPROT 14.9  INR 1.2   ABG No results for input(s): "PHART", "HCO3" in the last 72 hours.  Invalid input(s): "PCO2", "PO2"  Studies/Results: No results found.  Anti-infectives: Anti-infectives (From admission, onward)    Start     Dose/Rate Route Frequency Ordered Stop   11/03/22 0600  ceFAZolin (ANCEF) IVPB 2g/100 mL premix        2 g 200 mL/hr over 30 Minutes Intravenous On call to O.R. 11/03/22 0225 11/03/22 1250   11/02/22 0652  ceFAZolin  (ANCEF) 2-4 GM/100ML-% IVPB       Note to Pharmacy: Rita Ohara H: cabinet override      11/02/22 0652 11/02/22 0758   11/02/22 0640  ceFAZolin (ANCEF) IVPB 2g/100 mL premix        2 g 200 mL/hr over 30 Minutes Intravenous 30 min pre-op 11/02/22 0640 11/02/22 0828   11/01/22 1600  ceFAZolin (ANCEF) IVPB 2g/100 mL premix        2 g 200 mL/hr over 30 Minutes Intravenous Every 8 hours 11/01/22 1055 11/02/22 0101   11/01/22 0730  ceFAZolin (ANCEF) IVPB 2g/100 mL premix        2 g 200 mL/hr over 30 Minutes Intravenous On call to O.R. 11/01/22 1751 11/01/22 0847       Assessment/Plan: POD #3/1 s/p EVAR; LEFT femoral artery endarterectomy D/C foley Begin PT- OOB Encourage PO- Ensure Will likely need SNF- Case management   LOS: 3 days    Eli Hose A 11/04/2022

## 2022-11-05 LAB — COMPREHENSIVE METABOLIC PANEL
ALT: 8 U/L (ref 0–44)
AST: 18 U/L (ref 15–41)
Albumin: 2.2 g/dL — ABNORMAL LOW (ref 3.5–5.0)
Alkaline Phosphatase: 51 U/L (ref 38–126)
Anion gap: 3 — ABNORMAL LOW (ref 5–15)
BUN: 13 mg/dL (ref 8–23)
CO2: 25 mmol/L (ref 22–32)
Calcium: 7.9 mg/dL — ABNORMAL LOW (ref 8.9–10.3)
Chloride: 111 mmol/L (ref 98–111)
Creatinine, Ser: 0.46 mg/dL (ref 0.44–1.00)
GFR, Estimated: >60 mL/min (ref >60)
Glucose, Bld: 185 mg/dL — ABNORMAL HIGH (ref 70–99)
Potassium: 2.9 mmol/L — ABNORMAL LOW (ref 3.5–5.1)
Sodium: 139 mmol/L (ref 135–145)
Total Bilirubin: 0.8 mg/dL (ref 0.3–1.2)
Total Protein: 4.9 g/dL — ABNORMAL LOW (ref 6.5–8.1)

## 2022-11-05 MED ORDER — POTASSIUM CHLORIDE CRYS ER 20 MEQ PO TBCR
40.0000 meq | EXTENDED_RELEASE_TABLET | Freq: Once | ORAL | Status: AC
  Administered 2022-11-05: 40 meq via ORAL
  Filled 2022-11-05: qty 2

## 2022-11-05 NOTE — Progress Notes (Signed)
2 Days Post-Op   Subjective/Chief Complaint: Doing much better this morning. Not as confused. Pain controlled. Worked with PT yesterday- recommended SNF. Otherwise without complaint.   Objective: Vital signs in last 24 hours: Temp:  [98.2 F (36.8 C)-99.8 F (37.7 C)] 99.4 F (37.4 C) (12/24 0000) Pulse Rate:  [64-87] 72 (12/24 0600) Resp:  [16-24] 18 (12/24 0600) BP: (88-124)/(45-73) 105/52 (12/24 0600) SpO2:  [94 %-100 %] 94 % (12/24 0600) Last BM Date : 11/05/22  Intake/Output from previous day: 12/23 0701 - 12/24 0700 In: 789.6 [P.O.:240; I.V.:449.6; IV Piggyback:100] Out: 350 [Urine:350] Intake/Output this shift: No intake/output data recorded.  General appearance: alert and no distress Resp: clear to auscultation bilaterally Cardio: regular rate and rhythm GI: soft, non-tender; bowel sounds normal; no masses,  no organomegaly Extremities: warm, RIGHT groin access site- soft, no hematoma or ecchymosis; LEFT groin incision- C/D/I Pulses: 2+ and symmetric LEFT- palpable DP/PT; Right multiphasic DP/PT  Lab Results:  Recent Labs    11/03/22 0337 11/04/22 0716  WBC 10.8* 12.2*  HGB 9.4* 8.5*  HCT 28.5* 25.1*  PLT 136* 152   BMET No results for input(s): "NA", "K", "CL", "CO2", "GLUCOSE", "BUN", "CREATININE", "CALCIUM" in the last 72 hours. PT/INR No results for input(s): "LABPROT", "INR" in the last 72 hours. ABG No results for input(s): "PHART", "HCO3" in the last 72 hours.  Invalid input(s): "PCO2", "PO2"  Studies/Results: No results found.  Anti-infectives: Anti-infectives (From admission, onward)    Start     Dose/Rate Route Frequency Ordered Stop   11/03/22 0600  ceFAZolin (ANCEF) IVPB 2g/100 mL premix        2 g 200 mL/hr over 30 Minutes Intravenous On call to O.R. 11/03/22 0225 11/03/22 1250   11/02/22 0652  ceFAZolin (ANCEF) 2-4 GM/100ML-% IVPB       Note to Pharmacy: Rita Ohara H: cabinet override      11/02/22 0652 11/02/22 0758    11/02/22 0640  ceFAZolin (ANCEF) IVPB 2g/100 mL premix        2 g 200 mL/hr over 30 Minutes Intravenous 30 min pre-op 11/02/22 0640 11/02/22 0828   11/01/22 1600  ceFAZolin (ANCEF) IVPB 2g/100 mL premix        2 g 200 mL/hr over 30 Minutes Intravenous Every 8 hours 11/01/22 1055 11/02/22 0101   11/01/22 0730  ceFAZolin (ANCEF) IVPB 2g/100 mL premix        2 g 200 mL/hr over 30 Minutes Intravenous On call to O.R. 11/01/22 7340 11/01/22 0847       Assessment/Plan: POD #4/2 s/p EVAR; LEFT femoral artery endarterectomy  Continue PT/OOB OK to transfer to PCU SNF next week once able to arrange  LOS: 4 days    Eli Hose A 11/05/2022

## 2022-11-05 NOTE — Progress Notes (Signed)
Attempted to call report; nurse unavailable at this time. Will attempt in 5-10 min per unit request.

## 2022-11-05 NOTE — Progress Notes (Signed)
Report was given to care nurse and pt was transported to room 256 via bed on telemetry accompanied by RN and NT. Call bell, phone are within reach, bed alarm is on. Care nurse arrived to room prior to RN departure.

## 2022-11-06 ENCOUNTER — Encounter: Payer: Self-pay | Admitting: Vascular Surgery

## 2022-11-06 LAB — BASIC METABOLIC PANEL
Anion gap: 4 — ABNORMAL LOW (ref 5–15)
BUN: 11 mg/dL (ref 8–23)
CO2: 27 mmol/L (ref 22–32)
Calcium: 8.3 mg/dL — ABNORMAL LOW (ref 8.9–10.3)
Chloride: 113 mmol/L — ABNORMAL HIGH (ref 98–111)
Creatinine, Ser: 0.42 mg/dL — ABNORMAL LOW (ref 0.44–1.00)
GFR, Estimated: >60 mL/min (ref >60)
Glucose, Bld: 99 mg/dL (ref 70–99)
Potassium: 3.5 mmol/L (ref 3.5–5.1)
Sodium: 144 mmol/L (ref 135–145)

## 2022-11-06 NOTE — Progress Notes (Signed)
3 Days Post-Op   Subjective/Chief Complaint: Feeling much better. Confusion resolved. Minimal pain. Tolerating PO   Objective: Vital signs in last 24 hours: Temp:  [97.9 F (36.6 C)-99.7 F (37.6 C)] 98.3 F (36.8 C) (12/25 0318) Pulse Rate:  [63-87] 63 (12/25 0318) Resp:  [16-24] 16 (12/25 0318) BP: (96-126)/(47-64) 126/58 (12/25 0318) SpO2:  [97 %-100 %] 98 % (12/25 0318) Last BM Date : 11/05/22  Intake/Output from previous day: 12/24 0701 - 12/25 0700 In: 3 [I.V.:3] Out: -  Intake/Output this shift: No intake/output data recorded.  General appearance: alert and no distress Resp: clear to auscultation bilaterally Cardio: regular rate and rhythm Extremities: LEFT groin incision- C/D/I; right- access site soft, +DP/PT   ABD: soft, NT/ND  Lab Results:  Recent Labs    11/04/22 0716  WBC 12.2*  HGB 8.5*  HCT 25.1*  PLT 152   BMET Recent Labs    11/05/22 0841 11/06/22 0437  NA 139 144  K 2.9* 3.5  CL 111 113*  CO2 25 27  GLUCOSE 185* 99  BUN 13 11  CREATININE 0.46 0.42*  CALCIUM 7.9* 8.3*   PT/INR No results for input(s): "LABPROT", "INR" in the last 72 hours. ABG No results for input(s): "PHART", "HCO3" in the last 72 hours.  Invalid input(s): "PCO2", "PO2"  Studies/Results: No results found.  Anti-infectives: Anti-infectives (From admission, onward)    Start     Dose/Rate Route Frequency Ordered Stop   11/03/22 0600  ceFAZolin (ANCEF) IVPB 2g/100 mL premix        2 g 200 mL/hr over 30 Minutes Intravenous On call to O.R. 11/03/22 0225 11/03/22 1250   11/02/22 0652  ceFAZolin (ANCEF) 2-4 GM/100ML-% IVPB       Note to Pharmacy: Rita Ohara H: cabinet override      11/02/22 0652 11/02/22 0758   11/02/22 0640  ceFAZolin (ANCEF) IVPB 2g/100 mL premix        2 g 200 mL/hr over 30 Minutes Intravenous 30 min pre-op 11/02/22 0640 11/02/22 0828   11/01/22 1600  ceFAZolin (ANCEF) IVPB 2g/100 mL premix        2 g 200 mL/hr over 30 Minutes  Intravenous Every 8 hours 11/01/22 1055 11/02/22 0101   11/01/22 0730  ceFAZolin (ANCEF) IVPB 2g/100 mL premix        2 g 200 mL/hr over 30 Minutes Intravenous On call to O.R. 11/01/22 3299 11/01/22 0847       Assessment/Plan: POD #5/3 s/p EVAR; LEFT femoral artery endarterectomy  Continue PT/OOB SNF next week once able to arrange   LOS: 5 days    Eli Hose A 11/06/2022

## 2022-11-07 ENCOUNTER — Encounter: Payer: Self-pay | Admitting: Vascular Surgery

## 2022-11-07 LAB — COMPREHENSIVE METABOLIC PANEL
ALT: 6 U/L (ref 0–44)
AST: 15 U/L (ref 15–41)
Albumin: 2.4 g/dL — ABNORMAL LOW (ref 3.5–5.0)
Alkaline Phosphatase: 49 U/L (ref 38–126)
Anion gap: 7 (ref 5–15)
BUN: 10 mg/dL (ref 8–23)
CO2: 24 mmol/L (ref 22–32)
Calcium: 8.5 mg/dL — ABNORMAL LOW (ref 8.9–10.3)
Chloride: 110 mmol/L (ref 98–111)
Creatinine, Ser: 0.43 mg/dL — ABNORMAL LOW (ref 0.44–1.00)
GFR, Estimated: >60 mL/min (ref >60)
Glucose, Bld: 97 mg/dL (ref 70–99)
Potassium: 3.8 mmol/L (ref 3.5–5.1)
Sodium: 141 mmol/L (ref 135–145)
Total Bilirubin: 0.6 mg/dL (ref 0.3–1.2)
Total Protein: 5.4 g/dL — ABNORMAL LOW (ref 6.5–8.1)

## 2022-11-07 LAB — CBC
HCT: 26.4 % — ABNORMAL LOW (ref 36.0–46.0)
Hemoglobin: 8.6 g/dL — ABNORMAL LOW (ref 12.0–15.0)
MCH: 32.2 pg (ref 26.0–34.0)
MCHC: 32.6 g/dL (ref 30.0–36.0)
MCV: 98.9 fL (ref 80.0–100.0)
Platelets: 212 10*3/uL (ref 150–400)
RBC: 2.67 MIL/uL — ABNORMAL LOW (ref 3.87–5.11)
RDW: 12.8 % (ref 11.5–15.5)
WBC: 7.9 10*3/uL (ref 4.0–10.5)
nRBC: 0 % (ref 0.0–0.2)

## 2022-11-07 MED ORDER — SENNA 8.6 MG PO TABS
1.0000 | ORAL_TABLET | Freq: Every day | ORAL | Status: DC
  Administered 2022-11-07 – 2022-11-08 (×2): 8.6 mg via ORAL
  Filled 2022-11-07 (×2): qty 1

## 2022-11-07 NOTE — Progress Notes (Signed)
Mobility Specialist - Progress Note    11/07/22 1132  Mobility  Activity Ambulated with assistance in hallway;Stood at bedside  Level of Assistance Standby assist, set-up cues, supervision of patient - no hands on  Assistive Device Other (Comment) (HHA)  Distance Ambulated (ft) 200 ft  Activity Response Tolerated well  Mobility Referral Yes  $Mobility charge 1 Mobility   Pt resting in recliner on RA upon entry. Pt STS and ambulates SBA with HHA in hallway around NS. Pt returned to chair and left with needs in reach. Pt chair alarm activated.   Johnathan Hausen Mobility Specialist 11/07/22, 11:36 AM

## 2022-11-07 NOTE — Progress Notes (Signed)
Physical Therapy Treatment Patient Details Name: Autumn Le MRN: [id number] DOB: 02-Feb-1946 Today's Date: 11/07/2022   History of Present Illness Patient is a 76 year old female who underwent an endarterectomy femoral surgery on 11/01/22 for her AAA. After surgery pulses were absent on LLE resulting in additional surgery 12/121 of LE angiography. On 12/22 patient underwent L common femoral, profunda femoris, and superficial femoral artery endarterectomies and patch angioplasty. Pmh includes AAA, COPD, DJD of cervical spine, GERD, HLD, HTN, meniere's disease, osteopenia, subarachnoid hemorrhage, and vertigo.    PT Comments    Patient ambulated in hallway without assistive device with gait pattern worsening with fatigue, including narrow base of support, occasional veering to the left, and varying step length/height. Steadying assistance and safety cues provided intermittently as needed. Patient does have mild decreased awareness of physical limitations. Although patient is making progress with functional independence, SNF is recommended at discharge for ongoing PT to maximize independence and decrease caregiver burden.    Recommendations for follow up therapy are one component of a multi-disciplinary discharge planning process, led by the attending physician.  Recommendations may be updated based on patient status, additional functional criteria and insurance authorization.  Follow Up Recommendations  Skilled nursing-short term rehab (<3 hours/day) Can patient physically be transported by private vehicle: Yes   Assistance Recommended at Discharge Frequent or constant Supervision/Assistance  Patient can return home with the following A little help with walking and/or transfers;A little help with bathing/dressing/bathroom;Assistance with cooking/housework;Direct supervision/assist for medications management;Assist for transportation;Help with stairs or ramp for entrance   Equipment  Recommendations  Rolling walker (2 wheels)    Recommendations for Other Services       Precautions / Restrictions Precautions Precautions: Fall     Mobility  Bed Mobility Overal bed mobility: Needs Assistance Bed Mobility: Supine to Sit, Sit to Supine     Supine to sit: Min guard Sit to supine: Min guard   General bed mobility comments: increased time and effort required    Transfers Overall transfer level: Needs assistance Equipment used: None Transfers: Sit to/from Stand Sit to Stand: Min guard           General transfer comment: Min guard for safety    Ambulation/Gait Ambulation/Gait assistance: Min guard, Min assist Gait Distance (Feet): 200 Feet Assistive device: None Gait Pattern/deviations: Drifts right/left, Narrow base of support Gait velocity: decreased     General Gait Details: fluctuating step length with occasional veering to the left noted with ambulation. with increased ambulation distance, patient fatigues with gait pattern worsening with varying step length and more narrow base of support. educated patient to be aware that physical fatigue can impact functional independence and increase risk for falls. occasional safety cues and steadying assistance required for ambulation without assistive device.   Stairs             Wheelchair Mobility    Modified Rankin (Stroke Patients Only)       Balance Overall balance assessment: Needs assistance Sitting-balance support: Feet supported, No upper extremity supported Sitting balance-Leahy Scale: Fair     Standing balance support: Bilateral upper extremity supported, During functional activity Standing balance-Leahy Scale: Fair                              Cognition Arousal/Alertness: Awake/alert Behavior During Therapy: WFL for tasks assessed/performed Overall Cognitive Status: No family/caregiver present to determine baseline cognitive functioning  General Comments: no agitation noted, however patient appears to have decrease awareness of deficits and need for physical assistance at times        Exercises Other Exercises Other Exercises: heart rate 85bpm after walking with no significant shortness of breath noted with activity    General Comments        Pertinent Vitals/Pain Pain Assessment Pain Assessment: No/denies pain    Home Living                          Prior Function            PT Goals (current goals can now be found in the care plan section) Acute Rehab PT Goals Patient Stated Goal: to get stronger PT Goal Formulation: With patient Time For Goal Achievement: 11/18/22 Potential to Achieve Goals: Fair Progress towards PT goals: Progressing toward goals    Frequency    Min 2X/week      PT Plan Current plan remains appropriate    Co-evaluation              AM-PAC PT "6 Clicks" Mobility   Outcome Measure  Help needed turning from your back to your side while in a flat bed without using bedrails?: A Little Help needed moving from lying on your back to sitting on the side of a flat bed without using bedrails?: A Lot Help needed moving to and from a bed to a chair (including a wheelchair)?: A Little Help needed standing up from a chair using your arms (e.g., wheelchair or bedside chair)?: A Little Help needed to walk in hospital room?: A Little Help needed climbing 3-5 steps with a railing? : A Lot 6 Click Score: 16    End of Session   Activity Tolerance: Patient limited by fatigue Patient left: in bed;with call bell/phone within reach;with bed alarm set   PT Visit Diagnosis: Unsteadiness on feet (R26.81);Other abnormalities of gait and mobility (R26.89);Muscle weakness (generalized) (M62.81);Difficulty in walking, not elsewhere classified (R26.2)     Time: 1450-1500 PT Time Calculation (min) (ACUTE ONLY): 10 min  Charges:  $Gait Training: 8-22 mins                     Donna Bernard, PT, MPT    Ina Homes 11/07/2022, 3:24 PM

## 2022-11-07 NOTE — TOC Progression Note (Signed)
Transition of Care [facility]) - Progression Note    Patient Details  Name: SRI CLEGG MRN: [id number] Date of Birth: [birth year]  Transition of [facility]) CM/SW Contact  Darolyn Rua, [place] Phone Number: 11/07/2022, 1:43 PM  Clinical Narrative:     CSW spoke with patient to provide bed offers, patient chose [facility].   Insurance Berkley Harvey has been started, [name] at [facility] updated on choice.    Expected Discharge Plan: Skilled Nursing Facility    Expected Discharge Plan and Services     Post Acute Care Choice: Skilled Nursing Facility Living arrangements for the past 2 months: Apartment                                       Social Determinants of Health (SDOH) Interventions SDOH Screenings   Food Insecurity: No Food Insecurity (11/01/2022)  Housing: Low Risk  (11/01/2022)  Transportation Needs: No Transportation Needs (11/01/2022)  Utilities: Not At Risk (11/01/2022)  Depression (PHQ2-9): Low Risk  (12/23/2020)  Tobacco Use: High Risk (11/07/2022)    Readmission Risk Interventions     No data to display

## 2022-11-07 NOTE — Progress Notes (Signed)
[facility]  Daily Progress Note   Subjective  -   Doing much better.  More mentally clear.  Minimal pain.  No longer feels the pulsatile mass in her belly  Objective Vitals:   11/07/22 0300 11/07/22 0328 11/07/22 0928 11/07/22 1342  BP:  (!) 126/53 (!) 113/55 (!) 106/53  Pulse:  73 74 77  Resp:  16 19 19   Temp:  99.2 F (37.3 C) 98.4 F (36.9 C) 98.4 F (36.9 C)  TempSrc:  Oral Oral Oral  SpO2:  97% 97% 100%  Weight: 38.8 kg     Height:        Intake/Output Summary (Last 24 hours) at 11/07/2022 1443 Last data filed at 11/07/2022 0930 Gross per 24 hour  Intake 723 ml  Output 1025 ml  Net -302 ml    PULM  CTAB CV  RRR VASC  2+ left DP pulse with minimal lower extremity swelling  Laboratory CBC    Component Value Date/Time   WBC 7.9 11/07/2022 0612   HGB 8.6 (L) 11/07/2022 0612   HCT 26.4 (L) 11/07/2022 0612   PLT 212 11/07/2022 0612    BMET    Component Value Date/Time   NA 141 11/07/2022 0612   K 3.8 11/07/2022 0612   CL 110 11/07/2022 0612   CO2 24 11/07/2022 0612   GLUCOSE 97 11/07/2022 0612   BUN 10 11/07/2022 0612   CREATININE 0.43 (L) 11/07/2022 0612   CALCIUM 8.5 (L) 11/07/2022 0612   GFRNONAA >60 11/07/2022 0612   GFRAA >60 06/06/2020 0024    Assessment/Planning: POD # 6/4 s/p endovascular abdominal aortic aneurysm repair and subsequent left femoral endarterectomy for large abdominal aortic aneurysm as well as left lower extremity ischemia  Doing quite well.  Working better with therapy but needs nursing facility at discharge.  Paperwork is in process for this and expect she will be ready for discharge in the next 1 to 2 days to rehab Continue PT/OT and current medical regimen   8/4  11/07/2022, 2:43 PM

## 2022-11-07 NOTE — Care Management Important Message (Signed)
Important Message  Patient Details  Name: LENYX BOODY MRN: [id number] Date of Birth: 1946/07/27   Medicare Important Message Given:  Yes     Johnell Comings 11/07/2022, 12:14 PM

## 2022-11-08 LAB — BASIC METABOLIC PANEL
Anion gap: 4 — ABNORMAL LOW (ref 5–15)
BUN: 10 mg/dL (ref 8–23)
CO2: 25 mmol/L (ref 22–32)
Calcium: 8.8 mg/dL — ABNORMAL LOW (ref 8.9–10.3)
Chloride: 111 mmol/L (ref 98–111)
Creatinine, Ser: 0.42 mg/dL — ABNORMAL LOW (ref 0.44–1.00)
GFR, Estimated: >60 mL/min (ref >60)
Glucose, Bld: 92 mg/dL (ref 70–99)
Potassium: 3.8 mmol/L (ref 3.5–5.1)
Sodium: 140 mmol/L (ref 135–145)

## 2022-11-08 NOTE — Progress Notes (Signed)
Doing well. Working with PT Planning rehab at discharge and awaiting placement at this point.

## 2022-11-08 NOTE — Progress Notes (Signed)
Patient requested to have a laxative. Patient has been digitally disimpacting herself. Patient has had multiple hard stools while straining. New orders given by Dr. Wyn Quaker.

## 2022-11-08 NOTE — TOC Progression Note (Addendum)
Transition of Care [facility]) - Progression Note    Patient Details  Name: Autumn Le MRN: [id number] Date of Birth: 1946/07/07  Transition of Care [facility]) CM/SW Contact  Allena Katz, LCSW Phone Number: 11/08/2022, 1:59 PM  Clinical Narrative:  As of 1:30pm, auth still pending for [facility], SW will continue to follow.  2:53PM Insurance requesting additional PT notes, notes uploaded to [employer] [facility] portal.     Expected Discharge Plan: Skilled Nursing Facility    Expected Discharge Plan and Services     Post Acute Care Choice: Skilled Nursing Facility Living arrangements for the past 2 months: Apartment                                       Social Determinants of Health (SDOH) Interventions SDOH Screenings   Food Insecurity: No Food Insecurity (11/01/2022)  Housing: Low Risk  (11/01/2022)  Transportation Needs: No Transportation Needs (11/01/2022)  Utilities: Not At Risk (11/01/2022)  Depression (PHQ2-9): Low Risk  (12/23/2020)  Tobacco Use: High Risk (11/07/2022)    Readmission Risk Interventions     No data to display

## 2022-11-08 NOTE — Progress Notes (Signed)
[facility]  Daily Progress Note   Subjective  -   Doing well today. Mentally clear. Minimal pain. No longer feels the pulsatile mass in her belly. Ready to discharge to Rehab.   Objective Vitals:   11/07/22 2020 11/07/22 2328 11/08/22 0535 11/08/22 0850  BP: (!) 115/50 (!) 128/59 130/71 120/64  Pulse: 74 65 83 66  Resp: 19 19 18 18   Temp: 98.7 F (37.1 C) 98.8 F (37.1 C) 98.3 F (36.8 C) 98.3 F (36.8 C)  TempSrc: Oral Oral Oral Oral  SpO2: 99% 99% 99% 98%  Weight:      Height:        Intake/Output Summary (Last 24 hours) at 11/08/2022 1133 Last data filed at 11/07/2022 1535 Gross per 24 hour  Intake --  Output 300 ml  Net -300 ml    PULM  CTAB CV  RRR VASC  VASC  2+ left DP pulse with minimal lower extremity swelling   Laboratory CBC    Component Value Date/Time   WBC 7.9 11/07/2022 0612   HGB 8.6 (L) 11/07/2022 0612   HCT 26.4 (L) 11/07/2022 0612   PLT 212 11/07/2022 0612    BMET    Component Value Date/Time   NA 140 11/08/2022 0505   K 3.8 11/08/2022 0505   CL 111 11/08/2022 0505   CO2 25 11/08/2022 0505   GLUCOSE 92 11/08/2022 0505   BUN 10 11/08/2022 0505   CREATININE 0.42 (L) 11/08/2022 0505   CALCIUM 8.8 (L) 11/08/2022 0505   GFRNONAA >60 11/08/2022 0505   GFRAA >60 06/06/2020 0024    Assessment/Planning: POD #6/4 s/p endovascular abdominal aortic aneurysm repair and subsequent left femoral endarterectomy for large abdominal aortic aneurysm as well as left lower extremity ischemia   Doing quite well.  Working better with therapy but needs nursing facility at discharge.  Paperwork is in process for this and expect she will be ready for discharge in the next 1 to 2 days to rehab Continue PT/OT and current medical regimen   06/08/2020  11/08/2022, 11:33 AM

## 2022-11-09 LAB — BASIC METABOLIC PANEL
Anion gap: 7 (ref 5–15)
BUN: 10 mg/dL (ref 8–23)
CO2: 25 mmol/L (ref 22–32)
Calcium: 8.8 mg/dL — ABNORMAL LOW (ref 8.9–10.3)
Chloride: 109 mmol/L (ref 98–111)
Creatinine, Ser: 0.47 mg/dL (ref 0.44–1.00)
GFR, Estimated: >60 mL/min (ref >60)
Glucose, Bld: 92 mg/dL (ref 70–99)
Potassium: 3.7 mmol/L (ref 3.5–5.1)
Sodium: 141 mmol/L (ref 135–145)

## 2022-11-09 MED ORDER — POLYETHYLENE GLYCOL 3350 17 G PO PACK: 17.0000 g | PACK | Freq: Every day | ORAL | 0 refills | Status: DC

## 2022-11-09 MED ORDER — NICOTINE 21 MG/24HR TD PT24: 21.0000 mg | MEDICATED_PATCH | Freq: Every day | TRANSDERMAL | 0 refills | Status: DC

## 2022-11-09 MED ORDER — POLYETHYLENE GLYCOL 3350 17 G PO PACK
17.0000 g | PACK | Freq: Every day | ORAL | Status: DC
  Administered 2022-11-09: 17 g via ORAL
  Filled 2022-11-09: qty 1

## 2022-11-09 MED ORDER — ROSUVASTATIN CALCIUM 5 MG PO TABS: 5.0000 mg | ORAL_TABLET | Freq: Every day | ORAL | 1 refills | Status: DC

## 2022-11-09 MED ORDER — ACETAMINOPHEN 325 MG PO TABS: 325.0000 mg | ORAL_TABLET | ORAL | 0 refills | Status: AC | PRN

## 2022-11-09 MED ORDER — ASPIRIN 81 MG PO TBEC: 81.0000 mg | DELAYED_RELEASE_TABLET | Freq: Every day | ORAL | 12 refills | Status: DC

## 2022-11-09 NOTE — Discharge Summary (Signed)
[facility]    Discharge Summary    Patient ID:  Autumn Le MRN: [id number] DOB/AGE: 75-Jan-1947 75 y.o.  Admit date: 11/01/2022 Discharge date: 11/09/2022 Date of Surgery: 11/03/2022 Surgeon: Surgeon(s): Dew, Marlow Baars, MD  Admission Diagnosis: AAA (abdominal aortic aneurysm) without rupture [facility]) [I71.40]  Discharge Diagnoses:  AAA (abdominal aortic aneurysm) without rupture (HCC) [I71.40]  Secondary Diagnoses: Past Medical History:  Diagnosis Date   AAA (abdominal aortic aneurysm) (HCC)    a.) aortic US 02/05/2020: infrarenal AAA measuring 4.2 x 4.6 cm; b.) AAA duplex 08/12/2020: measured 4.3 cm; c.) AAA duplex 02/23/2021: measured 4.53 x 4.31 cm; d.) AAA doppler 03/07/2022: measured 4.53 x 4.31 cm; e.) LD chest CT 06/14/2022: measured 4.6 cm; f.) CT abd: partially thrombosed measuring up to 5.0 cm (overread by vascular surgery suggested size actually larger at 5.2-5.3 cm)   Anemia    Aneurysm of cavernous portion of left internal carotid artery 01/23/2020   a.) IR angio 01/23/2020: 8.0 x 5.6 mm wide necked aneurysm arising from the horizontal segment of the cavernous LEFT ICA   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   a.) IR angio 01/23/2020: 3.9 x 4.6 mm aneurysm at the bifurcation of the A2/A3 segment into the pericallosal and callosal marginal arteries; b.) s/p coli embolization 06/04/2020   Aortic atherosclerosis (HCC)    CAD (coronary artery disease)    Compression fracture of L1 lumbar vertebra (HCC)    COPD (chronic obstructive pulmonary disease) (HCC)    DDD (degenerative disc disease), cervical    Degenerative joint disease of cervical spine    Diverticulosis    Emphysema lung (HCC)    Eustachian tube dysfunction    GERD (gastroesophageal reflux disease)    HLD (hyperlipidemia)    HTN (hypertension)    Hypokalemia    Insomnia    Long term current use of antithrombotics/antiplatelets    a.) clopidogrel   Lung nodule     Meniere's disease in remission, bilateral    Osteopenia    PAF (paroxysmal atrial fibrillation) (HCC)    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   a.) IR angio 01/23/2020: 6.2 x 6.1 x 8.9 ruptured aneurysm arising from the origion of the RIGHT PCA projecting laterally --> coil embolization performed 01/23/2020   Subarachnoid 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 (HCC) 01/23/2020   a.) CT head 01/23/2020: extensive SAH about the RIGHT cerebral hemisphere and basal cisterns secondary to RIGHT PCA aneurysm rupture   Tobacco abuse    Unintentional weight loss    a.) reports eating well; eats 4-5 x/day and still feels hungry; b.) weight decreasing: 104 lb (02/2021), 88 lb (02/2022), 86 lb (08/2022), 83 lb (10/2022)   Vertigo     Procedure(s): ENDARTERECTOMY FEMORAL APPLICATION OF CELL SAVER  Discharged Condition: good  HPI:  POD #8/6 s/p endovascular abdominal aortic aneurysm repair and subsequent left femoral endarterectomy for large abdominal aortic aneurysm as well as left lower extremity ischemia.  Patient is doing quite well.  Working better with therapy but needs nursing facility at discharge.  Okay to discharge for further care today.  Continue PT/OT and current medical regimen.   Hospital Course:  [name] is a 76 y.o. female is S/P Right Extubated: POD # 0 Physical Exam:  Alert notes x3, no acute distress Face: Symmetrical.  Tongue is midline. Neck: Trachea is midline.  No swelling or bruising. Cardiovascular: Regular rate and rhythm Pulmonary: Clear to auscultation bilaterally Abdomen:  Soft, nontender, nondistended Right groin access: Clean dry and intact.  No swelling or drainage noted Left groin access: Clean dry and intact.  No swelling or drainage noted Left lower extremity: Thigh soft.  Calf soft.  Extremities warm distally toes.  Hard to palpate pedal pulses however the foot is warm is her good  capillary refill. Right lower extremity: Thigh soft.  Calf soft.  Extremities warm distally toes.  Hard to palpate pedal pulses however the foot is warm is her good capillary refill. Neurological: No deficits noted   Post-op wounds:  clean, dry, intact or healing well  Pt. Ambulating, voiding and taking PO diet without difficulty. Pt pain controlled with non narcotic PO pain meds.  Labs:  As below  Complications: none  Consults:    Significant Diagnostic Studies: CBC Lab Results  Component Value Date   WBC 7.9 11/07/2022   HGB 8.6 (L) 11/07/2022   HCT 26.4 (L) 11/07/2022   MCV 98.9 11/07/2022   PLT 212 11/07/2022    BMET    Component Value Date/Time   NA 141 11/09/2022 0615   K 3.7 11/09/2022 0615   CL 109 11/09/2022 0615   CO2 25 11/09/2022 0615   GLUCOSE 92 11/09/2022 0615   BUN 10 11/09/2022 0615   CREATININE 0.47 11/09/2022 0615   CALCIUM 8.8 (L) 11/09/2022 0615   GFRNONAA >60 11/09/2022 0615   GFRAA >60 06/06/2020 0024   COAG Lab Results  Component Value Date   INR 1.2 11/01/2022   INR 1.0 05/28/2020   INR 1.1 01/23/2020     Disposition:  Discharge to :Rehab   Verbal and written Discharge instructions given to the patient. Wound care per Discharge AVS  Follow-up Information     Dew, Marlow Baars, MD. Call in 3 week(s).   Specialties: Vascular Surgery, Radiology, Interventional Cardiology Why: Dr Wyn Quaker or APP Contact information: [address] [place] [address] [phone]                 Signed: Marcie Bal, NP  11/09/2022, 11:01 AM

## 2022-11-09 NOTE — Discharge Summary (Signed)
[facility] [place] [facility]    Discharge Summary    Patient ID:  Autumn Le MRN: [id number] DOB/AGE: 75-02-1946 75 y.o.  Admit date: 11/01/2022 Discharge date: 11/09/2022 Date of Surgery: 11/03/2022 Surgeon: Surgeon(s): Dew, Marlow Baars, MD  Admission Diagnosis: AAA (abdominal aortic aneurysm) without rupture [facility]) [I71.40]  Discharge Diagnoses:  AAA (abdominal aortic aneurysm) without rupture (HCC) [I71.40]  Secondary Diagnoses: Past Medical History:  Diagnosis Date   AAA (abdominal aortic aneurysm) (HCC)    a.) aortic US 02/05/2020: infrarenal AAA measuring 4.2 x 4.6 cm; b.) AAA duplex 08/12/2020: measured 4.3 cm; c.) AAA duplex 02/23/2021: measured 4.53 x 4.31 cm; d.) AAA doppler 03/07/2022: measured 4.53 x 4.31 cm; e.) LD chest CT 06/14/2022: measured 4.6 cm; f.) CT abd: partially thrombosed measuring up to 5.0 cm (overread by vascular surgery suggested size actually larger at 5.2-5.3 cm)   Anemia    Aneurysm of cavernous portion of left internal carotid artery 01/23/2020   a.) IR angio 01/23/2020: 8.0 x 5.6 mm wide necked aneurysm arising from the horizontal segment of the cavernous LEFT ICA   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   a.) IR angio 01/23/2020: 3.9 x 4.6 mm aneurysm at the bifurcation of the A2/A3 segment into the pericallosal and callosal marginal arteries; b.) s/p coli embolization 06/04/2020   Aortic atherosclerosis (HCC)    CAD (coronary artery disease)    Compression fracture of L1 lumbar vertebra (HCC)    COPD (chronic obstructive pulmonary disease) (HCC)    DDD (degenerative disc disease), cervical    Degenerative joint disease of cervical spine    Diverticulosis    Emphysema lung (HCC)    Eustachian tube dysfunction    GERD (gastroesophageal reflux disease)    HLD (hyperlipidemia)    HTN (hypertension)    Hypokalemia    Insomnia    Long term current use of antithrombotics/antiplatelets    a.) clopidogrel   Lung nodule     Meniere's disease in remission, bilateral    Osteopenia    PAF (paroxysmal atrial fibrillation) (HCC)    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   a.) IR angio 01/23/2020: 6.2 x 6.1 x 8.9 ruptured aneurysm arising from the origion of the RIGHT PCA projecting laterally --> coil embolization performed 01/23/2020   Subarachnoid 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 (HCC) 01/23/2020   a.) CT head 01/23/2020: extensive SAH about the RIGHT cerebral hemisphere and basal cisterns secondary to RIGHT PCA aneurysm rupture   Tobacco abuse    Unintentional weight loss    a.) reports eating well; eats 4-5 x/day and still feels hungry; b.) weight decreasing: 104 lb (02/2021), 88 lb (02/2022), 86 lb (08/2022), 83 lb (10/2022)   Vertigo     Procedure(s): ENDARTERECTOMY FEMORAL APPLICATION OF CELL SAVER  Discharged Condition: good  HPI:  POD #8/6 s/p endovascular abdominal aortic aneurysm repair and subsequent left femoral endarterectomy for large abdominal aortic aneurysm as well as left lower extremity ischemia.   Patient is doing quite well.  Working better with therapy but needs nursing facility at discharge.  Okay to discharge for further care today.  Continue PT/OT and current medical regimen.   Hospital Course:  Autumn Le is a 76 y.o. female is S/P Right Extubated: POD # 0 Physical Exam:  Alert notes x3, no acute distress Face: Symmetrical.  Tongue is midline. Neck: Trachea is midline.  No swelling or bruising. Cardiovascular: Regular rate and rhythm Pulmonary: Clear to auscultation bilaterally  Abdomen: Soft, nontender, nondistended Right groin access: Clean dry and intact.  No swelling or drainage noted Left groin access: Clean dry and intact.  No swelling or drainage noted Left lower extremity: Thigh soft.  Calf soft.  Extremities warm distally toes.  Hard to palpate pedal pulses however the foot is warm is her good  capillary refill. Right lower extremity: Thigh soft.  Calf soft.  Extremities warm distally toes.  Hard to palpate pedal pulses however the foot is warm is her good capillary refill. Neurological: No deficits noted   Post-op wounds:  clean, dry, intact or healing well  Pt. Ambulating, voiding and taking PO diet without difficulty. Pt pain controlled with PO pain meds.  Labs:  As below  Complications: none  Consults:    Significant Diagnostic Studies: CBC Lab Results  Component Value Date   WBC 7.9 11/07/2022   HGB 8.6 (L) 11/07/2022   HCT 26.4 (L) 11/07/2022   MCV 98.9 11/07/2022   PLT 212 11/07/2022    BMET    Component Value Date/Time   NA 141 11/09/2022 0615   K 3.7 11/09/2022 0615   CL 109 11/09/2022 0615   CO2 25 11/09/2022 0615   GLUCOSE 92 11/09/2022 0615   BUN 10 11/09/2022 0615   CREATININE 0.47 11/09/2022 0615   CALCIUM 8.8 (L) 11/09/2022 0615   GFRNONAA >60 11/09/2022 0615   GFRAA >60 06/06/2020 0024   COAG Lab Results  Component Value Date   INR 1.2 11/01/2022   INR 1.0 05/28/2020   INR 1.1 01/23/2020     Disposition:  Discharge to :Skilled nursing facility  Allergies as of 11/09/2022       Reactions   Latex Hives   bandaids   Nickel Other (See Comments)   blisters        Medication List     TAKE these medications    acetaminophen 325 MG tablet Commonly known as: TYLENOL Take 650 mg by mouth every 6 (six) hours as needed for headache (pain). What changed: Another medication with the same name was added. Make sure you understand how and when to take each.   acetaminophen 325 MG tablet Commonly known as: TYLENOL Take 1-2 tablets (325-650 mg total) by mouth every 4 (four) hours as needed for mild pain (or temp >/= 101 F). What changed: You were already taking a medication with the same name, and this prescription was added. Make sure you understand how and when to take each.   aspirin EC 81 MG tablet Take 1 tablet (81 mg  total) by mouth daily at 6 (six) AM. Swallow whole. Start taking on: November 10, 2022   Breo Ellipta 100-25 MCG/ACT Aepb Generic drug: fluticasone furoate-vilanterol Inhale 1 puff into the lungs every morning.   CALCIUM 600 PO Take 600 mg by mouth at bedtime.   Cholecalciferol 50 MCG (2000 UT) Caps Take 2,000 Units by mouth at bedtime.   clopidogrel 75 MG tablet Commonly known as: PLAVIX Take 75 mg by mouth daily.   ezetimibe 10 MG tablet Commonly known as: ZETIA Take 20 mg by mouth at bedtime.   fluticasone 50 MCG/ACT nasal spray Commonly known as: FLONASE Place 2 sprays into both nostrils as needed for allergies.   loratadine 10 MG tablet Commonly known as: CLARITIN Take 10 mg by mouth at bedtime.   nicotine 21 mg/24hr patch Commonly known as: NICODERM CQ - dosed in mg/24 hours Place 1 patch (21 mg total) onto the skin daily. Start taking on:  November 10, 2022   polyethylene glycol 17 g packet Commonly known as: MIRALAX / GLYCOLAX Take 17 g by mouth daily. Start taking on: November 10, 2022   Red Yeast Rice Extract 600 MG Caps Take by mouth.   rosuvastatin 5 MG tablet Commonly known as: CRESTOR Take 1 tablet (5 mg total) by mouth daily. Start taking on: November 10, 2022   STOOL SOFTENER PO Take 1-2 tablets by mouth daily.   vitamin C 1000 MG tablet Take 1,000 mg by mouth daily.   VITAMIN D3 COMPLETE PO Take 1 tablet by mouth daily at 6 (six) AM.       Verbal and written Discharge instructions given to the patient. Wound care per Discharge AVS  Follow-up Information     Dew, Marlow Baars, MD. Call in 3 week(s).   Specialties: Vascular Surgery, Radiology, Interventional Cardiology Why: Dr Wyn Quaker or APP Contact information: [address] [place] [address] [phone]                 Signed: Marcie Bal, NP  11/09/2022, 11:28 AM

## 2022-11-09 NOTE — Progress Notes (Signed)
Mobility Specialist - Progress Note    11/09/22 1114  Mobility  Activity Ambulated with assistance in hallway;Stood at bedside;Dangled on edge of bed  Level of Assistance Standby assist, set-up cues, supervision of patient - no hands on  Assistive Device Other (Comment) (HHA)  Distance Ambulated (ft) 200 ft  Activity Response Tolerated well  Mobility Referral Yes  $Mobility charge 1 Mobility    [employer] Mobility Specialist 11/09/22, 11:15 AM

## 2022-11-09 NOTE — TOC Transition Note (Signed)
Transition of Care [facility]) - CM/SW Discharge Note   Patient Details  Name: Autumn Le MRN: [id number] Date of Birth: 1946/04/09  Transition of Care [facility]) CM/SW Contact:  Allena Katz, LCSW Phone Number: 11/09/2022, 11:19 AM   Clinical Narrative:   [facility] obtained for [facility]. RN to contact family for transport. Facility notified, DC summary sent on the HUB. No needs currently CSW signing off.     Final next level of care: Skilled Nursing Facility Barriers to Discharge: Barriers Resolved   Patient Goals and CMS Choice CMS Medicare.gov Compare Post Acute Care list provided to:: Patient Choice offered to / list presented to : Patient  Discharge Placement                Patient chooses bed at: [facility] [place] Patient to be transferred to facility by: family      Discharge Plan and Services Additional resources added to the After Visit Summary for       Post Acute Care Choice: Skilled Nursing Facility                               Social Determinants of Health (SDOH) Interventions SDOH Screenings   Food Insecurity: No Food Insecurity (11/01/2022)  Housing: Low Risk  (11/01/2022)  Transportation Needs: No Transportation Needs (11/01/2022)  Utilities: Not At Risk (11/01/2022)  Depression (PHQ2-9): Low Risk  (12/23/2020)  Tobacco Use: High Risk (11/07/2022)     Readmission Risk Interventions     No data to display

## 2022-11-22 ENCOUNTER — Other Ambulatory Visit (INDEPENDENT_AMBULATORY_CARE_PROVIDER_SITE_OTHER): Payer: Self-pay | Admitting: Vascular Surgery

## 2022-11-22 DIAGNOSIS — I7143 Infrarenal abdominal aortic aneurysm, without rupture: Secondary | ICD-10-CM

## 2022-11-22 DIAGNOSIS — Z8679 Personal history of other diseases of the circulatory system: Secondary | ICD-10-CM

## 2022-11-23 ENCOUNTER — Telehealth (INDEPENDENT_AMBULATORY_CARE_PROVIDER_SITE_OTHER): Payer: Self-pay

## 2022-11-23 ENCOUNTER — Other Ambulatory Visit (INDEPENDENT_AMBULATORY_CARE_PROVIDER_SITE_OTHER): Payer: Self-pay | Admitting: Nurse Practitioner

## 2022-11-23 NOTE — Telephone Encounter (Signed)
Patient called complaining of left leg and foot pain.  She says she is taking tylenol but it is starting to upset her stomach.  She would like to know if there is something else she can take or be prescribed.

## 2022-11-24 ENCOUNTER — Other Ambulatory Visit (INDEPENDENT_AMBULATORY_CARE_PROVIDER_SITE_OTHER): Payer: Self-pay | Admitting: Nurse Practitioner

## 2022-11-24 MED ORDER — HYDROCODONE-ACETAMINOPHEN 5-325 MG PO TABS: 1.0000 | ORAL_TABLET | ORAL | 0 refills | Status: AC | PRN

## 2022-11-24 MED ORDER — HYDROCODONE-ACETAMINOPHEN 5-325 MG PO TABS: 1.0000 | ORAL_TABLET | ORAL | 0 refills | Status: DC | PRN

## 2022-11-24 NOTE — Telephone Encounter (Signed)
Informed patient of RX.  She ask it get sent to [facility].  I have called and canceled the rx at [facility].  Please send new RX to [facility].

## 2022-11-24 NOTE — Telephone Encounter (Signed)
Resent to [facility]

## 2022-11-24 NOTE — Telephone Encounter (Signed)
Sent in some hydrocodone

## 2022-11-28 ENCOUNTER — Ambulatory Visit (INDEPENDENT_AMBULATORY_CARE_PROVIDER_SITE_OTHER): Payer: 59

## 2022-11-28 ENCOUNTER — Ambulatory Visit (INDEPENDENT_AMBULATORY_CARE_PROVIDER_SITE_OTHER): Payer: 59 | Admitting: Vascular Surgery

## 2022-11-28 ENCOUNTER — Encounter (INDEPENDENT_AMBULATORY_CARE_PROVIDER_SITE_OTHER): Payer: Self-pay | Admitting: Vascular Surgery

## 2022-11-28 VITALS — BP 126/77 | HR 73 | Ht 61.0 in | Wt 83.0 lb

## 2022-11-28 DIAGNOSIS — Z8679 Personal history of other diseases of the circulatory system: Secondary | ICD-10-CM

## 2022-11-28 DIAGNOSIS — I7143 Infrarenal abdominal aortic aneurysm, without rupture: Secondary | ICD-10-CM

## 2022-11-28 DIAGNOSIS — I70219 Atherosclerosis of native arteries of extremities with intermittent claudication, unspecified extremity: Secondary | ICD-10-CM | POA: Insufficient documentation

## 2022-11-28 DIAGNOSIS — I10 Essential (primary) hypertension: Secondary | ICD-10-CM

## 2022-11-28 DIAGNOSIS — I70212 Atherosclerosis of native arteries of extremities with intermittent claudication, left leg: Secondary | ICD-10-CM

## 2022-11-28 DIAGNOSIS — Z9889 Other specified postprocedural states: Secondary | ICD-10-CM

## 2022-11-28 NOTE — Assessment & Plan Note (Signed)
An aneurysm duplex today shows slight decrease in the aortic sac size down to 5.0 cm with a patent stent graft without endoleak. Doing well. No changes. Recheck in one month.

## 2022-11-28 NOTE — Assessment & Plan Note (Signed)
blood pressure control important in reducing the progression of atherosclerotic disease and aneurysmal growth. On appropriate oral medications.

## 2022-11-28 NOTE — Progress Notes (Signed)
Patient ID: Autumn Le, female   DOB: 1946-10-15, 77 y.o.   MRN: [id number]  Chief Complaint  Patient presents with   Hospitalization Follow-up    AAA    HPI Autumn Le is a 77 y.o. female.  Patient returns in follow-up about 4 weeks after endovascular abdominal aortic aneurysm repair and subsequent left femoral endarterectomy and patch angioplasty later that week.  She is doing quite well.  She is home.  Her incisions are healed.  She did any major issues or problems.  She says her left leg is nearly a full strength at this point and she has occasional pain but much better.  No aneurysm related symptoms.  An aneurysm duplex today shows slight decrease in the aortic sac size down to 5.0 cm with a patent stent graft without endoleak.   Past Medical History:  Diagnosis Date   AAA (abdominal aortic aneurysm) ([place])    a.) aortic US 02/05/2020: infrarenal AAA measuring 4.2 x 4.6 cm; b.) AAA duplex 08/12/2020: measured 4.3 cm; c.) AAA duplex 02/23/2021: measured 4.53 x 4.31 cm; d.) AAA doppler 03/07/2022: measured 4.53 x 4.31 cm; e.) LD chest CT 06/14/2022: measured 4.6 cm; f.) CT abd: partially thrombosed measuring up to 5.0 cm (overread by vascular surgery suggested size actually larger at 5.2-5.3 cm)   Anemia    Aneurysm of cavernous portion of left internal carotid artery 01/23/2020   a.) IR angio 01/23/2020: 8.0 x 5.6 mm wide necked aneurysm arising from the horizontal segment of the cavernous LEFT ICA   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   a.) IR angio 01/23/2020: 3.9 x 4.6 mm aneurysm at the bifurcation of the A2/A3 segment into the pericallosal and callosal marginal arteries; b.) s/p coli embolization 06/04/2020   Aortic atherosclerosis (HCC)    CAD (coronary artery disease)    Compression fracture of L1 lumbar vertebra (HCC)    COPD (chronic obstructive pulmonary disease) (HCC)    DDD (degenerative disc disease), cervical    Degenerative joint disease of  cervical spine    Diverticulosis    Emphysema lung (HCC)    Eustachian tube dysfunction    GERD (gastroesophageal reflux disease)    HLD (hyperlipidemia)    HTN (hypertension)    Hypokalemia    Insomnia    Long term current use of antithrombotics/antiplatelets    a.) clopidogrel   Lung nodule    Meniere's disease in remission, bilateral    Osteopenia    PAF (paroxysmal atrial fibrillation) (HCC)    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 ([place]) 01/23/2020   a.) IR angio 01/23/2020: 6.2 x 6.1 x 8.9 ruptured aneurysm arising from the origion of the RIGHT PCA projecting laterally --> coil embolization performed 01/23/2020   Subarachnoid 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 ([place]) 01/23/2020   a.) CT head 01/23/2020: extensive SAH about the RIGHT cerebral hemisphere and basal cisterns secondary to RIGHT PCA aneurysm rupture   Tobacco abuse    Unintentional weight loss    a.) reports eating well; eats 4-5 x/day and still feels hungry; b.) weight decreasing: 104 lb (02/2021), 88 lb (02/2022), 86 lb (08/2022), 83 lb (10/2022)   Vertigo     Past Surgical History:  Procedure Laterality Date   BRAIN SURGERY  2021   CATARACT EXTRACTION W/ INTRAOCULAR LENS  IMPLANT, BILATERAL     ENDARTERECTOMY FEMORAL Left 11/03/2022   Procedure: ENDARTERECTOMY FEMORAL;  Surgeon: Algernon Huxley, MD;  Location: [facility];  Service:  Vascular;  Laterality: Left;   ENDOVASCULAR REPAIR/STENT GRAFT N/A 11/01/2022   Procedure: ENDOVASCULAR REPAIR/STENT GRAFT;  Surgeon: Algernon Huxley, MD;  Location: [place] [facility];  Service: Cardiovascular;  Laterality: N/A;   EYE SURGERY     IR ANGIO INTRA EXTRACRAN SEL INTERNAL CAROTID BILAT MOD SED  01/23/2020   IR ANGIO INTRA EXTRACRAN SEL INTERNAL CAROTID UNI R MOD SED  06/04/2020   IR ANGIO VERTEBRAL SEL VERTEBRAL UNI L MOD SED  01/23/2020   IR ANGIOGRAM FOLLOW UP STUDY  01/23/2020   IR ANGIOGRAM FOLLOW UP STUDY  01/23/2020   IR  ANGIOGRAM FOLLOW UP STUDY  01/23/2020   IR ANGIOGRAM FOLLOW UP STUDY  01/23/2020   IR ANGIOGRAM FOLLOW UP STUDY  06/04/2020   IR ANGIOGRAM FOLLOW UP STUDY  06/04/2020   IR NEURO EACH ADD'L AFTER BASIC UNI RIGHT (MS)  06/04/2020   IR TRANSCATH/EMBOLIZ  01/23/2020   IR TRANSCATH/EMBOLIZ  06/04/2020   IR US GUIDE VASC ACCESS RIGHT  06/04/2020   LOWER EXTREMITY ANGIOGRAPHY Left 11/02/2022   Procedure: Lower Extremity Angiography;  Surgeon: Algernon Huxley, MD;  Location: [place] [facility];  Service: Cardiovascular;  Laterality: Left;   MYRINGOTOMY WITH TUBE PLACEMENT Left 06/23/2021   Procedure: MYRINGOTOMY WITH TUBE PLACEMENT;  Surgeon: Margaretha Sheffield, MD;  Location: [place];  Service: ENT;  Laterality: Left;   RADIOLOGY WITH ANESTHESIA N/A 01/23/2020   Procedure: RADIOLOGY WITH ANESTHESIA;  Surgeon: Consuella Lose, MD;  Location: [place];  Service: Radiology;  Laterality: N/A;   RADIOLOGY WITH ANESTHESIA N/A 06/04/2020   Procedure: Coil embolization of left ACA aneurysm;  Surgeon: Consuella Lose, MD;  Location: [place];  Service: Radiology;  Laterality: N/A;   TONSILLECTOMY        Allergies  Allergen Reactions   Latex Hives    bandaids   Nickel Other (See Comments)    blisters    Current Outpatient Medications  Medication Sig Dispense Refill   acetaminophen (TYLENOL) 325 MG tablet Take 1-2 tablets (325-650 mg total) by mouth every 4 (four) hours as needed for mild pain (or temp >/= 101 F). 30 tablet 0   Ascorbic Acid (VITAMIN C) 1000 MG tablet Take 1,000 mg by mouth daily.     aspirin EC 81 MG tablet Take 1 tablet (81 mg total) by mouth daily at 6 (six) AM. Swallow whole. 30 tablet 12   Calcium Carbonate (CALCIUM 600 PO) Take 600 mg by mouth at bedtime.     Cholecalciferol 50 MCG (2000 UT) CAPS Take 2,000 Units by mouth at bedtime.      clopidogrel (PLAVIX) 75 MG tablet Take 75 mg by mouth daily.     Docusate Calcium (STOOL SOFTENER PO) Take 1-2 tablets by mouth  daily.     fluticasone (FLONASE) 50 MCG/ACT nasal spray Place 2 sprays into both nostrils as needed for allergies.     fluticasone furoate-vilanterol (BREO ELLIPTA) 100-25 MCG/INH AEPB Inhale 1 puff into the lungs every morning.     HYDROcodone-acetaminophen (NORCO/VICODIN) 5-325 MG tablet Take 1 tablet by mouth every 4 (four) hours as needed for moderate pain. 20 tablet 0   loratadine (CLARITIN) 10 MG tablet Take 10 mg by mouth at bedtime.     Multiple Vitamins-Minerals (VITAMIN D3 COMPLETE PO) Take 1 tablet by mouth daily at 6 (six) AM.     nicotine (NICODERM CQ - DOSED IN MG/24 HOURS) 21 mg/24hr patch Place 1 patch (21 mg total) onto the skin daily. 28 patch  0   polyethylene glycol (MIRALAX / GLYCOLAX) 17 g packet Take 17 g by mouth daily. 14 each 0   Red Yeast Rice Extract 600 MG CAPS Take by mouth.     rosuvastatin (CRESTOR) 5 MG tablet Take 1 tablet (5 mg total) by mouth daily. 30 tablet 1   ezetimibe (ZETIA) 10 MG tablet Take 20 mg by mouth at bedtime.     No current facility-administered medications for this visit.        Physical Exam BP 126/77   Pulse 73   Ht 5\' 1"  (1.549 m)   Wt 83 lb (37.6 kg)   BMI 15.68 kg/m  Gen:  WD/WN, NAD Skin: incision C/D/I     Assessment/Plan:  AAA (abdominal aortic aneurysm) without rupture (HCC) An aneurysm duplex today shows slight decrease in the aortic sac size down to 5.0 cm with a patent stent graft without endoleak. Doing well. No changes. Recheck in one month.  Atherosclerosis of native arteries of extremity with intermittent claudication (HCC) Had to undergo a left femoral endarterectomy for severe left common femoral disease after her aneurysm repair.  Left leg is doing well.  Incision is healed.  Continue to increase her activity and is doing physical therapy.  Recheck with ABIs in 3 months.  Essential hypertension blood pressure control important in reducing the progression of atherosclerotic disease and aneurysmal growth. On  appropriate oral medications.       11/28/2022, 12:10 PM   This note was created with Dragon medical transcription system.  Any errors from dictation are unintentional.

## 2022-11-28 NOTE — Assessment & Plan Note (Signed)
Had to undergo a left femoral endarterectomy for severe left common femoral disease after her aneurysm repair.  Left leg is doing well.  Incision is healed.  Continue to increase her activity and is doing physical therapy.  Recheck with ABIs in 3 months.

## 2022-12-01 ENCOUNTER — Ambulatory Visit (INDEPENDENT_AMBULATORY_CARE_PROVIDER_SITE_OTHER): Payer: Medicare Other | Admitting: Nurse Practitioner

## 2022-12-05 ENCOUNTER — Other Ambulatory Visit (INDEPENDENT_AMBULATORY_CARE_PROVIDER_SITE_OTHER): Payer: Self-pay | Admitting: Vascular Surgery

## 2023-02-27 ENCOUNTER — Ambulatory Visit (INDEPENDENT_AMBULATORY_CARE_PROVIDER_SITE_OTHER): Payer: 59 | Admitting: Vascular Surgery

## 2023-02-27 ENCOUNTER — Ambulatory Visit (INDEPENDENT_AMBULATORY_CARE_PROVIDER_SITE_OTHER): Payer: 59

## 2023-02-27 ENCOUNTER — Encounter (INDEPENDENT_AMBULATORY_CARE_PROVIDER_SITE_OTHER): Payer: Self-pay | Admitting: Vascular Surgery

## 2023-02-27 VITALS — BP 122/78 | HR 70 | Resp 15 | Wt 82.8 lb

## 2023-02-27 DIAGNOSIS — I70212 Atherosclerosis of native arteries of extremities with intermittent claudication, left leg: Secondary | ICD-10-CM | POA: Diagnosis not present

## 2023-02-27 DIAGNOSIS — E782 Mixed hyperlipidemia: Secondary | ICD-10-CM | POA: Diagnosis not present

## 2023-02-27 DIAGNOSIS — I10 Essential (primary) hypertension: Secondary | ICD-10-CM

## 2023-02-27 DIAGNOSIS — I7143 Infrarenal abdominal aortic aneurysm, without rupture: Secondary | ICD-10-CM

## 2023-02-27 NOTE — Progress Notes (Signed)
MRN : [id number]  Autumn Le is a 77 y.o. (1946/04/24) female who presents with chief complaint of  Chief Complaint  Patient presents with   Follow-up    Ultrasound follow up  .  History of Present Illness: Patient returns today in follow up of multiple vascular issues.  She is about 4 months status post endovascular abdominal aortic aneurysm repair as well as left femoral endarterectomy for severe peripheral arterial disease.  She is doing quite well.  She says her legs are feeling much better.  She is walking much better with only pain. No AAA stymptoms.  Specifically, the patient denies new back or abdominal pain, or signs of peripheral embolization. She has no new complaints today. Duplex today shows decrease in size of the aortic sac down to 4.9 cm in maximal diameter.  ABIs today are 0.79 on the right and 0.88 on the left with biphasic waveforms and digital pressures of 100 bilaterally.  Current Outpatient Medications  Medication Sig Dispense Refill   acetaminophen (TYLENOL) 325 MG tablet Take 1-2 tablets (325-650 mg total) by mouth every 4 (four) hours as needed for mild pain (or temp >/= 101 F). 30 tablet 0   Ascorbic Acid (VITAMIN C) 1000 MG tablet Take 1,000 mg by mouth daily.     aspirin EC 81 MG tablet Take 1 tablet (81 mg total) by mouth daily at 6 (six) AM. Swallow whole. 30 tablet 12   Calcium Carbonate (CALCIUM 600 PO) Take 600 mg by mouth at bedtime.     Cholecalciferol 50 MCG (2000 UT) CAPS Take 2,000 Units by mouth at bedtime.      clopidogrel (PLAVIX) 75 MG tablet Take 75 mg by mouth daily.     Docusate Calcium (STOOL SOFTENER PO) Take 1-2 tablets by mouth daily.     fluticasone (FLONASE) 50 MCG/ACT nasal spray Place 2 sprays into both nostrils as needed for allergies.     fluticasone furoate-vilanterol (BREO ELLIPTA) 100-25 MCG/INH AEPB Inhale 1 puff into the lungs every morning.     HYDROcodone-acetaminophen (NORCO/VICODIN) 5-325 MG tablet Take 1 tablet by mouth  every 4 (four) hours as needed for moderate pain. 20 tablet 0   loratadine (CLARITIN) 10 MG tablet Take 10 mg by mouth at bedtime.     Multiple Vitamins-Minerals (VITAMIN D3 COMPLETE PO) Take 1 tablet by mouth daily at 6 (six) AM.     nicotine (NICODERM CQ - DOSED IN MG/24 HOURS) 21 mg/24hr patch Place 1 patch (21 mg total) onto the skin daily. 28 patch 0   polyethylene glycol (MIRALAX / GLYCOLAX) 17 g packet Take 17 g by mouth daily. 14 each 0   Red Yeast Rice Extract 600 MG CAPS Take by mouth.     rosuvastatin (CRESTOR) 5 MG tablet TAKE 1 TABLET (5 MG TOTAL) BY MOUTH DAILY. 90 tablet 1   ezetimibe (ZETIA) 10 MG tablet Take 20 mg by mouth at bedtime.     No current facility-administered medications for this visit.    Past Medical History:  Diagnosis Date   AAA (abdominal aortic aneurysm)    a.) aortic US 02/05/2020: infrarenal AAA measuring 4.2 x 4.6 cm; b.) AAA duplex 08/12/2020: measured 4.3 cm; c.) AAA duplex 02/23/2021: measured 4.53 x 4.31 cm; d.) AAA doppler 03/07/2022: measured 4.53 x 4.31 cm; e.) LD chest CT 06/14/2022: measured 4.6 cm; f.) CT abd: partially thrombosed measuring up to 5.0 cm (overread by vascular surgery suggested size actually larger at 5.2-5.3 cm)  Anemia    Aneurysm of cavernous portion of left internal carotid artery 01/23/2020   a.) IR angio 01/23/2020: 8.0 x 5.6 mm wide necked aneurysm arising from the horizontal segment of the cavernous LEFT ICA   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   a.) IR angio 01/23/2020: 3.9 x 4.6 mm aneurysm at the bifurcation of the A2/A3 segment into the pericallosal and callosal marginal arteries; b.) s/p coli embolization 06/04/2020   Aortic atherosclerosis    CAD (coronary artery disease)    Compression fracture of L1 lumbar vertebra    COPD (chronic obstructive pulmonary disease)    DDD (degenerative disc disease), cervical    Degenerative joint disease of cervical spine    Diverticulosis    Emphysema lung     Eustachian tube dysfunction    GERD (gastroesophageal reflux disease)    HLD (hyperlipidemia)    HTN (hypertension)    Hypokalemia    Insomnia    Long term current use of antithrombotics/antiplatelets    a.) clopidogrel   Lung nodule    Meniere's disease in remission, bilateral    Osteopenia    PAF (paroxysmal atrial fibrillation)    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 01/23/2020   a.) IR angio 01/23/2020: 6.2 x 6.1 x 8.9 ruptured aneurysm arising from the origion of the RIGHT PCA projecting laterally --> coil embolization performed 01/23/2020   Subarachnoid 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 01/23/2020   a.) CT head 01/23/2020: extensive SAH about the RIGHT cerebral hemisphere and basal cisterns secondary to RIGHT PCA aneurysm rupture   Tobacco abuse    Unintentional weight loss    a.) reports eating well; eats 4-5 x/day and still feels hungry; b.) weight decreasing: 104 lb (02/2021), 88 lb (02/2022), 86 lb (08/2022), 83 lb (10/2022)   Vertigo     Past Surgical History:  Procedure Laterality Date   BRAIN SURGERY  2021   CATARACT EXTRACTION W/ INTRAOCULAR LENS  IMPLANT, BILATERAL     ENDARTERECTOMY FEMORAL Left 11/03/2022   Procedure: ENDARTERECTOMY FEMORAL;  Surgeon: Annice Needy, MD;  Location: [facility] ORS;  Service: Vascular;  Laterality: Left;   ENDOVASCULAR REPAIR/STENT GRAFT N/A 11/01/2022   Procedure: ENDOVASCULAR REPAIR/STENT GRAFT;  Surgeon: Annice Needy, MD;  Location: [facility];  Service: Cardiovascular;  Laterality: N/A;   EYE SURGERY     IR ANGIO INTRA EXTRACRAN SEL INTERNAL CAROTID BILAT MOD SED  01/23/2020   IR ANGIO INTRA EXTRACRAN SEL INTERNAL CAROTID UNI R MOD SED  06/04/2020   IR ANGIO VERTEBRAL SEL VERTEBRAL UNI L MOD SED  01/23/2020   IR ANGIOGRAM FOLLOW UP STUDY  01/23/2020   IR ANGIOGRAM FOLLOW UP STUDY  01/23/2020   IR ANGIOGRAM FOLLOW UP STUDY  01/23/2020   IR ANGIOGRAM FOLLOW UP STUDY   01/23/2020   IR ANGIOGRAM FOLLOW UP STUDY  06/04/2020   IR ANGIOGRAM FOLLOW UP STUDY  06/04/2020   IR NEURO EACH ADD'L AFTER BASIC UNI RIGHT (MS)  06/04/2020   IR TRANSCATH/EMBOLIZ  01/23/2020   IR TRANSCATH/EMBOLIZ  06/04/2020   IR US GUIDE VASC ACCESS RIGHT  06/04/2020   LOWER EXTREMITY ANGIOGRAPHY Left 11/02/2022   Procedure: Lower Extremity Angiography;  Surgeon: Annice Needy, MD;  Location: [facility];  Service: Cardiovascular;  Laterality: Left;   MYRINGOTOMY WITH TUBE PLACEMENT Left 06/23/2021   Procedure: MYRINGOTOMY WITH TUBE PLACEMENT;  Surgeon: Vernie Murders, MD;  Location: [facility];  Service: ENT;  Laterality: Left;  RADIOLOGY WITH ANESTHESIA N/A 01/23/2020   Procedure: RADIOLOGY WITH ANESTHESIA;  Surgeon: Lisbeth Renshaw, MD;  Location[facility] OR;  Service: Radiology;  Laterality: N/A;   RADIOLOGY WITH ANESTHESIA N/A 06/04/2020   Procedure: Coil embolization of left ACA aneurysm;  Surgeon: Lisbeth Renshaw, MD;  Location: [facility] OR;  Service: Radiology;  Laterality: N/A;   TONSILLECTOMY       Social History   Tobacco Use   Smoking status: Every Day    Packs/day: 0.50    Years: 55.00    Additional pack years: 0.00    Total pack years: 27.50    Types: Cigarettes   Smokeless tobacco: Never  Vaping Use   Vaping Use: Never used  Substance Use Topics   Alcohol use: Not Currently    Comment: occasional glass of wine    Drug use: Not Currently      Family History  Problem Relation Age of Onset   Heart disease Mother    Breast cancer Maternal Aunt   No bleeding or clotting disorders  Allergies  Allergen Reactions   Latex Hives    bandaids   Nickel Other (See Comments)    blisters     REVIEW OF SYSTEMS (Negative unless checked)  Constitutional: [] Weight loss  [] Fever  [] Chills Cardiac: [] Chest pain   [] Chest pressure   [] Palpitations   [] Shortness of breath when laying flat   [x] Shortness of breath at rest   [x] Shortness of breath with  exertion. Vascular:  [x] Pain in legs with walking   [] Pain in legs at rest   [] Pain in legs when laying flat   [] Claudication   [] Pain in feet when walking  [] Pain in feet at rest  [] Pain in feet when laying flat   [] History of DVT   [] Phlebitis   [] Swelling in legs   [] Varicose veins   [] Non-healing ulcers Pulmonary:   [] Uses home oxygen   [] Productive cough   [] Hemoptysis   [] Wheeze  [x] COPD   [] Asthma Neurologic:  [] Dizziness  [] Blackouts   [] Seizures   [] History of stroke   [] History of TIA  [] Aphasia   [] Temporary blindness   [] Dysphagia   [] Weakness or numbness in arms   [] Weakness or numbness in legs Musculoskeletal:  [x] Arthritis   [] Joint swelling   [] Joint pain   [] Low back pain Hematologic:  [] Easy bruising  [] Easy bleeding   [] Hypercoagulable state   [x] Anemic   Gastrointestinal:  [] Blood in stool   [] Vomiting blood  [] Gastroesophageal reflux/heartburn   [] Abdominal pain Genitourinary:  [] Chronic kidney disease   [] Difficult urination  [] Frequent urination  [] Burning with urination   [] Hematuria Skin:  [] Rashes   [] Ulcers   [] Wounds Psychological:  [] History of anxiety   []  History of major depression.  Physical Examination  BP 122/78 (BP Location: Right Arm)   Pulse 70   Resp 15   Wt 82 lb 12.8 oz (37.6 kg)   BMI 15.64 kg/m  Gen:  WD/WN, NAD Head: [place]/AT, No temporalis wasting. Ear/Nose/Throat: Hearing grossly intact, nares w/o erythema or drainage Eyes: Conjunctiva clear. Sclera non-icteric Neck: Supple.  Trachea midline Pulmonary:  Good air movement, no use of accessory muscles.  Cardiac: RRR, no JVD Vascular:  Vessel Right Left  Radial Palpable Palpable                          PT 1+ Palpable 1+ Palpable  DP 1+ Palpable 2+ Palpable   Gastrointestinal: soft, non-tender/non-distended. No guarding/reflex.  Musculoskeletal: M/S 5/5  throughout.  No deformity or atrophy. No edema. Neurologic: Sensation grossly intact in extremities.  Symmetrical.  Speech is fluent.   Psychiatric: Judgment intact, Mood & affect appropriate for pt's clinical situation. Dermatologic: No rashes or ulcers noted.  No cellulitis or open wounds.      Labs No results found for this or any previous visit (from the past 2160 hour(s)).  Radiology No results found.  Assessment/Plan  AAA (abdominal aortic aneurysm) without rupture (HCC) Duplex today shows decrease in size of the aortic sac down to 4.9 cm in maximal diameter.  She is about 4 months status post endovascular repair.  Doing well.  Recheck in 6 months with duplex.  Atherosclerosis of native arteries of extremity with intermittent claudication (HCC) ABIs today are 0.79 on the right and 0.88 on the left with biphasic waveforms and digital pressures of 100 bilaterally.  Doing much better after left femoral endarterectomy.  No current symptoms.  Continue current medical regimen.  Recheck in 6 months.  Hyperlipidemia, mixed lipid control important in reducing the progression of atherosclerotic disease. Continue statin therapy   Essential hypertension blood pressure control important in reducing the progression of atherosclerotic disease. On appropriate oral medications.     [name], MD  02/27/2023 11:29 AM    This note was created with Dragon medical transcription system.  Any errors from dictation are purely unintentional

## 2023-02-27 NOTE — Assessment & Plan Note (Signed)
lipid control important in reducing the progression of atherosclerotic disease. Continue statin therapy  

## 2023-02-27 NOTE — Assessment & Plan Note (Signed)
Duplex today shows decrease in size of the aortic sac down to 4.9 cm in maximal diameter.  She is about 4 months status post endovascular repair.  Doing well.  Recheck in 6 months with duplex.

## 2023-02-27 NOTE — Assessment & Plan Note (Signed)
ABIs today are 0.79 on the right and 0.88 on the left with biphasic waveforms and digital pressures of 100 bilaterally.  Doing much better after left femoral endarterectomy.  No current symptoms.  Continue current medical regimen.  Recheck in 6 months.

## 2023-02-27 NOTE — Assessment & Plan Note (Signed)
blood pressure control important in reducing the progression of atherosclerotic disease. On appropriate oral medications.  

## 2023-02-28 LAB — VAS US ABI WITH/WO TBI
Left ABI: 0.88
Right ABI: 0.79

## 2023-04-19 ENCOUNTER — Other Ambulatory Visit: Payer: Self-pay | Admitting: Acute Care

## 2023-04-19 DIAGNOSIS — Z122 Encounter for screening for malignant neoplasm of respiratory organs: Secondary | ICD-10-CM

## 2023-04-19 DIAGNOSIS — Z87891 Personal history of nicotine dependence: Secondary | ICD-10-CM

## 2023-04-19 DIAGNOSIS — F17210 Nicotine dependence, cigarettes, uncomplicated: Secondary | ICD-10-CM

## 2023-05-02 ENCOUNTER — Other Ambulatory Visit: Payer: Self-pay | Admitting: Family Medicine

## 2023-05-02 DIAGNOSIS — Z1231 Encounter for screening mammogram for malignant neoplasm of breast: Secondary | ICD-10-CM

## 2023-05-31 ENCOUNTER — Ambulatory Visit
Admission: RE | Admit: 2023-05-31 | Discharge: 2023-05-31 | Disposition: A | Discharge status: Home / Self Care | Payer: 59 | Source: Ambulatory Visit | Attending: Family Medicine | Admitting: Family Medicine

## 2023-05-31 DIAGNOSIS — Z1231 Encounter for screening mammogram for malignant neoplasm of breast: Secondary | ICD-10-CM | POA: Insufficient documentation

## 2023-06-06 ENCOUNTER — Other Ambulatory Visit (INDEPENDENT_AMBULATORY_CARE_PROVIDER_SITE_OTHER): Payer: Self-pay | Admitting: Vascular Surgery

## 2023-06-18 ENCOUNTER — Ambulatory Visit: Admission: RE | Admit: 2023-06-18 | Payer: 59 | Source: Ambulatory Visit

## 2023-06-19 ENCOUNTER — Ambulatory Visit
Admission: RE | Admit: 2023-06-19 | Discharge: 2023-06-19 | Disposition: A | Discharge status: Home / Self Care | Payer: 59 | Source: Ambulatory Visit | Attending: Acute Care | Admitting: Acute Care

## 2023-06-19 DIAGNOSIS — F17210 Nicotine dependence, cigarettes, uncomplicated: Secondary | ICD-10-CM | POA: Diagnosis present

## 2023-06-19 DIAGNOSIS — Z87891 Personal history of nicotine dependence: Secondary | ICD-10-CM | POA: Insufficient documentation

## 2023-06-19 DIAGNOSIS — Z122 Encounter for screening for malignant neoplasm of respiratory organs: Secondary | ICD-10-CM | POA: Diagnosis present

## 2023-06-25 ENCOUNTER — Telehealth: Payer: Self-pay | Admitting: Acute Care

## 2023-06-25 DIAGNOSIS — R911 Solitary pulmonary nodule: Secondary | ICD-10-CM

## 2023-06-25 DIAGNOSIS — Z87891 Personal history of nicotine dependence: Secondary | ICD-10-CM

## 2023-06-25 NOTE — Telephone Encounter (Signed)
Left VM for patient to call for review of LDCT results.  RADS3 and also note possible aspiration.

## 2023-06-25 NOTE — Telephone Encounter (Signed)
Pt returned call and left VM . Attempted to contact pt but had to leave another message for pt to call back.

## 2023-06-29 NOTE — Telephone Encounter (Signed)
Called and left VM for pt to review results.

## 2023-07-17 NOTE — Telephone Encounter (Signed)
Spoke with pt and advised that there is a new small lung nodule noted on this scan and we would like to get a repeat Chest CT in 6 months . PT verbalized understanding and is aware we will contact her closer to 6 months to scheduled. CT results faxed to PCP. Order placed for 6 month nodule f/u CT.

## 2023-07-17 NOTE — Addendum Note (Signed)
Addended by: Abigail Miyamoto D on: 07/17/2023 01:30 PM   Modules accepted: Orders

## 2023-08-02 ENCOUNTER — Ambulatory Visit: Payer: Self-pay | Admitting: General Surgery

## 2023-08-02 NOTE — H&P (View-Only) (Signed)
PATIENT PROFILE: Autumn Le is a 77 y.o. female who presents to the Clinic for consultation at the request of Autumn Le for evaluation of epidermal inclusion cyst.   PCP:  Autumn Ivan, MD   HISTORY OF PRESENT ILLNESS: Autumn Le reports having a cyst on the posterior neck.  She has had the cyst for years.  She endorses that the cyst is growing.  This is getting more uncomfortable.  Patient cannot identify any alleviating factors.  Aggravating factor is applying pressure.  Denies any fever or chills.  Denies any drainage.  She had a similar cyst on the left posterior auricular area that drained spontaneously.  She denies any previous infection.  She denies any previous excision on the same area.     PROBLEM LIST: Problem List  Date Reviewed: 06/12/2023            Noted    Pulmonary nodule seen on imaging study (CT chest 06/19/23; 4.57mm RML; repeat 6 months) 07/24/2023    Low body weight due to inadequate caloric intake 10/13/2022    Aortic atherosclerosis (CT chest - 06/14/22) 06/30/2022    AAA (abdominal aortic aneurysm) without rupture (~5cm) - followed by Autumn Le 06/30/2022    Primary insomnia 11/11/2021    Tobacco dependence (1/2ppd) 08/10/2020    Medicare annual wellness visit, subsequent 12/12/22 07/14/2020    History of ruptured arterial aneurysm (01/2020) - followed by Neurosurgery ([place]) 02/25/2020    Hyperlipidemia, mixed (LDL 58 - 06/05/23) 07/09/2019    Medicare annual wellness visit, initial 07/09/19 07/09/2019    DNR (do not resuscitate) 07/09/2019    Osteopenia of lumbar spine (Dexa 12/28/17 - repeat 2 yrs) 01/08/2019    Other emphysema (CMS/HHS-HCC) 02/14/2016    Overview      Mild to moderate reduction in the DLCO. Mild obstructive ventilatory defect by spiorometery        Degenerative cervical disc 11/30/2015      GENERAL REVIEW OF SYSTEMS:    General ROS: negative for - chills, fatigue, fever, weight gain or weight loss Allergy and Immunology ROS: negative for - hives   Hematological and Lymphatic ROS: negative for - bleeding problems or bruising, negative for palpable nodes Endocrine ROS: negative for - heat or cold intolerance, hair changes Respiratory ROS: negative for - cough, shortness of breath or wheezing Cardiovascular ROS: no chest pain or palpitations GI ROS: negative for nausea, vomiting, abdominal pain, diarrhea, constipation Musculoskeletal ROS: negative for - joint swelling or muscle pain Neurological ROS: negative for - confusion, syncope Dermatological ROS: negative for pruritus and rash Psychiatric: negative for anxiety, depression, difficulty sleeping and memory loss   MEDICATIONS: Current Medications        Current Outpatient Medications  Medication Sig Dispense Refill   acetaminophen (TYLENOL) 325 MG tablet Take 650 mg by mouth every 6 (six) hours as needed for Pain          albuterol 90 mcg/actuation inhaler Inhale 2 inhalations into the lungs every 6 (six) hours as needed          aspirin 81 MG EC tablet Take by mouth       CALCIUM CITRATE ORAL Take 1 tablet by mouth once daily 600 mg         cholecalciferol (VITAMIN D3) 2,000 unit capsule Take 2,000 Units by mouth once daily          clopidogreL (PLAVIX) 75 mg tablet Take 1 tablet (75 mg total) by mouth once daily 90 tablet 1  fluticasone furoate-vilanteroL (BREO ELLIPTA) 100-25 mcg/dose DsDv inhaler Inhale 1 Puff into the lungs once daily 180 each 1   fluticasone propionate (FLONASE) 50 mcg/actuation nasal spray Place 2 sprays into both nostrils once daily as needed       HYDROcodone-acetaminophen (NORCO) 5-325 mg tablet Take by mouth       loratadine (CLARITIN) 10 mg tablet Take 1 tablet (10 mg total) by mouth once daily (Patient taking differently: Take 10 mg by mouth once daily as needed for Allergies) 90 tablet 1   rosuvastatin (CRESTOR) 5 MG tablet Take 1 tablet (5 mg total) by mouth once daily 90 tablet 1    No current facility-administered medications for this visit.         ALLERGIES: Latex and Nickel   PAST MEDICAL HISTORY: Past Medical History      Past Medical History:  Diagnosis Date   AAA (abdominal aortic aneurysm) (CMS-HCC)     Atherosclerosis of native arteries of extremity with intermittent claudication (CMS-HCC) 11/28/2022    Had to undergo a left femoral endarterectomy for severe left common femoral disease after her aneurysm repair. Left leg is doing well. Incision is healed. Continue to increase her activity and is doing physical therapy. Recheck with ABIs in 3 months.   Degenerative cervical disc 11/30/2015   Emphysema of lung (CMS/HHS-HCC)     Hyperlipidemia     Hypertension     Lung nodule 02/14/2016    CT scan 10/2015 Left Lung apex Fu in 12 months with CT scan   Meniere's disease of both ears 06/23/2016    Seeing Autumn Le   Osteopenia     Other emphysema (CMS/HHS-HCC) 02/14/2016    Mild to moderate reduction in the DLCO. Mild obstructive ventilatory defect by spiorometery   Ruptured cerebral aneurysm (CMS/HHS-HCC) 01/31/2020   Tobacco abuse 11/30/2015    1 ppd for 50 years.        PAST SURGICAL HISTORY: Past Surgical History       Past Surgical History:  Procedure Laterality Date   BRAIN SURGERY     11/14/2019 - 11/12/2020          ENDARTERECTOMY FEMORAL12/22/2023Leg Upper/Left       ENDOVASCULAR REPAIR/STENT GRAFT12/20/2023N/A       LOWER EXTREMITY ANGIOGRAPHY12/21/2023Left       MYRINGOTOMY WITH TUBE PLACEMENT8/11/2022Ear/Left       TONSILLECTOMY            FAMILY HISTORY: Family History        Family History  Problem Relation Name Age of Onset   Coronary Artery Disease (Blocked arteries around heart) Mother       Coronary Artery Disease (Blocked arteries around heart) Father       Dementia Brother            SOCIAL HISTORY: Social History  Social History         Socioeconomic History   Marital status: Single  Occupational History   Occupation: Retired   Tobacco Use   Smoking status: Every Day       Types: Cigarettes      Passive exposure: Current   Smokeless tobacco: Never  Vaping Use   Vaping status: Never Used  Substance and Sexual Activity   Alcohol use: Yes      Alcohol/week: 1.0 standard drink of alcohol      Types: 1 Glasses of wine per week   Drug use: Never  Social History Narrative    Religious Affiliation: [employer] [facility])  Social Determinants of Health        Food Insecurity: No Food Insecurity (11/01/2022)    Received from [facility] [place] [place]    Hunger Vital Sign     Worried About Running Out of Food in the Last Year: Never true     Ran Out of Food in the Last Year: Never true  Transportation Needs: No Transportation Needs (11/01/2022)    Received from [facility] [place]    [facility] - Transportation     Lack of Transportation (Medical): No     Lack of Transportation (Non-Medical): No        PHYSICAL EXAM:    Vitals:    07/31/23 0930  BP: 134/70  Pulse: 67    Body mass index is 15.49 kg/m. Weight: (!) 37.2 kg (82 lb)    GENERAL: Alert, active, oriented x3   HEENT: Pupils equal reactive to light. Extraocular movements are intact. Sclera clear. Palpebral conjunctiva normal red color.Pharynx clear.   NECK: Supple with 3 cm soft tissue mass on the back of the neck   LUNGS: Sound clear with no rales rhonchi or wheezes.   HEART: Regular rhythm S1 and S2 without murmur.   ABDOMEN: Soft and depressible, nontender with no palpable mass, no hepatomegaly.    EXTREMITIES: Well-developed well-nourished symmetrical with no dependent edema.   NEUROLOGICAL: Awake alert oriented, facial expression symmetrical, moving all extremities.   REVIEW OF DATA: I have reviewed the following data today:      Appointment on 06/05/2023  Component Date Value   WBC (White Blood Cell Co* 06/05/2023 7.6    RBC (Red Blood Cell Coun* 06/05/2023 4.05    Hemoglobin 06/05/2023 13.4    Hematocrit 06/05/2023 40.0    MCV (Mean Corpuscular Vo* 06/05/2023  98.8    MCH (Mean Corpuscular He* 06/05/2023 33.1 (H)    MCHC (Mean Corpuscular H* 06/05/2023 33.5    Platelet Count 06/05/2023 218    RDW-CV (Red Cell Distrib* 06/05/2023 13.0    MPV (Mean Platelet Volum* 06/05/2023 8.7 (L)    Neutrophils 06/05/2023 5.12    Lymphocytes 06/05/2023 1.63    Monocytes 06/05/2023 0.54    Eosinophils 06/05/2023 0.24    Basophils 06/05/2023 0.06    Neutrophil % 06/05/2023 67.4    Lymphocyte % 06/05/2023 21.4    Monocyte % 06/05/2023 7.1    Eosinophil % 06/05/2023 3.2    Basophil% 06/05/2023 0.8    Immature Granulocyte % 06/05/2023 0.1    Immature Granulocyte Cou* 06/05/2023 0.01    Glucose 06/05/2023 93    Sodium 06/05/2023 140    Potassium 06/05/2023 4.4    Chloride 06/05/2023 104    Carbon Dioxide (CO2) 06/05/2023 30.8    Urea Nitrogen (BUN) 06/05/2023 16    Creatinine 06/05/2023 0.6    Glomerular Filtration Ra* 06/05/2023 93    Calcium 06/05/2023 9.8    AST  06/05/2023 16    ALT  06/05/2023 7    Alk Phos (alkaline Phosp* 06/05/2023 105 (H)    Albumin 06/05/2023 4.2    Bilirubin, Total 06/05/2023 0.4    Protein, Total 06/05/2023 6.9    A/G Ratio 06/05/2023 1.6    Cholesterol, Total 06/05/2023 138    Triglyceride 06/05/2023 69    HDL (High Density Lipopr* 16/08/9603 66.3    LDL Calculated 06/05/2023 58    VLDL Cholesterol 06/05/2023 14    Cholesterol/HDL Ratio 06/05/2023 2.1       ASSESSMENT: Ms. Satchell is a 77 y.o. female  presenting for consultation for epidermal inclusion cyst. The patient has a mass on the posterior neck that is causing some pain and discomfort on pressure. Patient oriented about the diagnosis of epidermal inclusion cyst, not a malignant lesion but if it is causing symptoms, excision can be considered. Patient oriented about the procedure, benefits and risk.    Due to her history of easy bleeding, Plavix and aspirin we will proceed with excision of neck cyst in the operation room.   Epidermal inclusion cyst [L72.0]    PLAN: Excision of neck epidermoid cyst (09811) We discuss about recommendation of quit smoking to decrease the risk of infection and improve healing of the incision Contact us if you have any concern   Patient verbalized understanding, all questions were answered, and were agreeable with the plan outlined above.    Carolan Shiver, MD   Electronically signed by Carolan Shiver, MD

## 2023-08-02 NOTE — H&P (Signed)
PATIENT PROFILE: Autumn Le is a 77 y.o. female who presents to the Clinic for consultation at the request of Dr. Mel Almond for evaluation of epidermal inclusion cyst.   PCP:  Marisue Ivan, MD   HISTORY OF PRESENT ILLNESS: Autumn Le reports having a cyst on the posterior neck.  She has had the cyst for years.  She endorses that the cyst is growing.  This is getting more uncomfortable.  Patient cannot identify any alleviating factors.  Aggravating factor is applying pressure.  Denies any fever or chills.  Denies any drainage.  She had a similar cyst on the left posterior auricular area that drained spontaneously.  She denies any previous infection.  She denies any previous excision on the same area.     PROBLEM LIST: Problem List  Date Reviewed: 06/12/2023            Noted    Pulmonary nodule seen on imaging study (CT chest 06/19/23; 4.57mm RML; repeat 6 months) 07/24/2023    Low body weight due to inadequate caloric intake 10/13/2022    Aortic atherosclerosis (CT chest - 06/14/22) 06/30/2022    AAA (abdominal aortic aneurysm) without rupture (~5cm) - followed by Dr. Wyn Quaker 06/30/2022    Primary insomnia 11/11/2021    Tobacco dependence (1/2ppd) 08/10/2020    Medicare annual wellness visit, subsequent 12/12/22 07/14/2020    History of ruptured arterial aneurysm (01/2020) - followed by Neurosurgery ([place]) 02/25/2020    Hyperlipidemia, mixed (LDL 58 - 06/05/23) 07/09/2019    Medicare annual wellness visit, initial 07/09/19 07/09/2019    DNR (do not resuscitate) 07/09/2019    Osteopenia of lumbar spine (Dexa 12/28/17 - repeat 2 yrs) 01/08/2019    Other emphysema (CMS/HHS-HCC) 02/14/2016    Overview      Mild to moderate reduction in the DLCO. Mild obstructive ventilatory defect by spiorometery        Degenerative cervical disc 11/30/2015      GENERAL REVIEW OF SYSTEMS:    General ROS: negative for - chills, fatigue, fever, weight gain or weight loss Allergy and Immunology ROS: negative for - hives   Hematological and Lymphatic ROS: negative for - bleeding problems or bruising, negative for palpable nodes Endocrine ROS: negative for - heat or cold intolerance, hair changes Respiratory ROS: negative for - cough, shortness of breath or wheezing Cardiovascular ROS: no chest pain or palpitations GI ROS: negative for nausea, vomiting, abdominal pain, diarrhea, constipation Musculoskeletal ROS: negative for - joint swelling or muscle pain Neurological ROS: negative for - confusion, syncope Dermatological ROS: negative for pruritus and rash Psychiatric: negative for anxiety, depression, difficulty sleeping and memory loss   MEDICATIONS: Current Medications        Current Outpatient Medications  Medication Sig Dispense Refill   acetaminophen (TYLENOL) 325 MG tablet Take 650 mg by mouth every 6 (six) hours as needed for Pain          albuterol 90 mcg/actuation inhaler Inhale 2 inhalations into the lungs every 6 (six) hours as needed          aspirin 81 MG EC tablet Take by mouth       CALCIUM CITRATE ORAL Take 1 tablet by mouth once daily 600 mg         cholecalciferol (VITAMIN D3) 2,000 unit capsule Take 2,000 Units by mouth once daily          clopidogreL (PLAVIX) 75 mg tablet Take 1 tablet (75 mg total) by mouth once daily 90 tablet 1  fluticasone furoate-vilanteroL (BREO ELLIPTA) 100-25 mcg/dose DsDv inhaler Inhale 1 Puff into the lungs once daily 180 each 1   fluticasone propionate (FLONASE) 50 mcg/actuation nasal spray Place 2 sprays into both nostrils once daily as needed       HYDROcodone-acetaminophen (NORCO) 5-325 mg tablet Take by mouth       loratadine (CLARITIN) 10 mg tablet Take 1 tablet (10 mg total) by mouth once daily (Patient taking differently: Take 10 mg by mouth once daily as needed for Allergies) 90 tablet 1   rosuvastatin (CRESTOR) 5 MG tablet Take 1 tablet (5 mg total) by mouth once daily 90 tablet 1    No current facility-administered medications for this visit.         ALLERGIES: Latex and Nickel   PAST MEDICAL HISTORY: Past Medical History      Past Medical History:  Diagnosis Date   AAA (abdominal aortic aneurysm) (CMS-HCC)     Atherosclerosis of native arteries of extremity with intermittent claudication (CMS-HCC) 11/28/2022    Had to undergo a left femoral endarterectomy for severe left common femoral disease after her aneurysm repair. Left leg is doing well. Incision is healed. Continue to increase her activity and is doing physical therapy. Recheck with ABIs in 3 months.   Degenerative cervical disc 11/30/2015   Emphysema of lung (CMS/HHS-HCC)     Hyperlipidemia     Hypertension     Lung nodule 02/14/2016    CT scan 10/2015 Left Lung apex Fu in 12 months with CT scan   Meniere's disease of both ears 06/23/2016    Seeing Dr. Dyane Dustman   Osteopenia     Other emphysema (CMS/HHS-HCC) 02/14/2016    Mild to moderate reduction in the DLCO. Mild obstructive ventilatory defect by spiorometery   Ruptured cerebral aneurysm (CMS/HHS-HCC) 01/31/2020   Tobacco abuse 11/30/2015    1 ppd for 50 years.        PAST SURGICAL HISTORY: Past Surgical History       Past Surgical History:  Procedure Laterality Date   BRAIN SURGERY     11/14/2019 - 11/12/2020          ENDARTERECTOMY FEMORAL12/22/2023Leg Upper/Left       ENDOVASCULAR REPAIR/STENT GRAFT12/20/2023N/A       LOWER EXTREMITY ANGIOGRAPHY12/21/2023Left       MYRINGOTOMY WITH TUBE PLACEMENT8/11/2022Ear/Left       TONSILLECTOMY            FAMILY HISTORY: Family History        Family History  Problem Relation Name Age of Onset   Coronary Artery Disease (Blocked arteries around heart) Mother       Coronary Artery Disease (Blocked arteries around heart) Father       Dementia Brother            SOCIAL HISTORY: Social History  Social History         Socioeconomic History   Marital status: Single  Occupational History   Occupation: Retired   Tobacco Use   Smoking status: Every Day       Types: Cigarettes      Passive exposure: Current   Smokeless tobacco: Never  Vaping Use   Vaping status: Never Used  Substance and Sexual Activity   Alcohol use: Yes      Alcohol/week: 1.0 standard drink of alcohol      Types: 1 Glasses of wine per week   Drug use: Never  Social History Narrative    Religious Affiliation: [employer] [facility])  Social Determinants of Health        Food Insecurity: No Food Insecurity (11/01/2022)    Received from [facility] [place] [place]    Hunger Vital Sign     Worried About Running Out of Food in the Last Year: Never true     Ran Out of Food in the Last Year: Never true  Transportation Needs: No Transportation Needs (11/01/2022)    Received from [facility] [place]    [facility] - Transportation     Lack of Transportation (Medical): No     Lack of Transportation (Non-Medical): No        PHYSICAL EXAM:    Vitals:    07/31/23 0930  BP: 134/70  Pulse: 67    Body mass index is 15.49 kg/m. Weight: (!) 37.2 kg (82 lb)    GENERAL: Alert, active, oriented x3   HEENT: Pupils equal reactive to light. Extraocular movements are intact. Sclera clear. Palpebral conjunctiva normal red color.Pharynx clear.   NECK: Supple with 3 cm soft tissue mass on the back of the neck   LUNGS: Sound clear with no rales rhonchi or wheezes.   HEART: Regular rhythm S1 and S2 without murmur.   ABDOMEN: Soft and depressible, nontender with no palpable mass, no hepatomegaly.    EXTREMITIES: Well-developed well-nourished symmetrical with no dependent edema.   NEUROLOGICAL: Awake alert oriented, facial expression symmetrical, moving all extremities.   REVIEW OF DATA: I have reviewed the following data today:      Appointment on 06/05/2023  Component Date Value   WBC (White Blood Cell Co* 06/05/2023 7.6    RBC (Red Blood Cell Coun* 06/05/2023 4.05    Hemoglobin 06/05/2023 13.4    Hematocrit 06/05/2023 40.0    MCV (Mean Corpuscular Vo* 06/05/2023  98.8    MCH (Mean Corpuscular He* 06/05/2023 33.1 (H)    MCHC (Mean Corpuscular H* 06/05/2023 33.5    Platelet Count 06/05/2023 218    RDW-CV (Red Cell Distrib* 06/05/2023 13.0    MPV (Mean Platelet Volum* 06/05/2023 8.7 (L)    Neutrophils 06/05/2023 5.12    Lymphocytes 06/05/2023 1.63    Monocytes 06/05/2023 0.54    Eosinophils 06/05/2023 0.24    Basophils 06/05/2023 0.06    Neutrophil % 06/05/2023 67.4    Lymphocyte % 06/05/2023 21.4    Monocyte % 06/05/2023 7.1    Eosinophil % 06/05/2023 3.2    Basophil% 06/05/2023 0.8    Immature Granulocyte % 06/05/2023 0.1    Immature Granulocyte Cou* 06/05/2023 0.01    Glucose 06/05/2023 93    Sodium 06/05/2023 140    Potassium 06/05/2023 4.4    Chloride 06/05/2023 104    Carbon Dioxide (CO2) 06/05/2023 30.8    Urea Nitrogen (BUN) 06/05/2023 16    Creatinine 06/05/2023 0.6    Glomerular Filtration Ra* 06/05/2023 93    Calcium 06/05/2023 9.8    AST  06/05/2023 16    ALT  06/05/2023 7    Alk Phos (alkaline Phosp* 06/05/2023 105 (H)    Albumin 06/05/2023 4.2    Bilirubin, Total 06/05/2023 0.4    Protein, Total 06/05/2023 6.9    A/G Ratio 06/05/2023 1.6    Cholesterol, Total 06/05/2023 138    Triglyceride 06/05/2023 69    HDL (High Density Lipopr* 16/08/9603 66.3    LDL Calculated 06/05/2023 58    VLDL Cholesterol 06/05/2023 14    Cholesterol/HDL Ratio 06/05/2023 2.1       ASSESSMENT: Autumn Le is a 77 y.o. female  presenting for consultation for epidermal inclusion cyst. The patient has a mass on the posterior neck that is causing some pain and discomfort on pressure. Patient oriented about the diagnosis of epidermal inclusion cyst, not a malignant lesion but if it is causing symptoms, excision can be considered. Patient oriented about the procedure, benefits and risk.    Due to her history of easy bleeding, Plavix and aspirin we will proceed with excision of neck cyst in the operation room.   Epidermal inclusion cyst [L72.0]    PLAN: Excision of neck epidermoid cyst (09811) We discuss about recommendation of quit smoking to decrease the risk of infection and improve healing of the incision Contact us if you have any concern   Patient verbalized understanding, all questions were answered, and were agreeable with the plan outlined above.    Carolan Shiver, MD   Electronically signed by Carolan Shiver, MD

## 2023-08-03 ENCOUNTER — Encounter
Admission: RE | Admit: 2023-08-03 | Discharge: 2023-08-03 | Disposition: A | Discharge status: Home / Self Care | Payer: 59 | Source: Ambulatory Visit | Attending: General Surgery

## 2023-08-03 ENCOUNTER — Other Ambulatory Visit: Payer: Self-pay

## 2023-08-03 DIAGNOSIS — I10 Essential (primary) hypertension: Secondary | ICD-10-CM

## 2023-08-03 HISTORY — DX: Epidermal cyst: L72.0

## 2023-08-03 NOTE — Patient Instructions (Addendum)
Your procedure is scheduled on: 08/10/23 - Friday Report to the Registration Desk on the 1st floor of the [facility]. To find out your arrival time, please call [phone] between 1PM - 3PM on: 08/09/23 - Thursday If your arrival time is 6:00 am, do not arrive before that time as the [facility] entrance doors do not open until 6:00 am.  REMEMBER: Instructions that are not followed completely may result in serious medical risk, up to and including death; or upon the discretion of your surgeon and anesthesiologist your surgery may need to be rescheduled.  Do not eat food or drink any fluids after midnight the night before surgery.  No gum chewing or hard candies.   One week prior to surgery: Stop Anti-inflammatories (NSAIDS) such as Advil, Aleve, Ibuprofen, Motrin, Naproxen, Naprosyn and Aspirin based products such as Excedrin, Goody's Powder, BC Powder. You may however, continue to take Tylenol if needed for pain up until the day of surgery.  Stop ANY OVER THE COUNTER supplements until after surgery.    TAKE ONLY THESE MEDICATIONS THE MORNING OF SURGERY WITH A SIP OF WATER:  BREO ELLIPTA    No Alcohol for 24 hours before or after surgery.  No Smoking including e-cigarettes for 24 hours before surgery.  No chewable tobacco products for at least 6 hours before surgery.  No nicotine patches on the day of surgery.  Do not use any "recreational" drugs for at least a week (preferably 2 weeks) before your surgery.  Please be advised that the combination of cocaine and anesthesia may have negative outcomes, up to and including death. If you test positive for cocaine, your surgery will be cancelled.  On the morning of surgery brush your teeth with toothpaste and water, you may rinse your mouth with mouthwash if you wish. Do not swallow any toothpaste or mouthwash.  Use CHG Soap or wipes as directed on instruction sheet.  Do not wear jewelry, make-up, hairpins, clips or nail  polish.  For welded (permanent) jewelry: bracelets, anklets, waist bands, etc.  Please have this removed prior to surgery.  If it is not removed, there is a chance that hospital personnel will need to cut it off on the day of surgery.  Do not wear lotions, powders, or perfumes.   Do not shave body hair from the neck down 48 hours before surgery.  Contact lenses, hearing aids and dentures may not be worn into surgery.  Do not bring valuables to the hospital. [facility] is not responsible for any missing/lost belongings or valuables.   Notify your doctor if there is any change in your medical condition (cold, fever, infection).  Wear comfortable clothing (specific to your surgery type) to the hospital.  After surgery, you can help prevent lung complications by doing breathing exercises.  Take deep breaths and cough every 1-2 hours. Your doctor may order a device called an Incentive Spirometer to help you take deep breaths. When coughing or sneezing, hold a pillow firmly against your incision with both hands. This is called "splinting." Doing this helps protect your incision. It also decreases belly discomfort.  If you are being admitted to the hospital overnight, leave your suitcase in the car. After surgery it may be brought to your room.  In case of increased patient census, it may be necessary for you, the patient, to continue your postoperative care in the Same Day Surgery department.  If you are being discharged the day of surgery, you will not be allowed  to drive home. You will need a responsible individual to drive you home and stay with you for 24 hours after surgery.   If you are taking public transportation, you will need to have a responsible individual with you.  Please call the Pre-admissions Testing Dept. at [phone] if you have any questions about these instructions.  Surgery Visitation Policy:  Patients having surgery or a procedure may have two visitors.   Children under the age of 56 must have an adult with them who is not the patient.  Inpatient Visitation:    Visiting hours are 7 a.m. to 8 p.m. Up to four visitors are allowed at one time in a patient room. The visitors may rotate out with other people during the day.  One visitor age 67 or older may stay with the patient overnight and must be in the room by 8 p.m.

## 2023-08-06 ENCOUNTER — Encounter
Admission: RE | Admit: 2023-08-06 | Discharge: 2023-08-06 | Disposition: A | Discharge status: Home / Self Care | Payer: 59 | Source: Ambulatory Visit | Attending: General Surgery | Admitting: General Surgery

## 2023-08-06 ENCOUNTER — Encounter: Payer: Self-pay | Admitting: General Surgery

## 2023-08-06 DIAGNOSIS — I10 Essential (primary) hypertension: Secondary | ICD-10-CM | POA: Diagnosis not present

## 2023-08-06 DIAGNOSIS — Z01818 Encounter for other preprocedural examination: Secondary | ICD-10-CM | POA: Diagnosis present

## 2023-08-06 DIAGNOSIS — R9431 Abnormal electrocardiogram [ECG] [EKG]: Secondary | ICD-10-CM | POA: Insufficient documentation

## 2023-08-06 DIAGNOSIS — Z01810 Encounter for preprocedural cardiovascular examination: Secondary | ICD-10-CM | POA: Diagnosis not present

## 2023-08-06 NOTE — Progress Notes (Signed)
Perioperative / Anesthesia Services  Pre-Admission Testing Clinical Review / Pre-Operative Anesthesia Consult  Date: 08/06/23  Patient Demographics:  Name: Autumn Le DOB:   1946-08-09 MRN:   [id number]  Planned Surgical Procedure(s):    Case: [id number] Date/Time: 08/10/23 0715   Procedure: CYST REMOVAL (Neck)   Anesthesia type: Monitor Anesthesia Care   Pre-op diagnosis: L72.0 epidermial inclusion cyst   Location: [facility] OR ROOM 03 / [facility]   Surgeons: Carolan Shiver, MD     NOTE: Available PAT nursing documentation and vital signs have been reviewed. Clinical nursing staff has updated patient's PMH/PSHx, current medication list, and drug allergies/intolerances to ensure comprehensive history available to assist in medical decision making as it pertains to the aforementioned surgical procedure and anticipated anesthetic course. Extensive review of available clinical information personally performed. Clarinda PMH and PSHx updated with any diagnoses/procedures that  may have been inadvertently omitted during her intake with the pre-admission testing department's nursing staff.  Clinical Discussion:  Autumn Le is a 77 y.o. female who is submitted for pre-surgical anesthesia review and clearance prior to her undergoing the above procedure. Patient is a Current Smoker (27.5 pack years). Pertinent PMH includes: CAD, PAF, cerebral aneurysms (s/p coil embolization), infrarenal AAA (s/p EVAR), LEFT ICA aneurysm, aortic atherosclerosis, PAD (s/p LEFT femoral endarterectomy), HTN, HLD, COPD, pulmonary nodules, GERD (no daily Tx), anemia, cervical DDD/DJD, L1 compression fracture, agitation, insomnia.   Patient is followed by cardiology Juliann Pares, MD). She was last seen in the cardiology clinic on 11/21/2022; notes reviewed. At the time of her clinic visit, patient doing well overall from a cardiovascular perspective.  Patient with chronic dyspnea related to her  chronic COPD diagnosis.  Respiratory status reported to be stable and at baseline.  Patient denied any chest pain, shortness of breath, PND, orthopnea, palpitations, significant peripheral edema, weakness, fatigue, vertiginous symptoms, or presyncope/syncope. Patient with a past medical history significant for cardiovascular diagnoses. Documented physical exam was grossly benign, providing no evidence of acute exacerbation and/or decompensation of the patient's known cardiovascular conditions.  Patient with a history significant for cerebral aneurysms in the past. She suffered an extensive subarachnoid bleed on 01/23/2020. IR angiography study found that SAH was secondary to a ruptured RIGHT posterior communicating artery aneurysm. Coil emobolization was performed.   3.9 x 4.6 mm anterior communicating artery aneurysm was also noted at the bifurcation of the A2/A3 segments extending into the pericallosal and callosal marginal arteries. Given the rupture of the previously mentioned defect, treatment of this intact defect was delayed allowing for patient stabilization. This area was ultimately treated, with coil embolization, on 06/04/2020  8.0 x 5.6 mm wide necked aneurysm was also noted arising from the horizontal segment of the cavernous LEFT ICA.   Myocardial perfusion imaging study performed on 10/31/2022 revealed a normal left ventricular systolic function with an EF of 57%.  There was no evidence of stress-induced myocardial ischemia or arrhythmia; no scintigraphic evidence of scar.  Study interpreted be normal and low risk.  Most recent TTE was performed on 10/31/2022 revealing normal left ventricular systolic function with an EF >55%.  There were no regional wall motion abnormalities.  Right ventricular size and function normal.  There was trivial to mild mitral, tricuspid, and pulmonary valve regurgitation.  All transvalvular gradients were noted be normal providing no evidence suggestive of  valvular stenosis.  Patient with known infrarenal AAA that was noted on aortic doppler study back in 01/2020. At that time, aneurysmal defect  measured 4.2 x 4.6 cm. Area of concern serially monitored since discovery, until defect grew to 5.2-5.3 cm on CT imaging in 09/2022. Patient ultimately underwent endovascular stent graft repair (EVAR) on 11/01/2022.  Following her EVAR repair, patient found to have significant LEFT lower extremity peripheral artery disease.  She underwent PTA of the LEFT common and superficial femoral arteries on 11/02/2022.  The following day, patient underwent LEFT femoral endarterectomy.  Given known PAD and history of recent AAA repair, patient remains on daily DAPT therapy using ASA + clopidogrel.  Patient reportedly compliant with therapy with no evidence reports of GI/GU related bleeding.  Blood pressure well-controlled at 110/60 mmHg without the use of pharmacological intervention.  She remains on daily rosuvastatin + ezetimibe for her HLD diagnosis and further ASCVD prevention. Discussed consideration of adding a PCSK9i.  Patient is not diabetic. Patient does not have an OSAH diagnosis.  Functional capacity somewhat limited by age and lower extremity claudication.  With that said, patient still able to complete all of her ADLs/IADLs independently without significant cardiovascular limitation.  Per the DASI, patient able to complete at least 4 METS of physical activity without experiencing any significant degree of angina/anginal equivalent symptoms.  No changes were made to her medication regimen.  Patient to follow-up with outpatient cardiology in 1 year or sooner if needed.  Autumn Le is scheduled for an elective CYST REMOVAL (Neck) on 08/10/2023 with Dr. Carolan Shiver, MD.  Given patient's past medical history significant for cardiovascular diagnoses, presurgical cardiac clearance was sought by the PAT team. Per cardiology, "this patient is optimized for  surgery and may proceed with the planned procedural course with a ACCEPTABLE risk of significant perioperative cardiovascular complications".    Again, this patient is on daily DAPT therapy using ASA + clopidogrel.  Reached out to primary attending surgeon to discuss.  Per surgery, patient may continue both of her DAPT medications throughout her perioperative course; patient has been made aware.  Patient denies previous perioperative complications with anesthesia in the past. In review of the available records, it is noted that patient underwent a general anesthetic course here at [facility] in 10/2022 without documented complications.      02/27/2023   10:30 AM 11/28/2022   11:36 AM 11/09/2022   12:17 PM  Vitals with BMI  Height  5\' 1"    Weight 82 lbs 13 oz 83 lbs   BMI  15.69   Systolic 122 126 865  Diastolic 78 77 58  Pulse 70 73 76    Providers/Specialists:   NOTE: Primary physician provider listed below. Patient may have been seen by APP or partner within same practice.   PROVIDER ROLE / SPECIALTY LAST Beverely Pace, MD General Surgery (Surgeon) 07/31/2023  Marisue Ivan, MD Primary Care Provider 07/20/2023  Rudean Hitt, MD Cardiology 11/21/2022  [name], MD Vascular Surgery 02/27/2023   Allergies:  Latex and Nickel  Current Home Medications:   No current facility-administered medications for this encounter.    acetaminophen (TYLENOL) 325 MG tablet   Ascorbic Acid (VITAMIN C) 1000 MG tablet   aspirin EC 81 MG tablet   Calcium Carbonate (CALCIUM 600 PO)   Cholecalciferol 50 MCG (2000 UT) CAPS   clopidogrel (PLAVIX) 75 MG tablet   Docusate Calcium (STOOL SOFTENER PO)   ezetimibe (ZETIA) 10 MG tablet   fluticasone (FLONASE) 50 MCG/ACT nasal spray   fluticasone furoate-vilanterol (BREO ELLIPTA) 100-25 MCG/INH AEPB   HYDROcodone-acetaminophen (NORCO/VICODIN)  5-325 MG tablet   loratadine (CLARITIN) 10 MG  tablet   Multiple Vitamins-Minerals (VITAMIN D3 COMPLETE PO)   rosuvastatin (CRESTOR) 5 MG tablet   History:   Past Medical History:  Diagnosis Date   AAA (abdominal aortic aneurysm) (HCC)    a.) aortic US 02/05/2020: infrarenal AAA measuring 4.2 x 4.6 cm; b.) AAA duplex 08/12/2020: measured 4.3 cm; c.) AAA duplex 02/23/2021: measured 4.53 x 4.31 cm; d.) AAA doppler 03/07/2022: measured 4.53 x 4.31 cm; e.) LD chest CT 06/14/2022: measured 4.6 cm; f.) CT abd: partially thrombosed measuring up to 5.0 cm (overread by vascular suggested larger at 5.2-5.3 cm); g.) s/p EVAR 11/01/2022   Agitation    Anemia    Aneurysm of cavernous portion of left internal carotid artery 01/23/2020   a.) IR angio 01/23/2020: 8.0 x 5.6 mm wide necked aneurysm arising from the horizontal segment of the cavernous LEFT ICA   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   a.) IR angio 01/23/2020: 3.9 x 4.6 mm aneurysm at the bifurcation of the A2/A3 segment into the pericallosal and callosal marginal arteries; b.) s/p coli embolization 06/04/2020   Aortic atherosclerosis (HCC)    CAD (coronary artery disease)    Compression fracture of L1 lumbar vertebra (HCC)    COPD (chronic obstructive pulmonary disease) (HCC)    DDD (degenerative disc disease), cervical    Degenerative joint disease of cervical spine    Diverticulosis    Emphysema lung (HCC)    Epidermoid cyst of neck    Eustachian tube dysfunction    GERD (gastroesophageal reflux disease)    HLD (hyperlipidemia)    HTN (hypertension)    Hypokalemia    Insomnia    Long-term use of aspirin therapy    Lung nodule    Meniere's disease in remission, bilateral    On long term clopidogrel therapy    Osteopenia    PAD (peripheral artery disease) (HCC)    a.) s/p PTA fo LEFT CFA and SFA 11/02/2022; b.) s/p LEFT femoral endarterectomy 11/03/2022   PAF (paroxysmal atrial fibrillation) (HCC)    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)  01/23/2020   a.) IR angio 01/23/2020: 6.2 x 6.1 x 8.9 ruptured aneurysm arising from the origion of the RIGHT PCA projecting laterally --> coil embolization performed 01/23/2020   Subarachnoid 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 (HCC) 01/23/2020   a.) CT head 01/23/2020: extensive SAH about the RIGHT cerebral hemisphere and basal cisterns secondary to RIGHT PCA aneurysm rupture   Tobacco abuse    Unintentional weight loss    a.) reports eating well; eats 4-5 x/day and still feels hungry; b.) weight decreasing: 104 lb (02/2021), 88 lb (02/2022), 86 lb (08/2022), 83 lb (10/2022), 82 lb (07/2023)   Vertigo    Past Surgical History:  Procedure Laterality Date   ABDOMINAL AORTIC ANEURYSM REPAIR  10/2022   BRAIN SURGERY  2021   CATARACT EXTRACTION W/ INTRAOCULAR LENS  IMPLANT, BILATERAL     ENDARTERECTOMY FEMORAL Left 11/03/2022   Procedure: ENDARTERECTOMY FEMORAL;  Surgeon: Annice Needy, MD;  Location: [facility] ORS;  Service: Vascular;  Laterality: Left;   endocardectomy femerol  10/2022   ENDOVASCULAR REPAIR/STENT GRAFT N/A 11/01/2022   Procedure: ENDOVASCULAR REPAIR/STENT GRAFT;  Surgeon: Annice Needy, MD;  Location: [facility] INVASIVE CV LAB;  Service: Cardiovascular;  Laterality: N/A;   ENDOVASCULAR REPAIR/STENT GRAFT     EYE SURGERY     IR ANGIO INTRA EXTRACRAN SEL INTERNAL CAROTID BILAT MOD  SED  01/23/2020   IR ANGIO INTRA EXTRACRAN SEL INTERNAL CAROTID UNI R MOD SED  06/04/2020   IR ANGIO VERTEBRAL SEL VERTEBRAL UNI L MOD SED  01/23/2020   IR ANGIOGRAM FOLLOW UP STUDY  01/23/2020   IR ANGIOGRAM FOLLOW UP STUDY  01/23/2020   IR ANGIOGRAM FOLLOW UP STUDY  01/23/2020   IR ANGIOGRAM FOLLOW UP STUDY  01/23/2020   IR ANGIOGRAM FOLLOW UP STUDY  06/04/2020   IR ANGIOGRAM FOLLOW UP STUDY  06/04/2020   IR NEURO EACH ADD'L AFTER BASIC UNI RIGHT (MS)  06/04/2020   IR TRANSCATH/EMBOLIZ  01/23/2020   IR TRANSCATH/EMBOLIZ  06/04/2020   IR US GUIDE VASC ACCESS RIGHT  06/04/2020    LOWER EXTREMITY ANGIOGRAPHY Left 11/02/2022   Procedure: Lower Extremity Angiography;  Surgeon: Annice Needy, MD;  Location: [facility] INVASIVE CV LAB;  Service: Cardiovascular;  Laterality: Left;   MYRINGOTOMY WITH TUBE PLACEMENT Left 06/23/2021   Procedure: MYRINGOTOMY WITH TUBE PLACEMENT;  Surgeon: Vernie Murders, MD;  Location: [facility];  Service: ENT;  Laterality: Left;   RADIOLOGY WITH ANESTHESIA N/A 01/23/2020   Procedure: RADIOLOGY WITH ANESTHESIA;  Surgeon: Lisbeth Renshaw, MD;  Location: [facility] OR;  Service: Radiology;  Laterality: N/A;   RADIOLOGY WITH ANESTHESIA N/A 06/04/2020   Procedure: Coil embolization of left ACA aneurysm;  Surgeon: Lisbeth Renshaw, MD;  Location: [facility] OR;  Service: Radiology;  Laterality: N/A;   TONSILLECTOMY     Family History  Problem Relation Age of Onset   Heart disease Mother    Breast cancer Maternal Aunt    Social History   Tobacco Use   Smoking status: Every Day    Current packs/day: 0.50    Average packs/day: 0.5 packs/day for 55.0 years (27.5 ttl pk-yrs)    Types: Cigarettes   Smokeless tobacco: Never  Vaping Use   Vaping status: Never Used  Substance Use Topics   Alcohol use: Not Currently    Comment: occasional glass of wine    Drug use: Not Currently    Pertinent Clinical Results:  LABS:    Component Ref Range & Units 06/05/2023  WBC (White Blood Cell Count) 4.1 - 10.2 10^3/uL 7.6  RBC (Red Blood Cell Count) 4.04 - 5.48 10^6/uL 4.05  Hemoglobin 12.0 - 15.0 gm/dL 52.8  Hematocrit 41.3 - 47.0 % 40.0  MCV (Mean Corpuscular Volume) 80.0 - 100.0 fl 98.8  MCH (Mean Corpuscular Hemoglobin) 27.0 - 31.2 pg 33.1 High   MCHC (Mean Corpuscular Hemoglobin Concentration) 32.0 - 36.0 gm/dL 24.4  Platelet Count 010 - 450 10^3/uL 218  RDW-CV (Red Cell Distribution Width) 11.6 - 14.8 % 13.0  MPV (Mean Platelet Volume) 9.4 - 12.4 fl 8.7 Low   Neutrophils 1.50 - 7.80 10^3/uL 5.12  Lymphocytes 1.00 - 3.60 10^3/uL 1.63   Monocytes 0.00 - 1.50 10^3/uL 0.54  Eosinophils 0.00 - 0.55 10^3/uL 0.24  Basophils 0.00 - 0.09 10^3/uL 0.06  Neutrophil % 32.0 - 70.0 % 67.4  Lymphocyte % 10.0 - 50.0 % 21.4  Monocyte % 4.0 - 13.0 % 7.1  Eosinophil % 1.0 - 5.0 % 3.2  Basophil% 0.0 - 2.0 % 0.8  Immature Granulocyte % <=0.7 % 0.1  Immature Granulocyte Count <=0.06 10^3/L 0.01  Resulting Agency [facility]  Specimen Collected: 06/05/23 10:09   Performed by: [facility] Last Resulted: 06/05/23 14:18  Received From: [facility]  Result Received: 06/06/23 09:03   Component Ref Range & Units 06/05/2023  Glucose 70 - 110 mg/dL 93  Sodium 161 - 096 mmol/L 140  Potassium 3.6 - 5.1 mmol/L 4.4  Chloride 97 - 109 mmol/L 104  Carbon Dioxide (CO2) 22.0 - 32.0 mmol/L 30.8  Urea Nitrogen (BUN) 7 - 25 mg/dL 16  Creatinine 0.6 - 1.1 mg/dL 0.6  Glomerular Filtration Rate (eGFR) >60 mL/min/1.73sq m 93  Calcium 8.7 - 10.3 mg/dL 9.8  AST 8 - 39 U/L 16  ALT 5 - 38 U/L 7  Alk Phos (alkaline Phosphatase) 34 - 104 U/L 105 High   Albumin 3.5 - 4.8 g/dL 4.2  Bilirubin, Total 0.3 - 1.2 mg/dL 0.4  Protein, Total 6.1 - 7.9 g/dL 6.9  A/G Ratio 1.0 - 5.0 gm/dL 1.6  Resulting Agency [facility]  Specimen Collected: 06/05/23 10:09   Performed by: [facility] Last Resulted: 06/05/23 15:12  Received From: [facility]  Result Received: 06/06/23 09:03     ECG: Date: 08/06/2023 Time ECG obtained: 1147 AM Rate: 59 bpm Rhythm: normal sinus Axis (leads I and aVF): Normal Intervals: PR 134 ms. QRS 79 ms. QTc 407 ms. ST segment and T wave changes: No evidence of acute ST segment elevation or depression.  Evidence of a possible age undetermined septal infarct present. Comparison: Similar to previous tracing obtained on 11/01/2022   IMAGING / PROCEDURES: CT CHEST LUNG CA SCREEN LOW DOSE W/O CM performed on  06/19/2023 Lung-RADS 3, probably benign findings. Short-term follow-up in 6 months is recommended with repeat low-dose chest CT without contrast (please use the following order, CT CHEST LCS NODULE FOLLOW-UP W/O CM). Mild tracheal debris, consistent with aspiration. Coronary artery calcifications Aortic atherosclerosis  Emphysema  VAS [place] ABI WITH/WO TBI performed on 02/27/2023 Resting right ankle-brachial index indicates moderate right lower extremity arterial disease. The right toe-brachial index is abnormal.  Resting left ankle-brachial index indicates mild left lower extremity arterial disease. The left toe-brachial index is normal.   VAS [place] EVAR DUPLEX performed on 02/27/2023 Patent endovascular aneurysm repair with no evidence of endoleak.  The largest aortic diameter remains essentially unchanged compared to prior exam.  Previous diameter measurement was 5.0 cm obtained on 11/28/2022.   TRANSTHORACIC ECHOCARDIOGRAM performed on 10/31/2022 Normal left ventricular systolic function with an EF of >55%/ No regional wall motion abnormalities. Normal right ventricular size and function. Trivial PR Mild MR and TR Normal gradients; no valvular stenosis RVSP 28.0 mmHg No pericardial effusion  MYOCARDIAL PERFUSION IMAGING STUDY (LEXISCAN) performed on 10/31/2022 Normal left ventricular systolic function with a normal LVEF of 57% Normal myocardial thickening and wall motion Left ventricular cavity size normal SPECT images demonstrate homogenous tracer distribution throughout the myocardium No evidence of stress-induced myocardial ischemia or arrhythmia Normal low risk study  CT ABDOMEN PELVIS W CONTRAST performed on 09/19/2022 No acute intra-abdominal or pelvic pathology. No CT findings to account for the patient's weight loss. Cholelithiasis. Sigmoid diverticulosis. No bowel obstruction. Normal appendix. Partially thrombosed fusiform infrarenal abdominal aortic aneurysm measuring up  to 5 cm in diameter. Recommend follow-up CT/MR every 6 months and vascular consultation. Age indeterminate compression fracture of superior endplate of L1 with approximately 50% loss of vertebral body height and anterior wedging. Correlation with clinical exam and point tenderness recommended.    AAA DUPLEX performed on 03/07/2022 There is evidence of abnormal dilatation of the mid and distal Abdominal aorta.  The largest aortic diameter remains essentially unchanged compared to prior exam.  Previous diameter measurement was 4.53 x 4.31 cm  obtained on 02/23/21.    MR BRAIN WO CONTRAST performed on 06/06/2020 Multifocal acute ischemia within the right anterior cerebral artery territory and posterior limb of the right internal capsule. No hemorrhage or mass effect. Status post right communicating segment ICA and right ACA A2 segment aneurysm coiling. Medially projecting aneurysm of the cavernous segment of the left ICA.   Impression and Plan:  Autumn Le has been referred for pre-anesthesia review and clearance prior to her undergoing the planned anesthetic and procedural courses. Available labs, pertinent testing, and imaging results were personally reviewed by me in preparation for upcoming operative/procedural course. [facility] medical record has been updated following extensive record review and patient interview with PAT staff.   This patient has been appropriately cleared by cardiology with an overall ACCEPTABLE risk of experiencing significant perioperative cardiovascular complications. Based on clinical review performed today (08/06/23), barring any significant acute changes in the patient's overall condition, it is anticipated that she will be able to proceed with the planned surgical intervention. Any acute changes in clinical condition may necessitate her procedure being postponed and/or cancelled. Patient will meet with anesthesia team (MD and/or CRNA) on the day of her procedure for  preoperative evaluation/assessment. Questions regarding anesthetic course will be fielded at that time.   Pre-surgical instructions were reviewed with the patient during her PAT appointment, and questions were fielded to satisfaction by PAT clinical staff. She has been instructed on which medications that she will need to hold prior to surgery, as well as the ones that have been deemed safe/appropriate to take on the day of her procedure. As part of the general education provided by PAT, patient made aware both verbally and in writing, that she would need to abstain from the use of any illegal substances during her perioperative course.  She was advised that failure to follow the provided instructions could necessitate case cancellation or result in serious perioperative complications up to and including death. Patient encouraged to contact PAT and/or her surgeon's office to discuss any questions or concerns that may arise prior to surgery; verbalized understanding.   Quentin Mulling, MSN, APRN, FNP-C, CEN [facility]  Perioperative Services Nurse Practitioner Phone: [phone] Fax: [phone] 08/06/23 5:17 PM  NOTE: This note has been prepared using Dragon dictation software. Despite my best ability to proofread, there is always the potential that unintentional transcriptional errors may still occur from this process.

## 2023-08-09 MED ORDER — LACTATED RINGERS IV SOLN: INTRAVENOUS | Status: DC

## 2023-08-09 MED ORDER — ORAL CARE MOUTH RINSE: 15.0000 mL | Freq: Once | OROMUCOSAL | Status: AC

## 2023-08-09 MED ORDER — CEFAZOLIN SODIUM-DEXTROSE 2-4 GM/100ML-% IV SOLN
2.0000 g | INTRAVENOUS | Status: AC
  Administered 2023-08-10: 2 g via INTRAVENOUS

## 2023-08-09 MED ORDER — CHLORHEXIDINE GLUCONATE 0.12 % MT SOLN
15.0000 mL | Freq: Once | OROMUCOSAL | Status: AC
  Administered 2023-08-10: 15 mL via OROMUCOSAL

## 2023-08-09 MED ORDER — FAMOTIDINE 20 MG PO TABS
20.0000 mg | ORAL_TABLET | Freq: Once | ORAL | Status: AC
  Administered 2023-08-10: 20 mg via ORAL

## 2023-08-10 ENCOUNTER — Other Ambulatory Visit: Payer: Self-pay

## 2023-08-10 ENCOUNTER — Ambulatory Visit: Payer: 59 | Admitting: Urgent Care

## 2023-08-10 ENCOUNTER — Encounter: Payer: Self-pay | Admitting: General Surgery

## 2023-08-10 ENCOUNTER — Encounter
Admission: RE | Disposition: A | Discharge status: Home / Self Care | Payer: Self-pay | Source: Ambulatory Visit | Attending: General Surgery

## 2023-08-10 ENCOUNTER — Ambulatory Visit
Admission: RE | Admit: 2023-08-10 | Discharge: 2023-08-10 | Disposition: A | Discharge status: Home / Self Care | Payer: 59 | Source: Ambulatory Visit | Attending: General Surgery | Admitting: General Surgery

## 2023-08-10 DIAGNOSIS — J439 Emphysema, unspecified: Secondary | ICD-10-CM | POA: Diagnosis not present

## 2023-08-10 DIAGNOSIS — I2510 Atherosclerotic heart disease of native coronary artery without angina pectoris: Secondary | ICD-10-CM | POA: Diagnosis not present

## 2023-08-10 DIAGNOSIS — M8580 Other specified disorders of bone density and structure, unspecified site: Secondary | ICD-10-CM | POA: Insufficient documentation

## 2023-08-10 DIAGNOSIS — D649 Anemia, unspecified: Secondary | ICD-10-CM | POA: Insufficient documentation

## 2023-08-10 DIAGNOSIS — Z7902 Long term (current) use of antithrombotics/antiplatelets: Secondary | ICD-10-CM | POA: Diagnosis not present

## 2023-08-10 DIAGNOSIS — L720 Epidermal cyst: Secondary | ICD-10-CM | POA: Diagnosis present

## 2023-08-10 DIAGNOSIS — I10 Essential (primary) hypertension: Secondary | ICD-10-CM | POA: Diagnosis not present

## 2023-08-10 DIAGNOSIS — E782 Mixed hyperlipidemia: Secondary | ICD-10-CM | POA: Insufficient documentation

## 2023-08-10 DIAGNOSIS — H8109 Meniere's disease, unspecified ear: Secondary | ICD-10-CM | POA: Insufficient documentation

## 2023-08-10 DIAGNOSIS — I739 Peripheral vascular disease, unspecified: Secondary | ICD-10-CM | POA: Diagnosis not present

## 2023-08-10 DIAGNOSIS — K219 Gastro-esophageal reflux disease without esophagitis: Secondary | ICD-10-CM | POA: Diagnosis not present

## 2023-08-10 DIAGNOSIS — Z79899 Other long term (current) drug therapy: Secondary | ICD-10-CM | POA: Insufficient documentation

## 2023-08-10 DIAGNOSIS — F17210 Nicotine dependence, cigarettes, uncomplicated: Secondary | ICD-10-CM | POA: Diagnosis not present

## 2023-08-10 DIAGNOSIS — G4700 Insomnia, unspecified: Secondary | ICD-10-CM | POA: Insufficient documentation

## 2023-08-10 DIAGNOSIS — Z7951 Long term (current) use of inhaled steroids: Secondary | ICD-10-CM | POA: Diagnosis not present

## 2023-08-10 DIAGNOSIS — I480 Paroxysmal atrial fibrillation: Secondary | ICD-10-CM | POA: Insufficient documentation

## 2023-08-10 HISTORY — DX: Restlessness and agitation: R45.1

## 2023-08-10 HISTORY — DX: Long term (current) use of anticoagulants: Z79.01

## 2023-08-10 HISTORY — DX: Peripheral vascular disease, unspecified: I73.9

## 2023-08-10 HISTORY — PX: CYST EXCISION: SHX5701

## 2023-08-10 HISTORY — DX: Long term (current) use of aspirin: Z79.82

## 2023-08-10 SURGERY — CYST REMOVAL
Anesthesia: Monitor Anesthesia Care | Site: Neck | Wound class: Clean

## 2023-08-10 MED ORDER — MIDAZOLAM HCL 2 MG/2ML IJ SOLN
INTRAMUSCULAR | Status: AC
  Filled 2023-08-10: qty 2

## 2023-08-10 MED ORDER — FENTANYL CITRATE (PF) 100 MCG/2ML IJ SOLN: 25.0000 ug | INTRAMUSCULAR | Status: DC | PRN

## 2023-08-10 MED ORDER — 0.9 % SODIUM CHLORIDE (POUR BTL) OPTIME
TOPICAL | Status: DC | PRN
  Administered 2023-08-10: 500 mL

## 2023-08-10 MED ORDER — PROPOFOL 1000 MG/100ML IV EMUL
INTRAVENOUS | Status: AC
  Filled 2023-08-10: qty 100

## 2023-08-10 MED ORDER — FAMOTIDINE 20 MG PO TABS
ORAL_TABLET | ORAL | Status: AC
  Filled 2023-08-10: qty 1

## 2023-08-10 MED ORDER — ONDANSETRON HCL 4 MG/2ML IJ SOLN: 4.0000 mg | Freq: Once | INTRAMUSCULAR | Status: DC | PRN

## 2023-08-10 MED ORDER — PROPOFOL 10 MG/ML IV BOLUS
INTRAVENOUS | Status: AC
  Filled 2023-08-10: qty 20

## 2023-08-10 MED ORDER — CEFAZOLIN SODIUM-DEXTROSE 2-4 GM/100ML-% IV SOLN
INTRAVENOUS | Status: AC
  Filled 2023-08-10: qty 100

## 2023-08-10 MED ORDER — PROPOFOL 10 MG/ML IV BOLUS
INTRAVENOUS | Status: DC | PRN
Start: 2023-08-10 — End: 2023-08-10
  Administered 2023-08-10 (×2): 30 mg via INTRAVENOUS

## 2023-08-10 MED ORDER — BUPIVACAINE-EPINEPHRINE 0.5% -1:200000 IJ SOLN
INTRAMUSCULAR | Status: DC | PRN
  Administered 2023-08-10: 7 mL

## 2023-08-10 MED ORDER — LIDOCAINE HCL (CARDIAC) PF 100 MG/5ML IV SOSY
PREFILLED_SYRINGE | INTRAVENOUS | Status: DC | PRN
  Administered 2023-08-10: 40 mg via INTRAVENOUS

## 2023-08-10 MED ORDER — MIDAZOLAM HCL 2 MG/2ML IJ SOLN
INTRAMUSCULAR | Status: DC | PRN
  Administered 2023-08-10: 1 mg via INTRAVENOUS

## 2023-08-10 MED ORDER — PROPOFOL 500 MG/50ML IV EMUL
INTRAVENOUS | Status: DC | PRN
  Administered 2023-08-10: 25 ug/kg/min via INTRAVENOUS

## 2023-08-10 MED ORDER — BUPIVACAINE-EPINEPHRINE (PF) 0.5% -1:200000 IJ SOLN
INTRAMUSCULAR | Status: AC
  Filled 2023-08-10: qty 10

## 2023-08-10 MED ORDER — CHLORHEXIDINE GLUCONATE 0.12 % MT SOLN
OROMUCOSAL | Status: AC
  Filled 2023-08-10: qty 15

## 2023-08-10 MED ORDER — FENTANYL CITRATE (PF) 100 MCG/2ML IJ SOLN
INTRAMUSCULAR | Status: AC
  Filled 2023-08-10: qty 2

## 2023-08-10 SURGICAL SUPPLY — 28 items
ADH SKN CLS APL DERMABOND .7 (GAUZE/BANDAGES/DRESSINGS) ×1
APL PRP STRL LF DISP 70% ISPRP (MISCELLANEOUS) ×1
CHLORAPREP W/TINT 26 (MISCELLANEOUS) ×1 IMPLANT
DERMABOND ADVANCED .7 DNX12 (GAUZE/BANDAGES/DRESSINGS) ×1 IMPLANT
DRAPE LAPAROTOMY 100X77 ABD (DRAPES) ×1 IMPLANT
ELECT CAUTERY BLADE 6.4 (BLADE) ×1 IMPLANT
ELECT REM PT RETURN 9FT ADLT (ELECTROSURGICAL) ×1
ELECTRODE REM PT RTRN 9FT ADLT (ELECTROSURGICAL) ×1 IMPLANT
GLOVE BIO SURGEON STRL SZ 6.5 (GLOVE) ×1 IMPLANT
GLOVE BIOGEL PI IND STRL 6.5 (GLOVE) ×1 IMPLANT
GOWN STRL REUS W/ TWL LRG LVL3 (GOWN DISPOSABLE) ×2 IMPLANT
GOWN STRL REUS W/TWL LRG LVL3 (GOWN DISPOSABLE) ×2
KIT TURNOVER KIT A (KITS) ×1 IMPLANT
LABEL OR SOLS (LABEL) ×1 IMPLANT
MANIFOLD NEPTUNE II (INSTRUMENTS) ×1 IMPLANT
NDL HYPO 25X1 1.5 SAFETY (NEEDLE) ×1 IMPLANT
NEEDLE HYPO 25X1 1.5 SAFETY (NEEDLE) ×1 IMPLANT
NS IRRIG 500ML POUR BTL (IV SOLUTION) ×1 IMPLANT
PACK BASIN MINOR ARMC (MISCELLANEOUS) ×1 IMPLANT
SUT ETHILON 3-0 (SUTURE) IMPLANT
SUT MNCRL 4-0 (SUTURE) ×1
SUT MNCRL 4-0 27XMFL (SUTURE) ×1
SUT VIC AB 3-0 SH 27 (SUTURE) ×1
SUT VIC AB 3-0 SH 27X BRD (SUTURE) ×1 IMPLANT
SUTURE MNCRL 4-0 27XMF (SUTURE) ×1 IMPLANT
SYR 10ML LL (SYRINGE) ×1 IMPLANT
TRAP FLUID SMOKE EVACUATOR (MISCELLANEOUS) ×1 IMPLANT
WATER STERILE IRR 500ML POUR (IV SOLUTION) ×1 IMPLANT

## 2023-08-10 NOTE — Transfer of Care (Signed)
Immediate Anesthesia Transfer of Care Note  Patient: TYLAN BRIGUGLIO  Procedure(s) Performed: CYST REMOVAL (Neck)  Patient Location: PACU  Anesthesia Type:General  Level of Consciousness: awake, alert , and oriented  Airway & Oxygen Therapy: Patient Spontanous Breathing and Patient connected to face mask oxygen  Post-op Assessment: Report given to RN and Post -op Vital signs reviewed and stable  Post vital signs: stable  Last Vitals:  Vitals Value Taken Time  BP    Temp    Pulse 60 08/10/23 0812  Resp    SpO2 100 % 08/10/23 0812  Vitals shown include unfiled device data.  Last Pain:  Vitals:   08/10/23 0625  TempSrc: Temporal  PainSc: 0-No pain         Complications: No notable events documented.

## 2023-08-10 NOTE — Interval H&P Note (Signed)
History and Physical Interval Note:  08/10/2023 6:39 AM  Autumn Le  has presented today for surgery, with the diagnosis of L72.0 epidermial inclusion cyst.  The various methods of treatment have been discussed with the patient and family. After consideration of risks, benefits and other options for treatment, the patient has consented to  Procedure(s): CYST REMOVAL (N/A) as a surgical intervention.  The patient's history has been reviewed, patient examined, no change in status, stable for surgery.  I have reviewed the patient's chart and labs.  Questions were answered to the patient's satisfaction.     Carolan Shiver

## 2023-08-10 NOTE — Op Note (Signed)
OPERATION REPORT  Pre Operative Diagnosis: Cyst of neck  Post operative diagnosis: Same  Anesthesia: MAC and Local   Surgeon: Dr. Hazle Quant   Indication: This 77 y.o. year old female with a soft tissue mass that is causing discomfort and increasing in size.    Description of procedure: after orienting patient about the procedure steps and benefits and patient agreed to proceed. Time out was done identifying correct patient and location of procedure. After induction of monitored sedation, local anesthesia was infiltrated around the palpable lesion. With a blade #15, an elliptical incision was made using the skin lines. Sharp dissection was carried down and lesion was excised including dermal tissue. The mass measured 3 cm. Deep dermal stitches were done with vicryl 3-0 to repair the laceration and skin closed with Monocryl 4-0 in subcuticular fashion. Specimen sent to pathology.    Complications: none   EBL: minimal  Carolan Shiver, MD, FACS

## 2023-08-10 NOTE — Discharge Instructions (Addendum)
  Diet: Resume home heart healthy regular diet.   Activity: Increase activity as tolerated.   Wound care: May shower with soapy water and pat dry (do not rub incisions), but no baths or submerging incision underwater until follow-up. (no swimming)   Medications: Resume all home medications. For mild to moderate pain: acetaminophen (Tylenol) or ibuprofen (if no kidney disease).   Call office [phone]) at any time if any questions, worsening pain, fevers/chills, bleeding, drainage from incision site, or other concerns.   AMBULATORY SURGERY  DISCHARGE INSTRUCTIONS   The drugs that you were given will stay in your system until tomorrow so for the next 24 hours you should not:  Drive an automobile Make any legal decisions Drink any alcoholic beverage   You may resume regular meals tomorrow.  Today it is better to start with liquids and gradually work up to solid foods.  You may eat anything you prefer, but it is better to start with liquids, then soup and crackers, and gradually work up to solid foods.   Please notify your doctor immediately if you have any unusual bleeding, trouble breathing, redness and pain at the surgery site, drainage, fever, or pain not relieved by medication.    Additional Instructions:    Please contact your physician with any problems or Same Day Surgery at [phone], Monday through Friday 6 am to 4 pm, or [place] at [facility] number at [phone].

## 2023-08-10 NOTE — Anesthesia Preprocedure Evaluation (Signed)
Anesthesia Evaluation  Patient identified by MRN, date of birth, ID band Patient awake    Reviewed: Allergy & Precautions, NPO status , Patient's Chart, lab work & pertinent test results  Airway Mallampati: III  TM Distance: <3 FB Neck ROM: full    Dental  (+) Teeth Intact, Caps   Pulmonary neg pulmonary ROS, COPD,  COPD inhaler, Current Smoker and Patient abstained from smoking.   Pulmonary exam normal  + decreased breath sounds      Cardiovascular Exercise Tolerance: Poor hypertension, Pt. on medications + CAD and + Peripheral Vascular Disease  negative cardio ROS Normal cardiovascular exam Rhythm:Regular Rate:Normal     Neuro/Psych negative neurological ROS  negative psych ROS   GI/Hepatic negative GI ROS, Neg liver ROS,GERD  Medicated,,  Endo/Other  negative endocrine ROS    Renal/GU negative Renal ROS     Musculoskeletal   Abdominal   Peds negative pediatric ROS (+)  Hematology negative hematology ROS (+) Blood dyscrasia, anemia   Anesthesia Other Findings Past Medical History: No date: AAA (abdominal aortic aneurysm) (HCC)     Comment:  a.) aortic US 02/05/2020: infrarenal AAA measuring 4.2 x              4.6 cm; b.) AAA duplex 08/12/2020: measured 4.3 cm; c.)               AAA duplex 02/23/2021: measured 4.53 x 4.31 cm; d.) AAA               doppler 03/07/2022: measured 4.53 x 4.31 cm; e.) LD chest              CT 06/14/2022: measured 4.6 cm; f.) CT abd: partially               thrombosed measuring up to 5.0 cm (overread by vascular               suggested larger at 5.2-5.3 cm); g.) s/p EVAR 11/01/2022 No date: Agitation No date: Anemia 01/23/2020: 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     Comment:  a.) IR angio 01/23/2020: 8.0 x 5.6 mm wide necked               aneurysm arising from the horizontal segment of the               cavernous LEFT ICA 01/23/2020: Anterior  communicating artery aneurysm     Comment:  a.) IR angio 01/23/2020: 3.9 x 4.6 mm aneurysm at the               bifurcation of the A2/A3 segment into the pericallosal               and callosal marginal arteries; b.) s/p coli embolization              06/04/2020 No date: Aortic atherosclerosis (HCC) No date: CAD (coronary artery disease)     Comment:  a.) MV 10/31/2022: no ischemia No date: Compression fracture of L1 lumbar vertebra (HCC) No date: COPD (chronic obstructive pulmonary disease) (HCC) No date: DDD (degenerative disc disease), cervical No date: Degenerative joint disease of cervical spine No date: Diverticulosis No date: Emphysema lung (HCC) No date: Epidermoid cyst of neck No date: Eustachian tube dysfunction No date: GERD (gastroesophageal reflux disease) No date: HLD (hyperlipidemia) No date: HTN (hypertension) No date: Hypokalemia No date: Insomnia No date: Long-term use of aspirin therapy No date: Lung nodule No date: Meniere's disease in  remission, bilateral No date: On long term clopidogrel therapy No date: Osteopenia No date: PAD (peripheral artery disease) (HCC)     Comment:  a.) s/p PTA fo LEFT CFA and SFA 11/02/2022; b.) s/p LEFT              femoral endarterectomy 11/03/2022 No date: PAF (paroxysmal atrial fibrillation) (HCC) 01/23/2020: 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)     Comment:  a.) IR angio 01/23/2020: 6.2 x 6.1 x 8.9 ruptured               aneurysm arising from the origion of the RIGHT PCA               projecting laterally --> coil embolization performed               01/23/2020 01/23/2020: 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 (HCC)     Comment:  a.) CT head 01/23/2020: extensive SAH about the RIGHT               cerebral hemisphere and basal cisterns secondary to RIGHT              PCA aneurysm rupture No date: Tobacco abuse No date: Unintentional weight loss      Comment:  a.) reports eating well; eats 4-5 x/day and still feels               hungry; b.) weight decreasing: 104 lb (02/2021), 88 lb               (02/2022), 86 lb (08/2022), 83 lb (10/2022), 82 lb               (07/2023) No date: Vertigo  Past Surgical History: 10/2022: ABDOMINAL AORTIC ANEURYSM REPAIR 2021: BRAIN SURGERY No date: CATARACT EXTRACTION W/ INTRAOCULAR LENS  IMPLANT, BILATERAL 11/03/2022: ENDARTERECTOMY FEMORAL; Left     Comment:  Procedure: ENDARTERECTOMY FEMORAL;  Surgeon: Annice Needy, MD;  Location: [facility] ORS;  Service: Vascular;                Laterality: Left; 10/2022: endocardectomy femerol 11/01/2022: ENDOVASCULAR REPAIR/STENT GRAFT; N/A     Comment:  Procedure: ENDOVASCULAR REPAIR/STENT GRAFT;  Surgeon:               Annice Needy, MD;  Location: [facility] INVASIVE [facility];                Service: Cardiovascular;  Laterality: N/A; No date: ENDOVASCULAR REPAIR/STENT GRAFT No date: EYE SURGERY 01/23/2020: IR ANGIO INTRA EXTRACRAN SEL INTERNAL CAROTID BILAT MOD  SED 06/04/2020: IR ANGIO INTRA EXTRACRAN SEL INTERNAL CAROTID UNI R MOD  SED 01/23/2020: IR ANGIO VERTEBRAL SEL VERTEBRAL UNI L MOD SED 01/23/2020: IR ANGIOGRAM FOLLOW UP STUDY 01/23/2020: IR ANGIOGRAM FOLLOW UP STUDY 01/23/2020: IR ANGIOGRAM FOLLOW UP STUDY 01/23/2020: IR ANGIOGRAM FOLLOW UP STUDY 06/04/2020: IR ANGIOGRAM FOLLOW UP STUDY 06/04/2020: IR ANGIOGRAM FOLLOW UP STUDY 06/04/2020: IR NEURO EACH ADD'L AFTER BASIC UNI RIGHT (MS) 01/23/2020: IR TRANSCATH/EMBOLIZ 06/04/2020: IR TRANSCATH/EMBOLIZ 06/04/2020: IR US GUIDE VASC ACCESS RIGHT 11/02/2022: LOWER EXTREMITY ANGIOGRAPHY; Left     Comment:  Procedure: Lower Extremity Angiography;  Surgeon: Annice Needy, MD;  Location: [facility] INVASIVE CV LAB;  Service:  Cardiovascular;  Laterality: Left; 06/23/2021: MYRINGOTOMY WITH TUBE PLACEMENT; Left     Comment:  Procedure: MYRINGOTOMY WITH TUBE PLACEMENT;  Surgeon:                Vernie Murders, MD;  Location: [facility];                Service: ENT;  Laterality: Left; 01/23/2020: RADIOLOGY WITH ANESTHESIA; N/A     Comment:  Procedure: RADIOLOGY WITH ANESTHESIA;  Surgeon:               Lisbeth Renshaw, MD;  Location: [facility] OR;  Service:               Radiology;  Laterality: N/A; 06/04/2020: RADIOLOGY WITH ANESTHESIA; N/A     Comment:  Procedure: Coil embolization of left ACA aneurysm;                Surgeon: Lisbeth Renshaw, MD;  Location: [facility] OR;                Service: Radiology;  Laterality: N/A; No date: TONSILLECTOMY  BMI    Body Mass Index: 15.49 kg/m      Reproductive/Obstetrics negative OB ROS                             Anesthesia Physical Anesthesia Plan  ASA: 3  Anesthesia Plan: MAC   Post-op Pain Management:    Induction: Intravenous  PONV Risk Score and Plan:   Airway Management Planned: Natural Airway and Nasal Cannula  Additional Equipment:   Intra-op Plan:   Post-operative Plan:   Informed Consent: I have reviewed the patients History and Physical, chart, labs and discussed the procedure including the risks, benefits and alternatives for the proposed anesthesia with the patient or authorized representative who has indicated his/her understanding and acceptance.       Plan Discussed with: CRNA and Surgeon  Anesthesia Plan Comments:        Anesthesia Quick Evaluation

## 2023-08-10 NOTE — Anesthesia Postprocedure Evaluation (Signed)
Anesthesia Post Note  Patient: Autumn Le  Procedure(s) Performed: CYST REMOVAL (Neck)  Patient location during evaluation: PACU Anesthesia Type: MAC Level of consciousness: awake and awake and alert Pain management: satisfactory to patient Vital Signs Assessment: post-procedure vital signs reviewed and stable Respiratory status: spontaneous breathing Cardiovascular status: blood pressure returned to baseline Anesthetic complications: no   No notable events documented.   Last Vitals:  Vitals:   08/10/23 0841 08/10/23 0842  BP:  136/85  Pulse: (!) 55   Resp: 18   Temp: 36.6 C   SpO2:      Last Pain:  Vitals:   08/10/23 0841  TempSrc: Temporal  PainSc: 0-No pain                 VAN STAVEREN,Tata Timmins

## 2023-08-13 ENCOUNTER — Encounter: Payer: Self-pay | Admitting: General Surgery

## 2023-08-13 LAB — SURGICAL PATHOLOGY

## 2023-09-04 ENCOUNTER — Telehealth (INDEPENDENT_AMBULATORY_CARE_PROVIDER_SITE_OTHER): Payer: Self-pay

## 2023-09-04 ENCOUNTER — Ambulatory Visit (INDEPENDENT_AMBULATORY_CARE_PROVIDER_SITE_OTHER): Payer: 59

## 2023-09-04 ENCOUNTER — Ambulatory Visit (INDEPENDENT_AMBULATORY_CARE_PROVIDER_SITE_OTHER): Payer: 59 | Admitting: Vascular Surgery

## 2023-09-04 ENCOUNTER — Encounter (INDEPENDENT_AMBULATORY_CARE_PROVIDER_SITE_OTHER): Payer: Self-pay | Admitting: Vascular Surgery

## 2023-09-04 VITALS — BP 154/74 | HR 58 | Resp 15 | Wt 83.0 lb

## 2023-09-04 DIAGNOSIS — I7143 Infrarenal abdominal aortic aneurysm, without rupture: Secondary | ICD-10-CM | POA: Diagnosis not present

## 2023-09-04 DIAGNOSIS — I10 Essential (primary) hypertension: Secondary | ICD-10-CM

## 2023-09-04 DIAGNOSIS — I70212 Atherosclerosis of native arteries of extremities with intermittent claudication, left leg: Secondary | ICD-10-CM | POA: Diagnosis not present

## 2023-09-04 DIAGNOSIS — E782 Mixed hyperlipidemia: Secondary | ICD-10-CM

## 2023-09-04 NOTE — Telephone Encounter (Signed)
Patient called stating she was seen this morning by Dr.Dew and he took her off Plavix. She wanted to know if she can be taken off Rosuvastatin as well.   Per Dr.Dew she can not come off

## 2023-09-04 NOTE — Progress Notes (Unsigned)
MRN : [id number]  Autumn Le is a 77 y.o. (13-Oct-1946) female who presents with chief complaint of  Chief Complaint  Patient presents with   Follow-up    3 month EVAR  .  History of Present Illness: Patient returns today in follow up of multiple vascular issues.  The patient is doing great.  She has no complaints or problems.  She underwent an endovascular abdominal aortic aneurysm repair as well as subsequent left femoral endarterectomy earlier this year.  She is walking well without any current leg pain.  She has been on aspirin and Plavix and desires to come off of Plavix due to excessive bruising.  She has no aneurysm related symptoms such as back or abdominal pain or signs of peripheral embolization.  Duplex today shows significant shrinkage of her aortic sac down to 4.3 cm in maximal diameter.  Previously, it was 4.9 cm.  No endoleak is present. ABIs are 0.93 on the right and 0.92 on the left with biphasic waveforms.  Current Outpatient Medications  Medication Sig Dispense Refill   acetaminophen (TYLENOL) 325 MG tablet Take 1-2 tablets (325-650 mg total) by mouth every 4 (four) hours as needed for mild pain (or temp >/= 101 F). 30 tablet 0   Ascorbic Acid (VITAMIN C) 1000 MG tablet Take 1,000 mg by mouth daily.     aspirin EC 81 MG tablet Take 1 tablet (81 mg total) by mouth daily at 6 (six) AM. Swallow whole. 30 tablet 12   Calcium Carbonate (CALCIUM 600 PO) Take 600 mg by mouth at bedtime.     Cholecalciferol 50 MCG (2000 UT) CAPS Take 2,000 Units by mouth at bedtime.      clopidogrel (PLAVIX) 75 MG tablet Take 75 mg by mouth daily.     Docusate Calcium (STOOL SOFTENER PO) Take 1-2 tablets by mouth daily.     fluticasone (FLONASE) 50 MCG/ACT nasal spray Place 2 sprays into both nostrils as needed for allergies.     fluticasone furoate-vilanterol (BREO ELLIPTA) 100-25 MCG/INH AEPB Inhale 1 puff into the lungs every morning.     HYDROcodone-acetaminophen (NORCO/VICODIN) 5-325 MG  tablet Take 1 tablet by mouth every 4 (four) hours as needed for moderate pain. 20 tablet 0   loratadine (CLARITIN) 10 MG tablet Take 10 mg by mouth at bedtime.     Multiple Vitamins-Minerals (VITAMIN D3 COMPLETE PO) Take 1 tablet by mouth daily at 6 (six) AM.     rosuvastatin (CRESTOR) 5 MG tablet TAKE 1 TABLET (5 MG TOTAL) BY MOUTH DAILY. 90 tablet 1   ezetimibe (ZETIA) 10 MG tablet Take 20 mg by mouth at bedtime.     No current facility-administered medications for this visit.    Past Medical History:  Diagnosis Date   AAA (abdominal aortic aneurysm) (HCC)    a.) aortic US 02/05/2020: infrarenal AAA measuring 4.2 x 4.6 cm; b.) AAA duplex 08/12/2020: measured 4.3 cm; c.) AAA duplex 02/23/2021: measured 4.53 x 4.31 cm; d.) AAA doppler 03/07/2022: measured 4.53 x 4.31 cm; e.) LD chest CT 06/14/2022: measured 4.6 cm; f.) CT abd: partially thrombosed measuring up to 5.0 cm (overread by vascular suggested larger at 5.2-5.3 cm); g.) s/p EVAR 11/01/2022   Agitation    Anemia    Aneurysm of cavernous portion of left internal carotid artery 01/23/2020   a.) IR angio 01/23/2020: 8.0 x 5.6 mm wide necked aneurysm arising from the horizontal segment of the cavernous LEFT ICA   Anterior communicating artery aneurysm  01/23/2020   a.) IR angio 01/23/2020: 3.9 x 4.6 mm aneurysm at the bifurcation of the A2/A3 segment into the pericallosal and callosal marginal arteries; b.) s/p coli embolization 06/04/2020   Aortic atherosclerosis (HCC)    CAD (coronary artery disease)    a.) MV 10/31/2022: no ischemia   Compression fracture of L1 lumbar vertebra (HCC)    COPD (chronic obstructive pulmonary disease) (HCC)    DDD (degenerative disc disease), cervical    Degenerative joint disease of cervical spine    Diverticulosis    Emphysema lung (HCC)    Epidermoid cyst of neck    Eustachian tube dysfunction    GERD (gastroesophageal reflux disease)    HLD (hyperlipidemia)    HTN (hypertension)    Hypokalemia     Insomnia    Long-term use of aspirin therapy    Lung nodule    Meniere's disease in remission, bilateral    On long term clopidogrel therapy    Osteopenia    PAD (peripheral artery disease) (HCC)    a.) s/p PTA fo LEFT CFA and SFA 11/02/2022; b.) s/p LEFT femoral endarterectomy 11/03/2022   PAF (paroxysmal atrial fibrillation) (HCC)    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   a.) IR angio 01/23/2020: 6.2 x 6.1 x 8.9 ruptured aneurysm arising from the origion of the RIGHT PCA projecting laterally --> coil embolization performed 01/23/2020   Subarachnoid 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 (HCC) 01/23/2020   a.) CT head 01/23/2020: extensive SAH about the RIGHT cerebral hemisphere and basal cisterns secondary to RIGHT PCA aneurysm rupture   Tobacco abuse    Unintentional weight loss    a.) reports eating well; eats 4-5 x/day and still feels hungry; b.) weight decreasing: 104 lb (02/2021), 88 lb (02/2022), 86 lb (08/2022), 83 lb (10/2022), 82 lb (07/2023)   Vertigo     Past Surgical History:  Procedure Laterality Date   ABDOMINAL AORTIC ANEURYSM REPAIR  10/2022   BRAIN SURGERY  2021   CATARACT EXTRACTION W/ INTRAOCULAR LENS  IMPLANT, BILATERAL     CYST EXCISION N/A 08/10/2023   Procedure: CYST REMOVAL;  Surgeon: Carolan Shiver, MD;  Location: [facility];  Service: General;  Laterality: N/A;   ENDARTERECTOMY FEMORAL Left 11/03/2022   Procedure: ENDARTERECTOMY FEMORAL;  Surgeon: Annice Needy, MD;  Location: [facility];  Service: Vascular;  Laterality: Left;   endocardectomy femerol  10/2022   ENDOVASCULAR REPAIR/STENT GRAFT N/A 11/01/2022   Procedure: ENDOVASCULAR REPAIR/STENT GRAFT;  Surgeon: Annice Needy, MD;  Location: [facility];  Service: Cardiovascular;  Laterality: N/A;   ENDOVASCULAR REPAIR/STENT GRAFT     EYE SURGERY     IR ANGIO INTRA EXTRACRAN SEL INTERNAL CAROTID BILAT MOD SED  01/23/2020   IR  ANGIO INTRA EXTRACRAN SEL INTERNAL CAROTID UNI R MOD SED  06/04/2020   IR ANGIO VERTEBRAL SEL VERTEBRAL UNI L MOD SED  01/23/2020   IR ANGIOGRAM FOLLOW UP STUDY  01/23/2020   IR ANGIOGRAM FOLLOW UP STUDY  01/23/2020   IR ANGIOGRAM FOLLOW UP STUDY  01/23/2020   IR ANGIOGRAM FOLLOW UP STUDY  01/23/2020   IR ANGIOGRAM FOLLOW UP STUDY  06/04/2020   IR ANGIOGRAM FOLLOW UP STUDY  06/04/2020   IR NEURO EACH ADD'L AFTER BASIC UNI RIGHT (MS)  06/04/2020   IR TRANSCATH/EMBOLIZ  01/23/2020   IR TRANSCATH/EMBOLIZ  06/04/2020   IR US GUIDE VASC ACCESS RIGHT  06/04/2020   LOWER EXTREMITY ANGIOGRAPHY Left 11/02/2022  Procedure: Lower Extremity Angiography;  Surgeon: Annice Needy, MD;  Location: [facility];  Service: Cardiovascular;  Laterality: Left;   MYRINGOTOMY WITH TUBE PLACEMENT Left 06/23/2021   Procedure: MYRINGOTOMY WITH TUBE PLACEMENT;  Surgeon: Vernie Murders, MD;  Location: [facility];  Service: ENT;  Laterality: Left;   RADIOLOGY WITH ANESTHESIA N/A 01/23/2020   Procedure: RADIOLOGY WITH ANESTHESIA;  Surgeon: Lisbeth Renshaw, MD;  Location: [facility] OR;  Service: Radiology;  Laterality: N/A;   RADIOLOGY WITH ANESTHESIA N/A 06/04/2020   Procedure: Coil embolization of left ACA aneurysm;  Surgeon: Lisbeth Renshaw, MD;  Location: [facility] OR;  Service: Radiology;  Laterality: N/A;   TONSILLECTOMY       Social History   Tobacco Use   Smoking status: Every Day    Current packs/day: 0.50    Average packs/day: 0.5 packs/day for 55.0 years (27.5 ttl pk-yrs)    Types: Cigarettes   Smokeless tobacco: Never  Vaping Use   Vaping status: Never Used  Substance Use Topics   Alcohol use: Not Currently    Comment: occasional glass of wine    Drug use: Not Currently      Family History  Problem Relation Age of Onset   Heart disease Mother    Breast cancer Maternal Aunt   No bleeding or clotting disorders   Allergies  Allergen Reactions   Latex Hives    bandaids   Nickel  Other (See Comments)    blisters     REVIEW OF SYSTEMS (Negative unless checked)   Constitutional: [] Weight loss  [] Fever  [] Chills Cardiac: [] Chest pain   [] Chest pressure   [] Palpitations   [] Shortness of breath when laying flat   [x] Shortness of breath at rest   [x] Shortness of breath with exertion. Vascular:  [x] Pain in legs with walking   [] Pain in legs at rest   [] Pain in legs when laying flat   [] Claudication   [] Pain in feet when walking  [] Pain in feet at rest  [] Pain in feet when laying flat   [] History of DVT   [] Phlebitis   [] Swelling in legs   [] Varicose veins   [] Non-healing ulcers Pulmonary:   [] Uses home oxygen   [] Productive cough   [] Hemoptysis   [] Wheeze  [x] COPD   [] Asthma Neurologic:  [] Dizziness  [] Blackouts   [] Seizures   [] History of stroke   [] History of TIA  [] Aphasia   [] Temporary blindness   [] Dysphagia   [] Weakness or numbness in arms   [] Weakness or numbness in legs Musculoskeletal:  [x] Arthritis   [] Joint swelling   [] Joint pain   [] Low back pain Hematologic:  [x] Easy bruising  [] Easy bleeding   [] Hypercoagulable state   [x] Anemic   Gastrointestinal:  [] Blood in stool   [] Vomiting blood  [] Gastroesophageal reflux/heartburn   [] Abdominal pain Genitourinary:  [] Chronic kidney disease   [] Difficult urination  [] Frequent urination  [] Burning with urination   [] Hematuria Skin:  [] Rashes   [] Ulcers   [] Wounds Psychological:  [] History of anxiety   []  History of major depression.  Physical Examination  BP (!) 154/74 (BP Location: Left Arm)   Pulse (!) 58   Resp 15   Wt 83 lb (37.6 kg)   BMI 15.68 kg/m  Gen:  NAD, thin and frail Head: [place]/AT, + temporalis wasting. Ear/Nose/Throat: Hearing grossly intact, nares w/o erythema or drainage Eyes: Conjunctiva clear. Sclera non-icteric Neck: Supple.  Trachea midline Pulmonary:  Good air movement, no use of accessory muscles.  Cardiac: RRR, no JVD Vascular:  Vessel Right Left  Radial Palpable Palpable                           PT Palpable Palpable  DP Palpable Palpable   Gastrointestinal: soft, non-tender/non-distended. No guarding/reflex.  Musculoskeletal: M/S 5/5 throughout.  No deformity or atrophy. No edema. Neurologic: Sensation grossly intact in extremities.  Symmetrical.  Speech is fluent.  Psychiatric: Judgment intact, Mood & affect appropriate for pt's clinical situation. Dermatologic: No rashes or ulcers noted.  No cellulitis or open wounds.      Labs Recent Results (from the past 2160 hour(s))  Surgical pathology     Status: None   Collection Time: 08/10/23 12:00 AM  Result Value Ref Range   SURGICAL PATHOLOGY      SURGICAL PATHOLOGY [facility] [address], [address] [place], [place] [address] Telephone [phone] or [phone] Fax [phone]  REPORT OF SURGICAL PATHOLOGY   Accession #: [id number] Patient Name: KEALY, SNOPEK Visit # : [id number]  MRN: [id number] Physician: Carolan Shiver DOB/Age 26-Jan-1946 (Age: 36) Gender: F Collected Date: 08/10/2023 Received Date: 08/10/2023  FINAL DIAGNOSIS       1. Cyst, excision, Neck :       - EPIDERMAL INCLUSION CYST.       DATE SIGNED OUT: 08/13/2023 ELECTRONIC SIGNATURE : Oneita Kras Md, Delice Bison , Pathologist, Electronic Signature  MICROSCOPIC DESCRIPTION  CASE COMMENTS STAINS USED IN DIAGNOSIS: H&E    CLINICAL HISTORY  SPECIMEN(S) OBTAINED 1. Cyst, excision, Neck  SPECIMEN COMMENTS: SPECIMEN CLINICAL INFORMATION:    Gross Description 1. Received in formalin labeled with the patient's name and "Neck cyst" is a 2.0 x 1.1 cm light tan skin ellipse with an attached 1.9 cm subc utaneous cystic structure filled with tan grumous material. Representative sections are submitted in a single cassette.  (LEF, 08/10/2023)        Report signed out from the following location(s) [place] [address], [place] [address] CLIA #:  [id number]  [facility] [address] [place], [place] [address] CLIA #: [id number]     Radiology No results found.  Assessment/Plan Hyperlipidemia, mixed lipid control important in reducing the progression of atherosclerotic disease. Continue statin therapy     Essential hypertension blood pressure control important in reducing the progression of atherosclerotic disease and aneurysmal growth. On appropriate oral medications.  AAA (abdominal aortic aneurysm) without rupture (HCC) Duplex today shows significant shrinkage of her aortic sac down to 4.3 cm in maximal diameter.  Previously, it was 4.9 cm.  No endoleak is present. Doing well. Follow up in 6 months with duplex. OK to stop Plavix  Atherosclerosis of native arteries of extremity with intermittent claudication (HCC) ABIs are 0.93 on the right and 0.92 on the left with biphasic waveforms.  Markedly improved after revascularization earlier this year.  Okay to stop Plavix and remain on aspirin.  Follow-up in 6 months with ABIs.    [name], MD  09/05/2023 8:15 AM    This note was created with Dragon medical transcription system.  Any errors from dictation are purely unintentional

## 2023-09-05 NOTE — Assessment & Plan Note (Signed)
ABIs are 0.93 on the right and 0.92 on the left with biphasic waveforms.  Markedly improved after revascularization earlier this year.  Okay to stop Plavix and remain on aspirin.  Follow-up in 6 months with ABIs.

## 2023-09-05 NOTE — Assessment & Plan Note (Signed)
Duplex today shows significant shrinkage of her aortic sac down to 4.3 cm in maximal diameter.  Previously, it was 4.9 cm.  No endoleak is present. Doing well. Follow up in 6 months with duplex. OK to stop Plavix

## 2023-09-05 NOTE — Telephone Encounter (Signed)
Left voice mail

## 2023-09-06 LAB — VAS US ABI WITH/WO TBI
Left ABI: 0.92
Right ABI: 0.93

## 2023-10-18 ENCOUNTER — Emergency Department
Admission: EM | Admit: 2023-10-18 | Discharge: 2023-10-18 | Disposition: A | Discharge status: Home / Self Care | Payer: 59 | Attending: Emergency Medicine | Admitting: Emergency Medicine

## 2023-10-18 ENCOUNTER — Other Ambulatory Visit: Payer: Self-pay

## 2023-10-18 ENCOUNTER — Emergency Department: Payer: 59

## 2023-10-18 ENCOUNTER — Encounter: Payer: Self-pay | Admitting: Emergency Medicine

## 2023-10-18 DIAGNOSIS — R1011 Right upper quadrant pain: Secondary | ICD-10-CM | POA: Diagnosis present

## 2023-10-18 DIAGNOSIS — Z87891 Personal history of nicotine dependence: Secondary | ICD-10-CM | POA: Diagnosis not present

## 2023-10-18 DIAGNOSIS — K8020 Calculus of gallbladder without cholecystitis without obstruction: Secondary | ICD-10-CM | POA: Diagnosis not present

## 2023-10-18 DIAGNOSIS — R1013 Epigastric pain: Secondary | ICD-10-CM

## 2023-10-18 DIAGNOSIS — J449 Chronic obstructive pulmonary disease, unspecified: Secondary | ICD-10-CM | POA: Insufficient documentation

## 2023-10-18 LAB — COMPREHENSIVE METABOLIC PANEL
ALT: 6 U/L (ref 0–44)
AST: 17 U/L (ref 15–41)
Albumin: 3.5 g/dL (ref 3.5–5.0)
Alkaline Phosphatase: 82 U/L (ref 38–126)
Anion gap: 7 (ref 5–15)
BUN: 22 mg/dL (ref 8–23)
CO2: 27 mmol/L (ref 22–32)
Calcium: 8.9 mg/dL (ref 8.9–10.3)
Chloride: 107 mmol/L (ref 98–111)
Creatinine, Ser: 0.59 mg/dL (ref 0.44–1.00)
GFR, Estimated: >60 mL/min (ref >60)
Glucose, Bld: 96 mg/dL (ref 70–99)
Potassium: 4.3 mmol/L (ref 3.5–5.1)
Sodium: 141 mmol/L (ref 135–145)
Total Bilirubin: 0.6 mg/dL (ref <1.2)
Total Protein: 6.5 g/dL (ref 6.5–8.1)

## 2023-10-18 LAB — TROPONIN I (HIGH SENSITIVITY)
Troponin I (High Sensitivity): 5 ng/L (ref <18)
Troponin I (High Sensitivity): 4 ng/L (ref <18)

## 2023-10-18 LAB — CBC
HCT: 38.7 % (ref 36.0–46.0)
Hemoglobin: 12.6 g/dL (ref 12.0–15.0)
MCH: 32.7 pg (ref 26.0–34.0)
MCHC: 32.6 g/dL (ref 30.0–36.0)
MCV: 100.5 fL — ABNORMAL HIGH (ref 80.0–100.0)
Platelets: 150 10*3/uL (ref 150–400)
RBC: 3.85 MIL/uL — ABNORMAL LOW (ref 3.87–5.11)
RDW: 12.7 % (ref 11.5–15.5)
WBC: 9.6 10*3/uL (ref 4.0–10.5)
nRBC: 0 % (ref 0.0–0.2)

## 2023-10-18 LAB — LIPASE, BLOOD: Lipase: 38 U/L (ref 11–51)

## 2023-10-18 MED ORDER — OMEPRAZOLE MAGNESIUM 20 MG PO TBEC: 20.0000 mg | DELAYED_RELEASE_TABLET | Freq: Every day | ORAL | 2 refills | Status: DC

## 2023-10-18 MED ORDER — FAMOTIDINE IN NACL 20-0.9 MG/50ML-% IV SOLN
20.0000 mg | Freq: Once | INTRAVENOUS | Status: AC
  Administered 2023-10-18: 20 mg via INTRAVENOUS
  Filled 2023-10-18: qty 50

## 2023-10-18 MED ORDER — IOHEXOL 350 MG/ML SOLN
75.0000 mL | Freq: Once | INTRAVENOUS | Status: AC | PRN
  Administered 2023-10-18: 75 mL via INTRAVENOUS

## 2023-10-18 NOTE — Discharge Instructions (Signed)
You were seen in the emergency department for upper abdominal pain that was worse after eating.  You had a CT scan that did not show any abnormalities of your abdominal aneurysm repair.  You did have a finding of a gallstone but no signs of a gallbladder infection.  Your lab work was overall normal.  Concerned that you could have symptoms from your gallstone or an ulcer in your stomach or your duodenum.  Avoid fried foods, citrusy foods and alcohol as these can make your symptoms worse.  You were started on an acid reducing medication.  Follow-up closely with your primary care physician and return to the emergency department if you have return of symptoms.  Follow-up with gastroenterology at your scheduled visit.  You are given information to follow-up with general surgery as needed for symptomatic gallstones.  Thank you for choosing [place] for your health care, it was my pleasure to care for you today!  Corena Herter, MD

## 2023-10-18 NOTE — ED Provider Notes (Signed)
[facility] Provider Note    Event Date/Time   First MD Initiated Contact with Patient 10/18/23 1000     (approximate)   History   Abdominal Pain   HPI  Autumn Le is a 77 y.o. female past medical history significant for AAA status post repair, intracranial aneurysm status post coil, hyperlipidemia, PAD, COPD, presents to the emergency department with abdominal pain.  Endorses 2 days of abdominal pain in her upper abdomen.  States that the pain has been diffuse and then radiating to the right side.  Worse after eating.  Endorses severe pain that has had some improvement since laying down in the emergency department.  Denies any significant nausea, vomiting or diaphoresis.  Denies any diarrhea or constipation.  Denies dysuria, urinary urgency or frequency.  States that she has had a AAA repair last year with Dr. Wyn Quaker with vascular surgery.  Also has a history of intracranial aneurysm repair.  Significant weight loss.  States that she recently stopped smoking cigarettes approximately 1 month ago.     Physical Exam   Triage Vital Signs: ED Triage Vitals  Encounter Vitals Group     BP 10/18/23 0923 116/66     Systolic BP Percentile --      Diastolic BP Percentile --      Pulse Rate 10/18/23 0923 62     Resp 10/18/23 0923 16     Temp --      Temp src --      SpO2 10/18/23 0923 100 %     Weight 10/18/23 0924 84 lb (38.1 kg)     Height 10/18/23 0924 5\' 2"  (1.575 m)     Head Circumference --      Peak Flow --      Pain Score 10/18/23 0923 5     Pain Loc --      Pain Education --      Exclude from Growth Chart --     Most recent vital signs: Vitals:   10/18/23 0923  BP: 116/66  Pulse: 62  Resp: 16  SpO2: 100%    Physical Exam Constitutional:      Appearance: She is well-developed.  HENT:     Head: Atraumatic.  Eyes:     Conjunctiva/sclera: Conjunctivae normal.  Cardiovascular:     Rate and Rhythm: Regular rhythm.  Pulmonary:      Effort: No respiratory distress.  Abdominal:     General: There is no distension.     Tenderness: There is generalized abdominal tenderness. There is no guarding or rebound. Negative signs include Murphy's sign.  Musculoskeletal:        General: Normal range of motion.     Cervical back: Normal range of motion.  Skin:    General: Skin is warm.  Neurological:     Mental Status: She is alert. Mental status is at baseline.     IMPRESSION / MDM / ASSESSMENT AND PLAN / ED COURSE  I reviewed the triage vital signs and the nursing notes.  Differential diagnosis including gastritis/PUD, AAA, mesenteric ischemia, symptomatic cholelithiasis, acute cholecystitis, pancreatitis, ACS, diverticulitis  EKG  I, Corena Herter, the attending physician, personally viewed and interpreted this ECG.  Significant artifact.  Sinus bradycardia.  Normal intervals.  No chamber enlargement.  No significant ST elevation or depression.  No signs of acute ischemia or dysrhythmia.    RADIOLOGY I independently reviewed imaging, my interpretation of imaging: CT abdomen and pelvis angiography with and without  contrast -aneurysm repair without an obvious leak.  No signs of bowel obstruction.  Read as no acute findings.  Cholelithiasis but no signs of acute cholecystitis  LABS (all labs ordered are listed, but only abnormal results are displayed) Labs interpreted as -    Labs Reviewed  CBC - Abnormal; Notable for the following components:      Result Value   RBC 3.85 (*)    MCV 100.5 (*)    All other components within normal limits  LIPASE, BLOOD  COMPREHENSIVE METABOLIC PANEL  URINALYSIS, ROUTINE W REFLEX MICROSCOPIC  TROPONIN I (HIGH SENSITIVITY)  TROPONIN I (HIGH SENSITIVITY)     MDM  Lab work overall reassuring with no significant leukocytosis or anemia.  Creatinine appears to be at her baseline.  Troponin is negative.  Normal lipase and LFTs.  Normal T. bili.  Given IV Pepcid, will follow-up with  CT imaging  On reevaluation states that her symptoms have completely resolved and not having any pain at this time.  CT imaging overall reassuring.  Lab work reassuring.  Clinical picture concerning for symptomatic cholelithiasis versus gastritis/duodenal ulcers.  Patient has a follow-up appointment with gastroenterology in January.  Discussed dietary changes.  Will start the patient on a PPI.  Discussed close follow-up with primary care physician.  Given information to follow-up with general surgery if she had recurrent episodes.  Discussed return to the emergency department if her symptoms return.     PROCEDURES:  Critical Care performed: No  Procedures  Patient's presentation is most consistent with acute presentation with potential threat to life or bodily function.   MEDICATIONS ORDERED IN ED: Medications  famotidine (PEPCID) IVPB 20 mg premix (0 mg Intravenous Stopped 10/18/23 1155)  iohexol (OMNIPAQUE) 350 MG/ML injection 75 mL (75 mLs Intravenous Contrast Given 10/18/23 1133)    FINAL CLINICAL IMPRESSION(S) / ED DIAGNOSES   Final diagnoses:  Epigastric pain  Calculus of gallbladder without cholecystitis without obstruction     Rx / DC Orders   ED Discharge Orders          Ordered    omeprazole (PRILOSEC OTC) 20 MG tablet  Daily        10/18/23 1313             Note:  This document was prepared using Dragon voice recognition software and may include unintentional dictation errors.   Corena Herter, MD 10/18/23 1316

## 2023-10-18 NOTE — ED Triage Notes (Signed)
Pt states upper abd pain all night long, pt holding her epigastric area and states that she still has her gallbladder

## 2023-10-23 ENCOUNTER — Encounter: Payer: Self-pay | Admitting: Internal Medicine

## 2023-10-31 ENCOUNTER — Other Ambulatory Visit (INDEPENDENT_AMBULATORY_CARE_PROVIDER_SITE_OTHER): Payer: Self-pay | Admitting: Vascular Surgery

## 2023-11-21 ENCOUNTER — Encounter
Admission: RE | Disposition: A | Discharge status: Home / Self Care | Payer: Self-pay | Source: Home / Self Care | Attending: Internal Medicine

## 2023-11-21 ENCOUNTER — Ambulatory Visit: Payer: 59 | Admitting: Anesthesiology

## 2023-11-21 ENCOUNTER — Ambulatory Visit
Admission: RE | Admit: 2023-11-21 | Discharge: 2023-11-21 | Disposition: A | Discharge status: Home / Self Care | Payer: 59 | Attending: Internal Medicine | Admitting: Internal Medicine

## 2023-11-21 DIAGNOSIS — D122 Benign neoplasm of ascending colon: Secondary | ICD-10-CM | POA: Insufficient documentation

## 2023-11-21 DIAGNOSIS — Z1211 Encounter for screening for malignant neoplasm of colon: Secondary | ICD-10-CM | POA: Diagnosis present

## 2023-11-21 DIAGNOSIS — I2510 Atherosclerotic heart disease of native coronary artery without angina pectoris: Secondary | ICD-10-CM | POA: Diagnosis not present

## 2023-11-21 DIAGNOSIS — F17200 Nicotine dependence, unspecified, uncomplicated: Secondary | ICD-10-CM | POA: Diagnosis not present

## 2023-11-21 DIAGNOSIS — K5730 Diverticulosis of large intestine without perforation or abscess without bleeding: Secondary | ICD-10-CM | POA: Insufficient documentation

## 2023-11-21 DIAGNOSIS — J439 Emphysema, unspecified: Secondary | ICD-10-CM | POA: Diagnosis not present

## 2023-11-21 DIAGNOSIS — K640 First degree hemorrhoids: Secondary | ICD-10-CM | POA: Insufficient documentation

## 2023-11-21 DIAGNOSIS — K219 Gastro-esophageal reflux disease without esophagitis: Secondary | ICD-10-CM | POA: Insufficient documentation

## 2023-11-21 DIAGNOSIS — I10 Essential (primary) hypertension: Secondary | ICD-10-CM | POA: Diagnosis not present

## 2023-11-21 DIAGNOSIS — I739 Peripheral vascular disease, unspecified: Secondary | ICD-10-CM | POA: Insufficient documentation

## 2023-11-21 HISTORY — PX: POLYPECTOMY: SHX5525

## 2023-11-21 HISTORY — PX: HEMOSTASIS CLIP PLACEMENT: SHX6857

## 2023-11-21 HISTORY — PX: COLONOSCOPY WITH PROPOFOL: SHX5780

## 2023-11-21 SURGERY — COLONOSCOPY WITH PROPOFOL
Anesthesia: General

## 2023-11-21 MED ORDER — PROPOFOL 500 MG/50ML IV EMUL
INTRAVENOUS | Status: DC | PRN
  Administered 2023-11-21: 75 ug/kg/min via INTRAVENOUS

## 2023-11-21 MED ORDER — SODIUM CHLORIDE 0.9 % IV SOLN
INTRAVENOUS | Status: DC
  Administered 2023-11-21: 20 mL/h via INTRAVENOUS

## 2023-11-21 MED ORDER — LIDOCAINE HCL (PF) 2 % IJ SOLN
INTRAMUSCULAR | Status: AC
  Filled 2023-11-21: qty 5

## 2023-11-21 MED ORDER — PROPOFOL 10 MG/ML IV BOLUS
INTRAVENOUS | Status: DC | PRN
  Administered 2023-11-21: 50 mg via INTRAVENOUS

## 2023-11-21 MED ORDER — PROPOFOL 1000 MG/100ML IV EMUL
INTRAVENOUS | Status: AC
  Filled 2023-11-21: qty 100

## 2023-11-21 MED ORDER — GLYCOPYRROLATE 0.2 MG/ML IJ SOLN
INTRAMUSCULAR | Status: AC
  Filled 2023-11-21: qty 1

## 2023-11-21 MED ORDER — LIDOCAINE HCL (CARDIAC) PF 100 MG/5ML IV SOSY
PREFILLED_SYRINGE | INTRAVENOUS | Status: DC | PRN
  Administered 2023-11-21: 30 mg via INTRAVENOUS

## 2023-11-21 NOTE — H&P (Signed)
 Outpatient short stay form Pre-procedure 11/21/2023 10:10 AM Autumn Le, M.D.  Primary Physician: Autumn Le, M.D.  Reason for visit:  Colon cancer screening  History of present illness:  Patient presents for colonoscopy for colon cancer screening. The patient denies complaints of abdominal pain, significant change in bowel habits, or rectal bleeding. Ms. Autumn Le presents to the [facility] GI clinic for follow-up of colon cancer screening and unintentional weight loss. She is hoping to schedule colonoscopy now. She hasn't had any major new health diagnoses since our first visit earlier this year. She is s/p epidermal inclusion cyst removal 9/27 last month with Dr. Rodolph without any complications. She follows up with Dr. Marea in Vascular Surgery tomorrow morning with repeat duplex ultrasound. She doesn't like all of the easy bruising she has had since being on the Clopidogrel . She is taking the blood thinner daily. She denies any overt gastrointestinal bleeding. Bowels are fairly regular, 2-3 times daily. She usually has a BM within 1-2 hours after eating. No further weight loss, she is steady around 82-lbs. She continues to smoke 1/2 ppd. She has no intentions of quitting. She denies any UGI symptoms such as nausea, vomiting, esophageal dysphagia, odynophagia, early satiety, hoarseness, or epigastric abdominal pain.       Current Facility-Administered Medications:    0.9 %  sodium chloride  infusion, , Intravenous, Continuous, Autumn Le, Autumn Reinard K, MD, Last Rate: 20 mL/hr at 11/21/23 0917, 20 mL/hr at 11/21/23 0917  Medications Prior to Admission  Medication Sig Dispense Refill Last Dose/Taking   acetaminophen  (TYLENOL ) 325 MG tablet Take 1-2 tablets (325-650 mg total) by mouth every 4 (four) hours as needed for mild pain (or temp >/= 101 F). 30 tablet 0 Past Week   albuterol  (VENTOLIN  HFA) 108 (90 Base) MCG/ACT inhaler Inhale 2 puffs into the lungs every 6 (six) hours as needed for  wheezing or shortness of breath.   Past Week   Ascorbic Acid  (VITAMIN C ) 1000 MG tablet Take 1,000 mg by mouth daily.   Past Week   ASPIRIN  LOW DOSE 81 MG tablet TAKE 1 TABLET (81 MG TOTAL) BY MOUTH DAILY AT 6 (SIX) AM. SWALLOW WHOLE. 90 tablet 4 Past Week   Calcium  Carbonate (CALCIUM  600 PO) Take 600 mg by mouth at bedtime.   Past Week   Docusate Calcium  (STOOL SOFTENER PO) Take 1-2 tablets by mouth daily.   Past Week   fluticasone  (FLONASE ) 50 MCG/ACT nasal spray Place 2 sprays into both nostrils as needed for allergies.   Past Week   fluticasone  furoate-vilanterol (BREO ELLIPTA ) 100-25 MCG/INH AEPB Inhale 1 puff into the lungs every morning.   Past Week   HYDROcodone -acetaminophen  (NORCO/VICODIN) 5-325 MG tablet Take 1 tablet by mouth every 4 (four) hours as needed for moderate pain. 20 tablet 0 Past Week   loratadine  (CLARITIN ) 10 MG tablet Take 10 mg by mouth at bedtime.   Past Week   Multiple Vitamins-Minerals (VITAMIN D3 COMPLETE PO) Take 1 tablet by mouth daily at 6 (six) AM.   Past Week   omeprazole  (PRILOSEC  OTC) 20 MG tablet Take 1 tablet (20 mg total) by mouth daily. 30 tablet 2 Past Week   rosuvastatin  (CRESTOR ) 5 MG tablet TAKE 1 TABLET (5 MG TOTAL) BY MOUTH DAILY. 90 tablet 1 Past Week   Cholecalciferol  50 MCG (2000 UT) CAPS Take 2,000 Units by mouth at bedtime.  (Patient not taking: Reported on 11/21/2023)   Not Taking   clopidogrel  (PLAVIX ) 75 MG tablet Take 75 mg by mouth  daily. (Patient not taking: Reported on 11/21/2023)   Not Taking   ezetimibe  (ZETIA ) 10 MG tablet Take 20 mg by mouth at bedtime.        Allergies  Allergen Reactions   Latex Hives    bandaids   Nickel Other (See Comments)    blisters     Past Medical History:  Diagnosis Date   AAA (abdominal aortic aneurysm) (HCC)    a.) aortic US  02/05/2020: infrarenal AAA measuring 4.2 x 4.6 cm; b.) AAA duplex 08/12/2020: measured 4.3 cm; c.) AAA duplex 02/23/2021: measured 4.53 x 4.31 cm; d.) AAA doppler 03/07/2022:  measured 4.53 x 4.31 cm; e.) LD chest CT 06/14/2022: measured 4.6 cm; f.) CT abd: partially thrombosed measuring up to 5.0 cm (overread by vascular suggested larger at 5.2-5.3 cm); g.) s/p EVAR 11/01/2022   Agitation    Anemia    Aneurysm of cavernous portion of left internal carotid artery 01/23/2020   a.) IR angio 01/23/2020: 8.0 x 5.6 mm wide necked aneurysm arising from the horizontal segment of the cavernous LEFT ICA   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   a.) IR angio 01/23/2020: 3.9 x 4.6 mm aneurysm at the bifurcation of the A2/A3 segment into the pericallosal and callosal marginal arteries; b.) s/p coli embolization 06/04/2020   Aortic atherosclerosis (HCC)    CAD (coronary artery disease)    a.) MV 10/31/2022: no ischemia   Compression fracture of L1 lumbar vertebra (HCC)    COPD (chronic obstructive pulmonary disease) (HCC)    DDD (degenerative disc disease), cervical    Degenerative joint disease of cervical spine    Diverticulosis    Emphysema lung (HCC)    Epidermoid cyst of neck    Eustachian tube dysfunction    GERD (gastroesophageal reflux disease)    HLD (hyperlipidemia)    HTN (hypertension)    Hypokalemia    Insomnia    Long-term use of aspirin  therapy    Lung nodule    Meniere's disease in remission, bilateral    On long term clopidogrel  therapy    Osteopenia    PAD (peripheral artery disease) (HCC)    a.) s/p PTA fo LEFT CFA and SFA 11/02/2022; b.) s/p LEFT femoral endarterectomy 11/03/2022   PAF (paroxysmal atrial fibrillation) (HCC)    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   a.) IR angio 01/23/2020: 6.2 x 6.1 x 8.9 ruptured aneurysm arising from the origion of the RIGHT PCA projecting laterally --> coil embolization performed 01/23/2020   Subarachnoid 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 (HCC) 01/23/2020   a.) CT head 01/23/2020: extensive SAH about the RIGHT cerebral hemisphere  and basal cisterns secondary to RIGHT PCA aneurysm rupture   Tobacco abuse    Unintentional weight loss    a.) reports eating well; eats 4-5 x/day and still feels hungry; b.) weight decreasing: 104 lb (02/2021), 88 lb (02/2022), 86 lb (08/2022), 83 lb (10/2022), 82 lb (07/2023)   Vertigo     Review of systems:  Otherwise negative.    Physical Exam  Gen: Alert, oriented. Appears stated age.  HEENT: [place]/AT. PERRLA. Lungs: CTA, no wheezes. CV: RR nl S1, S2. Abd: soft, benign, no masses. BS+ Ext: No edema. Pulses 2+    Planned procedures: Proceed with colonoscopy. The patient understands the nature of the planned procedure, indications, risks, alternatives and potential complications including but not limited to bleeding, infection, perforation, damage to internal organs and possible oversedation/side effects from anesthesia. The patient  agrees and gives consent to proceed.  Please refer to procedure notes for findings, recommendations and patient disposition/instructions.     Erandi Lemma Le. Le, M.D. Gastroenterology 11/21/2023  10:10 AM

## 2023-11-21 NOTE — Interval H&P Note (Signed)
 History and Physical Interval Note:  11/21/2023 10:11 AM  Autumn Le  has presented today for surgery, with the diagnosis of Z12.11 (ICD-10-CM) - Colon cancer screening.  The various methods of treatment have been discussed with the patient and family. After consideration of risks, benefits and other options for treatment, the patient has consented to  Procedure(s): COLONOSCOPY WITH PROPOFOL  (N/A) as a surgical intervention.  The patient's history has been reviewed, patient examined, no change in status, stable for surgery.  I have reviewed the patient's chart and labs.  Questions were answered to the patient's satisfaction.     Garrison, [name]

## 2023-11-21 NOTE — Transfer of Care (Signed)
 Immediate Anesthesia Transfer of Care Note  Patient: Autumn Le  Procedure(s) Performed: COLONOSCOPY WITH PROPOFOL  POLYPECTOMY HEMOSTASIS CLIP PLACEMENT  Patient Location: PACU  Anesthesia Type:General  Level of Consciousness: sedated  Airway & Oxygen Therapy: Patient Spontanous Breathing  Post-op Assessment: Report given to RN and Post -op Vital signs reviewed and stable  Post vital signs: Reviewed and stable  Last Vitals:  Vitals Value Taken Time  BP    Temp 35.9 C 11/21/23 1049  Pulse 52 11/21/23 1049  Resp 18 11/21/23 1049  SpO2      Last Pain:  Vitals:   11/21/23 1049  TempSrc: Temporal  PainSc: Asleep         Complications: No notable events documented.

## 2023-11-21 NOTE — Op Note (Signed)
 [facility] Gastroenterology Patient Name: Roberto Hlavaty Procedure Date: 11/21/2023 10:23 AM MRN: [id number] Account #: [id number] Date of Birth: [birth year] Admit Type: Outpatient Age: 78 Room: [facility] ENDO ROOM 2 Gender: Female Note Status: Finalized Instrument Name: Peds Colonoscope [id number] Procedure:             Colonoscopy Indications:           Screening for colorectal malignant neoplasm Providers:             Coleson Kant K. Mikylah Ackroyd MD, MD Medicines:             Propofol  per Anesthesia Complications:         No immediate complications. Estimated blood loss: None. Procedure:             Pre-Anesthesia Assessment:                        - The risks and benefits of the procedure and the                         sedation options and risks were discussed with the                         patient. All questions were answered and informed                         consent was obtained.                        - Patient identification and proposed procedure were                         verified prior to the procedure by the nurse. The                         procedure was verified in the procedure room.                        - ASA Grade Assessment: III - A patient with severe                         systemic disease.                        - After reviewing the risks and benefits, the patient                         was deemed in satisfactory condition to undergo the                         procedure.                        After obtaining informed consent, the colonoscope was                         passed under direct vision. Throughout the procedure,                         the patient's blood pressure, pulse, and oxygen  saturations were monitored continuously. The                         Colonoscope was introduced through the anus and                         advanced to the the cecum, identified by appendiceal                         orifice and  ileocecal valve. The colonoscopy was                         performed without difficulty. The patient tolerated                         the procedure well. The quality of the bowel                         preparation was good. The ileocecal valve, appendiceal                         orifice, and rectum were photographed. Findings:      The perianal and digital rectal examinations were normal. Pertinent       negatives include normal sphincter tone and no palpable rectal lesions.      Non-bleeding internal hemorrhoids were found during retroflexion. The       hemorrhoids were Grade I (internal hemorrhoids that do not prolapse).      Many small-mouthed diverticula were found in the sigmoid colon. There       was no evidence of diverticular bleeding.      A 12 mm polyp was found in the ascending colon. The polyp was sessile.       The polyp was removed with a piecemeal technique using a hot snare.       Resection and retrieval were complete. To prevent bleeding after the       polypectomy, one hemostatic clip was successfully placed (MR       conditional). Clip manufacturer: [employer]. There was no       bleeding during, or at the end, of the procedure.      The exam was otherwise without abnormality. Impression:            - Non-bleeding internal hemorrhoids.                        - Mild diverticulosis in the sigmoid colon. There was                         no evidence of diverticular bleeding.                        - One 12 mm polyp in the ascending colon, removed                         piecemeal using a hot snare. Resected and retrieved.                         Clip (MR conditional) was placed. Clip manufacturer:  [employer].                        - The examination was otherwise normal. Recommendation:        - Patient has a contact number available for                         emergencies. The signs and symptoms of potential                          delayed complications were discussed with the patient.                         Return to normal activities tomorrow. Written                         discharge instructions were provided to the patient.                        - Resume previous diet.                        - Continue present medications.                        - Repeat colonoscopy is recommended for surveillance.                         The colonoscopy date will be determined after                         pathology results from today's exam become available                         for review.                        - Return to GI office PRN.                        - The findings and recommendations were discussed with                         the patient. Procedure Code(s):     --- Professional ---                        [phone], Colonoscopy, flexible; with removal of                         tumor(s), polyp(s), or other lesion(s) by snare                         technique Diagnosis Code(s):     --- Professional ---                        K57.30, Diverticulosis of large intestine without                         perforation or abscess without bleeding  D12.2, Benign neoplasm of ascending colon                        K64.0, First degree hemorrhoids                        Z12.11, Encounter for screening for malignant neoplasm                         of colon CPT copyright 2022 [employer]. All rights reserved. The codes documented in this report are preliminary and upon coder review may  be revised to meet current compliance requirements. Ladell MARLA Boss MD, MD 11/21/2023 10:49:03 AM This report has been signed electronically. Number of Addenda: 0 Note Initiated On: 11/21/2023 10:23 AM Scope Withdrawal Time: 0 hours 10 minutes 22 seconds  Total Procedure Duration: 0 hours 16 minutes 18 seconds  Estimated Blood Loss:  Estimated blood loss: none.      [facility]

## 2023-11-21 NOTE — Anesthesia Postprocedure Evaluation (Signed)
 Anesthesia Post Note  Patient: Autumn Le  Procedure(s) Performed: COLONOSCOPY WITH PROPOFOL  POLYPECTOMY HEMOSTASIS CLIP PLACEMENT  Patient location during evaluation: Endoscopy Anesthesia Type: General Level of consciousness: awake and alert Pain management: pain level controlled Vital Signs Assessment: post-procedure vital signs reviewed and stable Respiratory status: spontaneous breathing, nonlabored ventilation, respiratory function stable and patient connected to nasal cannula oxygen Cardiovascular status: blood pressure returned to baseline and stable Postop Assessment: no apparent nausea or vomiting Anesthetic complications: no   No notable events documented.   Last Vitals:  Vitals:   11/21/23 1059 11/21/23 1109  BP: (!) 128/57 (!) 119/104  Pulse: (!) 56 (!) 48  Resp: 11 17  Temp:    SpO2: 100% 100%    Last Pain:  Vitals:   11/21/23 1059  TempSrc:   PainSc: 0-No pain                 [name]

## 2023-11-21 NOTE — Anesthesia Preprocedure Evaluation (Signed)
 Anesthesia Evaluation  Patient identified by MRN, date of birth, ID band Patient awake    Reviewed: Allergy & Precautions, NPO status , Patient's Chart, lab work & pertinent test results  Airway Mallampati: III  TM Distance: <3 FB Neck ROM: full    Dental  (+) Chipped, Poor Dentition, Partial Upper, Missing   Pulmonary shortness of breath and with exertion, COPD, Current Smoker and Patient abstained from smoking.   Pulmonary exam normal        Cardiovascular hypertension, (-) angina + CAD and + Peripheral Vascular Disease  Normal cardiovascular exam     Neuro/Psych negative neurological ROS  negative psych ROS   GI/Hepatic Neg liver ROS,GERD  Controlled,,  Endo/Other  negative endocrine ROS    Renal/GU negative Renal ROS  negative genitourinary   Musculoskeletal   Abdominal   Peds  Hematology negative hematology ROS (+)   Anesthesia Other Findings Past Medical History: No date: AAA (abdominal aortic aneurysm) (HCC)     Comment:  a.) aortic US  02/05/2020: infrarenal AAA measuring 4.2 x              4.6 cm; b.) AAA duplex 08/12/2020: measured 4.3 cm; c.)               AAA duplex 02/23/2021: measured 4.53 x 4.31 cm; d.) AAA               doppler 03/07/2022: measured 4.53 x 4.31 cm; e.) LD chest              CT 06/14/2022: measured 4.6 cm; f.) CT abd: partially               thrombosed measuring up to 5.0 cm (overread by vascular               suggested larger at 5.2-5.3 cm); g.) s/p EVAR 11/01/2022 No date: Agitation No date: Anemia 01/23/2020: 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     Comment:  a.) IR angio 01/23/2020: 8.0 x 5.6 mm wide necked               aneurysm arising from the horizontal segment of the               cavernous LEFT ICA 01/23/2020: Anterior communicating artery aneurysm     Comment:  a.) IR angio 01/23/2020: 3.9 x 4.6 mm aneurysm at the               bifurcation of  the A2/A3 segment into the pericallosal               and callosal marginal arteries; b.) s/p coli embolization              06/04/2020 No date: Aortic atherosclerosis (HCC) No date: CAD (coronary artery disease)     Comment:  a.) MV 10/31/2022: no ischemia No date: Compression fracture of L1 lumbar vertebra (HCC) No date: COPD (chronic obstructive pulmonary disease) (HCC) No date: DDD (degenerative disc disease), cervical No date: Degenerative joint disease of cervical spine No date: Diverticulosis No date: Emphysema lung (HCC) No date: Epidermoid cyst of neck No date: Eustachian tube dysfunction No date: GERD (gastroesophageal reflux disease) No date: HLD (hyperlipidemia) No date: HTN (hypertension) No date: Hypokalemia No date: Insomnia No date: Long-term use of aspirin  therapy No date: Lung nodule No date: Meniere's disease in remission, bilateral No date: On long term clopidogrel  therapy No date: Osteopenia No date: PAD (peripheral artery disease) (  HCC)     Comment:  a.) s/p PTA fo LEFT CFA and SFA 11/02/2022; b.) s/p LEFT              femoral endarterectomy 11/03/2022 No date: PAF (paroxysmal atrial fibrillation) (HCC) 01/23/2020: 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)     Comment:  a.) IR angio 01/23/2020: 6.2 x 6.1 x 8.9 ruptured               aneurysm arising from the origion of the RIGHT PCA               projecting laterally --> coil embolization performed               01/23/2020 01/23/2020: 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 [facility])     Comment:  a.) CT head 01/23/2020: extensive SAH about the RIGHT               cerebral hemisphere and basal cisterns secondary to RIGHT              PCA aneurysm rupture No date: Tobacco abuse No date: Unintentional weight loss     Comment:  a.) reports eating well; eats 4-5 x/day and still feels               hungry; b.) weight decreasing: 104 lb (02/2021), 88  lb               (02/2022), 86 lb (08/2022), 83 lb (10/2022), 82 lb               (07/2023) No date: Vertigo  Past Surgical History: 10/2022: ABDOMINAL AORTIC ANEURYSM REPAIR 2021: BRAIN SURGERY No date: CATARACT EXTRACTION W/ INTRAOCULAR LENS  IMPLANT, BILATERAL 08/10/2023: CYST EXCISION; N/A     Comment:  Procedure: CYST REMOVAL;  Surgeon: Rodolph Romano, MD;  Location: [facility] ORS;  Service: General;                Laterality: N/A; 11/03/2022: ENDARTERECTOMY FEMORAL; Left     Comment:  Procedure: ENDARTERECTOMY FEMORAL;  Surgeon: Marea Selinda RAMAN, MD;  Location: [facility] ORS;  Service: Vascular;                Laterality: Left; 10/2022: endocardectomy femerol 11/01/2022: ENDOVASCULAR REPAIR/STENT GRAFT; N/A     Comment:  Procedure: ENDOVASCULAR REPAIR/STENT GRAFT;  Surgeon:               Marea Selinda RAMAN, MD;  Location: [facility];                Service: Cardiovascular;  Laterality: N/A; No date: ENDOVASCULAR REPAIR/STENT GRAFT No date: EYE SURGERY 01/23/2020: IR ANGIO INTRA EXTRACRAN SEL INTERNAL CAROTID BILAT MOD  SED 06/04/2020: IR ANGIO INTRA EXTRACRAN SEL INTERNAL CAROTID UNI R MOD  SED 01/23/2020: IR ANGIO VERTEBRAL SEL VERTEBRAL UNI L MOD SED 01/23/2020: IR ANGIOGRAM FOLLOW UP STUDY 01/23/2020: IR ANGIOGRAM FOLLOW UP STUDY 01/23/2020: IR ANGIOGRAM FOLLOW UP STUDY 01/23/2020: IR ANGIOGRAM FOLLOW UP STUDY 06/04/2020: IR ANGIOGRAM FOLLOW UP STUDY 06/04/2020: IR ANGIOGRAM FOLLOW UP STUDY 06/04/2020: IR NEURO EACH ADD'L AFTER BASIC UNI RIGHT (MS) 01/23/2020: IR TRANSCATH/EMBOLIZ 06/04/2020: IR TRANSCATH/EMBOLIZ 06/04/2020: IR US  GUIDE VASC ACCESS RIGHT 11/02/2022: LOWER EXTREMITY ANGIOGRAPHY; Left     Comment:  Procedure: Lower Extremity Angiography;  Surgeon:  Marea Selinda RAMAN, MD;  Location: [facility];  Service:               Cardiovascular;  Laterality: Left; 06/23/2021: MYRINGOTOMY WITH TUBE PLACEMENT; Left      Comment:  Procedure: MYRINGOTOMY WITH TUBE PLACEMENT;  Surgeon:               Juengel, Paul, MD;  Location: [facility];                Service: ENT;  Laterality: Left; 01/23/2020: RADIOLOGY WITH ANESTHESIA; N/A     Comment:  Procedure: RADIOLOGY WITH ANESTHESIA;  Surgeon:               Lanis Pupa, MD;  Location: [facility] OR;  Service:               Radiology;  Laterality: N/A; 06/04/2020: RADIOLOGY WITH ANESTHESIA; N/A     Comment:  Procedure: Coil embolization of left ACA aneurysm;                Surgeon: Lanis Pupa, MD;  Location: [facility] OR;                Service: Radiology;  Laterality: N/A; No date: TONSILLECTOMY  BMI    Body Mass Index: 15.00 kg/m      Reproductive/Obstetrics negative OB ROS                             Anesthesia Physical Anesthesia Plan  ASA: 3  Anesthesia Plan: General   Post-op Pain Management:    Induction: Intravenous  PONV Risk Score and Plan: Propofol  infusion and TIVA  Airway Management Planned: Natural Airway and Nasal Cannula  Additional Equipment:   Intra-op Plan:   Post-operative Plan:   Informed Consent: I have reviewed the patients History and Physical, chart, labs and discussed the procedure including the risks, benefits and alternatives for the proposed anesthesia with the patient or authorized representative who has indicated his/her understanding and acceptance.     Dental Advisory Given  Plan Discussed with: Anesthesiologist, CRNA and Surgeon  Anesthesia Plan Comments: (Patient consented for risks of anesthesia including but not limited to:  - adverse reactions to medications - risk of airway placement if required - damage to eyes, teeth, lips or other oral mucosa - nerve damage due to positioning  - sore throat or hoarseness - Damage to heart, brain, nerves, lungs, other parts of body or loss of life  Patient voiced understanding and assent.)       Anesthesia Quick  Evaluation

## 2023-11-22 ENCOUNTER — Encounter: Payer: Self-pay | Admitting: Internal Medicine

## 2023-11-22 LAB — SURGICAL PATHOLOGY

## 2023-12-20 ENCOUNTER — Ambulatory Visit
Admission: RE | Admit: 2023-12-20 | Discharge: 2023-12-20 | Disposition: A | Discharge status: Home / Self Care | Payer: 59 | Source: Ambulatory Visit | Attending: Acute Care | Admitting: Acute Care

## 2023-12-20 DIAGNOSIS — R911 Solitary pulmonary nodule: Secondary | ICD-10-CM | POA: Insufficient documentation

## 2023-12-20 DIAGNOSIS — Z87891 Personal history of nicotine dependence: Secondary | ICD-10-CM | POA: Diagnosis present

## 2023-12-20 IMAGING — MG MM DIGITAL SCREENING BILAT W/ TOMO AND CAD
6 of 12 series · 6 of 36 positions shown · non-contrast
Comparison: Previous exam(s).

CLINICAL DATA: Screening.

EXAM:
DIGITAL SCREENING BILATERAL MAMMOGRAM WITH TOMOSYNTHESIS AND CAD
TECHNIQUE: Bilateral screening digital craniocaudal and mediolateral oblique
mammograms were obtained. Bilateral screening digital breast
tomosynthesis was performed. The images were evaluated with
computer-aided detection.

[L MLO synth-2D (1 of 2)]
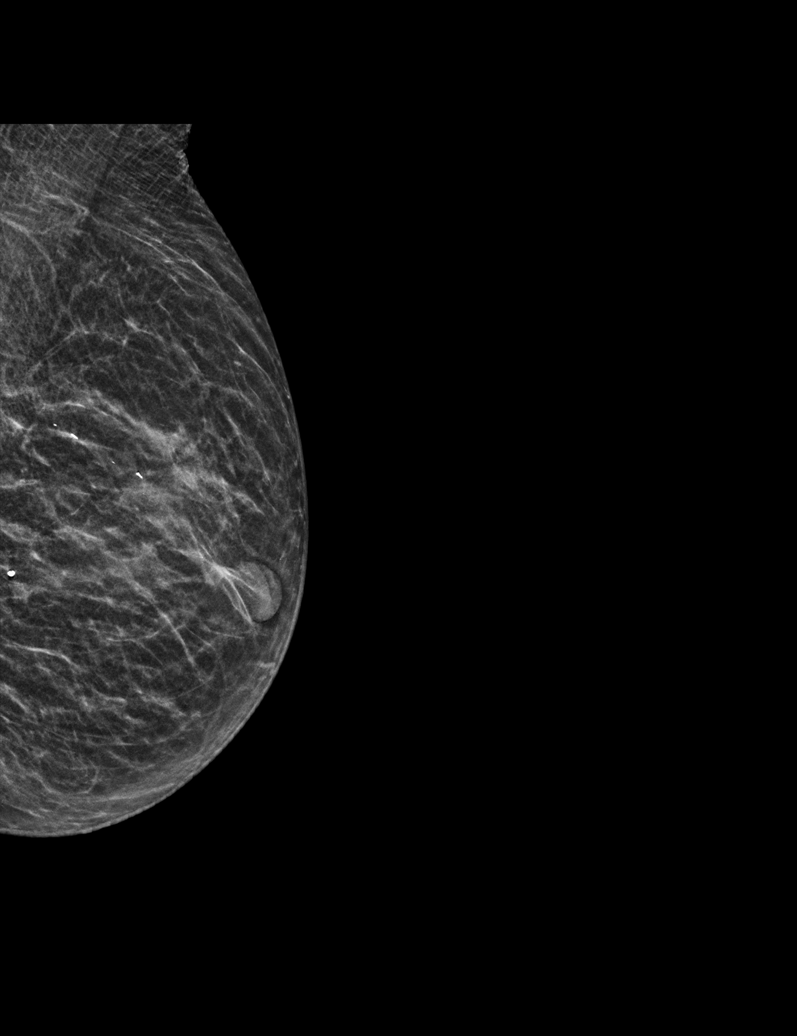

[L CC synth-2D]
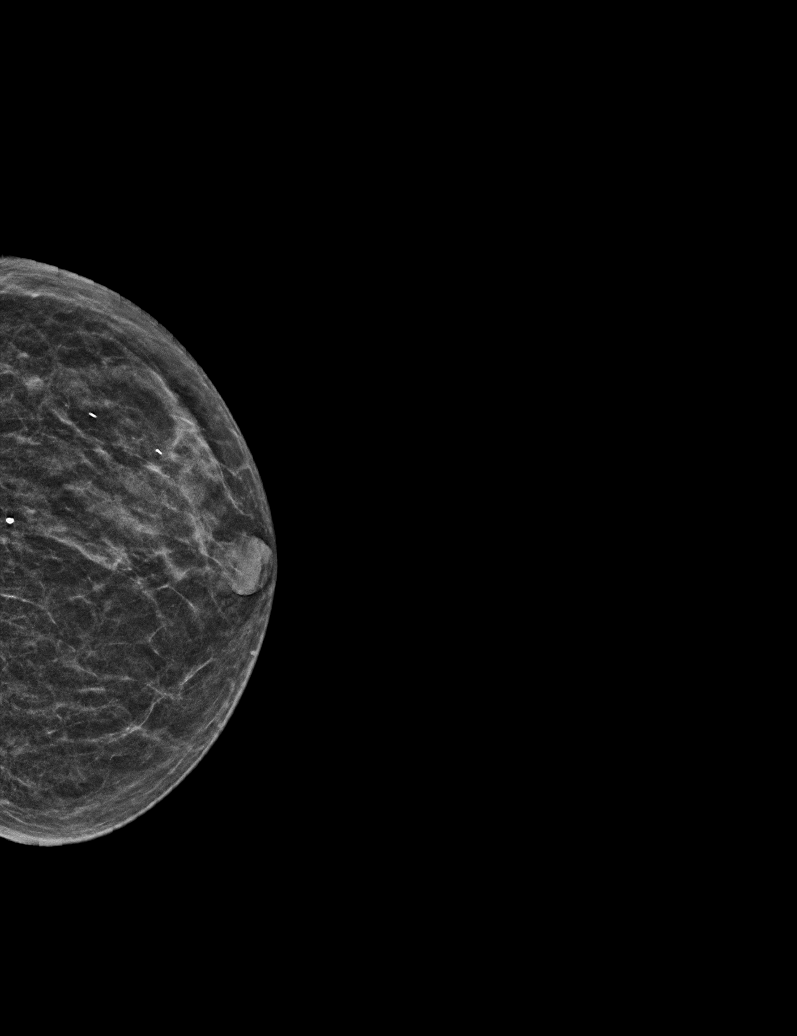

[R CC synth-2D]
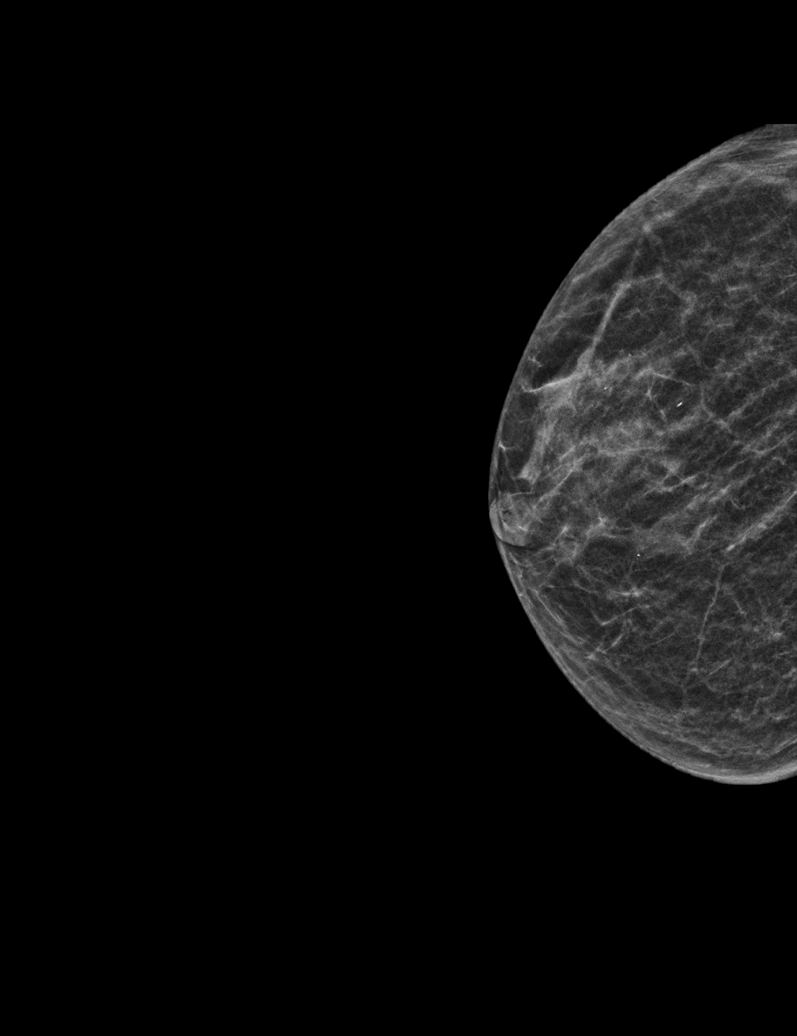

[L MLO synth-2D (2 of 2)]
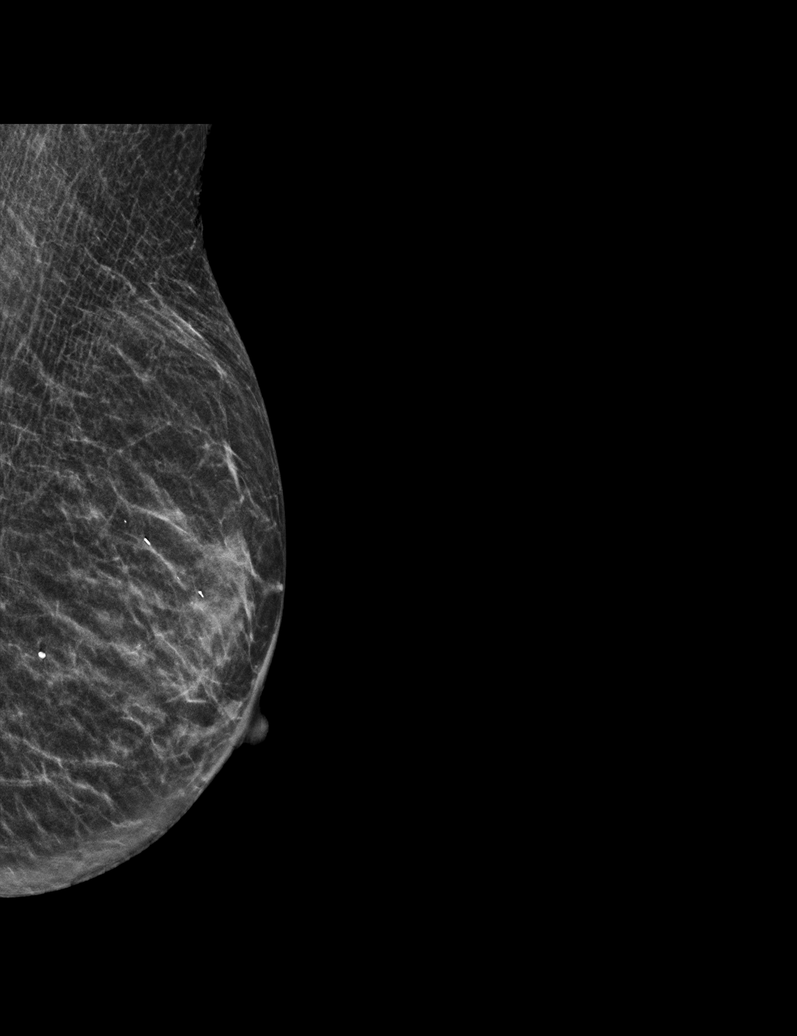

[R MLO synth-2D (1 of 2)]
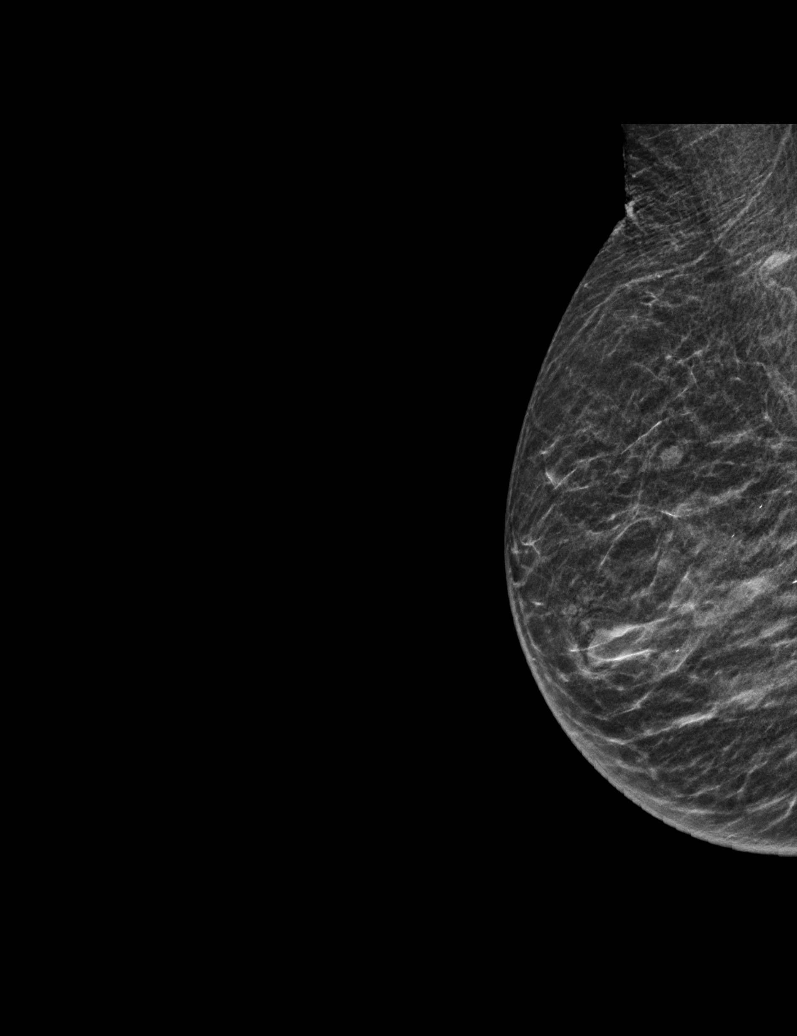

[R MLO synth-2D (2 of 2)]
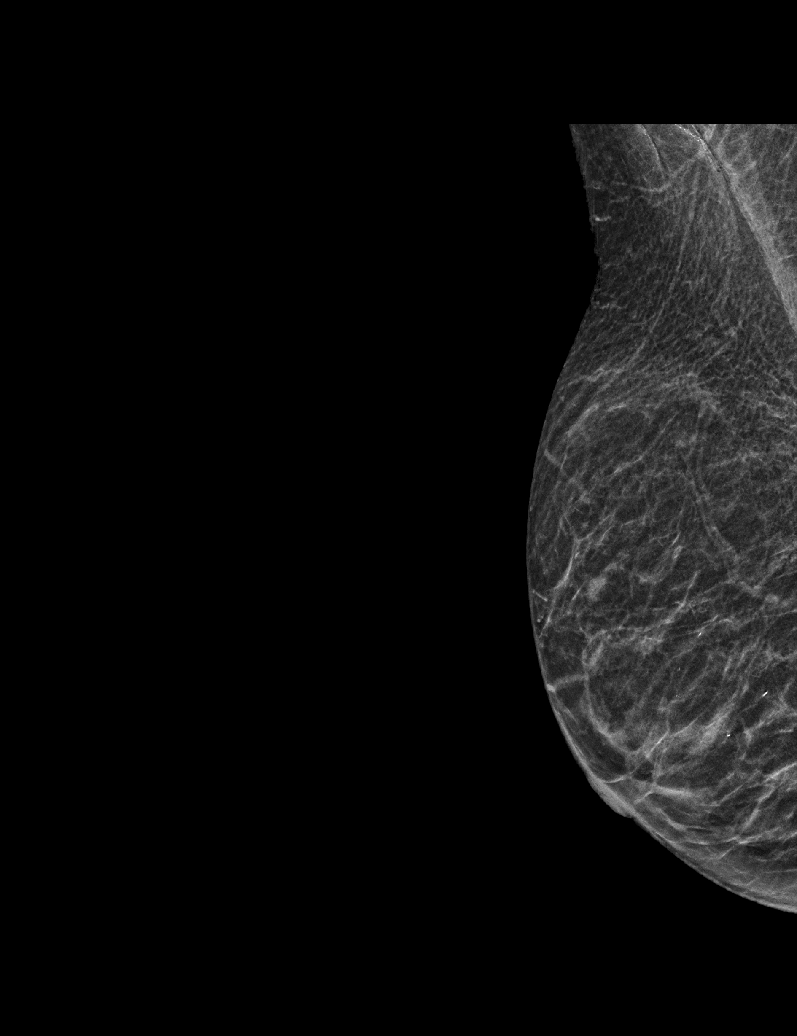

[6 of 36 positions shown; findings below may reference images not displayed]

ACR Breast Density Category c: The breast tissue is heterogeneously
dense, which may obscure small masses.
FINDINGS: In the right breast a mass requires further evaluation.

In the left breast an asymmetry requires further evaluation.
IMPRESSION: Further evaluation is suggested for possible mass in the right
breast.

Further evaluation is suggested for possible asymmetry in the left
breast.

RECOMMENDATION:
Diagnostic mammogram and possibly ultrasound of both breasts.
(Code:D5-S-77U)

The patient will be contacted regarding the findings, and additional
imaging will be scheduled.

BI-RADS CATEGORY  0: Incomplete. Need additional imaging evaluation
and/or prior mammograms for comparison.

## 2024-01-03 ENCOUNTER — Other Ambulatory Visit: Payer: Self-pay

## 2024-01-03 DIAGNOSIS — Z122 Encounter for screening for malignant neoplasm of respiratory organs: Secondary | ICD-10-CM

## 2024-01-03 DIAGNOSIS — F17210 Nicotine dependence, cigarettes, uncomplicated: Secondary | ICD-10-CM

## 2024-01-03 DIAGNOSIS — Z87891 Personal history of nicotine dependence: Secondary | ICD-10-CM

## 2024-03-04 ENCOUNTER — Encounter (INDEPENDENT_AMBULATORY_CARE_PROVIDER_SITE_OTHER): Payer: Self-pay | Admitting: Vascular Surgery

## 2024-03-04 ENCOUNTER — Ambulatory Visit (INDEPENDENT_AMBULATORY_CARE_PROVIDER_SITE_OTHER): Payer: 59

## 2024-03-04 ENCOUNTER — Ambulatory Visit (INDEPENDENT_AMBULATORY_CARE_PROVIDER_SITE_OTHER): Payer: 59 | Admitting: Vascular Surgery

## 2024-03-04 VITALS — BP 153/78 | HR 62 | Resp 16 | Wt 80.8 lb

## 2024-03-04 DIAGNOSIS — I10 Essential (primary) hypertension: Secondary | ICD-10-CM

## 2024-03-04 DIAGNOSIS — E782 Mixed hyperlipidemia: Secondary | ICD-10-CM

## 2024-03-04 DIAGNOSIS — I7143 Infrarenal abdominal aortic aneurysm, without rupture: Secondary | ICD-10-CM

## 2024-03-04 DIAGNOSIS — I70212 Atherosclerosis of native arteries of extremities with intermittent claudication, left leg: Secondary | ICD-10-CM | POA: Diagnosis not present

## 2024-03-04 NOTE — Progress Notes (Signed)
 MRN : [id number]  Autumn Le is a 78 y.o. (August 31, 1946) female who presents with chief complaint of  Chief Complaint  Patient presents with   Follow-up    6 month EVAR and abi follow up  .  History of Present Illness: Patient returns today in follow up of her abdominal aortic aneurysm and peripheral arterial disease.  She is doing quite well.  She is having no current lifestyle limiting claudication, ischemic rest pain, or ulceration.  She had concomitant iliac artery revascularization at the time of her aneurysm repair.  ABIs today are slightly down from previous but remains at 0.77 on the right with biphasic waveforms and digital pressure of 96 and 0.89 on the left with a biphasic waveform digit pressure of 108. EVAR duplex today shows a stable aortic sac size of 4.3 cm in maximal diameter.  There is no obvious endoleak.  Stent graft is patent with good flow through it.   Current Outpatient Medications  Medication Sig Dispense Refill   acetaminophen  (TYLENOL ) 325 MG tablet Take 1-2 tablets (325-650 mg total) by mouth every 4 (four) hours as needed for mild pain (or temp >/= 101 F). 30 tablet 0   albuterol  (VENTOLIN  HFA) 108 (90 Base) MCG/ACT inhaler Inhale 2 puffs into the lungs every 6 (six) hours as needed for wheezing or shortness of breath.     Ascorbic Acid  (VITAMIN C ) 1000 MG tablet Take 1,000 mg by mouth daily.     ASPIRIN  LOW DOSE 81 MG tablet TAKE 1 TABLET (81 MG TOTAL) BY MOUTH DAILY AT 6 (SIX) AM. SWALLOW WHOLE. 90 tablet 4   Calcium  Carbonate (CALCIUM  600 PO) Take 600 mg by mouth at bedtime.     Docusate Calcium  (STOOL SOFTENER PO) Take 1-2 tablets by mouth daily.     fluticasone  (FLONASE ) 50 MCG/ACT nasal spray Place 2 sprays into both nostrils as needed for allergies.     fluticasone  furoate-vilanterol (BREO ELLIPTA ) 100-25 MCG/INH AEPB Inhale 1 puff into the lungs every morning.     loratadine  (CLARITIN ) 10 MG tablet Take 10 mg by mouth at bedtime.     Multiple  Vitamins-Minerals (VITAMIN D3 COMPLETE PO) Take 1 tablet by mouth daily at 6 (six) AM.     Cholecalciferol  50 MCG (2000 UT) CAPS Take 2,000 Units by mouth at bedtime.  (Patient not taking: Reported on 11/21/2023)     clopidogrel  (PLAVIX ) 75 MG tablet Take 75 mg by mouth daily. (Patient not taking: Reported on 11/21/2023)     ezetimibe  (ZETIA ) 10 MG tablet Take 20 mg by mouth at bedtime.     omeprazole  (PRILOSEC  OTC) 20 MG tablet Take 1 tablet (20 mg total) by mouth daily. 30 tablet 2   rosuvastatin  (CRESTOR ) 5 MG tablet TAKE 1 TABLET (5 MG TOTAL) BY MOUTH DAILY. (Patient not taking: Reported on 03/04/2024) 90 tablet 1   No current facility-administered medications for this visit.    Past Medical History:  Diagnosis Date   AAA (abdominal aortic aneurysm) (HCC)    a.) aortic US  02/05/2020: infrarenal AAA measuring 4.2 x 4.6 cm; b.) AAA duplex 08/12/2020: measured 4.3 cm; c.) AAA duplex 02/23/2021: measured 4.53 x 4.31 cm; d.) AAA doppler 03/07/2022: measured 4.53 x 4.31 cm; e.) LD chest CT 06/14/2022: measured 4.6 cm; f.) CT abd: partially thrombosed measuring up to 5.0 cm (overread by vascular suggested larger at 5.2-5.3 cm); g.) s/p EVAR 11/01/2022   Agitation    Anemia    Aneurysm of cavernous portion  of left internal carotid artery 01/23/2020   a.) IR angio 01/23/2020: 8.0 x 5.6 mm wide necked aneurysm arising from the horizontal segment of the cavernous LEFT ICA   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   a.) IR angio 01/23/2020: 3.9 x 4.6 mm aneurysm at the bifurcation of the A2/A3 segment into the pericallosal and callosal marginal arteries; b.) s/p coli embolization 06/04/2020   Aortic atherosclerosis (HCC)    CAD (coronary artery disease)    a.) MV 10/31/2022: no ischemia   Compression fracture of L1 lumbar vertebra (HCC)    COPD (chronic obstructive pulmonary disease) (HCC)    DDD (degenerative disc disease), cervical    Degenerative joint disease of cervical spine     Diverticulosis    Emphysema lung (HCC)    Epidermoid cyst of neck    Eustachian tube dysfunction    GERD (gastroesophageal reflux disease)    HLD (hyperlipidemia)    HTN (hypertension)    Hypokalemia    Insomnia    Long-term use of aspirin  therapy    Lung nodule    Meniere's disease in remission, bilateral    On long term clopidogrel  therapy    Osteopenia    PAD (peripheral artery disease) (HCC)    a.) s/p PTA fo LEFT CFA and SFA 11/02/2022; b.) s/p LEFT femoral endarterectomy 11/03/2022   PAF (paroxysmal atrial fibrillation) (HCC)    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   a.) IR angio 01/23/2020: 6.2 x 6.1 x 8.9 ruptured aneurysm arising from the origion of the RIGHT PCA projecting laterally --> coil embolization performed 01/23/2020   Subarachnoid 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 (HCC) 01/23/2020   a.) CT head 01/23/2020: extensive SAH about the RIGHT cerebral hemisphere and basal cisterns secondary to RIGHT PCA aneurysm rupture   Tobacco abuse    Unintentional weight loss    a.) reports eating well; eats 4-5 x/day and still feels hungry; b.) weight decreasing: 104 lb (02/2021), 88 lb (02/2022), 86 lb (08/2022), 83 lb (10/2022), 82 lb (07/2023)   Vertigo     Past Surgical History:  Procedure Laterality Date   ABDOMINAL AORTIC ANEURYSM REPAIR  10/2022   BRAIN SURGERY  2021   CATARACT EXTRACTION W/ INTRAOCULAR LENS  IMPLANT, BILATERAL     COLONOSCOPY WITH PROPOFOL  N/A 11/21/2023   Procedure: COLONOSCOPY WITH PROPOFOL ;  Surgeon: Toledo, Alphonsus Jeans, MD;  Location: [facility];  Service: Gastroenterology;  Laterality: N/A;   CYST EXCISION N/A 08/10/2023   Procedure: CYST REMOVAL;  Surgeon: Eldred Grego, MD;  Location: [facility];  Service: General;  Laterality: N/A;   ENDARTERECTOMY FEMORAL Left 11/03/2022   Procedure: ENDARTERECTOMY FEMORAL;  Surgeon: Celso College, MD;  Location: [facility] OR[facility];  Service:  Vascular;  Laterality: Left;   endocardectomy femerol  10/2022   ENDOVASCULAR REPAIR/STENT GRAFT N/A 11/01/2022   Procedure: ENDOVASCULAR REPAIR/STENT GRAFT;  Surgeon: Celso College, MD;  Location: [facility];  Service: Cardiovascular;  Laterality: N/A;   ENDOVASCULAR REPAIR/STENT GRAFT     EYE SURGERY     HEMOSTASIS CLIP PLACEMENT  11/21/2023   Procedure: HEMOSTASIS CLIP PLACEMENT;  Surgeon: Corky Diener, Alphonsus Jeans, MD;  Location: [facility];  Service: Gastroenterology;;   IR ANGIO INTRA EXTRACRAN SEL INTERNAL CAROTID BILAT MOD SED  01/23/2020   IR ANGIO INTRA EXTRACRAN SEL INTERNAL CAROTID UNI R MOD SED  06/04/2020   IR ANGIO VERTEBRAL SEL VERTEBRAL UNI L MOD SED  01/23/2020   IR ANGIOGRAM FOLLOW UP  STUDY  01/23/2020   IR ANGIOGRAM FOLLOW UP STUDY  01/23/2020   IR ANGIOGRAM FOLLOW UP STUDY  01/23/2020   IR ANGIOGRAM FOLLOW UP STUDY  01/23/2020   IR ANGIOGRAM FOLLOW UP STUDY  06/04/2020   IR ANGIOGRAM FOLLOW UP STUDY  06/04/2020   IR NEURO EACH ADD'L AFTER BASIC UNI RIGHT (MS)  06/04/2020   IR TRANSCATH/EMBOLIZ  01/23/2020   IR TRANSCATH/EMBOLIZ  06/04/2020   IR US  GUIDE VASC ACCESS RIGHT  06/04/2020   LOWER EXTREMITY ANGIOGRAPHY Left 11/02/2022   Procedure: Lower Extremity Angiography;  Surgeon: Celso College, MD;  Location: [facility];  Service: Cardiovascular;  Laterality: Left;   MYRINGOTOMY WITH TUBE PLACEMENT Left 06/23/2021   Procedure: MYRINGOTOMY WITH TUBE PLACEMENT;  Surgeon: Mellody Sprout, MD;  Location: [facility];  Service: ENT;  Laterality: Left;   POLYPECTOMY  11/21/2023   Procedure: POLYPECTOMY;  Surgeon: Corky Diener, Alphonsus Jeans, MD;  Location: [facility] END[facility];  Service: Gastroenterology;;   RADIOLOGY WITH ANESTHESIA N/A 01/23/2020   Procedure: RADIOLOGY WITH ANESTHESIA;  Surgeon: Augusto Blonder, MD;  Location: [facility] OR;  Service: Radiology;  Laterality: N/A;   RADIOLOGY WITH ANESTHESIA N/A 06/04/2020   Procedure: Coil embolization of left ACA aneurysm;   Surgeon: Augusto Blonder, MD;  Location: [facility] OR;  Service: Radiology;  Laterality: N/A;   TONSILLECTOMY       Social History   Tobacco Use   Smoking status: Every Day    Current packs/day: 0.50    Average packs/day: 0.5 packs/day for 55.0 years (27.5 ttl pk-yrs)    Types: Cigarettes   Smokeless tobacco: Never  Vaping Use   Vaping status: Never Used  Substance Use Topics   Alcohol use: Not Currently    Comment: occasional glass of wine    Drug use: Not Currently      Family History  Problem Relation Age of Onset   Heart disease Mother    Breast cancer Maternal Aunt   No bleeding or clotting disorders  Allergies  Allergen Reactions   Latex Hives    bandaids   Nickel Other (See Comments)    blisters    REVIEW OF SYSTEMS (Negative unless checked)   Constitutional: [] Weight loss  [] Fever  [] Chills Cardiac: [] Chest pain   [] Chest pressure   [] Palpitations   [] Shortness of breath when laying flat   [x] Shortness of breath at rest   [x] Shortness of breath with exertion. Vascular:  [x] Pain in legs with walking   [] Pain in legs at rest   [] Pain in legs when laying flat   [] Claudication   [] Pain in feet when walking  [] Pain in feet at rest  [] Pain in feet when laying flat   [] History of DVT   [] Phlebitis   [] Swelling in legs   [] Varicose veins   [] Non-healing ulcers Pulmonary:   [] Uses home oxygen   [] Productive cough   [] Hemoptysis   [] Wheeze  [x] COPD   [] Asthma Neurologic:  [] Dizziness  [] Blackouts   [] Seizures   [] History of stroke   [] History of TIA  [] Aphasia   [] Temporary blindness   [] Dysphagia   [] Weakness or numbness in arms   [] Weakness or numbness in legs Musculoskeletal:  [x] Arthritis   [] Joint swelling   [] Joint pain   [] Low back pain Hematologic:  [x] Easy bruising  [] Easy bleeding   [] Hypercoagulable state   [x] Anemic   Gastrointestinal:  [] Blood in stool   [] Vomiting blood  [] Gastroesophageal reflux/heartburn   [] Abdominal pain Genitourinary:  [] Chronic kidney  disease   []   Difficult urination  [] Frequent urination  [] Burning with urination   [] Hematuria Skin:  [] Rashes   [] Ulcers   [] Wounds Psychological:  [] History of anxiety   []  History of major depression.  Physical Examination  BP (!) 153/78   Pulse 62   Resp 16   Wt 80 lb 12.8 oz (36.7 kg)   BMI 14.78 kg/m  Gen:  NAD, thin Head: [place]/AT, + temporalis wasting. Ear/Nose/Throat: Hearing grossly intact, nares w/o erythema or drainage Eyes: Conjunctiva clear. Sclera non-icteric Neck: Supple.  Trachea midline Pulmonary:  Good air movement, no use of accessory muscles.  Cardiac: RRR, no JVD Vascular:  Vessel Right Left  Radial Palpable Palpable                          PT Palpable Palpable  DP Palpable Palpable   Gastrointestinal: soft, non-tender/non-distended. No guarding/reflex.  Musculoskeletal: M/S 5/5 throughout.  No deformity or atrophy. No edema. Neurologic: Sensation grossly intact in extremities.  Symmetrical.  Speech is fluent.  Psychiatric: Judgment intact, Mood & affect appropriate for pt's clinical situation. Dermatologic: No rashes or ulcers noted.  No cellulitis or open wounds.     Labs No results found for this or any previous visit (from the past 2160 hours).  Radiology No results found.  Assessment/Plan  Atherosclerosis of native arteries of extremity with intermittent claudication (HCC) ABIs today are slightly down from previous but remains at 0.77 on the right with biphasic waveforms and digital pressure of 96 and 0.89 on the left with a biphasic waveform digit pressure of 108.  No worrisome symptoms at this point.  We will now check this annually.  AAA (abdominal aortic aneurysm) without rupture (HCC) EVAR duplex today shows a stable aortic sac size of 4.3 cm in maximal diameter.  There is no obvious endoleak.  Stent graft is patent with good flow through it.  She is doing well over a year status post endovascular abdominal aortic aneurysm repair.  We  can follow this on an annual basis.  Hyperlipidemia, mixed lipid control important in reducing the progression of atherosclerotic disease. Continue statin therapy     Essential hypertension blood pressure control important in reducing the progression of atherosclerotic disease and aneurysmal growth. On appropriate oral medications.  Mikki Alexander, MD  03/04/2024 11:30 AM    This note was created with Dragon medical transcription system.  Any errors from dictation are purely unintentional

## 2024-03-04 NOTE — Assessment & Plan Note (Signed)
 EVAR duplex today shows a stable aortic sac size of 4.3 cm in maximal diameter.  There is no obvious endoleak.  Stent graft is patent with good flow through it.  She is doing well over a year status post endovascular abdominal aortic aneurysm repair.  We can follow this on an annual basis.

## 2024-03-04 NOTE — Assessment & Plan Note (Signed)
 ABIs today are slightly down from previous but remains at 0.77 on the right with biphasic waveforms and digital pressure of 96 and 0.89 on the left with a biphasic waveform digit pressure of 108.  No worrisome symptoms at this point.  We will now check this annually.

## 2024-03-05 LAB — VAS US ABI WITH/WO TBI
Left ABI: 0.89
Right ABI: 0.77

## 2024-04-01 ENCOUNTER — Encounter (INDEPENDENT_AMBULATORY_CARE_PROVIDER_SITE_OTHER): Payer: Self-pay

## 2024-05-12 ENCOUNTER — Other Ambulatory Visit: Payer: Self-pay | Admitting: Family Medicine

## 2024-05-12 DIAGNOSIS — Z1231 Encounter for screening mammogram for malignant neoplasm of breast: Secondary | ICD-10-CM

## 2024-06-02 ENCOUNTER — Ambulatory Visit
Admission: RE | Admit: 2024-06-02 | Discharge: 2024-06-02 | Disposition: A | Discharge status: Home / Self Care | Source: Ambulatory Visit | Attending: Family Medicine | Admitting: Family Medicine

## 2024-06-02 DIAGNOSIS — Z1231 Encounter for screening mammogram for malignant neoplasm of breast: Secondary | ICD-10-CM | POA: Insufficient documentation

## 2024-08-05 ENCOUNTER — Ambulatory Visit: Payer: Self-pay | Admitting: General Surgery

## 2024-08-05 NOTE — H&P (View-Only) (Signed)
 History of Present Illness Autumn Le is a 78 year old female who presents with a cyst on her back causing discomfort when sitting.  She has had a cyst on her back for approximately one year, initially estimating the duration as six months but later clarifying it has likely been present for a year. The cyst becomes painful and hard, particularly after prolonged sitting, such as during a two-hour drive, which exacerbates the discomfort. Pain is alleviated when lying down, which is more comfortable than sitting.  The cyst has not drained but fluctuates between being hard and painful to softer. She uses a gel pad at home and in her car to mitigate discomfort while sitting. Flare-ups occur every three to four months, and she experiences minimal pain at the time of the visit.  The cyst's location near the coccyx bone causes significant discomfort during prolonged sitting, impacting her ability to travel. She recently missed a family reunion due to the anticipated pain from a five-hour drive. The pain affects her daily activities, making it difficult to sit or get up, and she finds relief only when lying down.  Current management includes using gel pads to reduce pressure while sitting.      PAST MEDICAL HISTORY:  Past Medical History:  Diagnosis Date  . AAA (abdominal aortic aneurysm) ()   . Atherosclerosis of native arteries of extremity with intermittent claudication () 11/28/2022   Had to undergo a left femoral endarterectomy for severe left common femoral disease after her aneurysm repair. Left leg is doing well. Incision is healed. Continue to increase her activity and is doing physical therapy. Recheck with ABIs in 3 months.  . Degenerative cervical disc 11/30/2015  . Emphysema of lung (CMS/HHS-HCC)   . Hyperlipidemia   . Hypertension   . Lung nodule 02/14/2016   CT scan 10/2015 Left Lung apex Fu in 12 months with CT scan  . Meniere's disease of both ears 06/23/2016   Seeing Dr.  Balinda  . Osteopenia   . Other emphysema (CMS/HHS-HCC) 02/14/2016   Mild to moderate reduction in the DLCO. Mild obstructive ventilatory defect by spiorometery  . Ruptured cerebral aneurysm (CMS/HHS-HCC) 01/31/2020  . Tobacco abuse 11/30/2015   1 ppd for 50 years.        PAST SURGICAL HISTORY:   Past Surgical History:  Procedure Laterality Date  . Colon @ [facility]  11/21/2023   Sessile serrated polyp/Repeat 21yrs/TKT  . BRAIN SURGERY 11/14/2019 - 11/12/2020     . ENDARTERECTOMY FEMORAL 11/03/2022 Leg Upper/Left    . ENDOVASCULAR REPAIR/STENT GRAFT 11/01/2022 N/A    . LOWER EXTREMITY ANGIOGRAPHY 11/02/2022 Left    . MYRINGOTOMY WITH TUBE PLACEMENT 06/23/2021 Ear/Left    . TONSILLECTOMY           MEDICATIONS:  Outpatient Encounter Medications as of 08/05/2024  Medication Sig Dispense Refill  . acetaminophen  (TYLENOL ) 325 MG tablet Take 650 mg by mouth every 6 (six) hours as needed for Pain       . albuterol  90 mcg/actuation inhaler Inhale 2 inhalations into the lungs every 6 (six) hours as needed       . alendronate (FOSAMAX) 70 MG tablet One tab every Sunday on empty stomach with a full glass of water . Do not lie down or eat/drink anything else for the next 30 min. 12 tablet 3  . amLODIPine  (NORVASC ) 5 MG tablet Take 1 tablet (5 mg total) by mouth once daily 90 tablet 1  . CALCIUM  CITRATE ORAL Take 1 tablet by  mouth once daily 1200 mg    . cholecalciferol  (VITAMIN D3) 2,000 unit capsule Take 2,000 Units by mouth once daily       . fluticasone  furoate-vilanteroL (BREO ELLIPTA ) 100-25 mcg/dose DsDv inhaler INHALE 1 PUFF INTO LUNGS ONCE DAILY. 200 each 1  . fluticasone  propionate (FLONASE ) 50 mcg/actuation nasal spray Place 2 sprays into both nostrils once daily as needed    . Herbal Supplement Herbal Name: tumeric,berberine, circulation tablet    . loratadine  (CLARITIN ) 10 mg tablet Take 1 tablet (10 mg total) by mouth once daily 90 tablet 1   No facility-administered encounter medications  on file as of 08/05/2024.     ALLERGIES:   Latex, Nickel, and Yeast   SOCIAL HISTORY:  Social History   Socioeconomic History  . Marital status: Single  Occupational History  . Occupation: Retired   Tobacco Use  . Smoking status: Every Day    Types: Cigarettes    Passive exposure: Current  . Smokeless tobacco: Never  Vaping Use  . Vaping status: Never Used  Substance and Sexual Activity  . Alcohol use: Not Currently  . Drug use: Never  . Sexual activity: Defer  Social History Narrative   Religious Affiliation: [employer])   Social Drivers of Health   Financial Resource Strain: Low Risk  (12/14/2023)   Overall Financial Resource Strain (CARDIA)   . Difficulty of Paying Living Expenses: Not hard at all  Food Insecurity: No Food Insecurity (12/14/2023)   Hunger Vital Sign   . Worried About Programme researcher, broadcasting/film/video in the Last Year: Never true   . Ran Out of Food in the Last Year: Never true  Transportation Needs: No Transportation Needs (12/14/2023)   PRAPARE - Transportation   . Lack of Transportation (Medical): No   . Lack of Transportation (Non-Medical): No    FAMILY HISTORY:  Family History  Problem Relation Name Age of Onset  . Coronary Artery Disease (Blocked arteries around heart) Mother    . Coronary Artery Disease (Blocked arteries around heart) Father    . Dementia Brother       GENERAL REVIEW OF SYSTEMS:   General ROS: negative for - chills, fatigue, fever, weight gain or weight loss Allergy and Immunology ROS: negative for - hives  Hematological and Lymphatic ROS: negative for - bleeding problems or bruising, negative for palpable nodes Endocrine ROS: negative for - heat or cold intolerance, hair changes Respiratory ROS: negative for - cough, shortness of breath or wheezing Cardiovascular ROS: no chest pain or palpitations GI ROS: negative for nausea, vomiting, abdominal pain, diarrhea, constipation Musculoskeletal ROS: negative for - joint swelling  or muscle pain Neurological ROS: negative for - confusion, syncope Dermatological ROS: negative for pruritus and rash  PHYSICAL EXAM:  Vitals:   08/05/24 1359  BP: 122/70  Pulse: 82  .  Ht:154.9 cm (5' 1) Wt:(!) 37.2 kg (82 lb) ADJ:Anib surface area is 1.27 meters squared. Body mass index is 15.49 kg/m.Autumn Le   GENERAL: Alert, active, oriented x3  HEENT: Pupils equal reactive to light. Extraocular movements are intact. Sclera clear. Palpebral conjunctiva normal red color.Pharynx clear.  NECK: Supple with no palpable mass and no adenopathy.  LUNGS: Sound clear with no rales rhonchi or wheezes.  HEART: Regular rhythm S1 and S2 without murmur.  ABDOMEN: Soft and depressible, nontender with no palpable mass, no hepatomegaly.  BACK: There is a 3.5 area of induration at the coccyx area.  This is tender to palpation.  Round, mobile.  No  erythema.  No sign of fluid collection.  EXTREMITIES: Well-developed well-nourished symmetrical with no dependent edema.  NEUROLOGICAL: Awake alert oriented, facial expression symmetrical, moving all extremities.   Assessment & Plan Epidermal inclusion cyst of sacrococcygeal region   A chronic epidermal inclusion cyst in the sacrococcygeal region is likely worsened by prolonged sitting and coccyx pressure. She experiences intermittent hardening and pain, especially after long periods of sitting, such as during car rides. There are no signs of infection or drainage, but significant discomfort affects her quality of life, causing her to miss family events due to anticipated flare-ups. Although a pilonidal cyst was considered, examination suggests a simple epidermal inclusion cyst. Surgical excision is recommended due to its impact on daily activities and quality of life. Risks include delayed healing due to the anatomical location and coccyx pressure, but long-term benefits of cyst removal are anticipated. Schedule surgical excision in the hospital with sedation  and local anesthesia. Advise avoiding prolonged sitting post-operatively and alternating sitting positions to reduce pressure on the surgical site. Discuss potential for delayed healing but emphasize the long-term benefits of cyst removal.   Epidermal inclusion cyst [L72.0]          Patient verbalized understanding, all questions were answered, and were agreeable with the plan outlined above.   Lucas Sjogren, MD  Electronically signed by Lucas Sjogren, MD

## 2024-08-08 ENCOUNTER — Encounter
Admission: RE | Admit: 2024-08-08 | Discharge: 2024-08-08 | Disposition: A | Discharge status: Home / Self Care | Source: Ambulatory Visit | Attending: General Surgery | Admitting: General Surgery

## 2024-08-08 ENCOUNTER — Other Ambulatory Visit: Payer: Self-pay

## 2024-08-08 VITALS — Ht 61.0 in | Wt 82.0 lb

## 2024-08-08 DIAGNOSIS — I10 Essential (primary) hypertension: Secondary | ICD-10-CM

## 2024-08-08 DIAGNOSIS — L720 Epidermal cyst: Secondary | ICD-10-CM

## 2024-08-08 DIAGNOSIS — J438 Other emphysema: Secondary | ICD-10-CM

## 2024-08-08 DIAGNOSIS — Z01810 Encounter for preprocedural cardiovascular examination: Secondary | ICD-10-CM

## 2024-08-08 DIAGNOSIS — I714 Abdominal aortic aneurysm, without rupture, unspecified: Secondary | ICD-10-CM

## 2024-08-08 HISTORY — DX: Epidermal cyst: L72.0

## 2024-08-08 NOTE — Patient Instructions (Addendum)
 Your procedure is scheduled on:08-13-24 Wednesday Report to the Registration Desk on the 1st floor of the Medical Mall.Then proceed to the 2nd floor Surgery Desk To find out your arrival time, please call [phone] between 1PM - 3PM on:08-12-24 Tuesday If your arrival time is 6:00 am, do not arrive before that time as the Medical Mall entrance doors do not open until 6:00 am.  REMEMBER: Instructions that are not followed completely may result in serious medical risk, up to and including death; or upon the discretion of your surgeon and anesthesiologist your surgery may need to be rescheduled.  Do not eat food OR drink liquids after midnight the night before surgery.  No gum chewing or hard candies.  One week prior to surgery:Stop NOW (08-08-24) Stop Anti-inflammatories (NSAIDS) such as Advil , Aleve, Ibuprofen , Motrin , Naproxen, Naprosyn and Aspirin  based products such as Excedrin, Goody's Powder, BC Powder. Stop ANY OVER THE COUNTER supplements until after surgery (Vitamin C , Calcium , Vitamin D3, Berberine, Circulation Accelerator)  You may however, continue to take Tylenol  if needed for pain up until the day of surgery.  Continue taking all of your other prescription medications up until the day of surgery.  ON THE DAY OF SURGERY ONLY TAKE THESE MEDICATIONS WITH SIPS OF WATER : -amLODipine  (NORVASC )   No Alcohol for 24 hours before or after surgery.  No Smoking including e-cigarettes for 24 hours before surgery.  No chewable tobacco products for at least 6 hours before surgery.  No nicotine  patches on the day of surgery.  Do not use any recreational drugs for at least a week (preferably 2 weeks) before your surgery.  Please be advised that the combination of cocaine and anesthesia may have negative outcomes, up to and including death. If you test positive for cocaine, your surgery will be cancelled.  On the morning of surgery brush your teeth with toothpaste and water , you may  rinse your mouth with mouthwash if you wish. Do not swallow any toothpaste or mouthwash.  Use CHG Soap as directed on instruction sheet.  Do not wear jewelry, make-up, hairpins, clips or nail polish.  For welded (permanent) jewelry: bracelets, anklets, waist bands, etc.  Please have this removed prior to surgery.  If it is not removed, there is a chance that hospital personnel will need to cut it off on the day of surgery.  Do not wear lotions, powders, or perfumes.   Do not shave body hair from the neck down 48 hours before surgery.  Contact lenses, hearing aids and dentures may not be worn into surgery.  Do not bring valuables to the hospital. [facility] is not responsible for any missing/lost belongings or valuables.   Notify your doctor if there is any change in your medical condition (cold, fever, infection).  Wear comfortable clothing (specific to your surgery type) to the hospital.  After surgery, you can help prevent lung complications by doing breathing exercises.  Take deep breaths and cough every 1-2 hours. Your doctor may order a device called an Incentive Spirometer to help you take deep breaths. When coughing or sneezing, hold a pillow firmly against your incision with both hands. This is called "splinting." Doing this helps protect your incision. It also decreases belly discomfort.  If you are being admitted to the hospital overnight, leave your suitcase in the car. After surgery it may be brought to your room.  In case of increased patient census, it may be necessary for you, the patient, to continue your postoperative care in  the Same Day Surgery department.  If you are being discharged the day of surgery, you will not be allowed to drive home. You will need a responsible individual to drive you home and stay with you for 24 hours after surgery.   If you are taking public transportation, you will need to have a responsible individual with you.  Please call the  Pre-admissions Testing Dept. at [phone] if you have any questions about these instructions.  Surgery Visitation Policy:  Patients having surgery or a procedure may have two visitors.  Children under the age of 35 must have an adult with them who is not the patient.                                                                                                             Preparing for Surgery with CHLORHEXIDINE  GLUCONATE (CHG) Soap  Chlorhexidine  Gluconate (CHG) Soap  o An antiseptic cleaner that kills germs and bonds with the skin to continue killing germs even after washing  o Used for showering the night before surgery and morning of surgery  Before surgery, you can play an important role by reducing the number of germs on your skin.  CHG (Chlorhexidine  gluconate) soap is an antiseptic cleanser which kills germs and bonds with the skin to continue killing germs even after washing.  Please do not use if you have an allergy to CHG or antibacterial soaps. If your skin becomes reddened/irritated stop using the CHG.  1. Shower the NIGHT BEFORE SURGERY and the MORNING OF SURGERY with CHG soap.  2. If you choose to wash your hair, wash your hair first as usual with your normal shampoo.  3. After shampooing, rinse your hair and body thoroughly to remove the shampoo.  4. Use CHG as you would any other liquid soap. You can apply CHG directly to the skin and wash gently with a scrungie or a clean washcloth.  5. Apply the CHG soap to your body only from the neck down. Do not use on open wounds or open sores. Avoid contact with your eyes, ears, mouth, and genitals (private parts). Wash face and genitals (private parts) with your normal soap.  6. Wash thoroughly, paying special attention to the area where your surgery will be performed.  7. Thoroughly rinse your body with warm water .  8. Do not shower/wash with your normal soap after using and rinsing off the CHG soap.  9. Pat  yourself dry with a clean towel.  10. Wear clean pajamas to bed the night before surgery.  12. Place clean sheets on your bed the night of your first shower and do not sleep with pets.  13. Shower again with the CHG soap on the day of surgery prior to arriving at the hospital.  14. Do not apply any deodorants/lotions/powders.  15. Please wear clean clothes to the hospital.   [employer] to address health-related social needs:  [email or web address]

## 2024-08-11 ENCOUNTER — Encounter
Admission: RE | Admit: 2024-08-11 | Discharge: 2024-08-11 | Disposition: A | Discharge status: Home / Self Care | Source: Ambulatory Visit | Attending: General Surgery | Admitting: General Surgery

## 2024-08-11 DIAGNOSIS — Z01818 Encounter for other preprocedural examination: Secondary | ICD-10-CM | POA: Diagnosis present

## 2024-08-11 DIAGNOSIS — Z01810 Encounter for preprocedural cardiovascular examination: Secondary | ICD-10-CM | POA: Insufficient documentation

## 2024-08-11 DIAGNOSIS — R9431 Abnormal electrocardiogram [ECG] [EKG]: Secondary | ICD-10-CM | POA: Insufficient documentation

## 2024-08-11 DIAGNOSIS — I714 Abdominal aortic aneurysm, without rupture, unspecified: Secondary | ICD-10-CM | POA: Insufficient documentation

## 2024-08-11 DIAGNOSIS — I10 Essential (primary) hypertension: Secondary | ICD-10-CM | POA: Insufficient documentation

## 2024-08-11 DIAGNOSIS — J438 Other emphysema: Secondary | ICD-10-CM | POA: Insufficient documentation

## 2024-08-13 ENCOUNTER — Other Ambulatory Visit: Payer: Self-pay

## 2024-08-13 ENCOUNTER — Encounter: Payer: Self-pay | Admitting: General Surgery

## 2024-08-13 ENCOUNTER — Ambulatory Visit: Admitting: Urgent Care

## 2024-08-13 ENCOUNTER — Ambulatory Visit: Admitting: Anesthesiology

## 2024-08-13 ENCOUNTER — Encounter
Admission: RE | Disposition: A | Discharge status: Home / Self Care | Payer: Self-pay | Source: Home / Self Care | Attending: General Surgery

## 2024-08-13 ENCOUNTER — Other Ambulatory Visit: Payer: Self-pay | Admitting: General Surgery

## 2024-08-13 ENCOUNTER — Ambulatory Visit
Admission: RE | Admit: 2024-08-13 | Discharge: 2024-08-13 | Disposition: A | Discharge status: Home / Self Care | Attending: General Surgery | Admitting: General Surgery

## 2024-08-13 DIAGNOSIS — L720 Epidermal cyst: Secondary | ICD-10-CM | POA: Insufficient documentation

## 2024-08-13 DIAGNOSIS — K219 Gastro-esophageal reflux disease without esophagitis: Secondary | ICD-10-CM | POA: Insufficient documentation

## 2024-08-13 DIAGNOSIS — I4891 Unspecified atrial fibrillation: Secondary | ICD-10-CM | POA: Insufficient documentation

## 2024-08-13 DIAGNOSIS — I2510 Atherosclerotic heart disease of native coronary artery without angina pectoris: Secondary | ICD-10-CM | POA: Insufficient documentation

## 2024-08-13 DIAGNOSIS — F17200 Nicotine dependence, unspecified, uncomplicated: Secondary | ICD-10-CM | POA: Diagnosis not present

## 2024-08-13 DIAGNOSIS — J449 Chronic obstructive pulmonary disease, unspecified: Secondary | ICD-10-CM | POA: Insufficient documentation

## 2024-08-13 DIAGNOSIS — I10 Essential (primary) hypertension: Secondary | ICD-10-CM | POA: Insufficient documentation

## 2024-08-13 HISTORY — PX: EXCISION MASS ABDOMINAL: SHX6701

## 2024-08-13 SURGERY — EXCISION, MASS, TORSO
Anesthesia: Monitor Anesthesia Care | Site: Buttocks

## 2024-08-13 MED ORDER — LACTATED RINGERS IV SOLN
INTRAVENOUS | Status: DC
Start: 2024-08-13 — End: 2024-08-13

## 2024-08-13 MED ORDER — FENTANYL CITRATE (PF) 100 MCG/2ML IJ SOLN: 25.0000 ug | INTRAMUSCULAR | Status: DC | PRN

## 2024-08-13 MED ORDER — DEXAMETHASONE SODIUM PHOSPHATE 10 MG/ML IJ SOLN
INTRAMUSCULAR | Status: DC | PRN
  Administered 2024-08-13: 5 mg via INTRAVENOUS

## 2024-08-13 MED ORDER — TRAMADOL HCL 50 MG PO TABS
50.0000 mg | ORAL_TABLET | Freq: Four times a day (QID) | ORAL | 0 refills | Status: AC | PRN
  Filled 2024-08-13: qty 5, 2d supply, fill #0

## 2024-08-13 MED ORDER — ONDANSETRON HCL 4 MG/2ML IJ SOLN
INTRAMUSCULAR | Status: DC | PRN
  Administered 2024-08-13: 4 mg via INTRAVENOUS

## 2024-08-13 MED ORDER — CEFAZOLIN SODIUM-DEXTROSE 2-4 GM/100ML-% IV SOLN
INTRAVENOUS | Status: AC
  Filled 2024-08-13: qty 100

## 2024-08-13 MED ORDER — OXYCODONE HCL 5 MG/5ML PO SOLN: 5.0000 mg | Freq: Once | ORAL | Status: DC | PRN

## 2024-08-13 MED ORDER — CEFAZOLIN SODIUM-DEXTROSE 2-4 GM/100ML-% IV SOLN
2.0000 g | INTRAVENOUS | Status: AC
  Administered 2024-08-13: 2 g via INTRAVENOUS

## 2024-08-13 MED ORDER — EPHEDRINE SULFATE-NACL 50-0.9 MG/10ML-% IV SOSY
PREFILLED_SYRINGE | INTRAVENOUS | Status: DC | PRN
  Administered 2024-08-13: 5 mg via INTRAVENOUS

## 2024-08-13 MED ORDER — MIDAZOLAM HCL 2 MG/2ML IJ SOLN
INTRAMUSCULAR | Status: DC | PRN
  Administered 2024-08-13: 1 mg via INTRAVENOUS
  Administered 2024-08-13: .5 mg via INTRAVENOUS

## 2024-08-13 MED ORDER — ACETAMINOPHEN 10 MG/ML IV SOLN
INTRAVENOUS | Status: AC
  Filled 2024-08-13: qty 100

## 2024-08-13 MED ORDER — EPHEDRINE 5 MG/ML INJ
INTRAVENOUS | Status: AC
  Filled 2024-08-13: qty 5

## 2024-08-13 MED ORDER — FENTANYL CITRATE (PF) 100 MCG/2ML IJ SOLN
INTRAMUSCULAR | Status: DC | PRN
  Administered 2024-08-13 (×2): 25 ug via INTRAVENOUS

## 2024-08-13 MED ORDER — BUPIVACAINE-EPINEPHRINE 0.5% -1:200000 IJ SOLN
INTRAMUSCULAR | Status: DC | PRN
  Administered 2024-08-13: 4 mL

## 2024-08-13 MED ORDER — CHLORHEXIDINE GLUCONATE 0.12 % MT SOLN
OROMUCOSAL | Status: AC
  Filled 2024-08-13: qty 15

## 2024-08-13 MED ORDER — ONDANSETRON HCL 4 MG/2ML IJ SOLN
INTRAMUSCULAR | Status: AC
  Filled 2024-08-13: qty 2

## 2024-08-13 MED ORDER — MIDAZOLAM HCL 2 MG/2ML IJ SOLN
INTRAMUSCULAR | Status: AC
  Filled 2024-08-13: qty 2

## 2024-08-13 MED ORDER — PROPOFOL 500 MG/50ML IV EMUL
INTRAVENOUS | Status: DC | PRN
  Administered 2024-08-13: 100 ug/kg/min via INTRAVENOUS

## 2024-08-13 MED ORDER — FENTANYL CITRATE (PF) 100 MCG/2ML IJ SOLN
INTRAMUSCULAR | Status: AC
  Filled 2024-08-13: qty 2

## 2024-08-13 MED ORDER — OXYCODONE HCL 5 MG PO TABS: 5.0000 mg | ORAL_TABLET | Freq: Once | ORAL | Status: DC | PRN

## 2024-08-13 MED ORDER — PROPOFOL 1000 MG/100ML IV EMUL
INTRAVENOUS | Status: AC
  Filled 2024-08-13: qty 100

## 2024-08-13 MED ORDER — ORAL CARE MOUTH RINSE: 15.0000 mL | Freq: Once | OROMUCOSAL | Status: AC

## 2024-08-13 MED ORDER — ACETAMINOPHEN 10 MG/ML IV SOLN
INTRAVENOUS | Status: DC | PRN
  Administered 2024-08-13: 600 mg via INTRAVENOUS

## 2024-08-13 MED ORDER — CHLORHEXIDINE GLUCONATE 0.12 % MT SOLN
15.0000 mL | Freq: Once | OROMUCOSAL | Status: AC
  Administered 2024-08-13: 15 mL via OROMUCOSAL

## 2024-08-13 MED ORDER — BUPIVACAINE-EPINEPHRINE (PF) 0.5% -1:200000 IJ SOLN
INTRAMUSCULAR | Status: AC
  Filled 2024-08-13: qty 10

## 2024-08-13 MED ORDER — DEXAMETHASONE SODIUM PHOSPHATE 10 MG/ML IJ SOLN
INTRAMUSCULAR | Status: AC
  Filled 2024-08-13: qty 1

## 2024-08-13 SURGICAL SUPPLY — 21 items
CHLORAPREP W/TINT 26 (MISCELLANEOUS) IMPLANT
DERMABOND ADVANCED .7 DNX12 (GAUZE/BANDAGES/DRESSINGS) ×1 IMPLANT
DRAPE LAPAROTOMY 100X77 ABD (DRAPES) ×1 IMPLANT
ELECTRODE REM PT RTRN 9FT ADLT (ELECTROSURGICAL) ×1 IMPLANT
GLOVE BIO SURGEON STRL SZ 6.5 (GLOVE) ×1 IMPLANT
GLOVE BIOGEL PI IND STRL 6.5 (GLOVE) ×1 IMPLANT
GLOVE SURG SYN 6.5 PF PI (GLOVE) ×2 IMPLANT
GOWN STRL REUS W/ TWL LRG LVL3 (GOWN DISPOSABLE) ×3 IMPLANT
KIT TURNOVER KIT A (KITS) ×1 IMPLANT
LABEL OR SOLS (LABEL) ×1 IMPLANT
MANIFOLD NEPTUNE II (INSTRUMENTS) ×1 IMPLANT
NDL HYPO 25X1 1.5 SAFETY (NEEDLE) ×1 IMPLANT
NEEDLE HYPO 25X1 1.5 SAFETY (NEEDLE) ×1 IMPLANT
NS IRRIG 500ML POUR BTL (IV SOLUTION) ×1 IMPLANT
PACK BASIN MINOR ARMC (MISCELLANEOUS) ×1 IMPLANT
SUT ETHILON 3-0 (SUTURE) IMPLANT
SUT VIC AB 3-0 SH 27X BRD (SUTURE) ×1 IMPLANT
SUTURE MNCRL 4-0 27XMF (SUTURE) ×1 IMPLANT
SYR 10ML LL (SYRINGE) ×1 IMPLANT
TRAP FLUID SMOKE EVACUATOR (MISCELLANEOUS) ×1 IMPLANT
WATER STERILE IRR 500ML POUR (IV SOLUTION) ×1 IMPLANT

## 2024-08-13 NOTE — Op Note (Signed)
 OPERATION REPORT  Pre Operative Diagnosis: Buttock cyst  Post operative diagnosis: Same  Anesthesia: MAC and Local   Surgeon: Dr. Rodolph   Indication: This 78 y.o. year old female with a soft tissue mass on her buttoc/coccyx area that is causing pain and discomfort.    Description of procedure: after orienting patient about the procedure steps and benefits and patient agreed to proceed. Time out was done identifying correct patient and location of procedure. After induction of monitored sedation, local anesthesia was infiltrated around the palpable lesion. With a blade #15, an elliptical incision was made using the skin lines. Sharp dissection was carried down and lesion was excised including dermal tissue. The mass measured 3.1 cm. Deep dermal stitches were done with vicryl 4-0 to repair the laceration and skin closed with Monocryl 4-0 in subcuticular fashion. Specimen sent to pathology.    Complications: none   EBL: minimal  Lucas Rodolph, MD, FACS

## 2024-08-13 NOTE — Anesthesia Preprocedure Evaluation (Addendum)
 Anesthesia Evaluation  Patient identified by MRN, date of birth, ID band Patient awake    Reviewed: Allergy & Precautions, NPO status , Patient's Chart, lab work & pertinent test results  History of Anesthesia Complications (+) PONV and history of anesthetic complications  Airway Mallampati: III  TM Distance: <3 FB Neck ROM: full    Dental  (+) Chipped, Poor Dentition, Partial Upper, Missing   Pulmonary shortness of breath and with exertion, COPD, Current Smoker and Patient abstained from smoking.   Pulmonary exam normal        Cardiovascular hypertension, (-) angina + CAD and + Peripheral Vascular Disease  + dysrhythmias Atrial Fibrillation      Neuro/Psych CVA  negative psych ROS   GI/Hepatic Neg liver ROS,GERD  Controlled,,  Endo/Other  negative endocrine ROS    Renal/GU negative Renal ROS  negative genitourinary   Musculoskeletal   Abdominal   Peds  Hematology  (+) Blood dyscrasia, anemia   Anesthesia Other Findings Past Medical History: No date: AAA (abdominal aortic aneurysm) (HCC)     Comment:  a.) aortic US  02/05/2020: infrarenal AAA measuring 4.2 x              4.6 cm; b.) AAA duplex 08/12/2020: measured 4.3 cm; c.)               AAA duplex 02/23/2021: measured 4.53 x 4.31 cm; d.) AAA               doppler 03/07/2022: measured 4.53 x 4.31 cm; e.) LD chest              CT 06/14/2022: measured 4.6 cm; f.) CT abd: partially               thrombosed measuring up to 5.0 cm (overread by vascular               suggested larger at 5.2-5.3 cm); g.) s/p EVAR 11/01/2022 No date: Agitation No date: Anemia 01/23/2020: 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     Comment:  a.) IR angio 01/23/2020: 8.0 x 5.6 mm wide necked               aneurysm arising from the horizontal segment of the               cavernous LEFT ICA 01/23/2020: Anterior communicating artery aneurysm     Comment:  a.) IR  angio 01/23/2020: 3.9 x 4.6 mm aneurysm at the               bifurcation of the A2/A3 segment into the pericallosal               and callosal marginal arteries; b.) s/p coli embolization              06/04/2020 No date: Aortic atherosclerosis (HCC) No date: CAD (coronary artery disease)     Comment:  a.) MV 10/31/2022: no ischemia No date: Compression fracture of L1 lumbar vertebra (HCC) No date: COPD (chronic obstructive pulmonary disease) (HCC) No date: DDD (degenerative disc disease), cervical No date: Degenerative joint disease of cervical spine No date: Diverticulosis No date: Emphysema lung (HCC) No date: Epidermoid cyst of neck No date: Eustachian tube dysfunction No date: GERD (gastroesophageal reflux disease) No date: HLD (hyperlipidemia) No date: HTN (hypertension) No date: Hypokalemia No date: Insomnia No date: Long-term use of aspirin  therapy No date: Lung nodule No date: Meniere's disease in remission, bilateral No date: On  long term clopidogrel  therapy No date: Osteopenia No date: PAD (peripheral artery disease) (HCC)     Comment:  a.) s/p PTA fo LEFT CFA and SFA 11/02/2022; b.) s/p LEFT              femoral endarterectomy 11/03/2022 No date: PAF (paroxysmal atrial fibrillation) (HCC) 01/23/2020: 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)     Comment:  a.) IR angio 01/23/2020: 6.2 x 6.1 x 8.9 ruptured               aneurysm arising from the origion of the RIGHT PCA               projecting laterally --> coil embolization performed               01/23/2020 01/23/2020: 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 (HCC)     Comment:  a.) CT head 01/23/2020: extensive SAH about the RIGHT               cerebral hemisphere and basal cisterns secondary to RIGHT              PCA aneurysm rupture No date: Tobacco abuse No date: Unintentional weight loss     Comment:  a.) reports eating well; eats 4-5 x/day and  still feels               hungry; b.) weight decreasing: 104 lb (02/2021), 88 lb               (02/2022), 86 lb (08/2022), 83 lb (10/2022), 82 lb               (07/2023) No date: Vertigo  Past Surgical History: 10/2022: ABDOMINAL AORTIC ANEURYSM REPAIR 2021: BRAIN SURGERY No date: CATARACT EXTRACTION W/ INTRAOCULAR LENS  IMPLANT, BILATERAL 08/10/2023: CYST EXCISION; N/A     Comment:  Procedure: CYST REMOVAL;  Surgeon: Rodolph Romano, MD;  Location: [facility] OR[facility];  Service: General;                Laterality: N/A; 11/03/2022: ENDARTERECTOMY FEMORAL; Left     Comment:  Procedure: ENDARTERECTOMY FEMORAL;  Surgeon: Marea Selinda RAMAN, MD;  Location: [facility];  Service: Vascular;                Laterality: Left; 10/2022: endocardectomy femerol 11/01/2022: ENDOVASCULAR REPAIR/STENT GRAFT; N/A     Comment:  Procedure: ENDOVASCULAR REPAIR/STENT GRAFT;  Surgeon:               Marea Selinda RAMAN, MD;  Location: [facility];                Service: Cardiovascular;  Laterality: N/A; No date: ENDOVASCULAR REPAIR/STENT GRAFT No date: EYE SURGERY 01/23/2020: IR ANGIO INTRA EXTRACRAN SEL INTERNAL CAROTID BILAT MOD  SED 06/04/2020: IR ANGIO INTRA EXTRACRAN SEL INTERNAL CAROTID UNI R MOD  SED 01/23/2020: IR ANGIO VERTEBRAL SEL VERTEBRAL UNI L MOD SED 01/23/2020: IR ANGIOGRAM FOLLOW UP STUDY 01/23/2020: IR ANGIOGRAM FOLLOW UP STUDY 01/23/2020: IR ANGIOGRAM FOLLOW UP STUDY 01/23/2020: IR ANGIOGRAM FOLLOW UP STUDY 06/04/2020: IR ANGIOGRAM FOLLOW UP STUDY 06/04/2020: IR ANGIOGRAM FOLLOW UP STUDY 06/04/2020: IR NEURO EACH ADD'L AFTER BASIC UNI RIGHT (MS) 01/23/2020: IR TRANSCATH/EMBOLIZ 06/04/2020: IR TRANSCATH/EMBOLIZ 06/04/2020: IR US  GUIDE VASC ACCESS RIGHT 11/02/2022: LOWER EXTREMITY ANGIOGRAPHY;  Left     Comment:  Procedure: Lower Extremity Angiography;  Surgeon: Marea Selinda RAMAN, MD;  Location: [facility];  Service:               Cardiovascular;   Laterality: Left; 06/23/2021: MYRINGOTOMY WITH TUBE PLACEMENT; Left     Comment:  Procedure: MYRINGOTOMY WITH TUBE PLACEMENT;  Surgeon:               Juengel, Paul, MD;  Location: [facility];                Service: ENT;  Laterality: Left; 01/23/2020: RADIOLOGY WITH ANESTHESIA; N/A     Comment:  Procedure: RADIOLOGY WITH ANESTHESIA;  Surgeon:               Lanis Pupa, MD;  Location: [facility];  Service:               Radiology;  Laterality: N/A; 06/04/2020: RADIOLOGY WITH ANESTHESIA; N/A     Comment:  Procedure: Coil embolization of left ACA aneurysm;                Surgeon: Lanis Pupa, MD;  Location: [facility] OR;                Service: Radiology;  Laterality: N/A; No date: TONSILLECTOMY  BMI    Body Mass Index: 15.00 kg/m      Reproductive/Obstetrics negative OB ROS                              Anesthesia Physical Anesthesia Plan  ASA: 3  Anesthesia Plan: General   Post-op Pain Management: Toradol IV (intra-op)* and Ofirmev  IV (intra-op)*   Induction: Intravenous  PONV Risk Score and Plan: 2 and Dexamethasone , Ondansetron , Treatment may vary due to age or medical condition, Propofol  infusion and TIVA  Airway Management Planned: Nasal Cannula and Natural Airway  Additional Equipment:   Intra-op Plan:   Post-operative Plan:   Informed Consent: I have reviewed the patients History and Physical, chart, labs and discussed the procedure including the risks, benefits and alternatives for the proposed anesthesia with the patient or authorized representative who has indicated his/her understanding and acceptance.     Dental Advisory Given  Plan Discussed with: Anesthesiologist, CRNA and Surgeon  Anesthesia Plan Comments: (Patient consented for risks of anesthesia including but not limited to:  - adverse reactions to medications - damage to eyes, teeth, lips or other oral mucosa - nerve damage due to positioning  - sore throat  or hoarseness - Damage to heart, brain, nerves, lungs, other parts of body or loss of life  Patient voiced understanding and assent.)        Anesthesia Quick Evaluation

## 2024-08-13 NOTE — Anesthesia Postprocedure Evaluation (Signed)
 Anesthesia Post Note  Patient: Autumn Le  Procedure(s) Performed: EXCISION, MASS, TORSO (Buttocks)  Patient location during evaluation: PACU Anesthesia Type: MAC Level of consciousness: awake and alert Pain management: pain level controlled Vital Signs Assessment: post-procedure vital signs reviewed and stable Respiratory status: spontaneous breathing, nonlabored ventilation, respiratory function stable and patient connected to nasal cannula oxygen Cardiovascular status: blood pressure returned to baseline and stable Postop Assessment: no apparent nausea or vomiting Anesthetic complications: no   No notable events documented.   Last Vitals:  Vitals:   08/13/24 0917 08/13/24 0930  BP: 120/60 (!) 105/59  Pulse: 70 72  Resp: 14 18  Temp: (!) 36.4 C   SpO2: 100% 100%    Last Pain:  Vitals:   08/13/24 0930  TempSrc:   PainSc: 0-No pain                 [name]

## 2024-08-13 NOTE — Interval H&P Note (Signed)
 History and Physical Interval Note:  08/13/2024 8:22 AM  Autumn Le Knee  has presented today for surgery, with the diagnosis of L72.0 epidermal inclusion cyst.  The various methods of treatment have been discussed with the patient and family. After consideration of risks, benefits and other options for treatment, the patient has consented to  Procedure(s) with comments: EXCISION, MASS, TORSO (N/A) - lower back/buttock area as a surgical intervention.  The patient's history has been reviewed, patient examined, no change in status, stable for surgery.  I have reviewed the patient's chart and labs.  Questions were answered to the patient's satisfaction.     Lucas Sjogren

## 2024-08-13 NOTE — Discharge Instructions (Signed)

## 2024-08-13 NOTE — Transfer of Care (Signed)
 Immediate Anesthesia Transfer of Care Note  Patient: Autumn Le  Procedure(s) Performed: EXCISION, MASS, TORSO (Buttocks)  Patient Location: PACU  Anesthesia Type:MAC  Level of Consciousness: drowsy  Airway & Oxygen Therapy: Patient Spontanous Breathing and Patient connected to face mask oxygen  Post-op Assessment: Report given to RN and Post -op Vital signs reviewed and stable  Post vital signs: Reviewed and stable  Last Vitals:  Vitals Value Taken Time  BP 120/60 08/13/24 09:16  Temp 35.9 0916  Pulse 66 08/13/24 09:18  Resp 14 08/13/24 09:18  SpO2 100 % 08/13/24 09:18  Vitals shown include unfiled device data.  Last Pain:  Vitals:   08/13/24 0733  TempSrc: Temporal  PainSc: 0-No pain         Complications: No notable events documented.

## 2024-08-14 ENCOUNTER — Encounter: Payer: Self-pay | Admitting: General Surgery

## 2024-08-15 LAB — SURGICAL PATHOLOGY

## 2024-12-03 ENCOUNTER — Encounter: Payer: Self-pay | Admitting: Acute Care

## 2025-03-03 ENCOUNTER — Other Ambulatory Visit (INDEPENDENT_AMBULATORY_CARE_PROVIDER_SITE_OTHER)

## 2025-03-03 ENCOUNTER — Ambulatory Visit (INDEPENDENT_AMBULATORY_CARE_PROVIDER_SITE_OTHER): Admitting: Vascular Surgery

## 2025-03-03 ENCOUNTER — Encounter (INDEPENDENT_AMBULATORY_CARE_PROVIDER_SITE_OTHER)
